# Patient Record
Sex: Female | Born: 1950 | Race: Black or African American | Hispanic: No | Marital: Married | Smoking: Former smoker
Health system: Southern US, Community
[De-identification: ages and names within clinical notes are randomized; demographics above are authoritative.]

## PROBLEM LIST (undated history)

## (undated) DIAGNOSIS — Z923 Personal history of irradiation: Secondary | ICD-10-CM

## (undated) DIAGNOSIS — I10 Essential (primary) hypertension: Secondary | ICD-10-CM

## (undated) DIAGNOSIS — N6091 Unspecified benign mammary dysplasia of right breast: Secondary | ICD-10-CM

## (undated) DIAGNOSIS — E119 Type 2 diabetes mellitus without complications: Secondary | ICD-10-CM

## (undated) DIAGNOSIS — C801 Malignant (primary) neoplasm, unspecified: Secondary | ICD-10-CM

## (undated) DIAGNOSIS — Z8719 Personal history of other diseases of the digestive system: Secondary | ICD-10-CM

## (undated) DIAGNOSIS — R51 Headache: Secondary | ICD-10-CM

## (undated) DIAGNOSIS — R519 Headache, unspecified: Secondary | ICD-10-CM

## (undated) DIAGNOSIS — D369 Benign neoplasm, unspecified site: Secondary | ICD-10-CM

## (undated) DIAGNOSIS — N8500 Endometrial hyperplasia, unspecified: Secondary | ICD-10-CM

## (undated) DIAGNOSIS — M1990 Unspecified osteoarthritis, unspecified site: Secondary | ICD-10-CM

## (undated) DIAGNOSIS — K635 Polyp of colon: Secondary | ICD-10-CM

## (undated) DIAGNOSIS — K760 Fatty (change of) liver, not elsewhere classified: Secondary | ICD-10-CM

## (undated) DIAGNOSIS — K219 Gastro-esophageal reflux disease without esophagitis: Secondary | ICD-10-CM

## (undated) DIAGNOSIS — Z9889 Other specified postprocedural states: Secondary | ICD-10-CM

## (undated) HISTORY — DX: Personal history of other diseases of the digestive system: Z87.19

## (undated) HISTORY — DX: Type 2 diabetes mellitus without complications: E11.9

## (undated) HISTORY — DX: Polyp of colon: K63.5

## (undated) HISTORY — PX: DILATION AND CURETTAGE OF UTERUS: SHX78

## (undated) HISTORY — DX: Unspecified benign mammary dysplasia of right breast: N60.91

## (undated) HISTORY — DX: Other specified postprocedural states: Z98.890

## (undated) HISTORY — DX: Benign neoplasm, unspecified site: D36.9

---

## 2006-12-25 DIAGNOSIS — Z8719 Personal history of other diseases of the digestive system: Secondary | ICD-10-CM

## 2006-12-25 DIAGNOSIS — K635 Polyp of colon: Secondary | ICD-10-CM

## 2006-12-25 HISTORY — PX: COLONOSCOPY: SHX174

## 2006-12-25 HISTORY — DX: Personal history of other diseases of the digestive system: Z87.19

## 2006-12-25 HISTORY — DX: Polyp of colon: K63.5

## 2007-07-25 LAB — HM COLONOSCOPY

## 2008-06-25 ENCOUNTER — Other Ambulatory Visit: Admission: RE | Admit: 2008-06-25 | Discharge: 2008-06-25 | Payer: Self-pay | Admitting: Obstetrics and Gynecology

## 2008-07-08 ENCOUNTER — Encounter: Admission: RE | Admit: 2008-07-08 | Discharge: 2008-07-08 | Payer: Self-pay | Admitting: Obstetrics and Gynecology

## 2010-04-07 ENCOUNTER — Ambulatory Visit: Payer: Self-pay | Admitting: Family Medicine

## 2011-05-04 ENCOUNTER — Ambulatory Visit: Payer: Self-pay

## 2011-12-26 DIAGNOSIS — N8500 Endometrial hyperplasia, unspecified: Secondary | ICD-10-CM

## 2011-12-26 HISTORY — DX: Endometrial hyperplasia, unspecified: N85.00

## 2012-04-23 LAB — HM DIABETES EYE EXAM

## 2012-05-06 ENCOUNTER — Ambulatory Visit: Payer: Self-pay | Admitting: Obstetrics and Gynecology

## 2012-07-24 LAB — HM PAP SMEAR: HM Pap smear: NORMAL

## 2012-07-24 LAB — HM MAMMOGRAPHY: HM Mammogram: NORMAL

## 2012-12-24 LAB — HM DIABETES FOOT EXAM

## 2013-02-24 ENCOUNTER — Ambulatory Visit: Payer: Self-pay | Admitting: Internal Medicine

## 2013-06-23 LAB — HEMOGLOBIN A1C: Hgb A1c MFr Bld: 6.5 % — AB (ref 4.0–6.0)

## 2013-07-12 LAB — HM MAMMOGRAPHY: HM Mammogram: NORMAL

## 2013-07-24 ENCOUNTER — Encounter: Payer: Self-pay | Admitting: Gastroenterology

## 2013-07-24 ENCOUNTER — Encounter: Payer: Self-pay | Admitting: *Deleted

## 2013-07-24 ENCOUNTER — Encounter: Payer: Self-pay | Admitting: Internal Medicine

## 2013-07-24 ENCOUNTER — Ambulatory Visit (INDEPENDENT_AMBULATORY_CARE_PROVIDER_SITE_OTHER): Payer: No Typology Code available for payment source | Admitting: Internal Medicine

## 2013-07-24 VITALS — BP 120/60 | HR 77 | Temp 98.1°F | Ht 62.5 in | Wt 211.0 lb

## 2013-07-24 DIAGNOSIS — E669 Obesity, unspecified: Secondary | ICD-10-CM

## 2013-07-24 DIAGNOSIS — R0683 Snoring: Secondary | ICD-10-CM

## 2013-07-24 DIAGNOSIS — M722 Plantar fascial fibromatosis: Secondary | ICD-10-CM | POA: Insufficient documentation

## 2013-07-24 DIAGNOSIS — E1121 Type 2 diabetes mellitus with diabetic nephropathy: Secondary | ICD-10-CM | POA: Insufficient documentation

## 2013-07-24 DIAGNOSIS — R0989 Other specified symptoms and signs involving the circulatory and respiratory systems: Secondary | ICD-10-CM

## 2013-07-24 DIAGNOSIS — R1314 Dysphagia, pharyngoesophageal phase: Secondary | ICD-10-CM

## 2013-07-24 DIAGNOSIS — Z9889 Other specified postprocedural states: Secondary | ICD-10-CM

## 2013-07-24 DIAGNOSIS — M791 Myalgia, unspecified site: Secondary | ICD-10-CM | POA: Insufficient documentation

## 2013-07-24 DIAGNOSIS — I10 Essential (primary) hypertension: Secondary | ICD-10-CM

## 2013-07-24 DIAGNOSIS — IMO0001 Reserved for inherently not codable concepts without codable children: Secondary | ICD-10-CM

## 2013-07-24 DIAGNOSIS — E785 Hyperlipidemia, unspecified: Secondary | ICD-10-CM | POA: Insufficient documentation

## 2013-07-24 DIAGNOSIS — R0609 Other forms of dyspnea: Secondary | ICD-10-CM

## 2013-07-24 DIAGNOSIS — E119 Type 2 diabetes mellitus without complications: Secondary | ICD-10-CM

## 2013-07-24 DIAGNOSIS — Z8719 Personal history of other diseases of the digestive system: Secondary | ICD-10-CM

## 2013-07-24 DIAGNOSIS — T466X5A Adverse effect of antihyperlipidemic and antiarteriosclerotic drugs, initial encounter: Secondary | ICD-10-CM | POA: Insufficient documentation

## 2013-07-24 LAB — COMPREHENSIVE METABOLIC PANEL
ALT: 13 U/L (ref 0–35)
AST: 16 U/L (ref 0–37)
Albumin: 3.9 g/dL (ref 3.5–5.2)
Alkaline Phosphatase: 97 U/L (ref 39–117)
BUN: 21 mg/dL (ref 6–23)
CO2: 30 mEq/L (ref 19–32)
Calcium: 10 mg/dL (ref 8.4–10.5)
Chloride: 103 mEq/L (ref 96–112)
Creatinine, Ser: 1 mg/dL (ref 0.4–1.2)
GFR: 75.46 mL/min (ref >60.00)
Glucose, Bld: 100 mg/dL — ABNORMAL HIGH (ref 70–99)
Potassium: 4.5 mEq/L (ref 3.5–5.1)
Sodium: 139 mEq/L (ref 135–145)
Total Bilirubin: 0.4 mg/dL (ref 0.3–1.2)
Total Protein: 7.1 g/dL (ref 6.0–8.3)

## 2013-07-24 LAB — CK: Total CK: 72 U/L (ref 7–177)

## 2013-07-24 LAB — HEMOGLOBIN A1C: Hgb A1c MFr Bld: 6.6 % — ABNORMAL HIGH (ref 4.6–6.5)

## 2013-07-24 LAB — LIPID PANEL
Cholesterol: 169 mg/dL (ref 0–200)
HDL: 58.9 mg/dL (ref >39.00)
LDL Cholesterol: 95 mg/dL (ref 0–99)
Total CHOL/HDL Ratio: 3
Triglycerides: 78 mg/dL (ref 0.0–149.0)
VLDL: 15.6 mg/dL (ref 0.0–40.0)

## 2013-07-24 MED ORDER — METFORMIN HCL 500 MG PO TABS: 500.0000 mg | ORAL_TABLET | Freq: Two times a day (BID) | ORAL | Status: DC

## 2013-07-24 MED ORDER — LISINOPRIL-HYDROCHLOROTHIAZIDE 20-12.5 MG PO TABS: 1.0000 | ORAL_TABLET | Freq: Every day | ORAL | Status: DC

## 2013-07-24 MED ORDER — SIMVASTATIN 40 MG PO TABS: 40.0000 mg | ORAL_TABLET | Freq: Every evening | ORAL | Status: DC

## 2013-07-24 NOTE — Assessment & Plan Note (Signed)
Pt reports good control of BG. Will check A1c with labs today. Continue Metformin. Eye exam and foot exam UTD.

## 2013-07-24 NOTE — Assessment & Plan Note (Signed)
Lipids are well controlled on simvastatin. We'll continue.

## 2013-07-24 NOTE — Assessment & Plan Note (Signed)
Symptoms of plantar fasciitis in the right foot persistent despite conservative measures including nonsteroidal medications and icing her foot. Will set up podiatry evaluation. Question if she might benefit from steroid injection.

## 2013-07-24 NOTE — Assessment & Plan Note (Signed)
BP Readings from Last 3 Encounters:  07/24/13 120/60   Blood pressure well-controlled on current medication. Will continue.

## 2013-07-24 NOTE — Assessment & Plan Note (Signed)
Body mass index is 37.95 kg/(m^2).  Discussed healthy diet and increasing physical activity with goal of 30-40 min 3 days per week. Activity currently limited by foot pain. Encouraged water-based activity.

## 2013-07-24 NOTE — Patient Instructions (Signed)

## 2013-07-24 NOTE — Assessment & Plan Note (Signed)
Persistent symptoms of dysphasia despite esophageal dilation several years ago. Previous dilation was performed in [place]. Will set her up with local GI physician. Question if she will need repeat EGD and possibly repeat esophageal dilation.

## 2013-07-24 NOTE — Progress Notes (Signed)
Subjective:    Patient ID: Martha [name], female    DOB: 1951/10/27, 62 y.o.   MRN: [id number]  HPI 62 year old female with history of diabetes, hypertension, hyperlipidemia, plantar fasciitis presents to establish care. In regards to diabetes, she reports blood sugars have been well-controlled typically between 90 and 100 fasting. Recent A1c checked by her former physician was 6.5%. She is compliant with metformin. She typically takes her medication only once per day.  Her primary concern today is ongoing right heel pain. This was diagnosed as plantar fasciitis. She has been taking Naprosyn and icing her foot with no improvement. The pain in her foot is described as a sharp knifelike pain that occurs with any weightbearing. It limits her ability to exercise.  She also notes occasional aching in the joints of her knees and shoulders. This seems to be worse with physical activity. She does not take any medication on a regular basis for this.  Outpatient Encounter Prescriptions as of 07/24/2013  Medication Sig Dispense Refill  . aspirin 81 MG tablet Take 81 mg by mouth daily.      . Cholecalciferol (VITAMIN D-3 PO) Take 2,000 Int'l Units by mouth daily.      . fluticasone (FLONASE) 50 MCG/ACT nasal spray       . lisinopril-hydrochlorothiazide (PRINZIDE,ZESTORETIC) 20-12.5 MG per tablet Take 1 tablet by mouth daily.  90 tablet  4  . metFORMIN (GLUCOPHAGE) 500 MG tablet Take 1 tablet (500 mg total) by mouth 2 (two) times daily with a meal.  180 tablet  4  . Naphazoline-Glycerin (REDNESS RELIEF OP) Apply to eye.      . naproxen (NAPROSYN) 500 MG tablet Take 500 mg by mouth 2 (two) times daily with a meal.      . Phenylephrine-Acetaminophen (SUDAFED PE PRESSURE + PAIN) 5-325 MG TABS Take by mouth.      . simvastatin (ZOCOR) 40 MG tablet Take 1 tablet (40 mg total) by mouth every evening.  90 tablet  4  . TRIAMCINOLONE ACETONIDE, TOP, 0.05 % OINT Apply topically.       No facility-administered  encounter medications on file as of 07/24/2013.   BP 120/60  Pulse 77  Temp(Src) 98.1 F (36.7 C) (Oral)  Ht 5' 2.5" (1.588 m)  Wt 211 lb (95.709 kg)  BMI 37.95 kg/m2  SpO2 97%  Review of Systems  Constitutional: Negative for fever, chills, appetite change, fatigue and unexpected weight change.  HENT: Negative for ear pain, congestion, sore throat, trouble swallowing, neck pain, voice change and sinus pressure.   Eyes: Negative for visual disturbance.  Respiratory: Negative for cough, shortness of breath, wheezing and stridor.   Cardiovascular: Negative for chest pain, palpitations and leg swelling.  Gastrointestinal: Negative for nausea, vomiting, abdominal pain, diarrhea, constipation, blood in stool, abdominal distention and anal bleeding.  Genitourinary: Negative for dysuria and flank pain.  Musculoskeletal: Positive for myalgias and arthralgias. Negative for gait problem.  Skin: Negative for color change and rash.  Neurological: Negative for dizziness and headaches.  Hematological: Negative for adenopathy. Does not bruise/bleed easily.  Psychiatric/Behavioral: Negative for suicidal ideas, sleep disturbance and dysphoric mood. The patient is not nervous/anxious.        Objective:   Physical Exam  Constitutional: She is oriented to person, place, and time. She appears well-developed and well-nourished. No distress.  HENT:  Head: Normocephalic and atraumatic.  Right Ear: External ear normal.  Left Ear: External ear normal.  Nose: Nose normal.  Mouth/Throat: Oropharynx is clear and moist.  No oropharyngeal exudate.  Eyes: Conjunctivae are normal. Pupils are equal, round, and reactive to light. Right eye exhibits no discharge. Left eye exhibits no discharge. No scleral icterus.  Neck: Normal range of motion. Neck supple. No tracheal deviation present. No thyromegaly present.  Cardiovascular: Normal rate, regular rhythm, normal heart sounds and intact distal pulses.  Exam reveals  no gallop and no friction rub.   No murmur heard. Pulmonary/Chest: Effort normal and breath sounds normal. No accessory muscle usage. Not tachypneic. No respiratory distress. She has no decreased breath sounds. She has no wheezes. She has no rhonchi. She has no rales. She exhibits no tenderness.  Musculoskeletal: Normal range of motion. She exhibits no edema and no tenderness.       Right foot: She exhibits tenderness. She exhibits normal range of motion and no bony tenderness.  Lymphadenopathy:    She has no cervical adenopathy.  Neurological: She is alert and oriented to person, place, and time. No cranial nerve deficit. She exhibits normal muscle tone. Coordination normal.  Skin: Skin is warm and dry. No rash noted. She is not diaphoretic. No erythema. No pallor.  Psychiatric: She has a normal mood and affect. Her behavior is normal. Judgment and thought content normal.          Assessment & Plan:

## 2013-07-24 NOTE — Assessment & Plan Note (Signed)
Reason aching in knees and shoulders. Symptoms are most consistent with osteoarthritis. Encouraged increased physical activity such as water aerobics. CK check today was normal.

## 2013-07-24 NOTE — Assessment & Plan Note (Signed)
Patient reports that her husband has noted that she snores. She also notes some daytime fatigue. Will set up sleep study to evaluate for sleep apnea.

## 2013-07-31 ENCOUNTER — Ambulatory Visit: Payer: Self-pay | Admitting: Family Medicine

## 2013-08-13 ENCOUNTER — Other Ambulatory Visit: Payer: Self-pay | Admitting: *Deleted

## 2013-08-13 DIAGNOSIS — I10 Essential (primary) hypertension: Secondary | ICD-10-CM

## 2013-08-13 DIAGNOSIS — E119 Type 2 diabetes mellitus without complications: Secondary | ICD-10-CM

## 2013-08-13 DIAGNOSIS — E785 Hyperlipidemia, unspecified: Secondary | ICD-10-CM

## 2013-08-13 MED ORDER — SIMVASTATIN 40 MG PO TABS: 40.0000 mg | ORAL_TABLET | Freq: Every evening | ORAL | Status: DC

## 2013-08-13 MED ORDER — LISINOPRIL-HYDROCHLOROTHIAZIDE 20-12.5 MG PO TABS: 1.0000 | ORAL_TABLET | Freq: Every day | ORAL | Status: DC

## 2013-08-13 MED ORDER — METFORMIN HCL 500 MG PO TABS: 500.0000 mg | ORAL_TABLET | Freq: Two times a day (BID) | ORAL | Status: DC

## 2013-08-13 NOTE — Telephone Encounter (Signed)
Eprescribed.

## 2013-08-19 ENCOUNTER — Ambulatory Visit: Payer: No Typology Code available for payment source | Admitting: Internal Medicine

## 2013-08-21 ENCOUNTER — Ambulatory Visit: Payer: No Typology Code available for payment source | Admitting: Gastroenterology

## 2014-02-08 ENCOUNTER — Other Ambulatory Visit: Payer: Self-pay | Admitting: Internal Medicine

## 2014-05-09 ENCOUNTER — Other Ambulatory Visit: Payer: Self-pay | Admitting: Internal Medicine

## 2014-05-11 NOTE — Telephone Encounter (Signed)
Spoke with pt on need for appt, scheduled 30 min visit on 06/12/14.

## 2014-06-12 ENCOUNTER — Ambulatory Visit (INDEPENDENT_AMBULATORY_CARE_PROVIDER_SITE_OTHER): Payer: No Typology Code available for payment source | Admitting: Internal Medicine

## 2014-06-12 ENCOUNTER — Encounter: Payer: Self-pay | Admitting: Internal Medicine

## 2014-06-12 VITALS — BP 106/60 | HR 73 | Temp 98.3°F | Wt 209.8 lb

## 2014-06-12 DIAGNOSIS — E669 Obesity, unspecified: Secondary | ICD-10-CM

## 2014-06-12 DIAGNOSIS — I10 Essential (primary) hypertension: Secondary | ICD-10-CM

## 2014-06-12 DIAGNOSIS — R0683 Snoring: Secondary | ICD-10-CM

## 2014-06-12 DIAGNOSIS — R0989 Other specified symptoms and signs involving the circulatory and respiratory systems: Secondary | ICD-10-CM

## 2014-06-12 DIAGNOSIS — E119 Type 2 diabetes mellitus without complications: Secondary | ICD-10-CM

## 2014-06-12 DIAGNOSIS — E785 Hyperlipidemia, unspecified: Secondary | ICD-10-CM

## 2014-06-12 DIAGNOSIS — R0609 Other forms of dyspnea: Secondary | ICD-10-CM

## 2014-06-12 LAB — COMPREHENSIVE METABOLIC PANEL
ALT: 13 U/L (ref 0–35)
AST: 19 U/L (ref 0–37)
Albumin: 4.1 g/dL (ref 3.5–5.2)
Alkaline Phosphatase: 92 U/L (ref 39–117)
BUN: 18 mg/dL (ref 6–23)
CO2: 29 mEq/L (ref 19–32)
Calcium: 9.5 mg/dL (ref 8.4–10.5)
Chloride: 104 mEq/L (ref 96–112)
Creatinine, Ser: 0.9 mg/dL (ref 0.4–1.2)
GFR: 80.04 mL/min (ref >60.00)
Glucose, Bld: 112 mg/dL — ABNORMAL HIGH (ref 70–99)
Potassium: 4.4 mEq/L (ref 3.5–5.1)
Sodium: 139 mEq/L (ref 135–145)
Total Bilirubin: 0.5 mg/dL (ref 0.2–1.2)
Total Protein: 7.1 g/dL (ref 6.0–8.3)

## 2014-06-12 LAB — LIPID PANEL
Cholesterol: 153 mg/dL (ref 0–200)
HDL: 59.5 mg/dL (ref >39.00)
LDL Cholesterol: 74 mg/dL (ref 0–99)
NonHDL: 93.5
Total CHOL/HDL Ratio: 3
Triglycerides: 98 mg/dL (ref 0.0–149.0)
VLDL: 19.6 mg/dL (ref 0.0–40.0)

## 2014-06-12 LAB — MICROALBUMIN / CREATININE URINE RATIO
Creatinine,U: 203.1 mg/dL
Microalb Creat Ratio: 3.2 mg/g (ref 0.0–30.0)
Microalb, Ur: 6.4 mg/dL — ABNORMAL HIGH (ref 0.0–1.9)

## 2014-06-12 LAB — HM DIABETES FOOT EXAM: HM Diabetic Foot Exam: NORMAL

## 2014-06-12 LAB — HEMOGLOBIN A1C: Hgb A1c MFr Bld: 6.6 % — ABNORMAL HIGH (ref 4.6–6.5)

## 2014-06-12 MED ORDER — GLUCOSE BLOOD VI STRP: ORAL_STRIP | Status: DC

## 2014-06-12 MED ORDER — SIMVASTATIN 40 MG PO TABS: 40.0000 mg | ORAL_TABLET | Freq: Every day | ORAL | Status: DC

## 2014-06-12 MED ORDER — LISINOPRIL-HYDROCHLOROTHIAZIDE 20-12.5 MG PO TABS: 1.0000 | ORAL_TABLET | Freq: Every day | ORAL | Status: DC

## 2014-06-12 MED ORDER — METFORMIN HCL 500 MG PO TABS: 500.0000 mg | ORAL_TABLET | Freq: Two times a day (BID) | ORAL | Status: DC

## 2014-06-12 NOTE — Assessment & Plan Note (Signed)
Will check lipids and LFTs with labs today. Continue simvastatin. 

## 2014-06-12 NOTE — Assessment & Plan Note (Signed)
Wt Readings from Last 3 Encounters:  06/12/14 209 lb 12 oz (95.142 kg)  07/24/13 211 lb (95.709 kg)   Body mass index is 37.73 kg/(m^2). Encouraged healthy, Mediterranean style diet and exercise 70min 3x per week at minimum to help with weight loss.

## 2014-06-12 NOTE — Progress Notes (Signed)
Pre visit review using our clinic review tool, if applicable. No additional management support is needed unless otherwise documented below in the visit note. 

## 2014-06-12 NOTE — Assessment & Plan Note (Signed)
Did not follow up with sleep study because of cost. Has high-deductible plan and cannot afford sleep study.

## 2014-06-12 NOTE — Assessment & Plan Note (Signed)
Lab Results  Component Value Date   HGBA1C 6.6* 07/24/2013   Will check A1c with labs. Continue Metformin. Encouraged healthy, Mediteranean style diet and exercise 76min 3x per week.

## 2014-06-12 NOTE — Assessment & Plan Note (Signed)
BP Readings from Last 3 Encounters:  06/12/14 106/60  07/24/13 120/60   BP well controlled on Lisinopril-HCTZ. Will check renal function with labs.

## 2014-06-12 NOTE — Progress Notes (Signed)
Subjective:    Patient ID: Martha [name], female    DOB: 01/14/1951, 63 y.o.   MRN: [id number]  HPI 63YO female presents for follow up.  DM - BG typically 130s per report but did not bring record. Compliant with meds. Working on improving diet and exercising. Frustrated by difficulty losing weight.  HTN - Does not check BP. No recent chest pain, headache, palpitations. Compliant with meds.  Recent plantar fasciitis has resolved. No new concerns today.   Review of Systems  Constitutional: Negative for fever, chills, appetite change, fatigue and unexpected weight change.  Eyes: Negative for visual disturbance.  Respiratory: Negative for shortness of breath.   Cardiovascular: Negative for chest pain and leg swelling.  Gastrointestinal: Negative for abdominal pain.  Endocrine: Negative for polydipsia, polyphagia and polyuria.  Musculoskeletal: Negative for arthralgias and myalgias.  Skin: Negative for color change and rash.  Hematological: Negative for adenopathy. Does not bruise/bleed easily.  Psychiatric/Behavioral: Negative for dysphoric mood. The patient is not nervous/anxious.        Objective:    BP 106/60  Pulse 73  Temp(Src) 98.3 F (36.8 C) (Oral)  Wt 209 lb 12 oz (95.142 kg)  SpO2 97% Physical Exam  Constitutional: She is oriented to person, place, and time. She appears well-developed and well-nourished. No distress.  HENT:  Head: Normocephalic and atraumatic.  Right Ear: External ear normal.  Left Ear: External ear normal.  Nose: Nose normal.  Mouth/Throat: Oropharynx is clear and moist. No oropharyngeal exudate.  Eyes: Conjunctivae are normal. Pupils are equal, round, and reactive to light. Right eye exhibits no discharge. Left eye exhibits no discharge. No scleral icterus.  Neck: Normal range of motion. Neck supple. No tracheal deviation present. No thyromegaly present.  Cardiovascular: Normal rate, regular rhythm, normal heart sounds and intact distal  pulses.  Exam reveals no gallop and no friction rub.   No murmur heard. Pulmonary/Chest: Effort normal and breath sounds normal. No accessory muscle usage. Not tachypneic. No respiratory distress. She has no decreased breath sounds. She has no wheezes. She has no rhonchi. She has no rales. She exhibits no tenderness.  Abdominal: Soft. Bowel sounds are normal. She exhibits no distension and no mass. There is no tenderness. There is no rebound and no guarding.  Musculoskeletal: Normal range of motion. She exhibits no edema and no tenderness.  Lymphadenopathy:    She has no cervical adenopathy.  Neurological: She is alert and oriented to person, place, and time. No cranial nerve deficit. She exhibits normal muscle tone. Coordination normal.  Skin: Skin is warm and dry. No rash noted. She is not diaphoretic. No erythema. No pallor.  Psychiatric: She has a normal mood and affect. Her behavior is normal. Judgment and thought content normal.          Assessment & Plan:   Problem List Items Addressed This Visit     Unprioritized   Diabetes mellitus type 2, controlled - Primary      Lab Results  Component Value Date   HGBA1C 6.6* 07/24/2013   Will check A1c with labs. Continue Metformin. Encouraged healthy, Mediteranean style diet and exercise 46min 3x per week.    Relevant Medications      glucose blood (BL TEST STRIP PACK) test strip      metFORMIN (GLUCOPHAGE) tablet      lisinopril-hydrochlorothiazide (PRINZIDE,ZESTORETIC) 20-12.5 MG per tablet      simvastatin (ZOCOR) tablet   Other Relevant Orders      Comprehensive metabolic panel  Hemoglobin A1c      Microalbumin / creatinine urine ratio   Essential hypertension, benign      BP Readings from Last 3 Encounters:  06/12/14 106/60  07/24/13 120/60   BP well controlled on Lisinopril-HCTZ. Will check renal function with labs.    Relevant Medications      lisinopril-hydrochlorothiazide (PRINZIDE,ZESTORETIC) 20-12.5 MG per  tablet      simvastatin (ZOCOR) tablet   Obesity, unspecified      Wt Readings from Last 3 Encounters:  06/12/14 209 lb 12 oz (95.142 kg)  07/24/13 211 lb (95.709 kg)   Body mass index is 37.73 kg/(m^2). Encouraged healthy, Mediterranean style diet and exercise 60min 3x per week at minimum to help with weight loss.    Relevant Medications      metFORMIN (GLUCOPHAGE) tablet   Other and unspecified hyperlipidemia     Will check lipids and LFTs with labs today. Continue simvastatin.    Relevant Medications      lisinopril-hydrochlorothiazide (PRINZIDE,ZESTORETIC) 20-12.5 MG per tablet      simvastatin (ZOCOR) tablet   Other Relevant Orders      Lipid panel   Snoring     Did not follow up with sleep study because of cost. Has high-deductible plan and cannot afford sleep study.        Return in about 4 weeks (around 07/10/2014) for Physical with PAP.

## 2014-06-13 ENCOUNTER — Telehealth: Payer: Self-pay | Admitting: Internal Medicine

## 2014-06-13 NOTE — Telephone Encounter (Signed)
Relevant patient education assigned to patient using Emmi. ° °

## 2014-06-15 ENCOUNTER — Encounter: Payer: Self-pay | Admitting: *Deleted

## 2014-06-17 LAB — HM DIABETES EYE EXAM

## 2014-07-01 ENCOUNTER — Other Ambulatory Visit: Payer: Self-pay | Admitting: *Deleted

## 2014-07-01 ENCOUNTER — Telehealth: Payer: Self-pay | Admitting: Internal Medicine

## 2014-07-01 MED ORDER — GLUCOSE BLOOD VI STRP: ORAL_STRIP | Status: DC

## 2014-07-01 MED ORDER — FREESTYLE SYSTEM KIT: 1.0000 | PACK | Status: DC | PRN

## 2014-07-01 NOTE — Telephone Encounter (Signed)
Needing a prescription called into the pharmacy for a freestyle meter.

## 2014-07-01 NOTE — Telephone Encounter (Signed)
Rx sent 

## 2014-07-17 ENCOUNTER — Encounter: Payer: Self-pay | Admitting: Internal Medicine

## 2014-07-17 ENCOUNTER — Ambulatory Visit (INDEPENDENT_AMBULATORY_CARE_PROVIDER_SITE_OTHER): Payer: No Typology Code available for payment source | Admitting: Internal Medicine

## 2014-07-17 VITALS — BP 132/62 | HR 79 | Temp 98.5°F | Ht 62.5 in | Wt 209.8 lb

## 2014-07-17 DIAGNOSIS — E669 Obesity, unspecified: Secondary | ICD-10-CM

## 2014-07-17 DIAGNOSIS — Z0000 Encounter for general adult medical examination without abnormal findings: Secondary | ICD-10-CM | POA: Insufficient documentation

## 2014-07-17 NOTE — Assessment & Plan Note (Signed)
General medical exam normal today including breast and pelvic exam. PAP pending. Encouraged healthy diet, exercise with goal of 42min 3x per week. Immunizations are UTD(pt is checking on previous Zostavax). Labs reviewed including CMP, lipids, A1c.

## 2014-07-17 NOTE — Patient Instructions (Signed)

## 2014-07-17 NOTE — Progress Notes (Signed)
Pre visit review using our clinic review tool, if applicable. No additional management support is needed unless otherwise documented below in the visit note. 

## 2014-07-17 NOTE — Assessment & Plan Note (Signed)
Wt Readings from Last 3 Encounters:  07/17/14 209 lb 12 oz (95.142 kg)  06/12/14 209 lb 12 oz (95.142 kg)  07/24/13 211 lb (95.709 kg)   Body mass index is 37.73 kg/(m^2). Encouraged healthy diet and exercise with goal of 72min 3x per week.

## 2014-07-17 NOTE — Progress Notes (Signed)
Subjective:    Patient ID: Martha [name], female    DOB: 05-Dec-1951, 63 y.o.   MRN: [id number]  HPI 63YO female presents for annual exam. Feeling well. Working on improving diet and being more active. No concerns today.  Review of Systems  Constitutional: Negative for fever, chills, appetite change, fatigue and unexpected weight change.  Eyes: Negative for visual disturbance.  Respiratory: Negative for shortness of breath.   Cardiovascular: Negative for chest pain and leg swelling.  Gastrointestinal: Negative for nausea, vomiting, abdominal pain, diarrhea, constipation and rectal pain.  Genitourinary: Negative for urgency, frequency and pelvic pain.  Skin: Negative for color change and rash.  Hematological: Negative for adenopathy. Does not bruise/bleed easily.  Psychiatric/Behavioral: Negative for dysphoric mood. The patient is not nervous/anxious.        Objective:    BP 132/62  Pulse 79  Temp(Src) 98.5 F (36.9 C) (Oral)  Ht 5' 2.5" (1.588 m)  Wt 209 lb 12 oz (95.142 kg)  BMI 37.73 kg/m2  SpO2 96% Physical Exam  Constitutional: She is oriented to person, place, and time. She appears well-developed and well-nourished. No distress.  HENT:  Head: Normocephalic and atraumatic.  Right Ear: External ear normal.  Left Ear: External ear normal.  Nose: Nose normal.  Mouth/Throat: Oropharynx is clear and moist. No oropharyngeal exudate.  Eyes: Conjunctivae are normal. Pupils are equal, round, and reactive to light. Right eye exhibits no discharge. Left eye exhibits no discharge. No scleral icterus.  Neck: Normal range of motion. Neck supple. No tracheal deviation present. No thyromegaly present.  Cardiovascular: Normal rate, regular rhythm, normal heart sounds and intact distal pulses.  Exam reveals no gallop and no friction rub.   No murmur heard. Pulmonary/Chest: Effort normal and breath sounds normal. No respiratory distress. She has no wheezes. She has no rales. She  exhibits no tenderness.  Abdominal: Soft. Bowel sounds are normal. She exhibits no distension and no mass. There is no tenderness. There is no rebound and no guarding.  Genitourinary: Rectum normal, vagina normal and uterus normal. No breast swelling, tenderness, discharge or bleeding. Pelvic exam was performed with patient supine. There is no rash, tenderness or lesion on the right labia. There is no rash, tenderness or lesion on the left labia. Uterus is not enlarged and not tender. Cervix exhibits no motion tenderness, no discharge and no friability. Right adnexum displays no mass, no tenderness and no fullness. Left adnexum displays no mass, no tenderness and no fullness. No erythema or tenderness around the vagina. No vaginal discharge found.  Musculoskeletal: Normal range of motion. She exhibits no edema and no tenderness.  Lymphadenopathy:    She has no cervical adenopathy.  Neurological: She is alert and oriented to person, place, and time. No cranial nerve deficit. She exhibits normal muscle tone. Coordination normal.  Skin: Skin is warm and dry. No rash noted. She is not diaphoretic. No erythema. No pallor.  Psychiatric: She has a normal mood and affect. Her behavior is normal. Judgment and thought content normal.          Assessment & Plan:   Problem List Items Addressed This Visit     Unprioritized   Obesity, unspecified      Wt Readings from Last 3 Encounters:  07/17/14 209 lb 12 oz (95.142 kg)  06/12/14 209 lb 12 oz (95.142 kg)  07/24/13 211 lb (95.709 kg)   Body mass index is 37.73 kg/(m^2). Encouraged healthy diet and exercise with goal of 59min 3x  per week.    Routine general medical examination at a health care facility - Primary     General medical exam normal today including breast and pelvic exam. PAP pending. Encouraged healthy diet, exercise with goal of 68min 3x per week. Immunizations are UTD(pt is checking on previous Zostavax). Labs reviewed including CMP,  lipids, A1c.        Return in about 3 months (around 10/17/2014) for Recheck of Diabetes.

## 2014-07-18 LAB — HM PAP SMEAR: HM Pap smear: NORMAL

## 2014-07-20 ENCOUNTER — Other Ambulatory Visit (HOSPITAL_COMMUNITY)
Admission: RE | Admit: 2014-07-20 | Discharge: 2014-07-20 | Disposition: A | Discharge status: Home / Self Care | Payer: No Typology Code available for payment source | Source: Ambulatory Visit | Attending: Internal Medicine | Admitting: Internal Medicine

## 2014-07-20 DIAGNOSIS — Z1151 Encounter for screening for human papillomavirus (HPV): Secondary | ICD-10-CM | POA: Insufficient documentation

## 2014-07-20 DIAGNOSIS — Z01419 Encounter for gynecological examination (general) (routine) without abnormal findings: Secondary | ICD-10-CM | POA: Insufficient documentation

## 2014-07-20 LAB — HM PAP SMEAR: HM Pap smear: NEGATIVE

## 2014-07-20 NOTE — Addendum Note (Signed)
Addended by: Karlene Einstein D on: 07/20/2014 10:44 AM   Modules accepted: Orders

## 2014-07-22 LAB — CYTOLOGY - PAP

## 2014-10-12 ENCOUNTER — Ambulatory Visit: Payer: Self-pay | Admitting: Internal Medicine

## 2014-10-12 ENCOUNTER — Encounter: Payer: Self-pay | Admitting: *Deleted

## 2014-10-12 LAB — HM MAMMOGRAPHY: HM Mammogram: NEGATIVE

## 2015-01-04 ENCOUNTER — Telehealth: Payer: Self-pay | Admitting: *Deleted

## 2015-01-04 NOTE — Telephone Encounter (Signed)
I would recommend that she go to urgent care this afternoon, as we have no openings here.

## 2015-01-04 NOTE — Telephone Encounter (Signed)
Nurse Assessment Nurse: Loletta Specter, RN, Wells Guiles Date/Time Eilene Ghazi Time): 01/04/2015 12:24:51 PM Confirm and document reason for call. If symptomatic, describe symptoms. ---Caller states she has a chest cold and now it is in her head. She has been running a low grade temp. Pt has a cough, temp last night 99. Unknown this morning, denies SOB but can hear wheeze. Has the patient traveled out of the country within the last 30 days? ---Not Applicable Does the patient require triage? ---Yes Related visit to physician within the last 2 weeks? ---No Does the PT have any chronic conditions? (i.e. diabetes, asthma, etc.) ---No Guidelines Guideline Title Affirmed Question Affirmed Notes Nurse Date/Time Eilene Ghazi Time) Cough - Acute Productive Wheezing is present Loletta Specter, Automotive engineer 01/04/2015 12:26:13 PM Disp. Time Eilene Ghazi Time) Disposition Final User 01/04/2015 12:56:40 PM See Physician within 4 Hours (or PCP triage) Yes Loletta Specter, RN, Romualdo Bolk Understands: Yes Disagree/Comply: Comply Care Advice Given Per Guideline SEE PHYSICIAN WITHIN 4 HOURS (or PCP triage): * IF NO PCP TRIAGE: You need to be seen. Go to _______________ ([facility]/ [facility] or office if it will be open) within the next 3 or 4 hours. Go sooner if you become worse. CARE ADVICE given per Cough - Acute Productive (Adult) guideline. CALL BACK IF: * You become worse.

## 2015-01-04 NOTE — Telephone Encounter (Signed)
Pt notified and  verbalized understanding to be seen in [facility] or [facility]

## 2015-01-18 ENCOUNTER — Encounter: Payer: Self-pay | Admitting: Internal Medicine

## 2015-01-18 ENCOUNTER — Ambulatory Visit (INDEPENDENT_AMBULATORY_CARE_PROVIDER_SITE_OTHER): Payer: 59 | Admitting: Internal Medicine

## 2015-01-18 VITALS — BP 104/67 | HR 71 | Temp 97.9°F | Ht 62.5 in

## 2015-01-18 DIAGNOSIS — E785 Hyperlipidemia, unspecified: Secondary | ICD-10-CM

## 2015-01-18 DIAGNOSIS — I10 Essential (primary) hypertension: Secondary | ICD-10-CM

## 2015-01-18 DIAGNOSIS — E119 Type 2 diabetes mellitus without complications: Secondary | ICD-10-CM

## 2015-01-18 LAB — COMPREHENSIVE METABOLIC PANEL
ALT: 11 U/L (ref 0–35)
AST: 15 U/L (ref 0–37)
Albumin: 3.9 g/dL (ref 3.5–5.2)
Alkaline Phosphatase: 101 U/L (ref 39–117)
BUN: 19 mg/dL (ref 6–23)
CO2: 30 mEq/L (ref 19–32)
Calcium: 9.6 mg/dL (ref 8.4–10.5)
Chloride: 104 mEq/L (ref 96–112)
Creatinine, Ser: 1 mg/dL (ref 0.40–1.20)
GFR: 71.65 mL/min (ref >60.00)
Glucose, Bld: 136 mg/dL — ABNORMAL HIGH (ref 70–99)
Potassium: 4.3 mEq/L (ref 3.5–5.1)
Sodium: 139 mEq/L (ref 135–145)
Total Bilirubin: 0.3 mg/dL (ref 0.2–1.2)
Total Protein: 7.1 g/dL (ref 6.0–8.3)

## 2015-01-18 LAB — LIPID PANEL
Cholesterol: 150 mg/dL (ref 0–200)
HDL: 53.9 mg/dL (ref >39.00)
LDL Cholesterol: 68 mg/dL (ref 0–99)
NonHDL: 96.1
Total CHOL/HDL Ratio: 3
Triglycerides: 140 mg/dL (ref 0.0–149.0)
VLDL: 28 mg/dL (ref 0.0–40.0)

## 2015-01-18 LAB — MICROALBUMIN / CREATININE URINE RATIO
Creatinine,U: 286.8 mg/dL
Microalb Creat Ratio: 1.8 mg/g (ref 0.0–30.0)
Microalb, Ur: 5.3 mg/dL — ABNORMAL HIGH (ref 0.0–1.9)

## 2015-01-18 LAB — HEMOGLOBIN A1C: Hgb A1c MFr Bld: 6.9 % — ABNORMAL HIGH (ref 4.6–6.5)

## 2015-01-18 NOTE — Assessment & Plan Note (Signed)
Will check lipids and LFTs with labs today. 

## 2015-01-18 NOTE — Patient Instructions (Signed)
Labs today.  Follow up in 6 months. 

## 2015-01-18 NOTE — Progress Notes (Signed)
Pre visit review using our clinic review tool, if applicable. No additional management support is needed unless otherwise documented below in the visit note. 

## 2015-01-18 NOTE — Progress Notes (Signed)
   Subjective:    Patient ID: Martha [name], female    DOB: Aug 31, 1951, 64 y.o.   MRN: [id number]  HPI 64YO female presents for follow up.  DM - Does not check BG. Compliant with medications.  Recently getting over a cold. Has some congestion which is improving.   Past medical, surgical, family and social history per today's encounter.  Review of Systems  Constitutional: Negative for fever, chills, appetite change, fatigue and unexpected weight change.  Eyes: Negative for visual disturbance.  Respiratory: Negative for shortness of breath.   Cardiovascular: Negative for chest pain and leg swelling.  Gastrointestinal: Negative for nausea, vomiting, abdominal pain, diarrhea and constipation.  Musculoskeletal: Negative for myalgias and arthralgias.  Skin: Negative for color change and rash.  Hematological: Negative for adenopathy. Does not bruise/bleed easily.  Psychiatric/Behavioral: Negative for dysphoric mood. The patient is not nervous/anxious.        Objective:    BP 104/67 mmHg  Pulse 71  Temp(Src) 97.9 F (36.6 C) (Oral)  Ht 5' 2.5" (1.588 m)  SpO2 98% Physical Exam  Constitutional: She is oriented to person, place, and time. She appears well-developed and well-nourished. No distress.  HENT:  Head: Normocephalic and atraumatic.  Right Ear: External ear normal.  Left Ear: External ear normal.  Nose: Nose normal.  Mouth/Throat: Oropharynx is clear and moist. No oropharyngeal exudate.  Eyes: Conjunctivae are normal. Pupils are equal, round, and reactive to light. Right eye exhibits no discharge. Left eye exhibits no discharge. No scleral icterus.  Neck: Normal range of motion. Neck supple. No tracheal deviation present. No thyromegaly present.  Cardiovascular: Normal rate, regular rhythm, normal heart sounds and intact distal pulses.  Exam reveals no gallop and no friction rub.   No murmur heard. Pulmonary/Chest: Effort normal and breath sounds normal. No accessory  muscle usage. No tachypnea. No respiratory distress. She has no decreased breath sounds. She has no wheezes. She has no rhonchi. She has no rales. She exhibits no tenderness.  Musculoskeletal: Normal range of motion. She exhibits no edema or tenderness.  Lymphadenopathy:    She has no cervical adenopathy.  Neurological: She is alert and oriented to person, place, and time. No cranial nerve deficit. She exhibits normal muscle tone. Coordination normal.  Skin: Skin is warm and dry. No rash noted. She is not diaphoretic. No erythema. No pallor.  Psychiatric: She has a normal mood and affect. Her behavior is normal. Judgment and thought content normal.          Assessment & Plan:   Problem List Items Addressed This Visit      Unprioritized   Diabetes mellitus type 2, controlled - Primary    Will check A1c with labs today. Continue Metformin.      Relevant Orders   Comprehensive metabolic panel   Hemoglobin A1c   Lipid panel   Microalbumin / creatinine urine ratio   Essential hypertension, benign    BP Readings from Last 3 Encounters:  01/18/15 104/67  07/17/14 132/62  06/12/14 106/60   BP well controlled on current medication. Renal function with labs today.      Hyperlipidemia    Will check lipids and LFTs with labs today.          Return in about 6 months (around 07/19/2015) for Physical.

## 2015-01-18 NOTE — Assessment & Plan Note (Signed)
BP Readings from Last 3 Encounters:  01/18/15 104/67  07/17/14 132/62  06/12/14 106/60   BP well controlled on current medication. Renal function with labs today.

## 2015-01-18 NOTE — Assessment & Plan Note (Signed)
Will check A1c with labs today. Continue Metformin. 

## 2015-01-21 ENCOUNTER — Encounter: Payer: Self-pay | Admitting: Internal Medicine

## 2015-01-27 ENCOUNTER — Other Ambulatory Visit: Payer: Self-pay | Admitting: *Deleted

## 2015-01-27 DIAGNOSIS — E119 Type 2 diabetes mellitus without complications: Secondary | ICD-10-CM

## 2015-01-27 DIAGNOSIS — E785 Hyperlipidemia, unspecified: Secondary | ICD-10-CM

## 2015-01-27 DIAGNOSIS — I10 Essential (primary) hypertension: Secondary | ICD-10-CM

## 2015-01-27 MED ORDER — LISINOPRIL-HYDROCHLOROTHIAZIDE 20-12.5 MG PO TABS: 1.0000 | ORAL_TABLET | Freq: Every day | ORAL | Status: DC

## 2015-01-27 MED ORDER — SIMVASTATIN 40 MG PO TABS: 40.0000 mg | ORAL_TABLET | Freq: Every day | ORAL | Status: DC

## 2015-01-27 MED ORDER — METFORMIN HCL 500 MG PO TABS: 500.0000 mg | ORAL_TABLET | Freq: Two times a day (BID) | ORAL | Status: DC

## 2015-04-09 ENCOUNTER — Encounter: Payer: Self-pay | Admitting: Internal Medicine

## 2015-04-12 ENCOUNTER — Encounter: Payer: Self-pay | Admitting: Nurse Practitioner

## 2015-04-12 ENCOUNTER — Ambulatory Visit (INDEPENDENT_AMBULATORY_CARE_PROVIDER_SITE_OTHER): Payer: 59 | Admitting: Nurse Practitioner

## 2015-04-12 VITALS — BP 124/64 | HR 91 | Temp 98.6°F | Resp 14 | Ht 62.5 in | Wt 205.8 lb

## 2015-04-12 DIAGNOSIS — J309 Allergic rhinitis, unspecified: Secondary | ICD-10-CM | POA: Diagnosis not present

## 2015-04-12 MED ORDER — GUAIFENESIN-CODEINE 100-10 MG/5ML PO SYRP: 5.0000 mL | ORAL_SOLUTION | Freq: Every day | ORAL | Status: DC

## 2015-04-12 NOTE — Progress Notes (Signed)
Pre visit review using our clinic review tool, if applicable. No additional management support is needed unless otherwise documented below in the visit note. 

## 2015-04-12 NOTE — Assessment & Plan Note (Addendum)
Probable allergies or viral component. Will try OTC allergy medication, flonase, prescription cheratussin AC, and debrox OTC for ears. Ear wax was irrigated out and TM was visible with no significant findings. FU prn worsening/failure to improve.

## 2015-04-12 NOTE — Patient Instructions (Signed)
5 mL (1 teaspoon) of the cough syrup at night (can take up to 3 times a day, but will make you very drowsy). Do not drive or operate heavy machinery until you know how this will affect you.   Your cough may be coming from reflux, allergies, or post nasal drip (PND).  PND and allergies can be treated with Allegra, Zyrtec or claritin.  Take the flonase as directed.

## 2015-04-12 NOTE — Progress Notes (Signed)
Subjective:    Patient ID: Martha [name], female    DOB: 07-26-1951, 64 y.o.   MRN: [id number]  HPI  Ms. Lanza is a 64 yo female with a CC of cough x 1 week.   1)  Started a week ago, sore throat and then went away on Sunday, cough worse on Saturday, feels it is going to chest, hears wheezing at night. Both ears itching, stopped up today and slight sharp pain in right ear, but then it went away after 2-3 seconds (1 episode today). Dry cough, clear rhinorrhea. No one else sick around her.   Treatment to date:  Coricidin HBP Gargging with listerine and salt water  Excedrin or aleve for headaches and joint aches   Not taking flonase No sudafed   Review of Systems  Constitutional: Positive for fatigue. Negative for fever, chills and diaphoresis.  HENT: Positive for congestion, ear discharge, postnasal drip, rhinorrhea and sinus pressure. Negative for sore throat.   Eyes: Positive for discharge and itching. Negative for visual disturbance.       Watery eyes  Respiratory: Positive for cough and wheezing. Negative for chest tightness and shortness of breath.   Gastrointestinal: Negative for nausea, vomiting and diarrhea.  Skin: Negative for rash.  Neurological: Positive for headaches.   Past Medical History  Diagnosis Date  . Diabetes mellitus without complication   . Status post dilation of esophageal narrowing 2008    History   Social History  . Marital Status: Married    Spouse Name: N/A  . Number of Children: N/A  . Years of Education: N/A   Occupational History  . Not on file.   Social History Main Topics  . Smoking status: Former Smoker    Quit date: 07/24/1990  . Smokeless tobacco: Never Used  . Alcohol Use: Yes     Comment: Socially  . Drug Use: No  . Sexual Activity: Not on file   Other Topics Concern  . Not on file   Social History Narrative   Lives in [place]. From [place]. Son lives with pt. Dog in home.      Work - [employer], and Theme park manager, now  retired.      Diet - regular      Exercise - no regular    Past Surgical History  Procedure Laterality Date  . Dilation and curettage of uterus      Family History  Problem Relation Age of Onset  . Hypertension Mother   . Heart disease Father 57  . Hypertension Daughter   . Cancer Maternal Aunt 80    breast and ovary    No Known Allergies  Current Outpatient Prescriptions on File Prior to Visit  Medication Sig Dispense Refill  . aspirin 81 MG tablet Take 81 mg by mouth daily.    . Cholecalciferol (VITAMIN D-3 PO) Take 2,000 Int'l Units by mouth daily.    . fluticasone (FLONASE) 50 MCG/ACT nasal spray     . glucose blood test strip Use as instructed with Freestyle Meter 100 each 6  . glucose monitoring kit (FREESTYLE) monitoring kit 1 each by Does not apply route as needed for other. 1 each 0  . lisinopril-hydrochlorothiazide (PRINZIDE,ZESTORETIC) 20-12.5 MG per tablet Take 1 tablet by mouth daily. 90 tablet 3  . metFORMIN (GLUCOPHAGE) 500 MG tablet Take 1 tablet (500 mg total) by mouth 2 (two) times daily with a meal. 180 tablet 3  . Naphazoline-Glycerin (REDNESS RELIEF OP) Apply to eye.    Marland Kitchen  naproxen (NAPROSYN) 500 MG tablet Take 500 mg by mouth 2 (two) times daily with a meal.    . Phenylephrine-Acetaminophen (SUDAFED PE PRESSURE + PAIN) 5-325 MG TABS Take by mouth.    . simvastatin (ZOCOR) 40 MG tablet Take 1 tablet (40 mg total) by mouth daily. 90 tablet 3  . TRIAMCINOLONE ACETONIDE, TOP, 0.05 % OINT Apply topically.     No current facility-administered medications on file prior to visit.       Objective:   Physical Exam  Constitutional: She is oriented to person, place, and time. She appears well-developed and well-nourished. No distress.  BP 124/64 mmHg  Pulse 91  Temp(Src) 98.6 F (37 C) (Oral)  Resp 14  Ht 5' 2.5" (1.588 m)  Wt 205 lb 12.8 oz (93.35 kg)  BMI 37.02 kg/m2  SpO2 96%   HENT:  Head: Normocephalic and atraumatic.  Right Ear: External ear  normal.  Left Ear: External ear normal.  Mouth/Throat: No oropharyngeal exudate.  Right TM blocked by cerumen.  Left TM clear   Eyes: EOM are normal. Pupils are equal, round, and reactive to light. Right eye exhibits no discharge. Left eye exhibits no discharge. No scleral icterus.  Neck: Normal range of motion. Neck supple.  Cardiovascular: Normal rate, regular rhythm and normal heart sounds.  Exam reveals no gallop and no friction rub.   No murmur heard. Pulmonary/Chest: Effort normal and breath sounds normal. No respiratory distress. She has no wheezes. She has no rales. She exhibits no tenderness.  Lymphadenopathy:    She has no cervical adenopathy.  Neurological: She is alert and oriented to person, place, and time. Coordination normal.  Skin: Skin is warm and dry. No rash noted. She is not diaphoretic.  Psychiatric: She has a normal mood and affect. Her behavior is normal. Judgment and thought content normal.      Assessment & Plan:

## 2015-06-07 LAB — HM DIABETES EYE EXAM

## 2015-06-21 ENCOUNTER — Encounter: Payer: Self-pay | Admitting: Internal Medicine

## 2015-06-21 ENCOUNTER — Ambulatory Visit (INDEPENDENT_AMBULATORY_CARE_PROVIDER_SITE_OTHER): Payer: 59 | Admitting: Internal Medicine

## 2015-06-21 VITALS — BP 132/67 | HR 66 | Temp 97.9°F | Ht 62.5 in | Wt 207.5 lb

## 2015-06-21 DIAGNOSIS — I10 Essential (primary) hypertension: Secondary | ICD-10-CM | POA: Diagnosis not present

## 2015-06-21 DIAGNOSIS — Z0000 Encounter for general adult medical examination without abnormal findings: Secondary | ICD-10-CM | POA: Diagnosis not present

## 2015-06-21 DIAGNOSIS — E119 Type 2 diabetes mellitus without complications: Secondary | ICD-10-CM | POA: Diagnosis not present

## 2015-06-21 DIAGNOSIS — Z23 Encounter for immunization: Secondary | ICD-10-CM

## 2015-06-21 LAB — COMPREHENSIVE METABOLIC PANEL
ALT: 9 U/L (ref 0–35)
AST: 15 U/L (ref 0–37)
Albumin: 3.9 g/dL (ref 3.5–5.2)
Alkaline Phosphatase: 94 U/L (ref 39–117)
BUN: 14 mg/dL (ref 6–23)
CO2: 32 mEq/L (ref 19–32)
Calcium: 9.3 mg/dL (ref 8.4–10.5)
Chloride: 104 mEq/L (ref 96–112)
Creatinine, Ser: 0.91 mg/dL (ref 0.40–1.20)
GFR: 79.78 mL/min (ref >60.00)
Glucose, Bld: 104 mg/dL — ABNORMAL HIGH (ref 70–99)
Potassium: 4.3 mEq/L (ref 3.5–5.1)
Sodium: 137 mEq/L (ref 135–145)
Total Bilirubin: 0.4 mg/dL (ref 0.2–1.2)
Total Protein: 7 g/dL (ref 6.0–8.3)

## 2015-06-21 LAB — HM DIABETES FOOT EXAM: HM Diabetic Foot Exam: NORMAL

## 2015-06-21 LAB — LIPID PANEL
Cholesterol: 142 mg/dL (ref 0–200)
HDL: 53.1 mg/dL (ref >39.00)
LDL Cholesterol: 75 mg/dL (ref 0–99)
NonHDL: 88.9
Total CHOL/HDL Ratio: 3
Triglycerides: 70 mg/dL (ref 0.0–149.0)
VLDL: 14 mg/dL (ref 0.0–40.0)

## 2015-06-21 LAB — HEMOGLOBIN A1C: Hgb A1c MFr Bld: 6.2 % (ref 4.6–6.5)

## 2015-06-21 LAB — MICROALBUMIN / CREATININE URINE RATIO
Creatinine,U: 68.2 mg/dL
Microalb Creat Ratio: 5 mg/g (ref 0.0–30.0)
Microalb, Ur: 3.4 mg/dL — ABNORMAL HIGH (ref 0.0–1.9)

## 2015-06-21 NOTE — Patient Instructions (Signed)

## 2015-06-21 NOTE — Progress Notes (Signed)
Pre visit review using our clinic review tool, if applicable. No additional management support is needed unless otherwise documented below in the visit note. 

## 2015-06-21 NOTE — Progress Notes (Signed)
The patient is here for annual Medicare Wellness Examination and management of other chronic and acute problems.   The risk factors are reflected in the history.  The roster of all physicians providing medical care to patient - is listed in the Snapshot section of the chart.  Activities of daily living:   The patient is 100% independent in all ADLs: dressing, toileting, feeding as well as independent mobility. Patient lives in a 2 story home with husband. Has a dog. Has carpeted and laminate floors.  Home safety :  The patient has smoke detectors in the home.  They wear seatbelts in their car. There are no firearms at home.  There is no violence in the home. They feel safe where they live.  Infectious Risks: There is no risks for hepatitis, STDs or HIV.  There is no  history of blood transfusion.  They have no travel history to infectious disease endemic areas of the world.  Additional Health Care Providers: The patient has not seen their dentist in the last six months. Dentist - [place] They have seen their eye doctor in the last year. Opthalmologist - [facility] They deny hearing issues. They have deferred audiologic testing in the last year.   They do not  have excessive sun exposure. Discussed the need for sun protection: hats,long sleeves and use of sunscreen if there is significant sun exposure.  Dermatologist - none   Diet: the importance of a healthy diet is discussed. They do have a healthy diet.  The benefits of regular aerobic exercise were discussed. Planning to join [employer].  Depression screen: there are no signs or vegative symptoms of depression- irritability, change in appetite, anhedonia, sadness/tearfullness.  Cognitive assessment: the patient manages all their financial and personal affairs and is actively engaged. They could relate day,date,year and events.  HCPOA - recently filled out paperwork for this, husband, Sueanne Maniaci,  Sr Living Will - yes in place  The following portions of the patient's history were reviewed and updated as appropriate: allergies, current medications, past family history, past medical history,  past surgical history, past social history and problem list.  Visual acuity was not assessed per patient preference as they have regular follow up with their ophthalmologist. Hearing and body mass index were assessed and reviewed.   During the course of the visit the patient was educated and counseled about appropriate screening and preventive services including : fall prevention , diabetes screening, nutrition counseling, colorectal cancer screening, and recommended immunizations.    Review of Systems  Constitutional: Negative for fever, chills, appetite change, fatigue and unexpected weight change.  Eyes: Negative for visual disturbance.  Respiratory: Negative for shortness of breath.   Cardiovascular: Negative for chest pain and leg swelling.  Gastrointestinal: Negative for nausea, vomiting, abdominal pain, diarrhea and constipation.  Musculoskeletal: Negative for myalgias and arthralgias.  Skin: Negative for color change and rash.  Hematological: Negative for adenopathy. Does not bruise/bleed easily.  Psychiatric/Behavioral: Negative for suicidal ideas, sleep disturbance and dysphoric mood. The patient is not nervous/anxious.        Objective:    BP 132/67 mmHg  Pulse 66  Temp(Src) 97.9 F (36.6 C) (Oral)  Ht 5' 2.5" (1.588 m)  Wt 207 lb 8 oz (94.121 kg)  BMI 37.32 kg/m2  SpO2 98% Physical Exam  Constitutional: She is oriented to person, place, and time. She appears well-developed and well-nourished. No distress.  HENT:  Head: Normocephalic and atraumatic.  Right Ear: External ear normal.  Left Ear: External ear normal.  Nose: Nose normal.  Mouth/Throat: Oropharynx is clear and moist. No oropharyngeal exudate.  Eyes: Conjunctivae are normal. Pupils are equal, round, and reactive  to light. Right eye exhibits no discharge. Left eye exhibits no discharge. No scleral icterus.  Neck: Normal range of motion. Neck supple. No tracheal deviation present. No thyromegaly present.  Cardiovascular: Normal rate, regular rhythm, normal heart sounds and intact distal pulses.  Exam reveals no gallop and no friction rub.   No murmur heard. Pulmonary/Chest: Effort normal and breath sounds normal. No accessory muscle usage. No tachypnea. No respiratory distress. She has no decreased breath sounds. She has no wheezes. She has no rales. She exhibits no tenderness. Right breast exhibits no inverted nipple, no mass, no nipple discharge, no skin change and no tenderness. Left breast exhibits no inverted nipple, no mass, no nipple discharge, no skin change and no tenderness. Breasts are symmetrical.  Abdominal: Soft. Bowel sounds are normal. She exhibits no distension and no mass. There is no tenderness. There is no rebound and no guarding.  Musculoskeletal: Normal range of motion. She exhibits no edema or tenderness.  Lymphadenopathy:    She has no cervical adenopathy.  Neurological: She is alert and oriented to person, place, and time. No cranial nerve deficit. She exhibits normal muscle tone. Coordination normal.  Skin: Skin is warm and dry. No rash noted. She is not diaphoretic. No erythema. No pallor.  Psychiatric: She has a normal mood and affect. Her behavior is normal. Judgment and thought content normal.          Assessment & Plan:  Patient was given a handout regarding current recommendations for health maintenance and preventative care on the AVS.  Problem List Items Addressed This Visit      Unprioritized   Diabetes mellitus type 2, controlled   Relevant Orders   Comprehensive metabolic panel   Hemoglobin A1c   Lipid panel   Microalbumin / creatinine urine ratio   Essential hypertension, benign   Welcome to Medicare preventive visit - Primary    General medical exam  including breast exam normal today. PAP and pelvic deferred as normal PAP in 2015, HPV neg. Mammogram UTD and reviewed. Colonoscopy UTD. Immunizations UTD except for Prevnar which was given today. Labs as ordered. Encouraged healthy diet and exercise. Dexa ordered.      Relevant Orders   DG Bone Density       Return in about 6 months (around 12/21/2015) for Recheck of Diabetes.

## 2015-06-21 NOTE — Addendum Note (Signed)
Addended by: Vernetta Honey on: 06/21/2015 10:57 AM   Modules accepted: Orders

## 2015-06-21 NOTE — Assessment & Plan Note (Signed)
General medical exam including breast exam normal today. PAP and pelvic deferred as normal PAP in 2015, HPV neg. Mammogram UTD and reviewed. Colonoscopy UTD. Immunizations UTD except for Prevnar which was given today. Labs as ordered. Encouraged healthy diet and exercise. Dexa ordered.

## 2015-06-30 ENCOUNTER — Ambulatory Visit (INDEPENDENT_AMBULATORY_CARE_PROVIDER_SITE_OTHER): Payer: 59 | Admitting: Nurse Practitioner

## 2015-06-30 ENCOUNTER — Encounter: Payer: Self-pay | Admitting: Internal Medicine

## 2015-06-30 VITALS — BP 128/64 | HR 75 | Temp 98.3°F | Resp 16 | Ht 62.5 in

## 2015-06-30 DIAGNOSIS — H6592 Unspecified nonsuppurative otitis media, left ear: Secondary | ICD-10-CM

## 2015-06-30 MED ORDER — AMOXICILLIN 500 MG PO CAPS: 500.0000 mg | ORAL_CAPSULE | Freq: Two times a day (BID) | ORAL | Status: DC

## 2015-06-30 MED ORDER — GUAIFENESIN-CODEINE 100-10 MG/5ML PO SYRP: 5.0000 mL | ORAL_SOLUTION | Freq: Every day | ORAL | Status: DC

## 2015-06-30 MED ORDER — FLUTICASONE PROPIONATE 50 MCG/ACT NA SUSP
2.0000 | Freq: Every day | NASAL | Status: DC
Start: 2015-06-30 — End: 2020-12-29

## 2015-06-30 NOTE — Patient Instructions (Addendum)
Continue Flonase   Amoxicillin twice daily for 5 days   Probiotics!   Cough syrup 1 tsp at night (5 mL)

## 2015-06-30 NOTE — Progress Notes (Signed)
   Subjective:    Patient ID: Martha [name], female    DOB: [birth year], 64 y.o.   MRN: [id number]  HPI  Martha [name] is a 64 yo female with a CC of URI.   1) URI- 3 days, coughing, sinus pressure, chest congestion, eyes hurt-pressing on them hurts, felt the worst about 3 am this morning  Granddaughter had a cold  Coricidin cold and flu- not helpful  Dayquil- helpful  Flonase- Not using  Nasocort- used husbands/not helpful   Review of Systems  Constitutional: Positive for chills. Negative for fever, diaphoresis and fatigue.  HENT: Positive for congestion and sinus pressure.   Respiratory: Positive for cough. Negative for chest tightness, shortness of breath and wheezing.   Cardiovascular: Negative for chest pain, palpitations and leg swelling.  Gastrointestinal: Negative for nausea, vomiting and diarrhea.  Skin: Negative for rash.  Neurological: Negative for dizziness, weakness, numbness and headaches.  Psychiatric/Behavioral: The patient is not nervous/anxious.       Objective:   Physical Exam  Constitutional: She is oriented to person, place, and time. She appears well-developed and well-nourished. No distress.  BP 128/64 mmHg  Pulse 75  Temp(Src) 98.3 F (36.8 C)  Resp 16  Ht 5' 2.5" (1.588 m)  SpO2 97%   HENT:  Head: Normocephalic and atraumatic.  Right Ear: External ear normal.  Left Ear: External ear normal.  Left TM injected and bulging Right TM blocked by Cerumen  Cardiovascular: Normal rate, regular rhythm, normal heart sounds and intact distal pulses.  Exam reveals no gallop and no friction rub.   No murmur heard. Pulmonary/Chest: Effort normal and breath sounds normal. No respiratory distress. She has no wheezes. She has no rales. She exhibits no tenderness.  Neurological: She is alert and oriented to person, place, and time. No cranial nerve deficit. She exhibits normal muscle tone. Coordination normal.  Skin: Skin is warm and dry. No rash noted. She is not  diaphoretic.  Psychiatric: She has a normal mood and affect. Her behavior is normal. Judgment and thought content normal.      Assessment & Plan:  OME left  1) Amoxicillin, encouraged probiotics 2) Flonase for helping with inflammation 3) Cough syrup with instructions given to pt (1 tsp 5 mL at night, no driving or operating heavy machinery).  4) FU prn worsening/failure to improve.

## 2015-06-30 NOTE — Progress Notes (Signed)
Pre visit review using our clinic review tool, if applicable. No additional management support is needed unless otherwise documented below in the visit note. 

## 2015-06-30 NOTE — Telephone Encounter (Signed)
Called pt and appt scheduled for today with [facility].

## 2015-07-10 ENCOUNTER — Encounter: Payer: Self-pay | Admitting: Nurse Practitioner

## 2015-07-21 ENCOUNTER — Encounter: Payer: Self-pay | Admitting: Internal Medicine

## 2015-07-22 ENCOUNTER — Encounter: Payer: Self-pay | Admitting: Internal Medicine

## 2015-07-22 DIAGNOSIS — N390 Urinary tract infection, site not specified: Secondary | ICD-10-CM | POA: Diagnosis not present

## 2015-07-22 DIAGNOSIS — R509 Fever, unspecified: Secondary | ICD-10-CM | POA: Diagnosis not present

## 2015-07-22 DIAGNOSIS — R300 Dysuria: Secondary | ICD-10-CM | POA: Diagnosis not present

## 2015-07-26 ENCOUNTER — Ambulatory Visit (INDEPENDENT_AMBULATORY_CARE_PROVIDER_SITE_OTHER): Payer: Medicare Other | Admitting: Internal Medicine

## 2015-07-26 ENCOUNTER — Encounter: Payer: Self-pay | Admitting: Internal Medicine

## 2015-07-26 VITALS — BP 108/71 | HR 69 | Temp 98.2°F | Ht 62.5 in | Wt 203.5 lb

## 2015-07-26 DIAGNOSIS — N644 Mastodynia: Secondary | ICD-10-CM | POA: Insufficient documentation

## 2015-07-26 DIAGNOSIS — N3000 Acute cystitis without hematuria: Secondary | ICD-10-CM | POA: Diagnosis not present

## 2015-07-26 DIAGNOSIS — E119 Type 2 diabetes mellitus without complications: Secondary | ICD-10-CM

## 2015-07-26 NOTE — Patient Instructions (Addendum)
Continue Doxycycline for 10 day course.  Follow up if any recurrent symptoms.  We will set up a bilateral mammogram to evaluate breast pain.

## 2015-07-26 NOTE — Addendum Note (Signed)
Addended by: Ronette Deter A on: 07/26/2015 03:03 PM   Modules accepted: Orders

## 2015-07-26 NOTE — Progress Notes (Signed)
Subjective:    Patient ID: Martha [name], female    DOB: [birth year], 64 y.o.   MRN: [id number]  HPI  64YO female presents for follow up.  Treated at [facility] for UTI on Friday. Started Doxycycline. Symptoms improved after starting Doxycycline. No fever, chills. No dysuria.  Also treated for URI in early 06/2015. Symptoms of cough have improved. No dyspnea.   DM - Out of test streps. Compliant with medication.  Right breast pain - Noticed some burning pain in right medial breast a few days ago. Not sure she appreciated any nodular area. No overlying skin changes. No drainage from nipple. Mammogram due in 09/2015.  Past medical, surgical, family and social history per today's encounter.  Review of Systems  Constitutional: Negative for fever, chills and fatigue.  Cardiovascular: Positive for chest pain (right breast).  Gastrointestinal: Negative for nausea, vomiting, abdominal pain, diarrhea, constipation and rectal pain.  Genitourinary: Positive for dysuria, urgency and frequency. Negative for hematuria, flank pain, decreased urine volume, vaginal bleeding, vaginal discharge, difficulty urinating, vaginal pain and pelvic pain.       Objective:    BP 108/71 mmHg  Pulse 69  Temp(Src) 98.2 F (36.8 C) (Oral)  Ht 5' 2.5" (1.588 m)  Wt 203 lb 8 oz (92.307 kg)  BMI 36.60 kg/m2  SpO2 97% Physical Exam  Constitutional: She is oriented to person, place, and time. She appears well-developed and well-nourished. No distress.  HENT:  Head: Normocephalic and atraumatic.  Right Ear: External ear normal.  Left Ear: External ear normal.  Nose: Nose normal.  Mouth/Throat: Oropharynx is clear and moist. No oropharyngeal exudate.  Eyes: Conjunctivae are normal. Pupils are equal, round, and reactive to light. Right eye exhibits no discharge. Left eye exhibits no discharge. No scleral icterus.  Neck: Normal range of motion. Neck supple. No tracheal deviation present. No thyromegaly  present.  Cardiovascular: Normal rate, regular rhythm, normal heart sounds and intact distal pulses.  Exam reveals no gallop and no friction rub.   No murmur heard. Pulmonary/Chest: Effort normal and breath sounds normal. No accessory muscle usage. No tachypnea. No respiratory distress. She has no decreased breath sounds. She has no wheezes. She has no rales. She exhibits no tenderness. Right breast exhibits no inverted nipple, no mass, no nipple discharge, no skin change and no tenderness. Left breast exhibits no inverted nipple, no mass, no nipple discharge, no skin change and no tenderness. Breasts are symmetrical.  Abdominal: Soft. Bowel sounds are normal. She exhibits no distension and no mass. There is no tenderness. There is no rebound and no guarding.  Musculoskeletal: Normal range of motion. She exhibits no edema or tenderness.  Lymphadenopathy:    She has no cervical adenopathy.  Neurological: She is alert and oriented to person, place, and time. No cranial nerve deficit. She exhibits normal muscle tone. Coordination normal.  Skin: Skin is warm and dry. No rash noted. She is not diaphoretic. No erythema. No pallor.  Psychiatric: She has a normal mood and affect. Her behavior is normal. Judgment and thought content normal.          Assessment & Plan:   Problem List Items Addressed This Visit      Unprioritized   Acute cystitis without hematuria - Primary    Reviewed notes form [facility]. UA c/w UTI, however urine culture was negative. Will have her continue Doxycycline. Follow up prn.      Breast pain    Right breast pain. Exam normal  today. No nodules appreciated. Will set up bilateral diagnostic mammogram.      Relevant Orders   MM Digital Diagnostic Bilat   Diabetes mellitus type 2, controlled    Excellent control of blood sugars. Continue Metformin. Glucometer given today.          Return in about 4 weeks (around 08/23/2015) for Recheck.

## 2015-07-26 NOTE — Assessment & Plan Note (Signed)
Reviewed notes form [facility]. UA c/w UTI, however urine culture was negative. Will have her continue Doxycycline. Follow up prn.

## 2015-07-26 NOTE — Assessment & Plan Note (Addendum)
Right breast pain. Exam normal today. No nodules appreciated. Will set up bilateral diagnostic mammogram.

## 2015-07-26 NOTE — Progress Notes (Signed)
Pre visit review using our clinic review tool, if applicable. No additional management support is needed unless otherwise documented below in the visit note. 

## 2015-07-26 NOTE — Assessment & Plan Note (Signed)
Excellent control of blood sugars. Continue Metformin. Glucometer given today.

## 2015-07-28 ENCOUNTER — Ambulatory Visit
Admission: RE | Admit: 2015-07-28 | Discharge: 2015-07-28 | Disposition: A | Discharge status: Home / Self Care | Payer: Medicare Other | Source: Ambulatory Visit | Attending: Internal Medicine | Admitting: Internal Medicine

## 2015-07-28 DIAGNOSIS — N644 Mastodynia: Secondary | ICD-10-CM

## 2015-07-28 DIAGNOSIS — Z13820 Encounter for screening for osteoporosis: Secondary | ICD-10-CM | POA: Diagnosis not present

## 2015-07-28 DIAGNOSIS — R928 Other abnormal and inconclusive findings on diagnostic imaging of breast: Secondary | ICD-10-CM | POA: Diagnosis not present

## 2015-07-28 DIAGNOSIS — Z0000 Encounter for general adult medical examination without abnormal findings: Secondary | ICD-10-CM

## 2015-07-28 DIAGNOSIS — Z780 Asymptomatic menopausal state: Secondary | ICD-10-CM | POA: Diagnosis not present

## 2015-08-02 ENCOUNTER — Ambulatory Visit: Payer: Medicare Other | Admitting: Internal Medicine

## 2016-02-21 ENCOUNTER — Ambulatory Visit (INDEPENDENT_AMBULATORY_CARE_PROVIDER_SITE_OTHER): Payer: Medicare Other | Admitting: Internal Medicine

## 2016-02-21 ENCOUNTER — Encounter: Payer: Self-pay | Admitting: Internal Medicine

## 2016-02-21 VITALS — BP 103/70 | HR 69 | Temp 98.1°F | Ht 63.0 in | Wt 211.0 lb

## 2016-02-21 DIAGNOSIS — J988 Other specified respiratory disorders: Secondary | ICD-10-CM | POA: Insufficient documentation

## 2016-02-21 DIAGNOSIS — I10 Essential (primary) hypertension: Secondary | ICD-10-CM

## 2016-02-21 DIAGNOSIS — J069 Acute upper respiratory infection, unspecified: Secondary | ICD-10-CM | POA: Diagnosis not present

## 2016-02-21 DIAGNOSIS — E119 Type 2 diabetes mellitus without complications: Secondary | ICD-10-CM | POA: Diagnosis not present

## 2016-02-21 DIAGNOSIS — B9789 Other viral agents as the cause of diseases classified elsewhere: Secondary | ICD-10-CM | POA: Insufficient documentation

## 2016-02-21 NOTE — Assessment & Plan Note (Signed)
BG well controlled by report. Will check A1c with labs. Continue Metformin. 

## 2016-02-21 NOTE — Progress Notes (Signed)
Pre visit review using our clinic review tool, if applicable. No additional management support is needed unless otherwise documented below in the visit note. 

## 2016-02-21 NOTE — Patient Instructions (Addendum)
Continue supportive care for likely viral upper respiratory infection.  Labs today to check A1c.

## 2016-02-21 NOTE — Assessment & Plan Note (Signed)
BP Readings from Last 3 Encounters:  02/21/16 103/70  07/26/15 108/71  06/30/15 128/64   BP well controlled. Renal function with labs. Continue Lisinopril-HCTZ.

## 2016-02-21 NOTE — Assessment & Plan Note (Signed)
Symptoms improving. Exam normal today. Encouraged continued supportive care. Follow up prn if symptoms are not improving.

## 2016-02-21 NOTE — Progress Notes (Signed)
Subjective:    Patient ID: Martha [name], female    DOB: 26-Jun-1951, 65 y.o.   MRN: [id number]  HPI  65YO female presents for acute visit.  Congestion - Nasal congestion, dry cough. No dypsnea, chest pain. No fever. Last week. No myalgia or chills out of ordinary. Feeling better this week. Taking some Zyrtec on occasion.  DM - BG typically near 130. Compliant with medication. No BG over 200.    Wt Readings from Last 3 Encounters:  02/21/16 211 lb (95.709 kg)  07/26/15 203 lb 8 oz (92.307 kg)  06/21/15 207 lb 8 oz (94.121 kg)   BP Readings from Last 3 Encounters:  02/21/16 103/70  07/26/15 108/71  06/30/15 128/64    Past Medical History  Diagnosis Date  . Diabetes mellitus without complication ([place])   . Status post dilation of esophageal narrowing 2008   Family History  Problem Relation Age of Onset  . Hypertension Mother   . Heart disease Father 46  . Hypertension Daughter   . Cancer Maternal Aunt 80    breast and ovary   Past Surgical History  Procedure Laterality Date  . Dilation and curettage of uterus     Social History   Social History  . Marital Status: Married    Spouse Name: N/A  . Number of Children: N/A  . Years of Education: N/A   Social History Main Topics  . Smoking status: Former Smoker    Quit date: 07/24/1990  . Smokeless tobacco: Never Used  . Alcohol Use: Yes     Comment: Socially  . Drug Use: No  . Sexual Activity: Not Asked   Other Topics Concern  . None   Social History Narrative   [place]ives in [place]. From [place]. Son lives with pt. Dog in home.      Work - [employer], and Theme park manager, now retired.      Diet - regular      Exercise - no regular    Review of Systems  Constitutional: Positive for fatigue. Negative for fever, chills, appetite change and unexpected weight change.  HENT: Positive for congestion, postnasal drip and rhinorrhea. Negative for ear discharge, ear pain, facial swelling, hearing loss, mouth sores,  nosebleeds, sinus pressure, sneezing, sore throat, tinnitus, trouble swallowing and voice change.   Eyes: Negative for pain, discharge, redness and visual disturbance.  Respiratory: Negative for cough, chest tightness, shortness of breath, wheezing and stridor.   Cardiovascular: Negative for chest pain, palpitations and leg swelling.  Gastrointestinal: Negative for abdominal pain.  Musculoskeletal: Negative for myalgias, arthralgias, neck pain and neck stiffness.  Skin: Negative for color change and rash.  Neurological: Negative for dizziness, weakness, light-headedness and headaches.  Hematological: Negative for adenopathy. Does not bruise/bleed easily.  Psychiatric/Behavioral: Negative for dysphoric mood. The patient is not nervous/anxious.        Objective:    BP 103/70 mmHg  Pulse 69  Temp(Src) 98.1 F (36.7 C) (Oral)  Ht 5\' 3"  (1.6 m)  Wt 211 lb (95.709 kg)  BMI 37.39 kg/m2  SpO2 99% Physical Exam  Constitutional: She is oriented to person, place, and time. She appears well-developed and well-nourished. No distress.  HENT:  Head: Normocephalic and atraumatic.  Right Ear: External ear normal.  Left Ear: External ear normal.  Nose: Nose normal.  Mouth/Throat: Oropharynx is clear and moist. No oropharyngeal exudate.  Eyes: Conjunctivae are normal. Pupils are equal, round, and reactive to light. Right eye exhibits no discharge. Left eye exhibits no  discharge. No scleral icterus.  Neck: Normal range of motion. Neck supple. No tracheal deviation present. No thyromegaly present.  Cardiovascular: Normal rate, regular rhythm, normal heart sounds and intact distal pulses.  Exam reveals no gallop and no friction rub.   No murmur heard. Pulmonary/Chest: Effort normal and breath sounds normal. No accessory muscle usage. No respiratory distress. She has no decreased breath sounds. She has no wheezes. She has no rhonchi. She has no rales. She exhibits no tenderness.  Musculoskeletal: Normal  range of motion. She exhibits no edema or tenderness.  Lymphadenopathy:    She has no cervical adenopathy.  Neurological: She is alert and oriented to person, place, and time. No cranial nerve deficit. She exhibits normal muscle tone. Coordination normal.  Skin: Skin is warm and dry. No rash noted. She is not diaphoretic. No erythema. No pallor.  Psychiatric: She has a normal mood and affect. Her behavior is normal. Judgment and thought content normal.          Assessment & Plan:   Problem List Items Addressed This Visit      Unprioritized   Diabetes mellitus type 2, controlled ([place])    BG well controlled by report. Will check A1c with labs. Continue Metformin.      Relevant Orders   Comprehensive metabolic panel   Hemoglobin A1c   Lipid panel   Microalbumin / creatinine urine ratio   Essential hypertension, benign    BP Readings from Last 3 Encounters:  02/21/16 103/70  07/26/15 108/71  06/30/15 128/64   BP well controlled. Renal function with labs. Continue Lisinopril-HCTZ.      Viral URI with cough - Primary    Symptoms improving. Exam normal today. Encouraged continued supportive care. Follow up prn if symptoms are not improving.          Return in about 3 months (around 05/20/2016) for Physical.  Ronette Deter, MD Internal Medicine [facility]

## 2016-02-22 LAB — COMPREHENSIVE METABOLIC PANEL
ALT: 12 U/L (ref 0–35)
AST: 18 U/L (ref 0–37)
Albumin: 4.1 g/dL (ref 3.5–5.2)
Alkaline Phosphatase: 92 U/L (ref 39–117)
BUN: 21 mg/dL (ref 6–23)
CO2: 29 mEq/L (ref 19–32)
Calcium: 9.3 mg/dL (ref 8.4–10.5)
Chloride: 103 mEq/L (ref 96–112)
Creatinine, Ser: 1.33 mg/dL — ABNORMAL HIGH (ref 0.40–1.20)
GFR: 51.38 mL/min — ABNORMAL LOW (ref >60.00)
Glucose, Bld: 112 mg/dL — ABNORMAL HIGH (ref 70–99)
Potassium: 4.1 mEq/L (ref 3.5–5.1)
Sodium: 138 mEq/L (ref 135–145)
Total Bilirubin: 0.2 mg/dL (ref 0.2–1.2)
Total Protein: 7.4 g/dL (ref 6.0–8.3)

## 2016-02-22 LAB — HEMOGLOBIN A1C: Hgb A1c MFr Bld: 6.5 % (ref 4.6–6.5)

## 2016-02-22 LAB — MICROALBUMIN / CREATININE URINE RATIO
Creatinine,U: 147.7 mg/dL
Microalb Creat Ratio: 1.9 mg/g (ref 0.0–30.0)
Microalb, Ur: 2.8 mg/dL — ABNORMAL HIGH (ref 0.0–1.9)

## 2016-02-22 LAB — LIPID PANEL
Cholesterol: 176 mg/dL (ref 0–200)
HDL: 51.9 mg/dL (ref >39.00)
LDL Cholesterol: 98 mg/dL (ref 0–99)
NonHDL: 123.68
Total CHOL/HDL Ratio: 3
Triglycerides: 128 mg/dL (ref 0.0–149.0)
VLDL: 25.6 mg/dL (ref 0.0–40.0)

## 2016-03-08 ENCOUNTER — Ambulatory Visit (INDEPENDENT_AMBULATORY_CARE_PROVIDER_SITE_OTHER): Payer: Medicare Other | Admitting: Internal Medicine

## 2016-03-08 ENCOUNTER — Encounter: Payer: Self-pay | Admitting: Internal Medicine

## 2016-03-08 VITALS — BP 134/74 | HR 69 | Temp 98.4°F | Ht 63.0 in | Wt 208.0 lb

## 2016-03-08 DIAGNOSIS — N289 Disorder of kidney and ureter, unspecified: Secondary | ICD-10-CM | POA: Insufficient documentation

## 2016-03-08 DIAGNOSIS — E669 Obesity, unspecified: Secondary | ICD-10-CM | POA: Diagnosis not present

## 2016-03-08 DIAGNOSIS — I10 Essential (primary) hypertension: Secondary | ICD-10-CM

## 2016-03-08 DIAGNOSIS — E119 Type 2 diabetes mellitus without complications: Secondary | ICD-10-CM

## 2016-03-08 LAB — BASIC METABOLIC PANEL
BUN: 15 mg/dL (ref 6–23)
CO2: 31 mEq/L (ref 19–32)
Calcium: 10.2 mg/dL (ref 8.4–10.5)
Chloride: 102 mEq/L (ref 96–112)
Creatinine, Ser: 0.92 mg/dL (ref 0.40–1.20)
GFR: 78.61 mL/min (ref >60.00)
Glucose, Bld: 92 mg/dL (ref 70–99)
Potassium: 4.6 mEq/L (ref 3.5–5.1)
Sodium: 139 mEq/L (ref 135–145)

## 2016-03-08 MED ORDER — LISINOPRIL-HYDROCHLOROTHIAZIDE 20-12.5 MG PO TABS: 1.0000 | ORAL_TABLET | Freq: Every day | ORAL | Status: DC

## 2016-03-08 MED ORDER — SIMVASTATIN 40 MG PO TABS: 40.0000 mg | ORAL_TABLET | Freq: Every day | ORAL | Status: DC

## 2016-03-08 NOTE — Progress Notes (Signed)
Pre visit review using our clinic review tool, if applicable. No additional management support is needed unless otherwise documented below in the visit note. 

## 2016-03-08 NOTE — Patient Instructions (Signed)
Labs today.  Follow up 3 months. 

## 2016-03-08 NOTE — Assessment & Plan Note (Signed)
BP Readings from Last 3 Encounters:  03/08/16 134/74  02/21/16 103/70  07/26/15 108/71   BP well controlled. Continue Lisinopril-HCTZ.

## 2016-03-08 NOTE — Progress Notes (Signed)
Subjective:    Patient ID: Martha [name], female    DOB: 07/23/1951, 65 y.o.   MRN: [id number]  HPI 65YO female presents for follow up.  Recently seen for acute visit for URI. Labs showed decline in renal function with Cr 1.33.  Feeling good. Limiting soda and processed foods. Trying to be more active and lose weight.  DM - BG well controlled. A1c 6.5%. Taking Metformin only once daily.  Wt Readings from Last 3 Encounters:  03/08/16 208 lb (94.348 kg)  02/21/16 211 lb (95.709 kg)  07/26/15 203 lb 8 oz (92.307 kg)   BP Readings from Last 3 Encounters:  03/08/16 134/74  02/21/16 103/70  07/26/15 108/71    Past Medical History  Diagnosis Date  . Diabetes mellitus without complication ([place])   . Status post dilation of esophageal narrowing 2008   Family History  Problem Relation Age of Onset  . Hypertension Mother   . Heart disease Father 56  . Hypertension Daughter   . Cancer Maternal Aunt 80    breast and ovary   Past Surgical History  Procedure Laterality Date  . Dilation and curettage of uterus     Social History   Social History  . Marital Status: Married    Spouse Name: N/A  . Number of Children: N/A  . Years of Education: N/A   Social History Main Topics  . Smoking status: Former Smoker    Quit date: 07/24/1990  . Smokeless tobacco: Never Used  . Alcohol Use: Yes     Comment: Socially  . Drug Use: No  . Sexual Activity: Not Asked   Other Topics Concern  . None   Social History Narrative   Lives in [place]. From [place]. Son lives with pt. Dog in home.      Work - [employer], and Theme park manager, now retired.      Diet - regular      Exercise - no regular    Review of Systems  Constitutional: Negative for fever, chills, appetite change, fatigue and unexpected weight change.  Eyes: Negative for visual disturbance.  Respiratory: Negative for shortness of breath.   Cardiovascular: Negative for chest pain and leg swelling.  Gastrointestinal: Negative  for nausea, vomiting, abdominal pain, diarrhea and constipation.  Musculoskeletal: Positive for myalgias and arthralgias (occasionally after prolonged standing).  Skin: Negative for color change and rash.  Hematological: Negative for adenopathy. Does not bruise/bleed easily.  Psychiatric/Behavioral: Negative for sleep disturbance and dysphoric mood. The patient is not nervous/anxious.        Objective:    BP 134/74 mmHg  Pulse 69  Temp(Src) 98.4 F (36.9 C) (Oral)  Ht 5\' 3"  (1.6 m)  Wt 208 lb (94.348 kg)  BMI 36.85 kg/m2  SpO2 99% Physical Exam  Constitutional: She is oriented to person, place, and time. She appears well-developed and well-nourished. No distress.  HENT:  Head: Normocephalic and atraumatic.  Right Ear: External ear normal.  Left Ear: External ear normal.  Nose: Nose normal.  Mouth/Throat: Oropharynx is clear and moist. No oropharyngeal exudate.  Eyes: Conjunctivae are normal. Pupils are equal, round, and reactive to light. Right eye exhibits no discharge. Left eye exhibits no discharge. No scleral icterus.  Neck: Normal range of motion. Neck supple. No tracheal deviation present. No thyromegaly present.  Cardiovascular: Normal rate, regular rhythm, normal heart sounds and intact distal pulses.  Exam reveals no gallop and no friction rub.   No murmur heard. Pulmonary/Chest: Effort normal and breath sounds  normal. No respiratory distress. She has no wheezes. She has no rales. She exhibits no tenderness.  Musculoskeletal: Normal range of motion. She exhibits no edema or tenderness.  Lymphadenopathy:    She has no cervical adenopathy.  Neurological: She is alert and oriented to person, place, and time. No cranial nerve deficit. She exhibits normal muscle tone. Coordination normal.  Skin: Skin is warm and dry. No rash noted. She is not diaphoretic. No erythema. No pallor.  Psychiatric: She has a normal mood and affect. Her behavior is normal. Judgment and thought  content normal.          Assessment & Plan:   Problem List Items Addressed This Visit      Unprioritized   Acute renal insufficiency - Primary    Recent labs showed slight decrease in renal function. Likely dehydration. Will repeat renal function with labs today.      Relevant Orders   Basic Metabolic Panel (BMET)   Diabetes mellitus type 2, controlled ([place])    BG well controlled with A1c of 6.5%. Will continue Metformin.      Relevant Medications   lisinopril-hydrochlorothiazide (PRINZIDE,ZESTORETIC) 20-12.5 MG tablet   simvastatin (ZOCOR) 40 MG tablet   Essential hypertension, benign    BP Readings from Last 3 Encounters:  03/08/16 134/74  02/21/16 103/70  07/26/15 108/71   BP well controlled. Continue Lisinopril-HCTZ.      Relevant Medications   lisinopril-hydrochlorothiazide (PRINZIDE,ZESTORETIC) 20-12.5 MG tablet   simvastatin (ZOCOR) 40 MG tablet   Obesity, unspecified    Congratulated pt on weight loss. Encouraged continued effort at healthy diet and exercise.          Return in about 3 months (around 06/08/2016) for Recheck of Diabetes.  Ronette Deter, MD Internal Medicine [facility]

## 2016-03-08 NOTE — Assessment & Plan Note (Signed)
BG well controlled with A1c of 6.5%. Will continue Metformin.

## 2016-03-08 NOTE — Assessment & Plan Note (Signed)
Recent labs showed slight decrease in renal function. Likely dehydration. Will repeat renal function with labs today.

## 2016-03-08 NOTE — Assessment & Plan Note (Signed)
Congratulated pt on weight loss. Encouraged continued effort at healthy diet and exercise.

## 2016-06-05 ENCOUNTER — Telehealth: Payer: Self-pay | Admitting: Internal Medicine

## 2016-06-05 NOTE — Telephone Encounter (Signed)
Yes, she should be seen first

## 2016-06-05 NOTE — Telephone Encounter (Signed)
Please advise 

## 2016-06-05 NOTE — Telephone Encounter (Signed)
Pt made appointment for 6/13 with Arnett to be looked at.

## 2016-06-05 NOTE — Telephone Encounter (Signed)
Yes needs to be seen, thanks

## 2016-06-05 NOTE — Telephone Encounter (Signed)
Pt is starting to itch under her breasts. This has happened before. Pt wanted to know if Dr. Gilford Rile could call her in something. If not pt made an appointment with Arnett next week.

## 2016-06-06 ENCOUNTER — Ambulatory Visit (INDEPENDENT_AMBULATORY_CARE_PROVIDER_SITE_OTHER): Payer: Medicare Other | Admitting: Family

## 2016-06-06 VITALS — BP 120/60 | HR 77 | Temp 98.3°F | Ht 63.0 in | Wt 212.0 lb

## 2016-06-06 DIAGNOSIS — R21 Rash and other nonspecific skin eruption: Secondary | ICD-10-CM

## 2016-06-06 MED ORDER — CLOTRIMAZOLE 1 % EX CREA: 1.0000 "application " | TOPICAL_CREAM | Freq: Two times a day (BID) | CUTANEOUS | Status: DC

## 2016-06-06 NOTE — Patient Instructions (Signed)
Suspect Candidal intertrigo. Trial of cream.  Keep area dry.   If there is no improvement in your symptoms, or if there is any worsening of symptoms, or if you have any additional concerns, please return for re-evaluation; or, if we are closed, consider going to the Emergency Room for evaluation if symptoms urgent.

## 2016-06-06 NOTE — Progress Notes (Signed)
Subjective:    Patient ID: Martha [name], female    DOB: 07/15/1951, 65 y.o.   MRN: [id number]   Martha [name] is a 65 y.o. female who presents today for an acute visit.    HPI Comments: Patient here for evalution of rash under breasts for past week. She states she's in  the very early stage and the rash is not "full-blown" at this time. She describes it as red and itching under both breasts. She states her daughter gets the same thing and advised using rubbing alcohol to keep the area dry. Worse in the summertime. She tried baby powder with little relief. Years ago, she was seen for similar rash under breasts and stomach and given a cream which worked well. Denies nausea, vomiting, chills, fever.  Past Medical History  Diagnosis Date  . Diabetes mellitus without complication ([place])   . Status post dilation of esophageal narrowing 2008   Allergies: Review of patient's allergies indicates no known allergies. Current Outpatient Prescriptions on File Prior to Visit  Medication Sig Dispense Refill  . aspirin 81 MG tablet Take 81 mg by mouth daily.    . Cholecalciferol (VITAMIN D-3 PO) Take 2,000 Int'l Units by mouth daily.    . fluticasone (FLONASE) 50 MCG/ACT nasal spray Place 2 sprays into both nostrils daily. 16 g 2  . glucose blood test strip Use as instructed with Freestyle Meter 100 each 6  . glucose monitoring kit (FREESTYLE) monitoring kit 1 each by Does not apply route as needed for other. 1 each 0  . lisinopril-hydrochlorothiazide (PRINZIDE,ZESTORETIC) 20-12.5 MG tablet Take 1 tablet by mouth daily. 90 tablet 3  . metFORMIN (GLUCOPHAGE) 500 MG tablet Take 1 tablet (500 mg total) by mouth 2 (two) times daily with a meal. 180 tablet 3  . naproxen (NAPROSYN) 500 MG tablet Take 500 mg by mouth 2 (two) times daily with a meal.    . simvastatin (ZOCOR) 40 MG tablet Take 1 tablet (40 mg total) by mouth daily. 90 tablet 3  . TRIAMCINOLONE ACETONIDE, TOP, 0.05 % OINT Apply topically.      No current facility-administered medications on file prior to visit.    Social History  Substance Use Topics  . Smoking status: Former Smoker    Quit date: 07/24/1990  . Smokeless tobacco: Never Used  . Alcohol Use: Yes     Comment: Socially    Review of Systems  Constitutional: Negative for fever and chills.  Respiratory: Negative for cough.   Cardiovascular: Negative for chest pain and palpitations.  Gastrointestinal: Negative for nausea and vomiting.  Skin: Positive for rash.      Objective:    BP 120/60 mmHg  Pulse 77  Temp(Src) 98.3 F (36.8 C)  Ht 5' 3" (1.6 m)  Wt 212 lb (96.163 kg)  BMI 37.56 kg/m2  SpO2 96%   Physical Exam  Constitutional: She appears well-developed and well-nourished.  Eyes: Conjunctivae are normal.  Cardiovascular: Normal rate, regular rhythm, normal heart sounds and normal pulses.   Pulmonary/Chest: Effort normal and breath sounds normal. She has no wheezes. She has no rhonchi. She has no rales.    Neurological: She is alert.  Skin: Skin is warm and dry.  Psychiatric: She has a normal mood and affect. Her speech is normal and behavior is normal. Thought content normal.  Vitals reviewed.      Assessment & Plan:   1. Rash and nonspecific skin eruption Suspect early candidal intertrigo. Will treat empirically with  lotrimin to see if relieves. Patient agreed with plan.   - clotrimazole (LOTRIMIN) 1 % cream; Apply 1 application topically 2 (two) times daily.  Dispense: 30 g; Refill: 2    I am having Ms. Bitton maintain her aspirin, naproxen, Cholecalciferol (VITAMIN D-3 PO), TRIAMCINOLONE ACETONIDE (TOP), glucose monitoring kit, glucose blood, metFORMIN, fluticasone, lisinopril-hydrochlorothiazide, and simvastatin.   No orders of the defined types were placed in this encounter.     Start medications as prescribed and explained to patient on After Visit Summary ( AVS). Risks, benefits, and alternatives of the medications and  treatment plan prescribed today were discussed, and patient expressed understanding.   Education regarding symptom management and diagnosis given to patient.   Follow-up:Plan follow-up and return precautions given if any worsening symptoms or change in condition.   Continue to follow with WALKER,JENNIFER AZBELL, MD for routine health maintenance.   Hannah M Schillo and I agreed with plan.   Margaret Arnett, FNP    

## 2016-06-13 ENCOUNTER — Ambulatory Visit: Payer: Medicare Other | Admitting: Family

## 2016-06-25 IMAGING — MG MM DIAG BREAST TOMO UNI RIGHT
7 series · 8 of 15 positions shown · non-contrast
Comparison: 10/12/2014, 07/31/2013, additional prior studies dating
back to 07/08/2008

CLINICAL DATA: 64-year-old female with focal right breast pain 1
week ago.

EXAM:
DIGITAL DIAGNOSTIC RIGHT MAMMOGRAM WITH 3D TOMOSYNTHESIS WITH CAD
ULTRASOUND RIGHT BREAST

[R TAN]
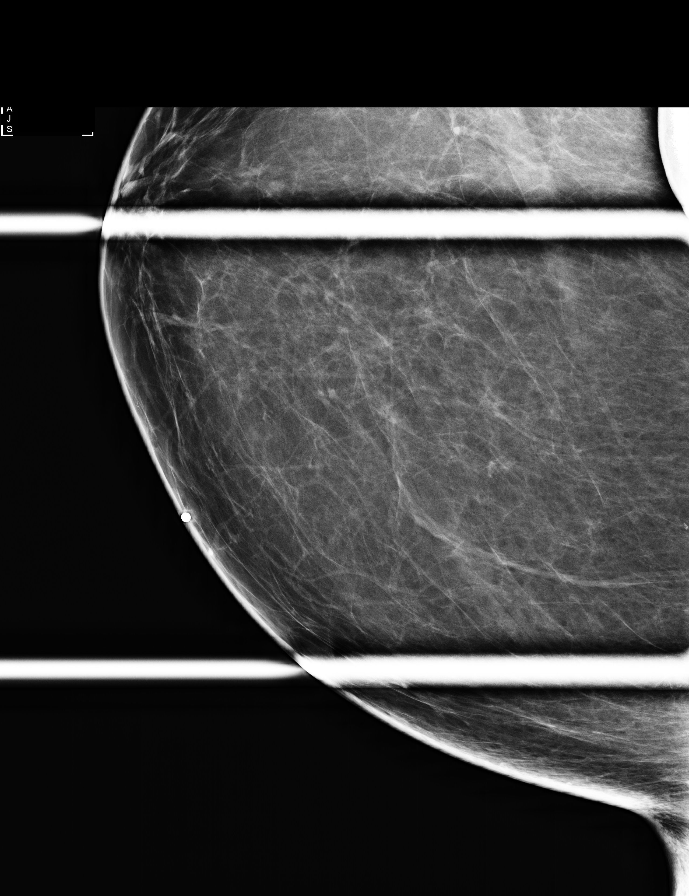

[R CC]
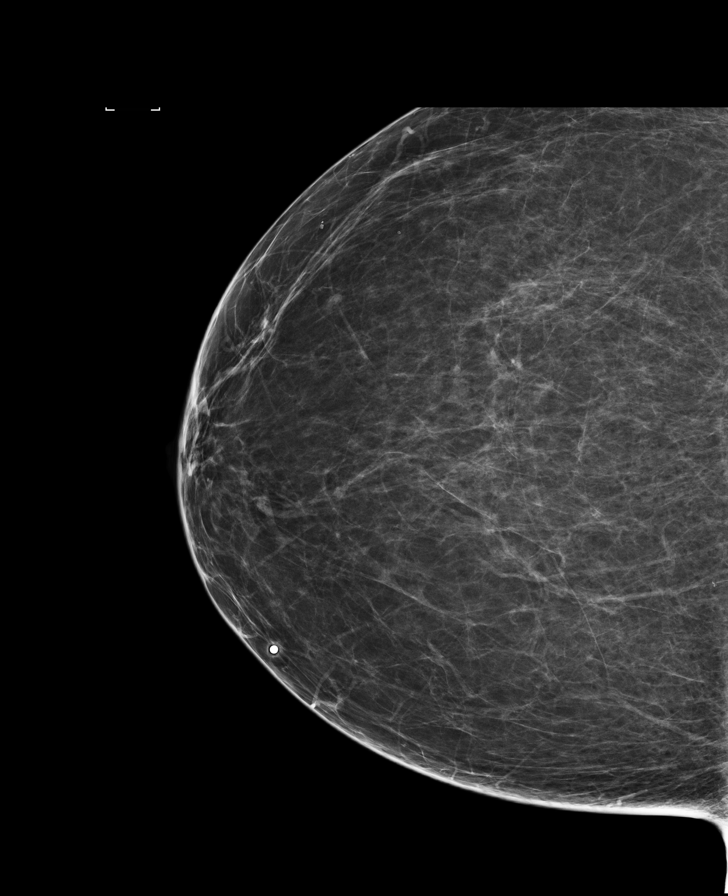

[R CC synth-2D]
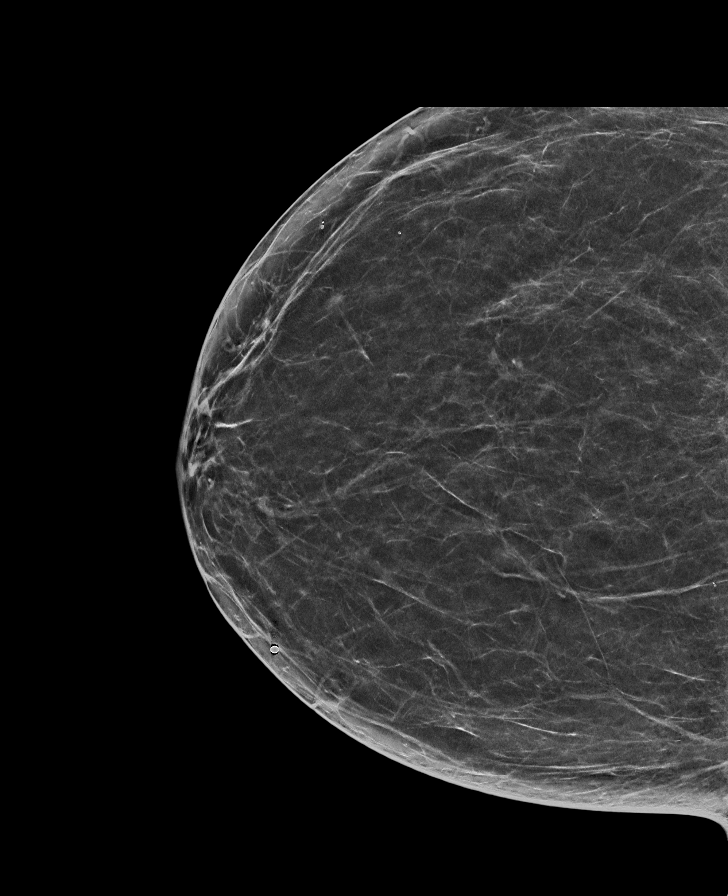

[R MLO]
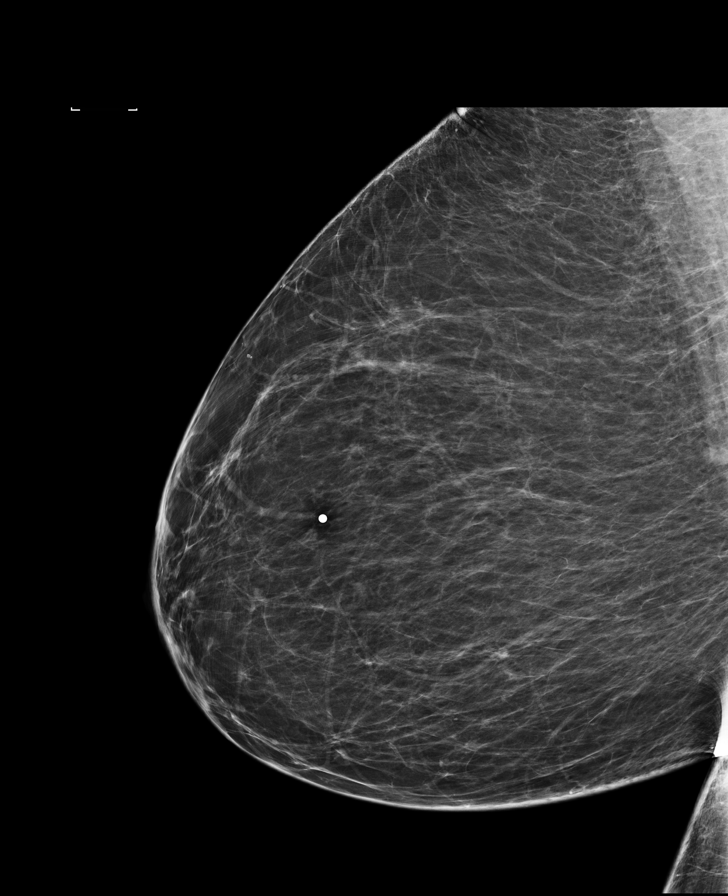

[R MLO synth-2D]
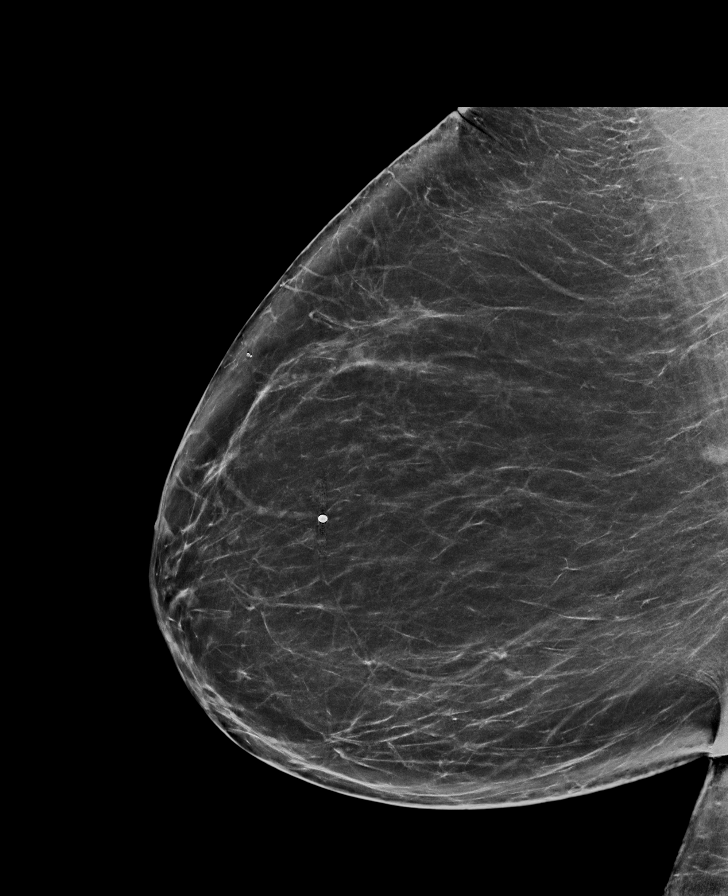

[R MLO tomo · 2 of 90 frames shown]
[frame 29/90]
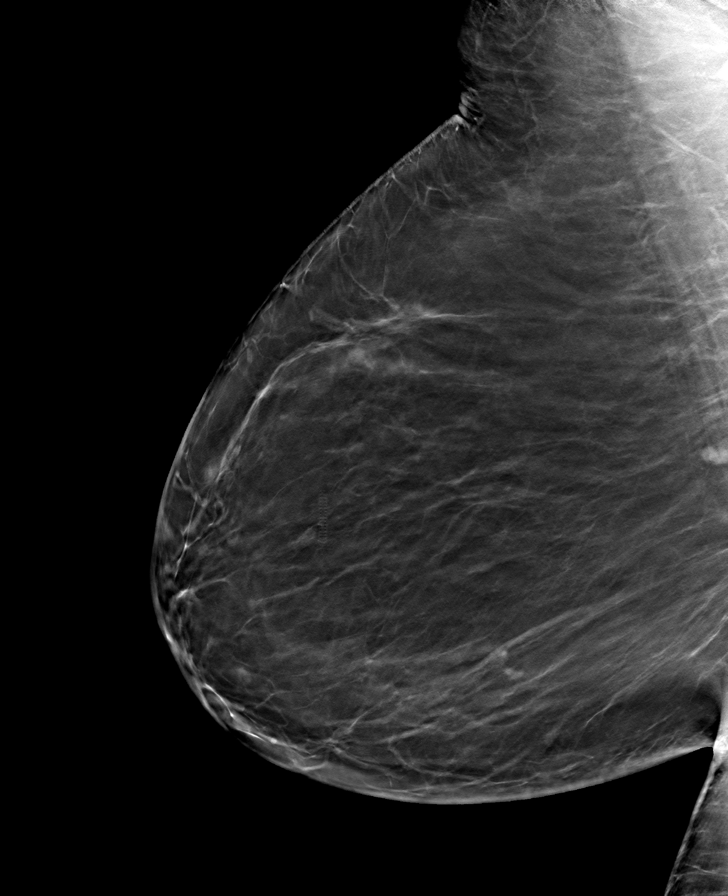
[frame 45/90]
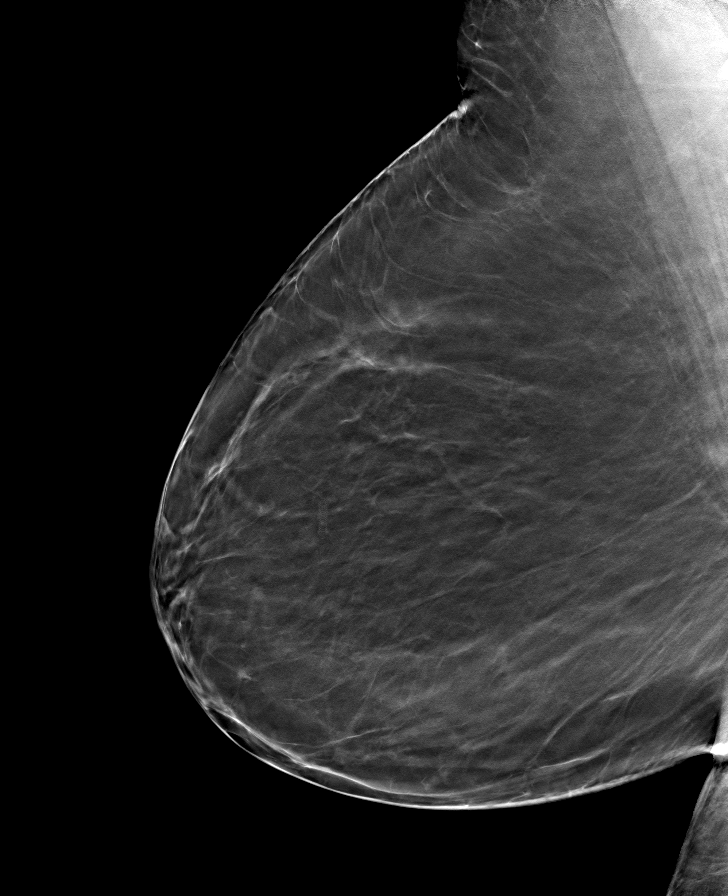

[R CC tomo · tomo slice 37/72.0]
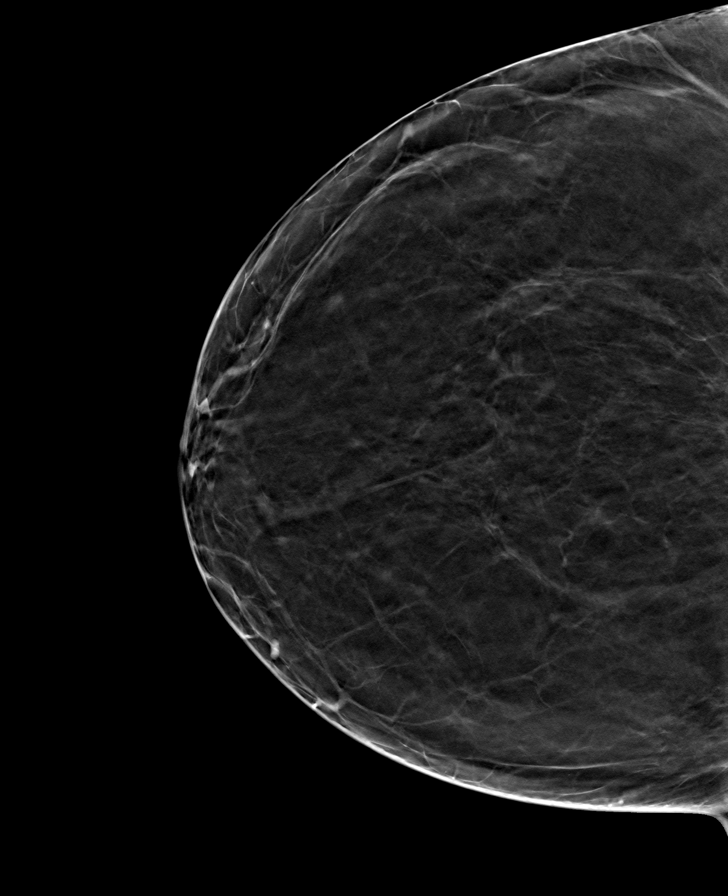

[8 of 15 positions shown; findings below may reference images not displayed]

ACR Breast Density Category b: There are scattered areas of
fibroglandular density.
FINDINGS: No suspicious mass, calcifications, or other abnormality is
identified within the right breast. No mammographic abnormality is
identified underlying the marker of patient's pain.

Mammographic images were processed with CAD.

On physical exam, no discrete mass is felt in the area of patient's
pain within the upper, inner right breast.

Targeted ultrasound is performed, showing no suspicious cystic or
solid sonographic finding in the area of patient's pain within the
upper, inner right breast.
IMPRESSION: No mammographic or sonographic evidence of malignancy.

RECOMMENDATION:
1. Bilateral screening mammogram in September 2015.
2. The patient was instructed to follow-up with her referring
physician regarding her right breast pain.

I have discussed the findings and recommendations with the patient.
Results were also provided in writing at the conclusion of the
visit. If applicable, a reminder letter will be sent to the patient
regarding the next appointment.

BI-RADS CATEGORY  1: Negative.

## 2016-07-17 ENCOUNTER — Encounter: Payer: Medicare Other | Admitting: Internal Medicine

## 2016-07-20 ENCOUNTER — Encounter: Payer: Self-pay | Admitting: Internal Medicine

## 2016-07-20 ENCOUNTER — Ambulatory Visit (INDEPENDENT_AMBULATORY_CARE_PROVIDER_SITE_OTHER): Payer: Medicare Other | Admitting: Internal Medicine

## 2016-07-20 VITALS — BP 134/60 | Ht 63.0 in | Wt 211.4 lb

## 2016-07-20 DIAGNOSIS — E119 Type 2 diabetes mellitus without complications: Secondary | ICD-10-CM | POA: Diagnosis not present

## 2016-07-20 DIAGNOSIS — Z0000 Encounter for general adult medical examination without abnormal findings: Secondary | ICD-10-CM

## 2016-07-20 LAB — COMPREHENSIVE METABOLIC PANEL
ALT: 10 U/L (ref 0–35)
AST: 14 U/L (ref 0–37)
Albumin: 4.1 g/dL (ref 3.5–5.2)
Alkaline Phosphatase: 107 U/L (ref 39–117)
BUN: 16 mg/dL (ref 6–23)
CO2: 31 mEq/L (ref 19–32)
Calcium: 9.8 mg/dL (ref 8.4–10.5)
Chloride: 103 mEq/L (ref 96–112)
Creatinine, Ser: 1.02 mg/dL (ref 0.40–1.20)
GFR: 69.7 mL/min (ref >60.00)
Glucose, Bld: 121 mg/dL — ABNORMAL HIGH (ref 70–99)
Potassium: 4.3 mEq/L (ref 3.5–5.1)
Sodium: 139 mEq/L (ref 135–145)
Total Bilirubin: 0.4 mg/dL (ref 0.2–1.2)
Total Protein: 7.4 g/dL (ref 6.0–8.3)

## 2016-07-20 LAB — CBC WITH DIFFERENTIAL/PLATELET
Basophils Absolute: 0 10*3/uL (ref 0.0–0.1)
Basophils Relative: 0.4 % (ref 0.0–3.0)
Eosinophils Absolute: 0.1 10*3/uL (ref 0.0–0.7)
Eosinophils Relative: 1.4 % (ref 0.0–5.0)
HCT: 36.9 % (ref 36.0–46.0)
Hemoglobin: 12.1 g/dL (ref 12.0–15.0)
Lymphocytes Relative: 22.5 % (ref 12.0–46.0)
Lymphs Abs: 2 10*3/uL (ref 0.7–4.0)
MCHC: 32.9 g/dL (ref 30.0–36.0)
MCV: 91.7 fl (ref 78.0–100.0)
Monocytes Absolute: 0.6 10*3/uL (ref 0.1–1.0)
Monocytes Relative: 7 % (ref 3.0–12.0)
Neutro Abs: 6 10*3/uL (ref 1.4–7.7)
Neutrophils Relative %: 68.7 % (ref 43.0–77.0)
Platelets: 323 10*3/uL (ref 150.0–400.0)
RBC: 4.02 Mil/uL (ref 3.87–5.11)
RDW: 14.9 % (ref 11.5–15.5)
WBC: 8.8 10*3/uL (ref 4.0–10.5)

## 2016-07-20 LAB — LIPID PANEL
Cholesterol: 147 mg/dL (ref 0–200)
HDL: 59.4 mg/dL (ref >39.00)
LDL Cholesterol: 72 mg/dL (ref 0–99)
NonHDL: 87.99
Total CHOL/HDL Ratio: 2
Triglycerides: 82 mg/dL (ref 0.0–149.0)
VLDL: 16.4 mg/dL (ref 0.0–40.0)

## 2016-07-20 LAB — HEMOGLOBIN A1C: Hgb A1c MFr Bld: 6.6 % — ABNORMAL HIGH (ref 4.6–6.5)

## 2016-07-20 MED ORDER — GLUCOSE BLOOD VI STRP: ORAL_STRIP | 12 refills | Status: DC

## 2016-07-20 MED ORDER — ONETOUCH ULTRASOFT LANCETS MISC: 12 refills | Status: DC

## 2016-07-20 NOTE — Patient Instructions (Signed)
Health Maintenance, Female Adopting a healthy lifestyle and getting preventive care can go a long way to promote health and wellness. Talk with your health care provider about what schedule of regular examinations is right for you. This is a good chance for you to check in with your provider about disease prevention and staying healthy. In between checkups, there are plenty of things you can do on your own. Experts have done a lot of research about which lifestyle changes and preventive measures are most likely to keep you healthy. Ask your health care provider for more information. WEIGHT AND DIET  Eat a healthy diet  Be sure to include plenty of vegetables, fruits, low-fat dairy products, and lean protein.  Do not eat a lot of foods high in solid fats, added sugars, or salt.  Get regular exercise. This is one of the most important things you can do for your health.  Most adults should exercise for at least 150 minutes each week. The exercise should increase your heart rate and make you sweat (moderate-intensity exercise).  Most adults should also do strengthening exercises at least twice a week. This is in addition to the moderate-intensity exercise.  Maintain a healthy weight  Body mass index (BMI) is a measurement that can be used to identify possible weight problems. It estimates body fat based on height and weight. Your health care provider can help determine your BMI and help you achieve or maintain a healthy weight.  For females 20 years of age and older:   A BMI below 18.5 is considered underweight.  A BMI of 18.5 to 24.9 is normal.  A BMI of 25 to 29.9 is considered overweight.  A BMI of 30 and above is considered obese.  Watch levels of cholesterol and blood lipids  You should start having your blood tested for lipids and cholesterol at 66 years of age, then have this test every 5 years.  You may need to have your cholesterol levels checked more often if:  Your lipid  or cholesterol levels are high.  You are older than 66 years of age.  You are at high risk for heart disease.  CANCER SCREENING   Lung Cancer  Lung cancer screening is recommended for adults 55-80 years old who are at high risk for lung cancer because of a history of smoking.  A yearly low-dose CT scan of the lungs is recommended for people who:  Currently smoke.  Have quit within the past 15 years.  Have at least a 30-pack-year history of smoking. A pack year is smoking an average of one pack of cigarettes a day for 1 year.  Yearly screening should continue until it has been 15 years since you quit.  Yearly screening should stop if you develop a health problem that would prevent you from having lung cancer treatment.  Breast Cancer  Practice breast self-awareness. This means understanding how your breasts normally appear and feel.  It also means doing regular breast self-exams. Let your health care provider know about any changes, no matter how small.  If you are in your 20s or 30s, you should have a clinical breast exam (CBE) by a health care provider every 1-3 years as part of a regular health exam.  If you are 40 or older, have a CBE every year. Also consider having a breast X-ray (mammogram) every year.  If you have a family history of breast cancer, talk to your health care provider about genetic screening.  If you   are at high risk for breast cancer, talk to your health care provider about having an MRI and a mammogram every year.  Breast cancer gene (BRCA) assessment is recommended for women who have family members with BRCA-related cancers. BRCA-related cancers include:  Breast.  Ovarian.  Tubal.  Peritoneal cancers.  Results of the assessment will determine the need for genetic counseling and BRCA1 and BRCA2 testing. Cervical Cancer Your health care provider may recommend that you be screened regularly for cancer of the pelvic organs (ovaries, uterus, and  vagina). This screening involves a pelvic examination, including checking for microscopic changes to the surface of your cervix (Pap test). You may be encouraged to have this screening done every 3 years, beginning at age 21.  For women ages 30-65, health care providers may recommend pelvic exams and Pap testing every 3 years, or they may recommend the Pap and pelvic exam, combined with testing for human papilloma virus (HPV), every 5 years. Some types of HPV increase your risk of cervical cancer. Testing for HPV may also be done on women of any age with unclear Pap test results.  Other health care providers may not recommend any screening for nonpregnant women who are considered low risk for pelvic cancer and who do not have symptoms. Ask your health care provider if a screening pelvic exam is right for you.  If you have had past treatment for cervical cancer or a condition that could lead to cancer, you need Pap tests and screening for cancer for at least 20 years after your treatment. If Pap tests have been discontinued, your risk factors (such as having a new sexual partner) need to be reassessed to determine if screening should resume. Some women have medical problems that increase the chance of getting cervical cancer. In these cases, your health care provider may recommend more frequent screening and Pap tests. Colorectal Cancer  This type of cancer can be detected and often prevented.  Routine colorectal cancer screening usually begins at 66 years of age and continues through 66 years of age.  Your health care provider may recommend screening at an earlier age if you have risk factors for colon cancer.  Your health care provider may also recommend using home test kits to check for hidden blood in the stool.  A small camera at the end of a tube can be used to examine your colon directly (sigmoidoscopy or colonoscopy). This is done to check for the earliest forms of colorectal  cancer.  Routine screening usually begins at age 50.  Direct examination of the colon should be repeated every 5-10 years through 66 years of age. However, you may need to be screened more often if early forms of precancerous polyps or small growths are found. Skin Cancer  Check your skin from head to toe regularly.  Tell your health care provider about any new moles or changes in moles, especially if there is a change in a mole's shape or color.  Also tell your health care provider if you have a mole that is larger than the size of a pencil eraser.  Always use sunscreen. Apply sunscreen liberally and repeatedly throughout the day.  Protect yourself by wearing long sleeves, pants, a wide-brimmed hat, and sunglasses whenever you are outside. HEART DISEASE, DIABETES, AND HIGH BLOOD PRESSURE   High blood pressure causes heart disease and increases the risk of stroke. High blood pressure is more likely to develop in:  People who have blood pressure in the high end   of the normal range (130-139/85-89 mm Hg).  People who are overweight or obese.  People who are African American.  If you are 38-23 years of age, have your blood pressure checked every 3-5 years. If you are 61 years of age or older, have your blood pressure checked every year. You should have your blood pressure measured twice--once when you are at a hospital or clinic, and once when you are not at a hospital or clinic. Record the average of the two measurements. To check your blood pressure when you are not at a hospital or clinic, you can use:  An automated blood pressure machine at a pharmacy.  A home blood pressure monitor.  If you are between 45 years and 39 years old, ask your health care provider if you should take aspirin to prevent strokes.  Have regular diabetes screenings. This involves taking a blood sample to check your fasting blood sugar level.  If you are at a normal weight and have a low risk for diabetes,  have this test once every three years after 66 years of age.  If you are overweight and have a high risk for diabetes, consider being tested at a younger age or more often. PREVENTING INFECTION  Hepatitis B  If you have a higher risk for hepatitis B, you should be screened for this virus. You are considered at high risk for hepatitis B if:  You were born in a country where hepatitis B is common. Ask your health care provider which countries are considered high risk.  Your parents were born in a high-risk country, and you have not been immunized against hepatitis B (hepatitis B vaccine).  You have HIV or AIDS.  You use needles to inject street drugs.  You live with someone who has hepatitis B.  You have had sex with someone who has hepatitis B.  You get hemodialysis treatment.  You take certain medicines for conditions, including cancer, organ transplantation, and autoimmune conditions. Hepatitis C  Blood testing is recommended for:  Everyone born from [birth year] through 1966.  Anyone with known risk factors for hepatitis C. Sexually transmitted infections (STIs)  You should be screened for sexually transmitted infections (STIs) including gonorrhea and chlamydia if:  You are sexually active and are younger than 66 years of age.  You are older than 66 years of age and your health care provider tells you that you are at risk for this type of infection.  Your sexual activity has changed since you were last screened and you are at an increased risk for chlamydia or gonorrhea. Ask your health care provider if you are at risk.  If you do not have HIV, but are at risk, it may be recommended that you take a prescription medicine daily to prevent HIV infection. This is called pre-exposure prophylaxis (PrEP). You are considered at risk if:  You are sexually active and do not regularly use condoms or know the HIV status of your partner(s).  You take drugs by injection.  You are sexually  active with a partner who has HIV. Talk with your health care provider about whether you are at high risk of being infected with HIV. If you choose to begin PrEP, you should first be tested for HIV. You should then be tested every 3 months for as long as you are taking PrEP.  PREGNANCY   If you are premenopausal and you may become pregnant, ask your health care provider about preconception counseling.  If you may  become pregnant, take 400 to 800 micrograms (mcg) of folic acid every day.  If you want to prevent pregnancy, talk to your health care provider about birth control (contraception). OSTEOPOROSIS AND MENOPAUSE   Osteoporosis is a disease in which the bones lose minerals and strength with aging. This can result in serious bone fractures. Your risk for osteoporosis can be identified using a bone density scan.  If you are 61 years of age or older, or if you are at risk for osteoporosis and fractures, ask your health care provider if you should be screened.  Ask your health care provider whether you should take a calcium or vitamin D supplement to lower your risk for osteoporosis.  Menopause may have certain physical symptoms and risks.  Hormone replacement therapy may reduce some of these symptoms and risks. Talk to your health care provider about whether hormone replacement therapy is right for you.  HOME CARE INSTRUCTIONS   Schedule regular health, dental, and eye exams.  Stay current with your immunizations.   Do not use any tobacco products including cigarettes, chewing tobacco, or electronic cigarettes.  If you are pregnant, do not drink alcohol.  If you are breastfeeding, limit how much and how often you drink alcohol.  Limit alcohol intake to no more than 1 drink per day for nonpregnant women. One drink equals 12 ounces of beer, 5 ounces of wine, or 1 ounces of hard liquor.  Do not use street drugs.  Do not share needles.  Ask your health care provider for help if  you need support or information about quitting drugs.  Tell your health care provider if you often feel depressed.  Tell your health care provider if you have ever been abused or do not feel safe at home.   This information is not intended to replace advice given to you by your health care provider. Make sure you discuss any questions you have with your health care provider.   Document Released: 06/26/2011 Document Revised: 01/01/2015 Document Reviewed: 11/12/2013 Elsevier Interactive Patient Education [employer].

## 2016-07-20 NOTE — Progress Notes (Signed)
Subjective:    Patient ID: Martha [name], female    DOB: 03/27/1951, 65 y.o.   MRN: [id number]  HPI  65YO female presents for physical exam.  Last PAP 06/2014 normal HPV neg  Feeling well. No concerns today. Exercising at [facility] 5 days per week.  DM - BG near 130s or lower.   Wt Readings from Last 3 Encounters:  07/20/16 211 lb 6.4 oz (95.9 kg)  06/06/16 212 lb (96.2 kg)  03/08/16 208 lb (94.3 kg)   BP Readings from Last 3 Encounters:  07/20/16 134/60  06/06/16 120/60  03/08/16 134/74    Past Medical History:  Diagnosis Date  . Diabetes mellitus without complication ([place])   . Status post dilation of esophageal narrowing 2008   Family History  Problem Relation Age of Onset  . Hypertension Mother   . Heart disease Father 55  . Hypertension Daughter   . Cancer Maternal Aunt 80    breast and ovary   Past Surgical History:  Procedure Laterality Date  . DILATION AND CURETTAGE OF UTERUS     Social History   Social History  . Marital status: Married    Spouse name: N/A  . Number of children: N/A  . Years of education: N/A   Social History Main Topics  . Smoking status: Former Smoker    Quit date: 07/24/1990  . Smokeless tobacco: Never Used  . Alcohol use Yes     Comment: Socially  . Drug use: No  . Sexual activity: Not Asked   Other Topics Concern  . None   Social History Narrative   Lives in [place]. From [place]. Son lives with pt. Dog in home.      Work - [employer], and Theme park manager, now retired.      Diet - regular      Exercise - no regular    Review of Systems  Constitutional: Negative for appetite change, chills, fatigue, fever and unexpected weight change.  Eyes: Negative for visual disturbance.  Respiratory: Negative for cough, chest tightness and shortness of breath.   Cardiovascular: Negative for chest pain, palpitations and leg swelling.  Gastrointestinal: Negative for abdominal pain, constipation, diarrhea, nausea and vomiting.    Genitourinary: Negative for vaginal bleeding, vaginal discharge and vaginal pain.  Musculoskeletal: Negative for arthralgias and myalgias.  Skin: Negative for color change and rash.  Hematological: Negative for adenopathy. Does not bruise/bleed easily.  Psychiatric/Behavioral: Negative for dysphoric mood and sleep disturbance. The patient is not nervous/anxious.        Objective:    BP 134/60 (BP Location: Left Arm, Patient Position: Sitting, Cuff Size: Large)   Ht 5\' 3"  (1.6 m)   Wt 211 lb 6.4 oz (95.9 kg)   SpO2 96%   BMI 37.45 kg/m  Physical Exam  Constitutional: She is oriented to person, place, and time. She appears well-developed and well-nourished. No distress.  HENT:  Head: Normocephalic and atraumatic.  Right Ear: External ear normal.  Left Ear: External ear normal.  Nose: Nose normal.  Mouth/Throat: Oropharynx is clear and moist. No oropharyngeal exudate.  Eyes: Conjunctivae are normal. Pupils are equal, round, and reactive to light. Right eye exhibits no discharge. Left eye exhibits no discharge. No scleral icterus.  Neck: Normal range of motion. Neck supple. No tracheal deviation present. No thyromegaly present.  Cardiovascular: Normal rate, regular rhythm, normal heart sounds and intact distal pulses.  Exam reveals no gallop and no friction rub.   No murmur heard. Pulmonary/Chest: Effort normal  and breath sounds normal. No accessory muscle usage. No tachypnea. No respiratory distress. She has no decreased breath sounds. She has no wheezes. She has no rales. She exhibits no tenderness. Right breast exhibits no inverted nipple, no mass, no nipple discharge, no skin change and no tenderness. Left breast exhibits no inverted nipple, no mass, no nipple discharge, no skin change and no tenderness. Breasts are symmetrical.  Abdominal: Soft. Bowel sounds are normal. She exhibits no distension and no mass. There is no tenderness. There is no rebound and no guarding.   Musculoskeletal: Normal range of motion. She exhibits no edema or tenderness.  Lymphadenopathy:    She has no cervical adenopathy.  Neurological: She is alert and oriented to person, place, and time. No cranial nerve deficit. She exhibits normal muscle tone. Coordination normal.  Skin: Skin is warm and dry. No rash noted. She is not diaphoretic. No erythema. No pallor.  Psychiatric: She has a normal mood and affect. Her behavior is normal. Judgment and thought content normal.          Assessment & Plan:   Problem List Items Addressed This Visit      Unprioritized   Diabetes mellitus type 2, controlled ([place])   Routine general medical examination at a health care facility - Primary (Chronic)    General medical exam normal today including breast exam. PAP and pelvic deferred, per pt preference. Last PAP 2015, HPV neg. Will plan repeat PAP in 2018. Mammogram ordered. Colonoscopy due in 2018. Labs today. Encouraged healthy diet and exercise. Immunizations are UTD.      Relevant Orders   MM Digital Screening   Comprehensive metabolic panel   Hemoglobin A1c   Lipid panel   CBC with Differential/Platelet    Other Visit Diagnoses   None.      Return in about 3 months (around 10/20/2016) for New Patient.  Ronette Deter, MD Internal [facility]

## 2016-07-20 NOTE — Assessment & Plan Note (Signed)
General medical exam normal today including breast exam. PAP and pelvic deferred, per pt preference. Last PAP 2015, HPV neg. Will plan repeat PAP in 2018. Mammogram ordered. Colonoscopy due in 2018. Labs today. Encouraged healthy diet and exercise. Immunizations are UTD.

## 2016-07-21 ENCOUNTER — Telehealth: Payer: Self-pay | Admitting: *Deleted

## 2016-07-21 NOTE — Telephone Encounter (Signed)
She did not have any of these complaints at her visit yesterday? Needs to be seen.

## 2016-07-21 NOTE — Telephone Encounter (Signed)
Please schedule a visit as she had no complaints yesterday and these are new, thanks

## 2016-07-21 NOTE — Telephone Encounter (Signed)
No acute appt slot were available in this office, pt will call [place]

## 2016-07-21 NOTE — Telephone Encounter (Signed)
Patient was seen yesterday for annual visit, no complaints, no see below, please advise, thanks

## 2016-07-21 NOTE — Telephone Encounter (Signed)
Patient was in on yesterday, this morning, she has fever, congestion and pain in chest from cough. She requested to have Rx written for her symptoms . Pharmacy [facility]

## 2016-07-23 DIAGNOSIS — J029 Acute pharyngitis, unspecified: Secondary | ICD-10-CM | POA: Diagnosis not present

## 2016-07-25 ENCOUNTER — Other Ambulatory Visit: Payer: Self-pay | Admitting: Nurse Practitioner

## 2016-07-27 ENCOUNTER — Other Ambulatory Visit: Payer: Self-pay | Admitting: Nurse Practitioner

## 2016-08-22 ENCOUNTER — Ambulatory Visit: Payer: Medicare Other | Attending: Internal Medicine

## 2016-09-13 ENCOUNTER — Telehealth: Payer: Self-pay | Admitting: Internal Medicine

## 2016-09-13 ENCOUNTER — Other Ambulatory Visit: Payer: Self-pay | Admitting: Family Medicine

## 2016-09-13 DIAGNOSIS — E119 Type 2 diabetes mellitus without complications: Secondary | ICD-10-CM

## 2016-09-13 NOTE — Telephone Encounter (Signed)
Was a walker patient, please advise for referral for class, thanks

## 2016-09-13 NOTE — Telephone Encounter (Signed)
Referral placed.

## 2016-09-13 NOTE — Telephone Encounter (Signed)
Pt lvm stating that she needs a referral to a nutritional diabetic class. Please advise.

## 2016-09-20 ENCOUNTER — Ambulatory Visit (INDEPENDENT_AMBULATORY_CARE_PROVIDER_SITE_OTHER): Payer: Medicare Other

## 2016-09-20 VITALS — BP 130/70 | HR 70 | Temp 97.9°F | Resp 14 | Ht 63.0 in | Wt 207.0 lb

## 2016-09-20 DIAGNOSIS — Z0000 Encounter for general adult medical examination without abnormal findings: Secondary | ICD-10-CM

## 2016-09-20 DIAGNOSIS — Z23 Encounter for immunization: Secondary | ICD-10-CM | POA: Diagnosis not present

## 2016-09-20 NOTE — Progress Notes (Signed)
Subjective:   Martha [name] is a 65 y.o. female who presents for Medicare Annual (Subsequent) preventive examination.  Review of Systems:  No ROS.  Medicare Wellness Visit.  Cardiac Risk Factors include: advanced age (>34men, >76 women);diabetes mellitus;obesity (BMI >30kg/m2)     Objective:     Vitals: BP 130/70 (BP Location: Left Arm, Patient Position: Sitting, Cuff Size: Normal)   Pulse 70   Temp 97.9 F (36.6 C) (Oral)   Resp 14   Ht 5\' 3"  (1.6 m)   Wt 207 lb (93.9 kg)   SpO2 97%   BMI 36.67 kg/m   Body mass index is 36.67 kg/m.   Tobacco History  Smoking Status  . Former Smoker  . Quit date: 07/24/1990  Smokeless Tobacco  . Never Used     Counseling given: Not Answered   Past Medical History:  Diagnosis Date  . Diabetes mellitus without complication ([place])   . Status post dilation of esophageal narrowing 2008   Past Surgical History:  Procedure Laterality Date  . DILATION AND CURETTAGE OF UTERUS     Family History  Problem Relation Age of Onset  . Hypertension Mother   . Heart disease Father 17  . Hypertension Daughter   . Cancer Maternal Aunt 80    breast and ovary   History  Sexual Activity  . Sexual activity: Not Currently    Outpatient Encounter Prescriptions as of 09/20/2016  Medication Sig  . aspirin 81 MG tablet Take 81 mg by mouth daily.  . Cholecalciferol (VITAMIN D-3 PO) Take 2,000 Int'l Units by mouth daily.  . fluticasone (FLONASE) 50 MCG/ACT nasal spray Place 2 sprays into both nostrils daily.  Marland Kitchen glucose blood test strip Use as instructed  . Lancets (ONETOUCH ULTRASOFT) lancets Use as instructed  . lisinopril-hydrochlorothiazide (PRINZIDE,ZESTORETIC) 20-12.5 MG tablet Take 1 tablet by mouth daily.  . metFORMIN (GLUCOPHAGE) 500 MG tablet Take 1 tablet (500 mg total) by mouth 2 (two) times daily with a meal.  . naproxen (NAPROSYN) 500 MG tablet Take 500 mg by mouth 2 (two) times daily with a meal.  . simvastatin (ZOCOR) 40 MG  tablet Take 1 tablet (40 mg total) by mouth daily.  . TRIAMCINOLONE ACETONIDE, TOP, 0.05 % OINT Apply topically.   No facility-administered encounter medications on file as of 09/20/2016.     Activities of Daily Living In your present state of health, do you have any difficulty performing the following activities: 09/20/2016  Hearing? N  Vision? N  Difficulty concentrating or making decisions? N  Walking or climbing stairs? N  Dressing or bathing? N  Doing errands, shopping? N  Preparing Food and eating ? N  Using the Toilet? N  In the past six months, have you accidently leaked urine? N  Do you have problems with loss of bowel control? N  Managing your Medications? N  Managing your Finances? N  Housekeeping or managing your Housekeeping? N  Some recent data might be hidden    Patient Care Team: Coral Spikes, DO as PCP - General (Family Medicine)    Assessment:    This is a routine wellness examination for Martha [name]. The goal of the wellness visit is to assist the patient how to close the gaps in care and create a preventative care plan for the patient.   Taking calcium VIT D3 as appropriate/Osteoporosis risk reviewed.  Medications reviewed; taking without issues or barriers.  Safety issues reviewed; alarm system and smoke detectors in the home. No  firearms in the home. Wears seatbelts when driving or riding with others. No violence in the home.  No identified risk were noted; The patient was oriented x 3; appropriate in dress and manner and no objective failures at ADL's or IADL's.   Body mass index; discussed the importance of a healthy diet, water intake and exercise. She is going to attend diabetic teaching classes next month to assist with a healthy diet, she has adequate water intake and exercise regimen at the [facility] 5 days a week.  Educational material provided.  Influenza vaccine administered, L deltoid.  Tolerated well.    Hepatitis C Screening; discussed  educational material provided.  Screening postponed, per patient request, for follow up with PCP at upcoming visit.    Patient Concerns: Assists in caring for her mother who lives alone next door; information provided for services offered to help manage care.    Exercise Activities and Dietary recommendations Current Exercise Habits: Structured exercise class, Type of exercise: calisthenics;treadmill, Time (Minutes): 60, Frequency (Times/Week): 3, Weekly Exercise (Minutes/Week): 180, Intensity: Moderate  Goals    . Healthy Lifestyle          STAY HYDRATED AND DRINK PLENTY OF FLUIDS. STAY ACTIVE AND CONTINUE EXERCISE REGIMEN. LOW CARB FOODS.  LEAN MEATS, VEGETABLES. Diabetic diet educational material provided.      Fall Risk Fall Risk  09/20/2016 07/20/2016  Falls in the past year? No No   Depression Screen PHQ 2/9 Scores 09/20/2016 07/20/2016  PHQ - 2 Score 0 0     Cognitive Testing MMSE - Mini Mental State Exam 09/20/2016  Orientation to time 5  Orientation to Place 5  Registration 3  Attention/ Calculation 5  Recall 3  Language- name 2 objects 2  Language- repeat 1  Language- follow 3 step command 3  Language- read & follow direction 1  Write a sentence 1  Copy design 1  Total score 30    Immunization History  Administered Date(s) Administered  . Influenza Split 10/15/2014  . Influenza,inj,Quad PF,36+ Mos 09/20/2016  . Influenza-Unspecified 10/24/2012, 10/12/2013  . Pneumococcal Conjugate-13 06/21/2015  . Pneumococcal Polysaccharide-23 10/24/2012  . Tdap 07/24/2009   Screening Tests Health Maintenance  Topic Date Due  . Hepatitis C Screening  1951-02-16  . ZOSTAVAX  06/05/2010  . FOOT EXAM  06/20/2016  . MAMMOGRAM  10/12/2016  . HEMOGLOBIN A1C  01/20/2017  . COLONOSCOPY  07/24/2017  . OPHTHALMOLOGY EXAM  09/06/2017  . PNA vac Low Risk Adult (2 of 2 - PPSV23) 10/24/2017  . TETANUS/TDAP  07/25/2019  . INFLUENZA VACCINE  Completed  . DEXA SCAN  Completed       Plan:   End of life planning; Advance aging; Advanced directives discussed. No current HCPOA/Living Will.  Educational material provided to help her start the conversation with her family.  Time spent discussing this topic is 23 minutes.  [place] [facility] for diabetic teaching Sep 27, 2016.  [employer] [phone].  Mammogram as directed.  Follow up scheduled A1C, HepC, foot exam.  Medicare Attestation I have personally reviewed: The patient's medical and social history Their use of alcohol, tobacco or illicit drugs Their current medications and supplements The patient's functional ability including ADLs,fall risks, home safety risks, cognitive, and hearing and visual impairment Diet and physical activities Evidence for depression   The patient's weight, height, BMI, and visual acuity have been recorded in the chart.  I have made referrals and provided education to the patient based on review of  the above and I have provided the patient with a written personalized care plan for preventive services.    During the course of the visit the patient was educated and counseled about the following appropriate screening and preventive services:   Vaccines to include Pneumoccal, Influenza, Hepatitis B, Td, Zostavax, HCV  Electrocardiogram  Cardiovascular Disease  Colorectal cancer screening  Bone density screening  Diabetes screening  Glaucoma screening  Mammography/PAP  Nutrition counseling   Patient Instructions (the written plan) was given to the patient.   Varney Biles, LPN  [phone]

## 2016-09-20 NOTE — Progress Notes (Signed)
I have reviewed the above and agree.  Tommi Rumps, M.D.

## 2016-09-20 NOTE — Patient Instructions (Addendum)
Martha [name] , Thank you for taking time to come for your Medicare Wellness Visit. I appreciate your ongoing commitment to your health goals. Please review the following plan we discussed and let me know if I can assist you in the future.   Follow up with Dr. Thersa Salt as needed.  [place] [facility] for diabetic teaching Sep 27, 2016.  [employer] [phone].  Mammogram as directed.  Follow up scheduled A1C, HepC, foot exam.    These are the goals we discussed: Goals    . Healthy Lifestyle          STAY HYDRATED AND DRINK PLENTY OF FLUIDS. STAY ACTIVE AND CONTINUE EXERCISE REGIMEN. LOW CARB FOODS.  LEAN MEATS, VEGETABLES. Diabetic diet educational material provided.       This is a list of the screening recommended for you and due dates:  Health Maintenance  Topic Date Due  .  Hepatitis C: One time screening is recommended by Center for Disease Control  (CDC) for  adults born from [birth year] through 1966.   December 18, 1951  . Shingles Vaccine  06/05/2010  . Complete foot exam   06/20/2016  . Mammogram  10/12/2016  . Hemoglobin A1C  01/20/2017  . Colon Cancer Screening  07/24/2017  . Eye exam for diabetics  09/06/2017  . Pneumonia vaccines (2 of 2 - PPSV23) 10/24/2017  . Tetanus Vaccine  07/25/2019  . Flu Shot  Completed  . DEXA scan (bone density measurement)  Completed    Screening for Type 2 Diabetes Screening is a way to check for type 2 diabetes in people who do not have symptoms of the disease, but who may likely develop diabetes in the future. Diabetes can lead to serious health problems, but finding diabetes early allows for early treatment. DIABETES RISK FACTORS   Family history of diabetes.  Diseases of the pancreas.  Obesity or being overweight.  Certain racial or ethnic groups:  American [place].  Pacific Islander.  Hispanic.  Asian.  African American.  High blood pressure (hypertension).  History of diabetes while pregnant (gestational  diabetes).  Delivering a baby that weighed over 9 pounds.  Being inactive.  High cholesterol or triglycerides.  Age, especially over 44 years of age.  Other diseases or conditions.  Diseases of the pancreas.  Cardiovascular disease.  Disorders of the endocrine system.  Certain medicines, such as those that treat high blood cholesterol levels. WHO IS SCREENED Adults  Adults who have no risk factors and no symptoms should be screened starting at age 34. If the screening tests are normal, they should be repeated every 3 years.  Adults who do not have symptoms, but have 1 or more risk factors, should be screened.  Adults who have 2 or more risk factors may be screened every year.  Adults who have an A1c (3 month average of blood glucose) greater than 5.7% or who had an impaired glucose tolerance (IGT) or impaired fasting glucose (IFG) on a previous test should be screened.  Pregnant women who have risk factors should be screened at their first prenatal visit.  Women who have given birth and had gestational diabetes should be screened 6-12 weeks after the child is born. This screening should be repeated every 1-3 years after the first test. Children or Adolescents  Children and adolescents should be screened for type 2 diabetes if they are overweight and have 2 of the following risk factors:  Having a family history of type 2 diabetes.  Being a  member of a high risk race or ethnic group.  Having signs of insulin resistance or conditions associated with insulin resistance.  Having a mother who had gestational diabetes while pregnant with him or her.  Screening should start at age 73 or at the onset of puberty, whichever comes first. This should be repeated every 2 years. SCREENING In a screening, your caregiver may:  Ask questions about your overall health. This will include questions about the health of close family members, too.  Ask about any diabetes-like symptoms you  may have.  Perform a physical exam.  Order some tests that may include:  A fasting plasma glucose test. This measures the level of glucose in your blood. It is done after you have had nothing to eat but water (fasted) for 8 hours.  A random blood glucose test. This test is done without the need to fast.  An oral glucose tolerance test. This is a blood test done in 2 parts. First, a blood sample is taken after you have fasted. Then, another sample is taken after you drink a liquid that contains a lot of sugar.  An A1c test. This test shows how much glucose has been in your blood over the past 2 to 3 months.   This information is not intended to replace advice given to you by your health care provider. Make sure you discuss any questions you have with your health care provider.   Document Released: 10/07/2009 Document Revised: 01/01/2015 Document Reviewed: 07/19/2011 Elsevier Interactive Patient Education 2016 [place] A mammogram is an X-ray of the breasts that is done to check for abnormal changes. This procedure can screen for and detect any changes that may suggest breast cancer. A mammogram can also identify other changes and variations in the breast, such as:  Inflammation of the breast tissue (mastitis).  An infected area that contains a collection of pus (abscess).  A fluid-filled sac (cyst).  Fibrocystic changes. This is when breast tissue becomes denser, which can make the tissue feel rope-like or uneven under the skin.  Tumors that are not cancerous (benign). LET [facility] CARE PROVIDER KNOW ABOUT:  Any allergies you have.  If you have breast implants.  If you have had previous breast disease, biopsy, or surgery.  If you are breastfeeding.  Any possibility that you could be pregnant, if this applies.  If you are younger than age 16.  If you have a family history of breast cancer. RISKS AND COMPLICATIONS Generally, this is a safe procedure.  However, problems may occur, including:  Exposure to radiation. Radiation levels are very low with this test.  The results being misinterpreted.  The need for further tests.  The inability of the mammogram to detect certain cancers. BEFORE THE PROCEDURE  Schedule your test about 1-2 weeks after your menstrual period. This is usually when your breasts are the least tender.  If you have had a mammogram done at a different facility in the past, get the mammogram X-rays or have them sent to your current exam facility in order to compare them.  Wash your breasts and under your arms the day of the test.  Do not wear deodorants, perfumes, lotions, or powders anywhere on your body on the day of the test.  Remove any jewelry from your neck.  Wear clothes that you can change into and out of easily. PROCEDURE  You will undress from the waist up and put on a gown.  You will stand in  front of the X-ray machine.  Each breast will be placed between two plastic or glass plates. The plates will compress your breast for a few seconds. Try to stay as relaxed as possible during the procedure. This does not cause any harm to your breasts and any discomfort you feel will be very brief.  X-rays will be taken from different angles of each breast. The procedure may vary among health care providers and hospitals. AFTER THE PROCEDURE  The mammogram will be examined by a specialist (radiologist).  You may need to repeat certain parts of the test, depending on the quality of the images. This is commonly done if the radiologist needs a better view of the breast tissue.  Ask when your test results will be ready. Make sure you get your test results.  You may resume your normal activities.   This information is not intended to replace advice given to you by your health care provider. Make sure you discuss any questions you have with your health care provider.   Document Released: 12/08/2000 Document  Revised: 09/01/2015 Document Reviewed: 02/19/2015 Elsevier Interactive Patient Education [employer].

## 2016-10-03 ENCOUNTER — Ambulatory Visit
Admission: RE | Admit: 2016-10-03 | Discharge: 2016-10-03 | Disposition: A | Discharge status: Home / Self Care | Payer: Medicare Other | Source: Ambulatory Visit | Attending: Internal Medicine | Admitting: Internal Medicine

## 2016-10-03 ENCOUNTER — Other Ambulatory Visit: Payer: Self-pay | Admitting: Internal Medicine

## 2016-10-03 DIAGNOSIS — R928 Other abnormal and inconclusive findings on diagnostic imaging of breast: Secondary | ICD-10-CM | POA: Diagnosis not present

## 2016-10-03 DIAGNOSIS — Z1231 Encounter for screening mammogram for malignant neoplasm of breast: Secondary | ICD-10-CM | POA: Diagnosis not present

## 2016-10-03 DIAGNOSIS — Z0000 Encounter for general adult medical examination without abnormal findings: Secondary | ICD-10-CM

## 2016-10-04 ENCOUNTER — Other Ambulatory Visit: Payer: Self-pay | Admitting: Family Medicine

## 2016-10-04 DIAGNOSIS — N6489 Other specified disorders of breast: Secondary | ICD-10-CM

## 2016-10-16 ENCOUNTER — Encounter: Payer: Self-pay | Admitting: Family Medicine

## 2016-10-16 ENCOUNTER — Ambulatory Visit (INDEPENDENT_AMBULATORY_CARE_PROVIDER_SITE_OTHER): Payer: Medicare Other | Admitting: Family Medicine

## 2016-10-16 VITALS — BP 140/70 | HR 85 | Temp 98.0°F | Wt 207.4 lb

## 2016-10-16 DIAGNOSIS — E119 Type 2 diabetes mellitus without complications: Secondary | ICD-10-CM | POA: Diagnosis not present

## 2016-10-16 DIAGNOSIS — E785 Hyperlipidemia, unspecified: Secondary | ICD-10-CM | POA: Diagnosis not present

## 2016-10-16 DIAGNOSIS — I10 Essential (primary) hypertension: Secondary | ICD-10-CM | POA: Diagnosis not present

## 2016-10-16 MED ORDER — ZOSTER VACCINE LIVE 19400 UNT/0.65ML ~~LOC~~ SUSR: 0.6500 mL | Freq: Once | SUBCUTANEOUS | 0 refills | Status: AC

## 2016-10-16 NOTE — Patient Instructions (Signed)
We will call with your results.  Continue your medications.  Follow up in 3-6 months.  Take care  Dr. Lacinda Axon

## 2016-10-16 NOTE — Assessment & Plan Note (Signed)
Well controlled and at goal. Continue dietary changes and exercise. Also continue metformin.

## 2016-10-16 NOTE — Progress Notes (Signed)
Pre visit review using our clinic review tool, if applicable. No additional management support is needed unless otherwise documented below in the visit note. 

## 2016-10-16 NOTE — Progress Notes (Signed)
Subjective:  Patient ID: Martha [name], female    DOB: March 04, 1951  Age: 65 y.o. MRN: [id number]  CC: Follow up  HPI:  65 year old female with HTN, HLD, and DM-2 presents for follow up.  HTN  Well controlled on Lisinopril/HCTZ..  No side effects.  HLD  Well controlled on Simvastatin.  DM-2  Well controlled.  Exercising and watching diet.  Taking Metformin 500 mg daily.  Social Hx   Social History   Social History  . Marital status: Married    Spouse name: N/A  . Number of children: N/A  . Years of education: N/A   Social History Main Topics  . Smoking status: Former Smoker    Quit date: 07/24/1990  . Smokeless tobacco: Never Used  . Alcohol use Yes     Comment: Socially  . Drug use: No  . Sexual activity: Not Currently   Other Topics Concern  . None   Social History Narrative   Lives in [place]. From [place]. Son lives with pt. Dog in home.      Work - [employer], and Theme park manager, now retired.      Diet - regular      Exercise - no regular   Review of Systems  Constitutional: Negative.   Endocrine: Negative.    Objective:  BP 140/70 (BP Location: Right Arm, Patient Position: Sitting, Cuff Size: Large)   Pulse 85   Temp 98 F (36.7 C) (Oral)   Wt 207 lb 6 oz (94.1 kg)   SpO2 96%   BMI 36.73 kg/m   BP/Weight 10/16/2016 09/20/2016 [id number]  Systolic BP [id number]  Diastolic BP 70 70 60  Wt. (Lbs) 207.38 207 211.4  BMI 36.73 36.67 37.45   Physical Exam  Constitutional: She is oriented to person, place, and time. She appears well-developed. No distress.  Cardiovascular: Normal rate and regular rhythm.   Pulmonary/Chest: Effort normal. She has no wheezes. She has no rales.  Abdominal: Soft. She exhibits no distension. There is no tenderness. There is no rebound and no guarding.  Neurological: She is alert and oriented to person, place, and time.  Psychiatric: She has a normal mood and affect.  Vitals reviewed.  Lab Results  Component Value  Date   WBC 8.8 07/20/2016   HGB 12.1 07/20/2016   HCT 36.9 07/20/2016   PLT 323.0 07/20/2016   GLUCOSE 121 (H) 07/20/2016   CHOL 147 07/20/2016   TRIG 82.0 07/20/2016   HDL 59.40 07/20/2016   LDLCALC 72 07/20/2016   ALT 10 07/20/2016   AST 14 07/20/2016   NA 139 07/20/2016   K 4.3 07/20/2016   CL 103 07/20/2016   CREATININE 1.02 07/20/2016   BUN 16 07/20/2016   CO2 31 07/20/2016   HGBA1C 6.6 (H) 07/20/2016   MICROALBUR 2.8 (H) 02/21/2016    Assessment & Plan:   Problem List Items Addressed This Visit    Hyperlipidemia    Well controlled. Continue simvastatin.      Essential hypertension, benign - Primary    Well controlled. Continue lisinopril/HCTZ.      Diabetes mellitus type 2, controlled ([place])    Well controlled and at goal. Continue dietary changes and exercise. Also continue metformin.      Relevant Orders   HgB A1c    Other Visit Diagnoses   None.     Meds ordered this encounter  Medications  . Zoster Vaccine Live, PF, (ZOSTAVAX) 09811 UNT/0.65ML injection    Sig: Inject 19,400  Units into the skin once.    Dispense:  1 each    Refill:  0    Follow-up: Return for 3-6 months. [place]

## 2016-10-16 NOTE — Assessment & Plan Note (Signed)
Well controlled. Continue lisinopril/HCTZ.

## 2016-10-16 NOTE — Assessment & Plan Note (Signed)
Well-controlled.  Continue simvastatin. 

## 2016-10-17 ENCOUNTER — Ambulatory Visit
Admission: RE | Admit: 2016-10-17 | Discharge: 2016-10-17 | Disposition: A | Discharge status: Home / Self Care | Payer: Medicare Other | Source: Ambulatory Visit | Attending: Family Medicine | Admitting: Family Medicine

## 2016-10-17 DIAGNOSIS — N6310 Unspecified lump in the right breast, unspecified quadrant: Secondary | ICD-10-CM | POA: Diagnosis not present

## 2016-10-17 DIAGNOSIS — N6489 Other specified disorders of breast: Secondary | ICD-10-CM | POA: Diagnosis present

## 2016-10-18 ENCOUNTER — Other Ambulatory Visit: Payer: Self-pay | Admitting: Family Medicine

## 2016-10-18 DIAGNOSIS — N6310 Unspecified lump in the right breast, unspecified quadrant: Secondary | ICD-10-CM

## 2016-10-19 ENCOUNTER — Telehealth: Payer: Self-pay | Admitting: Family Medicine

## 2016-10-19 NOTE — Telephone Encounter (Signed)
Pt called returnig your call. Thank you!  Call pt @ [phone]

## 2016-10-19 NOTE — Telephone Encounter (Signed)
Pt notified please see results note.

## 2016-10-24 ENCOUNTER — Other Ambulatory Visit (INDEPENDENT_AMBULATORY_CARE_PROVIDER_SITE_OTHER): Payer: Medicare Other

## 2016-10-24 DIAGNOSIS — E119 Type 2 diabetes mellitus without complications: Secondary | ICD-10-CM | POA: Diagnosis not present

## 2016-10-24 LAB — HEMOGLOBIN A1C: Hgb A1c MFr Bld: 6.5 % (ref 4.6–6.5)

## 2016-11-02 ENCOUNTER — Ambulatory Visit
Admission: RE | Admit: 2016-11-02 | Discharge: 2016-11-02 | Disposition: A | Discharge status: Home / Self Care | Payer: Medicare Other | Source: Ambulatory Visit | Attending: Family Medicine | Admitting: Family Medicine

## 2016-11-02 DIAGNOSIS — N6310 Unspecified lump in the right breast, unspecified quadrant: Secondary | ICD-10-CM | POA: Diagnosis not present

## 2016-11-02 HISTORY — PX: BREAST BIOPSY: SHX20

## 2016-11-03 ENCOUNTER — Telehealth: Payer: Self-pay | Admitting: Family Medicine

## 2016-11-03 ENCOUNTER — Other Ambulatory Visit: Payer: Self-pay | Admitting: Family Medicine

## 2016-11-03 DIAGNOSIS — R897 Abnormal histological findings in specimens from other organs, systems and tissues: Secondary | ICD-10-CM

## 2016-11-03 LAB — SURGICAL PATHOLOGY

## 2016-11-03 NOTE — Telephone Encounter (Signed)
Referral to Gen surg placed

## 2016-11-03 NOTE — Telephone Encounter (Signed)
Dr.Jarosz from [facility] called stating that pts biopsy came back a-typical capillary lesion can not exclude DCIS. Another nodule was found and they are recommending pt to see gener surgery to have second nodule removed due to the results from today.

## 2016-11-07 ENCOUNTER — Ambulatory Visit (INDEPENDENT_AMBULATORY_CARE_PROVIDER_SITE_OTHER): Payer: Medicare Other | Admitting: General Surgery

## 2016-11-07 ENCOUNTER — Encounter: Payer: Self-pay | Admitting: General Surgery

## 2016-11-07 ENCOUNTER — Ambulatory Visit: Payer: Self-pay | Admitting: General Surgery

## 2016-11-07 VITALS — BP 128/68 | HR 64 | Resp 12 | Ht 63.0 in | Wt 205.0 lb

## 2016-11-07 DIAGNOSIS — D379 Neoplasm of uncertain behavior of digestive organ, unspecified: Secondary | ICD-10-CM | POA: Diagnosis not present

## 2016-11-07 NOTE — Patient Instructions (Addendum)
The patient is aware to call back for any questions or concerns.  The patient is scheduled for surgery at [facility] on 11/20/16. She will pre admit by phone. The patient is aware of date and instructions.

## 2016-11-07 NOTE — Addendum Note (Signed)
Addended by: [name] on: 11/07/2016 10:01 AM   Modules accepted: Orders, SmartSet

## 2016-11-07 NOTE — Progress Notes (Signed)
Patient ID: Martha [name], female   DOB: 1951-02-28, 65 y.o.   MRN: [id number]  Chief Complaint  Patient presents with  . Breast Problem    HPI Martha [name] is a 65 y.o. female who presents for a breast evaluation. The most recent mammogram was done on 10-03-16. Additional views and biopsy was 11-02-16 showing atypical papillary lesion right breast. Denies any breast injury or trauma.  Patient does perform regular self breast checks and gets regular mammograms done.   Patient states last colonoscopy was done in [place] in 2008, showing a colon polyp. I have reviewed the history of present illness with the patient.    HPI  Past Medical History:  Diagnosis Date  . Colon polyp 2008  . Diabetes mellitus without complication ([place])   . Status post dilation of esophageal narrowing 2008    Past Surgical History:  Procedure Laterality Date  . BREAST BIOPSY Right 11/02/2016   ATYPICAL PAPILLARY LESION,  . COLONOSCOPY  2008   in [place]  . DILATION AND CURETTAGE OF UTERUS      Family History  Problem Relation Age of Onset  . Hypertension Mother   . Heart disease Father 31  . Hypertension Daughter   . Cancer Maternal Aunt 73    breast and ovary    Social History Social History  Substance Use Topics  . Smoking status: Former Smoker    Quit date: 07/24/1990  . Smokeless tobacco: Never Used  . Alcohol use Yes     Comment: Socially    No Known Allergies  Current Outpatient Prescriptions  Medication Sig Dispense Refill  . aspirin 81 MG tablet Take 81 mg by mouth daily.    . Cholecalciferol (VITAMIN D-3 PO) Take 2,000 Int'l Units by mouth daily.    . fluticasone (FLONASE) 50 MCG/ACT nasal spray Place 2 sprays into both nostrils daily. (Patient taking differently: Place 2 sprays into both nostrils as needed. ) 16 g 2  . glucose blood test strip Use as instructed 100 each 12  . Lancets (ONETOUCH ULTRASOFT) lancets Use as instructed 100 each 12  .  lisinopril-hydrochlorothiazide (PRINZIDE,ZESTORETIC) 20-12.5 MG tablet Take 1 tablet by mouth daily. 90 tablet 3  . metFORMIN (GLUCOPHAGE) 500 MG tablet Take 1 tablet (500 mg total) by mouth 2 (two) times daily with a meal. 180 tablet 3  . naproxen (NAPROSYN) 500 MG tablet Take 500 mg by mouth as needed.     . simvastatin (ZOCOR) 40 MG tablet Take 1 tablet (40 mg total) by mouth daily. 90 tablet 3  . TRIAMCINOLONE ACETONIDE, TOP, 0.05 % OINT Apply topically as needed.      No current facility-administered medications for this visit.     Review of Systems Review of Systems  Constitutional: Negative.   Respiratory: Negative.   Cardiovascular: Negative.     Blood pressure 128/68, pulse 64, resp. rate 12, height 5\' 3"  (1.6 m), weight 205 lb (93 kg).  Physical Exam Physical Exam  Constitutional: She is oriented to person, place, and time. She appears well-developed and well-nourished.  HENT:  Mouth/Throat: Oropharynx is clear and moist.  Eyes: Conjunctivae are normal. No scleral icterus.  Neck: Neck supple.  Cardiovascular: Normal rate, regular rhythm and normal heart sounds.   Pulmonary/Chest: Effort normal and breath sounds normal. Right breast exhibits no inverted nipple, no mass, no nipple discharge, no skin change and no tenderness. Left breast exhibits no inverted nipple, no mass, no nipple discharge, no skin change and no  tenderness.  Abdominal: Soft. Normal appearance. She exhibits no mass. There is no tenderness.  Lymphadenopathy:    She has no cervical adenopathy.    She has no axillary adenopathy.  Neurological: She is alert and oriented to person, place, and time.  Skin: Skin is warm and dry.  Psychiatric: She has a normal mood and affect. Her behavior is normal.    Data Reviewed Mammogram and pathology reviewed.  Assessment    Atypical papillary lesion right breast Can not rule out DCIS      Plan    Right breast lumpectomy. Procedure, reason, risks and benefits  explained and she is agreeable Further treatment will be based on final path report.    The patient is scheduled for surgery at [facility] on 11/20/16. She will pre admit by phone. The patient is aware of date and instructions.   This information has been scribed by Karie Fetch RN, BSN,BC.  Brittanyann Wittner G 11/07/2016, 9:34 AM

## 2016-11-10 NOTE — Patient Instructions (Addendum)
  Your procedure is scheduled on: 11-20-16 [facility]) Report to Same Day Surgery 2nd floor medical mall To find out your arrival time please call [phone] between [place] on 11-17-16 (FRIDAY)  Remember: Instructions that are not followed completely may result in serious medical risk, up to and including death, or upon the discretion of your surgeon and anesthesiologist your surgery may need to be rescheduled.    _x___ 1. Do not eat food or drink liquids after midnight. No gum chewing or hard candies.     __x__ 2. No Alcohol for 24 hours before or after surgery.   __x__3. No Smoking for 24 prior to surgery.   ____  4. Bring all medications with you on the day of surgery if instructed.    __x__ 5. Notify your doctor if there is any change in your medical condition     (cold, fever, infections).     Do not wear jewelry, make-up, hairpins, clips or nail polish.  Do not wear lotions, powders, or perfumes. You may wear deodorant.  Do not shave 48 hours prior to surgery. Men may shave face and neck.  Do not bring valuables to the hospital.    [facility] is not responsible for any belongings or valuables.               Contacts, dentures or bridgework may not be worn into surgery.  Leave your suitcase in the car. After surgery it may be brought to your room.  For patients admitted to the hospital, discharge time is determined by your treatment team.   Patients discharged the day of surgery will not be allowed to drive home.    Please read over the following fact sheets that you were given:   [facility] Preparing for Surgery and or MRSA Information   _x___ Take these medicines the morning of surgery with A SIP OF WATER:    1. ZOCOR (SIMVASTATIN)  2.   3.  4.  5.  6.  ____Fleets enema or Magnesium Citrate as directed.   _x___ Use CHG Soap or sage wipes as directed on instruction sheet   ____ Use inhalers on the day of surgery and bring to hospital day of surgery  _X___  Stop metformin 2 days prior to surgery-LAST DOSE ON Friday, November 24TH    ____ Take 1/2 of usual insulin dose the night before surgery and none on the morning of surgery.   _X___ Stop aspirin or coumadin, or plavix-PT HAS ALREADY STOPPED ASPIRIN  x__ Stop Anti-inflammatories such as Advil, Aleve, Ibuprofen, Motrin, Naproxen,          Naprosyn, Goodies powders or aspirin products STOP NAPROXEN 7 DAYS PRIOR TO SURGERY-Ok to take Tylenol.   _X___ Stop supplements until after surgery-STOP BIOTIN NOW  ____ Bring C-Pap to the hospital.

## 2016-11-13 ENCOUNTER — Encounter
Admission: RE | Admit: 2016-11-13 | Discharge: 2016-11-13 | Disposition: A | Discharge status: Home / Self Care | Payer: Medicare Other | Source: Ambulatory Visit | Attending: General Surgery | Admitting: General Surgery

## 2016-11-13 DIAGNOSIS — Z01810 Encounter for preprocedural cardiovascular examination: Secondary | ICD-10-CM | POA: Diagnosis present

## 2016-11-13 DIAGNOSIS — I10 Essential (primary) hypertension: Secondary | ICD-10-CM | POA: Diagnosis not present

## 2016-11-13 DIAGNOSIS — Z01812 Encounter for preprocedural laboratory examination: Secondary | ICD-10-CM | POA: Diagnosis not present

## 2016-11-13 HISTORY — DX: Headache: R51

## 2016-11-13 HISTORY — DX: Gastro-esophageal reflux disease without esophagitis: K21.9

## 2016-11-13 HISTORY — DX: Unspecified osteoarthritis, unspecified site: M19.90

## 2016-11-13 HISTORY — DX: Essential (primary) hypertension: I10

## 2016-11-13 HISTORY — DX: Headache, unspecified: R51.9

## 2016-11-13 LAB — BASIC METABOLIC PANEL
Anion gap: 6 (ref 5–15)
BUN: 18 mg/dL (ref 6–20)
CO2: 30 mmol/L (ref 22–32)
Calcium: 9.7 mg/dL (ref 8.9–10.3)
Chloride: 102 mmol/L (ref 101–111)
Creatinine, Ser: 0.91 mg/dL (ref 0.44–1.00)
GFR calc Af Amer: >60 mL/min (ref >60)
GFR calc non Af Amer: >60 mL/min (ref >60)
Glucose, Bld: 90 mg/dL (ref 65–99)
Potassium: 4.4 mmol/L (ref 3.5–5.1)
Sodium: 138 mmol/L (ref 135–145)

## 2016-11-20 ENCOUNTER — Ambulatory Visit
Admission: RE | Admit: 2016-11-20 | Discharge: 2016-11-20 | Disposition: A | Discharge status: Home / Self Care | Payer: Medicare Other | Source: Ambulatory Visit | Attending: General Surgery | Admitting: General Surgery

## 2016-11-20 ENCOUNTER — Encounter
Admission: RE | Disposition: A | Discharge status: Home / Self Care | Payer: Self-pay | Source: Ambulatory Visit | Attending: General Surgery

## 2016-11-20 ENCOUNTER — Ambulatory Visit: Payer: Medicare Other | Admitting: Certified Registered Nurse Anesthetist

## 2016-11-20 ENCOUNTER — Encounter: Payer: Self-pay | Admitting: *Deleted

## 2016-11-20 DIAGNOSIS — N6091 Unspecified benign mammary dysplasia of right breast: Secondary | ICD-10-CM

## 2016-11-20 DIAGNOSIS — D4861 Neoplasm of uncertain behavior of right breast: Secondary | ICD-10-CM | POA: Diagnosis not present

## 2016-11-20 DIAGNOSIS — Z7984 Long term (current) use of oral hypoglycemic drugs: Secondary | ICD-10-CM | POA: Diagnosis not present

## 2016-11-20 DIAGNOSIS — Z87891 Personal history of nicotine dependence: Secondary | ICD-10-CM | POA: Diagnosis not present

## 2016-11-20 DIAGNOSIS — D241 Benign neoplasm of right breast: Secondary | ICD-10-CM | POA: Insufficient documentation

## 2016-11-20 DIAGNOSIS — I10 Essential (primary) hypertension: Secondary | ICD-10-CM | POA: Insufficient documentation

## 2016-11-20 DIAGNOSIS — D369 Benign neoplasm, unspecified site: Secondary | ICD-10-CM

## 2016-11-20 DIAGNOSIS — Z79899 Other long term (current) drug therapy: Secondary | ICD-10-CM | POA: Insufficient documentation

## 2016-11-20 DIAGNOSIS — E119 Type 2 diabetes mellitus without complications: Secondary | ICD-10-CM | POA: Insufficient documentation

## 2016-11-20 DIAGNOSIS — D379 Neoplasm of uncertain behavior of digestive organ, unspecified: Secondary | ICD-10-CM

## 2016-11-20 DIAGNOSIS — Z7982 Long term (current) use of aspirin: Secondary | ICD-10-CM | POA: Insufficient documentation

## 2016-11-20 HISTORY — PX: BREAST LUMPECTOMY: SHX2

## 2016-11-20 HISTORY — DX: Benign neoplasm, unspecified site: D36.9

## 2016-11-20 HISTORY — PX: BREAST EXCISIONAL BIOPSY: SUR124

## 2016-11-20 HISTORY — DX: Unspecified benign mammary dysplasia of right breast: N60.91

## 2016-11-20 LAB — GLUCOSE, CAPILLARY
Glucose-Capillary: 106 mg/dL — ABNORMAL HIGH (ref 65–99)
Glucose-Capillary: 103 mg/dL — ABNORMAL HIGH (ref 65–99)

## 2016-11-20 SURGERY — BREAST LUMPECTOMY
Anesthesia: General | Laterality: Right | Wound class: Clean

## 2016-11-20 MED ORDER — ONDANSETRON HCL 4 MG/2ML IJ SOLN
INTRAMUSCULAR | Status: DC | PRN
  Administered 2016-11-20: 4 mg via INTRAVENOUS

## 2016-11-20 MED ORDER — GENTAMICIN SULFATE 40 MG/ML IJ SOLN: INTRAVENOUS | Status: DC | PRN

## 2016-11-20 MED ORDER — ACETAMINOPHEN 10 MG/ML IV SOLN
INTRAVENOUS | Status: DC | PRN
  Administered 2016-11-20: 1000 mg via INTRAVENOUS

## 2016-11-20 MED ORDER — BUPIVACAINE HCL (PF) 0.5 % IJ SOLN
INTRAMUSCULAR | Status: AC
  Filled 2016-11-20: qty 30

## 2016-11-20 MED ORDER — ONDANSETRON HCL 4 MG/2ML IJ SOLN: 4.0000 mg | Freq: Once | INTRAMUSCULAR | Status: DC | PRN

## 2016-11-20 MED ORDER — FENTANYL CITRATE (PF) 100 MCG/2ML IJ SOLN
INTRAMUSCULAR | Status: DC | PRN
  Administered 2016-11-20 (×4): 25 ug via INTRAVENOUS

## 2016-11-20 MED ORDER — CHLORHEXIDINE GLUCONATE CLOTH 2 % EX PADS: 6.0000 | MEDICATED_PAD | Freq: Once | CUTANEOUS | Status: DC

## 2016-11-20 MED ORDER — PHENYLEPHRINE HCL 10 MG/ML IJ SOLN
INTRAMUSCULAR | Status: DC | PRN
  Administered 2016-11-20: 100 ug via INTRAVENOUS

## 2016-11-20 MED ORDER — KETOROLAC TROMETHAMINE 30 MG/ML IJ SOLN
INTRAMUSCULAR | Status: DC | PRN
  Administered 2016-11-20: 15 mg via INTRAVENOUS

## 2016-11-20 MED ORDER — PROPOFOL 10 MG/ML IV BOLUS
INTRAVENOUS | Status: DC | PRN
  Administered 2016-11-20: 160 mg via INTRAVENOUS

## 2016-11-20 MED ORDER — MIDAZOLAM HCL 2 MG/2ML IJ SOLN
INTRAMUSCULAR | Status: DC | PRN
  Administered 2016-11-20: 2 mg via INTRAVENOUS

## 2016-11-20 MED ORDER — DEXAMETHASONE SODIUM PHOSPHATE 10 MG/ML IJ SOLN
INTRAMUSCULAR | Status: DC | PRN
  Administered 2016-11-20: 4 mg via INTRAVENOUS

## 2016-11-20 MED ORDER — FAMOTIDINE 20 MG PO TABS
ORAL_TABLET | ORAL | Status: AC
  Filled 2016-11-20: qty 1

## 2016-11-20 MED ORDER — LIDOCAINE HCL (CARDIAC) 20 MG/ML IV SOLN
INTRAVENOUS | Status: DC | PRN
  Administered 2016-11-20: 50 mg via INTRAVENOUS

## 2016-11-20 MED ORDER — FENTANYL CITRATE (PF) 100 MCG/2ML IJ SOLN: 25.0000 ug | INTRAMUSCULAR | Status: DC | PRN

## 2016-11-20 MED ORDER — ACETAMINOPHEN 10 MG/ML IV SOLN
INTRAVENOUS | Status: AC
  Filled 2016-11-20: qty 100

## 2016-11-20 MED ORDER — TRAMADOL HCL 50 MG PO TABS: 50.0000 mg | ORAL_TABLET | Freq: Four times a day (QID) | ORAL | 0 refills | Status: DC | PRN

## 2016-11-20 MED ORDER — FAMOTIDINE 20 MG PO TABS
20.0000 mg | ORAL_TABLET | Freq: Once | ORAL | Status: AC
  Administered 2016-11-20: 20 mg via ORAL

## 2016-11-20 MED ORDER — SODIUM CHLORIDE 0.9 % IV SOLN
INTRAVENOUS | Status: DC
  Administered 2016-11-20: 14:00:00 via INTRAVENOUS

## 2016-11-20 MED ORDER — BUPIVACAINE HCL (PF) 0.5 % IJ SOLN
INTRAMUSCULAR | Status: DC | PRN
  Administered 2016-11-20: 10 mL

## 2016-11-20 SURGICAL SUPPLY — 33 items
BLADE SURG 15 STRL SS SAFETY (BLADE) ×2 IMPLANT
BULB RESERV EVAC DRAIN JP 100C (MISCELLANEOUS) IMPLANT
CANISTER SUCT 1200ML W/VALVE (MISCELLANEOUS) ×2 IMPLANT
CHLORAPREP W/TINT 26ML (MISCELLANEOUS) ×2 IMPLANT
CNTNR SPEC 2.5X3XGRAD LEK (MISCELLANEOUS)
CONT SPEC 4OZ STER OR WHT (MISCELLANEOUS)
CONTAINER SPEC 2.5X3XGRAD LEK (MISCELLANEOUS) IMPLANT
COVER PROBE FLX POLY STRL (MISCELLANEOUS) IMPLANT
DERMABOND ADVANCED (GAUZE/BANDAGES/DRESSINGS) ×1
DERMABOND ADVANCED .7 DNX12 (GAUZE/BANDAGES/DRESSINGS) ×1 IMPLANT
DEVICE LOCALIZATION ULTRAWIRE (WIRE) ×1 IMPLANT
DRAIN CHANNEL JP 15F RND 16 (MISCELLANEOUS) IMPLANT
DRAPE LAPAROTOMY TRNSV 106X77 (MISCELLANEOUS) ×2 IMPLANT
ELECT REM PT RETURN 9FT ADLT (ELECTROSURGICAL) ×2
ELECTRODE REM PT RTRN 9FT ADLT (ELECTROSURGICAL) ×1 IMPLANT
GLOVE BIO SURGEON STRL SZ7 (GLOVE) ×6 IMPLANT
GLOVE BIOGEL PI IND STRL 6.5 (GLOVE) ×2 IMPLANT
GLOVE BIOGEL PI INDICATOR 6.5 (GLOVE) ×2
GOWN STRL REUS W/ TWL LRG LVL3 (GOWN DISPOSABLE) ×3 IMPLANT
GOWN STRL REUS W/TWL LRG LVL3 (GOWN DISPOSABLE) ×3
HARMONIC SCALPEL FOCUS (MISCELLANEOUS) IMPLANT
KIT RM TURNOVER STRD PROC AR (KITS) ×2 IMPLANT
LABEL OR SOLS (LABEL) ×2 IMPLANT
MARGIN MAP 10MM (MISCELLANEOUS) ×2 IMPLANT
NEEDLE HYPO 25X1 1.5 SAFETY (NEEDLE) ×2 IMPLANT
PACK BASIN MINOR ARMC (MISCELLANEOUS) ×2 IMPLANT
SUT ETH BLK MONO 3 0 FS 1 12/B (SUTURE) ×2 IMPLANT
SUT MNCRL AB 3-0 PS2 27 (SUTURE) ×2 IMPLANT
SUT VIC AB 2-0 BRD 54 (SUTURE) ×2 IMPLANT
SUT VIC AB 2-0 CT2 27 (SUTURE) ×4 IMPLANT
SYR CONTROL 10ML (SYRINGE) ×2 IMPLANT
ULTRAWIRE LOCALIZATION DEVICE (WIRE) ×2
WATER STERILE IRR 1000ML POUR (IV SOLUTION) ×2 IMPLANT

## 2016-11-20 NOTE — Op Note (Signed)
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  Complications: None  EBL: Minimal  Drains: None  Description: Patient was placed supine on the operating table and put to sleep with an LMA, right breast was prepped and draped as sterile field. Ultrasound probe was brought up. Timeout was performed. The biopsy cavity containing the hydro-mark clip was identified at the 9:00 position the right breast about 4 cm from the nipple. The small stab incision of a Bard Ultrawire was positioned going near the clip. A circumareolar incision was then made at the 9:00 position extending upward from the wire entrance which was placed inferiorly. The wire was freed skin and subcutaneous tissue with an elevated on both sides. Using the wire as a guide a core tissue was excised out. Excised tissue was carried with the ultrasound and showed presence of the clip within. The excised tissue was tagged for margins and sent to pathology. Wound was irrigated and deeper tissue closed with 2-0 Vicryl in 2 layers. Skin was closed with subcuticular 3-0 Vicryl covered with Dermabond. Patient subsequently was returned recovery room stable condition

## 2016-11-20 NOTE — Interval H&P Note (Signed)
History and Physical Interval Note:  11/20/2016 2:12 PM  Martha [name]  has presented today for surgery, with the diagnosis of neoplasm of uncertain behavior right breast  The various methods of treatment have been discussed with the patient and family. After consideration of risks, benefits and other options for treatment, the patient has consented to  Procedure(s): BREAST LUMPECTOMY (Right) as a surgical intervention .  The patient's history has been reviewed, patient examined, no change in status, stable for surgery.  I have reviewed the patient's chart and labs.  Questions were answered to the patient's satisfaction.     Jadie Allington G

## 2016-11-20 NOTE — Anesthesia Postprocedure Evaluation (Signed)
Anesthesia Post Note  Patient: Martha [name]  Procedure(s) Performed: Procedure(s) (LRB): BREAST LUMPECTOMY (Right)  Patient location during evaluation: PACU Anesthesia Type: General Level of consciousness: awake and alert and oriented Pain management: pain level controlled Vital Signs Assessment: post-procedure vital signs reviewed and stable Respiratory status: spontaneous breathing Cardiovascular status: blood pressure returned to baseline Anesthetic complications: no    Last Vitals:  Vitals:   11/20/16 1646 11/20/16 1700  BP: (!) 148/72 (!) 148/65  Pulse: 62 66  Resp: 14   Temp: (!) 35.7 C     Last Pain:  Vitals:   11/20/16 1646  TempSrc: Temporal  PainSc:                  Gabe Glace

## 2016-11-20 NOTE — Progress Notes (Signed)
Pt having pvc"s every 3rd screen  No chest pain  Blood pressure good and heart rate good

## 2016-11-20 NOTE — Anesthesia Preprocedure Evaluation (Signed)
Anesthesia Evaluation  Patient identified by MRN, date of birth, ID band Patient awake    Reviewed: Allergy & Precautions, NPO status , Patient's Chart, lab work & pertinent test results  History of Anesthesia Complications Negative for: history of anesthetic complications  Airway Mallampati: II       Dental   Pulmonary neg pulmonary ROS, former smoker,           Cardiovascular hypertension, Pt. on medications      Neuro/Psych negative neurological ROS     GI/Hepatic Neg liver ROS, GERD  ,  Endo/Other  diabetes, Type 2, Oral Hypoglycemic Agents  Renal/GU negative Renal ROS     Musculoskeletal   Abdominal   Peds  Hematology negative hematology ROS (+)   Anesthesia Other Findings   Reproductive/Obstetrics                            Anesthesia Physical Anesthesia Plan  ASA: II  Anesthesia Plan: General   Post-op Pain Management:    Induction: Intravenous  Airway Management Planned: LMA  Additional Equipment:   Intra-op Plan:   Post-operative Plan:   Informed Consent: I have reviewed the patients History and Physical, chart, labs and discussed the procedure including the risks, benefits and alternatives for the proposed anesthesia with the patient or authorized representative who has indicated his/her understanding and acceptance.     Plan Discussed with:   Anesthesia Plan Comments:         Anesthesia Quick Evaluation

## 2016-11-20 NOTE — H&P (View-Only) (Signed)
Patient ID: Martha [name], female   DOB: [birth year], 65 y.o.   MRN: [id number]  Chief Complaint  Patient presents with  . Breast Problem    HPI Martha [name] is a 65 y.o. female who presents for a breast evaluation. The most recent mammogram was done on 10-03-16. Additional views and biopsy was 11-02-16 showing atypical papillary lesion right breast. Denies any breast injury or trauma.  Patient does perform regular self breast checks and gets regular mammograms done.   Patient states last colonoscopy was done in [place] in 2008, showing a colon polyp. I have reviewed the history of present illness with the patient.    HPI  Past Medical History:  Diagnosis Date  . Colon polyp 2008  . Diabetes mellitus without complication ([place])   . Status post dilation of esophageal narrowing 2008    Past Surgical History:  Procedure Laterality Date  . BREAST BIOPSY Right 11/02/2016   ATYPICAL PAPILLARY LESION,  . COLONOSCOPY  2008   in [place]  . DILATION AND CURETTAGE OF UTERUS      Family History  Problem Relation Age of Onset  . Hypertension Mother   . Heart disease Father 36  . Hypertension Daughter   . Cancer Maternal Aunt 14    breast and ovary    Social History Social History  Substance Use Topics  . Smoking status: Former Smoker    Quit date: 07/24/1990  . Smokeless tobacco: Never Used  . Alcohol use Yes     Comment: Socially    No Known Allergies  Current Outpatient Prescriptions  Medication Sig Dispense Refill  . aspirin 81 MG tablet Take 81 mg by mouth daily.    . Cholecalciferol (VITAMIN D-3 PO) Take 2,000 Int'l Units by mouth daily.    . fluticasone (FLONASE) 50 MCG/ACT nasal spray Place 2 sprays into both nostrils daily. (Patient taking differently: Place 2 sprays into both nostrils as needed. ) 16 [name] 2  . glucose blood test strip Use as instructed 100 each 12  . Lancets (ONETOUCH ULTRASOFT) lancets Use as instructed 100 each 12  .  lisinopril-hydrochlorothiazide (PRINZIDE,ZESTORETIC) 20-12.5 MG tablet Take 1 tablet by mouth daily. 90 tablet 3  . metFORMIN (GLUCOPHAGE) 500 MG tablet Take 1 tablet (500 mg total) by mouth 2 (two) times daily with a meal. 180 tablet 3  . naproxen (NAPROSYN) 500 MG tablet Take 500 mg by mouth as needed.     . simvastatin (ZOCOR) 40 MG tablet Take 1 tablet (40 mg total) by mouth daily. 90 tablet 3  . TRIAMCINOLONE ACETONIDE, TOP, 0.05 % OINT Apply topically as needed.      No current facility-administered medications for this visit.     Review of Systems Review of Systems  Constitutional: Negative.   Respiratory: Negative.   Cardiovascular: Negative.     Blood pressure 128/68, pulse 64, resp. rate 12, height 5\' 3"  (1.6 m), weight 205 lb (93 kg).  Physical Exam Physical Exam  Constitutional: She is oriented to person, place, and time. She appears well-developed and well-nourished.  HENT:  Mouth/Throat: Oropharynx is clear and moist.  Eyes: Conjunctivae are normal. No scleral icterus.  Neck: Neck supple.  Cardiovascular: Normal rate, regular rhythm and normal heart sounds.   Pulmonary/Chest: Effort normal and breath sounds normal. Right breast exhibits no inverted nipple, no mass, no nipple discharge, no skin change and no tenderness. Left breast exhibits no inverted nipple, no mass, no nipple discharge, no skin change and no  tenderness.  Abdominal: Soft. Normal appearance. She exhibits no mass. There is no tenderness.  Lymphadenopathy:    She has no cervical adenopathy.    She has no axillary adenopathy.  Neurological: She is alert and oriented to person, place, and time.  Skin: Skin is warm and dry.  Psychiatric: She has a normal mood and affect. Her behavior is normal.    Data Reviewed Mammogram and pathology reviewed.  Assessment    Atypical papillary lesion right breast Can not rule out DCIS      Plan    Right breast lumpectomy. Procedure, reason, risks and benefits  explained and she is agreeable Further treatment will be based on final path report.    The patient is scheduled for surgery at [facility] on 11/20/16. She will pre admit by phone. The patient is aware of date and instructions.   This information has been scribed by Karie Fetch RN, BSN,BC.  Martha [name] 11/07/2016, 9:34 AM

## 2016-11-20 NOTE — Anesthesia Procedure Notes (Signed)
Procedure Name: LMA Insertion Date/Time: 11/20/2016 2:36 PM Performed by: Johnna Acosta Pre-anesthesia Checklist: Patient identified, Emergency Drugs available, Suction available, Patient being monitored and Timeout performed Patient Re-evaluated:Patient Re-evaluated prior to inductionOxygen Delivery Method: Circle system utilized Preoxygenation: Pre-oxygenation with 100% oxygen Intubation Type: IV induction LMA: LMA inserted LMA Size: 3.5 Tube type: Oral Number of attempts: 1 Placement Confirmation: positive ETCO2 and breath sounds checked- equal and bilateral Tube secured with: Tape Dental Injury: Teeth and Oropharynx as per pre-operative assessment

## 2016-11-20 NOTE — Transfer of Care (Signed)
Immediate Anesthesia Transfer of Care Note  Patient: Martha [name]  Procedure(s) Performed: Procedure(s): BREAST LUMPECTOMY (Right)  Patient Location: PACU  Anesthesia Type:General  Level of Consciousness: awake  Airway & Oxygen Therapy: Spontaneous respirations   Post-op Assessment: Report given to RN and Post -op Vital signs reviewed and stable  Post vital signs: Reviewed and stable  Last Vitals:  Vitals:   11/20/16 1340  BP: (!) 127/108  Pulse: 69  Resp: 16  Temp: 36.7 C    Last Pain:  Vitals:   11/20/16 1340  TempSrc: Oral  PainSc: 2          Complications: No apparent anesthesia complications

## 2016-11-21 ENCOUNTER — Encounter: Payer: Self-pay | Admitting: General Surgery

## 2016-11-23 ENCOUNTER — Encounter: Payer: Self-pay | Admitting: Family Medicine

## 2016-11-23 LAB — SURGICAL PATHOLOGY

## 2016-11-24 ENCOUNTER — Telehealth: Payer: Self-pay | Admitting: *Deleted

## 2016-11-24 NOTE — Telephone Encounter (Signed)
Patient called in this morning and wants to know her results for biopsy done on 11/20/2016

## 2016-11-30 ENCOUNTER — Ambulatory Visit (INDEPENDENT_AMBULATORY_CARE_PROVIDER_SITE_OTHER): Payer: Medicare Other | Admitting: General Surgery

## 2016-11-30 VITALS — BP 136/66 | HR 80 | Resp 14 | Ht 63.0 in | Wt 202.0 lb

## 2016-11-30 DIAGNOSIS — D379 Neoplasm of uncertain behavior of digestive organ, unspecified: Secondary | ICD-10-CM

## 2016-11-30 NOTE — Patient Instructions (Signed)
Return in six weeks

## 2016-11-30 NOTE — Progress Notes (Signed)
Patient ID: Martha [name], female   DOB: 1951-03-26, 65 y.o.   MRN: [id number]  Chief Complaint  Patient presents with  . Routine Post Op    HPI Martha [name] is a 65 y.o. female.  Here today for postoperative visit, right lumpectomy 11-20-16. She states she is doing well. I have reviewed the history of present illness with the patient.  HPI  Past Medical History:  Diagnosis Date  . Arthritis   . Colon polyp 2008  . Diabetes mellitus without complication ([place])   . GERD (gastroesophageal reflux disease)    OCC-NO MEDS  . Headache    H/O MIGRAINES  . Hypertension   . Status post dilation of esophageal narrowing 2008    Past Surgical History:  Procedure Laterality Date  . BREAST BIOPSY Right 11/02/2016   ATYPICAL PAPILLARY LESION,  . BREAST LUMPECTOMY Right 11/20/2016   Procedure: BREAST LUMPECTOMY;  Surgeon: [name], MD;  Location: [facility] ORS;  Service: General;  Laterality: Right;  . COLONOSCOPY  2008   in [place]  . DILATION AND CURETTAGE OF UTERUS      Family History  Problem Relation Age of Onset  . Hypertension Mother   . Heart disease Father 31  . Hypertension Daughter   . Cancer Maternal Aunt 9    breast and ovary    Social History Social History  Substance Use Topics  . Smoking status: Former Smoker    Packs/day: 0.25    Years: 5.00    Types: Cigarettes    Quit date: 07/24/1990  . Smokeless tobacco: Never Used  . Alcohol use Yes     Comment: Socially    No Known Allergies  Current Outpatient Prescriptions  Medication Sig Dispense Refill  . aspirin 81 MG tablet Take 81 mg by mouth daily.    . Biotin 1000 MCG tablet Take 1,000 mcg by mouth daily.    . Cholecalciferol (VITAMIN D-3 PO) Take 2,000 Int'l Units by mouth daily.    . fluticasone (FLONASE) 50 MCG/ACT nasal spray Place 2 sprays into both nostrils daily. (Patient taking differently: Place 2 sprays into both nostrils daily as needed. ) 16 g 2  . glucose blood test strip Use  as instructed 100 each 12  . Lancets (ONETOUCH ULTRASOFT) lancets Use as instructed 100 each 12  . lisinopril-hydrochlorothiazide (PRINZIDE,ZESTORETIC) 20-12.5 MG tablet Take 1 tablet by mouth daily. 90 tablet 3  . metFORMIN (GLUCOPHAGE) 500 MG tablet Take 1 tablet (500 mg total) by mouth 2 (two) times daily with a meal. (Patient taking differently: Take 500 mg by mouth daily with breakfast. ) 180 tablet 3  . naproxen sodium (ANAPROX) 220 MG tablet Take 220 mg by mouth 2 (two) times daily as needed.    . simvastatin (ZOCOR) 40 MG tablet Take 1 tablet (40 mg total) by mouth daily. (Patient taking differently: Take 40 mg by mouth daily with lunch. ) 90 tablet 3  . TRIAMCINOLONE ACETONIDE, TOP, 0.05 % OINT Apply 1 application topically as needed.      No current facility-administered medications for this visit.     Review of Systems Review of Systems  Constitutional: Negative.   Respiratory: Negative.   Cardiovascular: Negative.     Blood pressure 136/66, pulse 80, resp. rate 14, height 5\' 3"  (1.6 m), weight 202 lb (91.6 kg).  Physical Exam Physical Exam  Constitutional: She is oriented to person, place, and time. She appears well-developed and well-nourished.  Cardiovascular: Regular rhythm.   Pulmonary/Chest:  Right breast exhibits no inverted nipple, no mass, no nipple discharge, no skin change and no tenderness.  Right breast incision is clean and no sign of infection.   Neurological: She is alert and oriented to person, place, and time.  Skin: Skin is warm and dry.  Psychiatric: Her behavior is normal.    Data Reviewed Progress notes Path report: papilloma and ADH Assessment    Stable postop exam. Path showed atypical ductal hyperplasia   Plan       Discussed preventive treatment using antihormonal therapy with patient. She will think about it and decide on her next visit in few weeks.  This information has been scribed by Karie Fetch RN, BSN,BC.   SANKAR,SEEPLAPUTHUR  G 12/01/2016, 10:37 AM

## 2016-12-13 ENCOUNTER — Other Ambulatory Visit: Payer: Self-pay | Admitting: Family Medicine

## 2016-12-13 DIAGNOSIS — I10 Essential (primary) hypertension: Secondary | ICD-10-CM

## 2016-12-13 MED ORDER — LISINOPRIL-HYDROCHLOROTHIAZIDE 20-12.5 MG PO TABS: 1.0000 | ORAL_TABLET | Freq: Every day | ORAL | 3 refills | Status: DC

## 2016-12-20 ENCOUNTER — Ambulatory Visit (INDEPENDENT_AMBULATORY_CARE_PROVIDER_SITE_OTHER): Payer: Medicare Other | Admitting: Family Medicine

## 2016-12-20 ENCOUNTER — Encounter: Payer: Self-pay | Admitting: Family Medicine

## 2016-12-20 VITALS — BP 136/66 | HR 87 | Temp 98.7°F | Wt 204.2 lb

## 2016-12-20 DIAGNOSIS — J029 Acute pharyngitis, unspecified: Secondary | ICD-10-CM | POA: Diagnosis not present

## 2016-12-20 LAB — POCT RAPID STREP A (OFFICE): Rapid Strep A Screen: POSITIVE — AB

## 2016-12-20 MED ORDER — AMOXICILLIN 500 MG PO CAPS: 500.0000 mg | ORAL_CAPSULE | Freq: Two times a day (BID) | ORAL | 0 refills | Status: DC

## 2016-12-20 NOTE — Progress Notes (Signed)
Pre visit review using our clinic review tool, if applicable. No additional management support is needed unless otherwise documented below in the visit note. 

## 2016-12-20 NOTE — Patient Instructions (Signed)
Nice to see you. Your strep test was positive. We will treat with amoxicillin for this. Please try to drink plenty of fluids. If you develop any fevers, worsening symptoms, or new symptoms please seek medical attention.

## 2016-12-20 NOTE — Progress Notes (Signed)
  Tommi Rumps, MD Phone: [phone]  Martha [name] is a 65 y.o. female who presents today for same-day visit.  Patient notes 1 day of sore throat and cough and congestion. Notes her throat is scratchy. Some mild nasal congestion. No sinus congestion. No productive cough. No fevers. Blowing clear mucus out of her nose. Ears feel full. She has a history of strep throat and was diagnosed with strep throat when she had similar symptoms previously. Has used Tylenol and aspirin with it.  ROS see history of present illness  Objective  Physical Exam Vitals:   12/20/16 1443  BP: 136/66  Pulse: 87  Temp: 98.7 F (37.1 C)    BP Readings from Last 3 Encounters:  12/20/16 136/66  11/30/16 136/66  11/20/16 (!) 148/65   Wt Readings from Last 3 Encounters:  12/20/16 204 lb 3.2 oz (92.6 kg)  11/30/16 202 lb (91.6 kg)  11/20/16 205 lb (93 kg)    Physical Exam  Constitutional: No distress.  HENT:  Head: Normocephalic and atraumatic.  Mild posterior oropharyngeal erythema with postnasal drip noted, small amount of white exudate noted bilaterally in the posterior oropharynx  Eyes: Conjunctivae are normal. Pupils are equal, round, and reactive to light.  Neck: Neck supple.  Cardiovascular: Normal rate, regular rhythm and normal heart sounds.   Pulmonary/Chest: Effort normal and breath sounds normal.  Lymphadenopathy:    She has no cervical adenopathy.  Skin: She is not diaphoretic.     Assessment/Plan: Please see individual problem list.  Sore throat Patient with positive rapid strep test. Sore throat symptoms and exudates could be consistent with strep throat. Other symptoms could be consistent with viral upper respiratory infection. Given positive rapid strep test we will treat with amoxicillin. Discussed other supportive care. She's given return precautions.   Orders Placed This Encounter  Procedures  . POCT rapid strep A    Meds ordered this encounter  Medications  .  amoxicillin (AMOXIL) 500 MG capsule    Sig: Take 1 capsule (500 mg total) by mouth 2 (two) times daily.    Dispense:  20 capsule    Refill:  0   Tommi Rumps, MD [place]

## 2016-12-20 NOTE — Assessment & Plan Note (Signed)
Patient with positive rapid strep test. Sore throat symptoms and exudates could be consistent with strep throat. Other symptoms could be consistent with viral upper respiratory infection. Given positive rapid strep test we will treat with amoxicillin. Discussed other supportive care. She's given return precautions.

## 2016-12-21 ENCOUNTER — Other Ambulatory Visit: Payer: Self-pay | Admitting: Family Medicine

## 2016-12-21 ENCOUNTER — Encounter: Payer: Self-pay | Admitting: Family Medicine

## 2016-12-21 MED ORDER — BENZONATATE 200 MG PO CAPS: 200.0000 mg | ORAL_CAPSULE | Freq: Two times a day (BID) | ORAL | 0 refills | Status: DC | PRN

## 2016-12-28 ENCOUNTER — Other Ambulatory Visit: Payer: Self-pay | Admitting: Family Medicine

## 2017-01-01 ENCOUNTER — Encounter: Payer: Self-pay | Admitting: Family Medicine

## 2017-01-01 ENCOUNTER — Other Ambulatory Visit: Payer: Self-pay | Admitting: Family Medicine

## 2017-01-01 DIAGNOSIS — N6099 Unspecified benign mammary dysplasia of unspecified breast: Secondary | ICD-10-CM

## 2017-01-15 ENCOUNTER — Ambulatory Visit (HOSPITAL_BASED_OUTPATIENT_CLINIC_OR_DEPARTMENT_OTHER): Payer: Medicare Other | Admitting: Hematology and Oncology

## 2017-01-15 ENCOUNTER — Encounter: Payer: Self-pay | Admitting: Hematology and Oncology

## 2017-01-15 DIAGNOSIS — N6091 Unspecified benign mammary dysplasia of right breast: Secondary | ICD-10-CM | POA: Diagnosis not present

## 2017-01-15 DIAGNOSIS — Z87891 Personal history of nicotine dependence: Secondary | ICD-10-CM | POA: Diagnosis not present

## 2017-01-15 DIAGNOSIS — Z8041 Family history of malignant neoplasm of ovary: Secondary | ICD-10-CM

## 2017-01-15 DIAGNOSIS — Z803 Family history of malignant neoplasm of breast: Secondary | ICD-10-CM | POA: Diagnosis not present

## 2017-01-15 NOTE — Assessment & Plan Note (Signed)
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 I explained to her that atypical ductal hyperplasia is characterized by a proliferation of uniform epithelial cells filling part, but not the entirety, of the involved duct. ADH is associated with an increased risk of both ipsilateral and contralateral breast cancer and thus provides evidence of underlying breast abnormalities that predispose to breast cancer.   Prognosis:Using the American [employer] Society breast cancer risk assessment tool, her risk of breast cancer in 5 years is at 1.1% ( average woman's risk is 0.6%), her lifetime risk of breast cancers at 15.4% ( average risk is a 10%)  I discussed the risks and benefits of tamoxifen therapy.  I also recommended the following 1. Exercise 30 minutes daily 2. Weight loss 3. Increasing fruits and vegetables and less red meat  Surveillance plan: Annual mammograms and breast exams

## 2017-01-15 NOTE — Progress Notes (Signed)
[facility] CONSULT NOTE  Patient Care Team: Coral Spikes, DO as PCP - General (Family Medicine) Coral Spikes, DO as Consulting Physician (Family Medicine) Seeplaputhur Robinette Haines, MD (General Surgery)  CHIEF COMPLAINTS/PURPOSE OF CONSULTATION:  Newly diagnosed right breast atypical ductal hyperplasia  HISTORY OF PRESENTING ILLNESS:  Martha [name] 66 y.o. female is here because of recent diagnosis of right breast atypical ductal hyperplasia. Patient had a routine screening mammogram that detected an abnormality in the right breast. Initial biopsy revealed intraductal papilloma. She underwent needle localized excision on 11/23/2016 which revealed intraductal papilloma with focal atypical ductal hyperplasia. The surgical margins were negative. This was done at [facility] by [facility]. She wanted to find out if she needs to take tamoxifen and hence she made this appointment to see me today.  I reviewed her records extensively and collaborated the history with the patient.  MEDICAL HISTORY:  Past Medical History:  Diagnosis Date  . Arthritis   . Colon polyp 2008  . Diabetes mellitus without complication ([place])   . GERD (gastroesophageal reflux disease)    OCC-NO MEDS  . Headache    H/O MIGRAINES  . Hypertension   . Status post dilation of esophageal narrowing 2008    SURGICAL HISTORY: Past Surgical History:  Procedure Laterality Date  . BREAST BIOPSY Right 11/02/2016   ATYPICAL PAPILLARY LESION,  . BREAST LUMPECTOMY Right 11/20/2016   Procedure: BREAST LUMPECTOMY;  Surgeon: [name], MD;  Location: [facility] ORS;  Service: General;  Laterality: Right;  . COLONOSCOPY  2008   in [place]  . DILATION AND CURETTAGE OF UTERUS      SOCIAL HISTORY: Social History   Social History  . Marital status: Married    Spouse name: N/A  . Number of children: N/A  . Years of education: N/A   Occupational History  . Not on file.   Social History Main Topics   . Smoking status: Former Smoker    Packs/day: 0.25    Years: 5.00    Types: Cigarettes    Quit date: 07/24/1990  . Smokeless tobacco: Never Used  . Alcohol use Yes     Comment: Socially  . Drug use: No  . Sexual activity: Not Currently   Other Topics Concern  . Not on file   Social History Narrative   Lives in [place]. From [place]. Son lives with pt. Dog in home.      Work - [employer], and Theme park manager, now retired.      Diet - regular      Exercise - no regular    FAMILY HISTORY: Family History  Problem Relation Age of Onset  . Hypertension Mother   . Heart disease Father 18  . Hypertension Daughter   . Cancer Maternal Aunt 80    breast and ovary    ALLERGIES:  has No Known Allergies.  MEDICATIONS:  Current Outpatient Prescriptions  Medication Sig Dispense Refill  . amoxicillin (AMOXIL) 500 MG capsule Take 1 capsule (500 mg total) by mouth 2 (two) times daily. 20 capsule 0  . aspirin 81 MG tablet Take 81 mg by mouth daily.    . benzonatate (TESSALON) 200 MG capsule Take 1 capsule (200 mg total) by mouth 2 (two) times daily as needed for cough. 20 capsule 0  . Biotin 1000 MCG tablet Take 1,000 mcg by mouth daily.    . Cholecalciferol (VITAMIN D-3 PO) Take 2,000 Int'l Units by mouth daily.    . fluticasone (  FLONASE) 50 MCG/ACT nasal spray Place 2 sprays into both nostrils daily. (Patient taking differently: Place 2 sprays into both nostrils daily as needed. ) 16 g 2  . glucose blood test strip Use as instructed 100 each 12  . Lancets (ONETOUCH ULTRASOFT) lancets Use as instructed 100 each 12  . lisinopril-hydrochlorothiazide (PRINZIDE,ZESTORETIC) 20-12.5 MG tablet Take 1 tablet by mouth daily. 90 tablet 3  . metFORMIN (GLUCOPHAGE) 500 MG tablet Take 1 tablet (500 mg total) by mouth 2 (two) times daily with a meal. (Patient taking differently: Take 500 mg by mouth daily with breakfast. ) 180 tablet 3  . naproxen sodium (ANAPROX) 220 MG tablet Take 220 mg by mouth 2 (two)  times daily as needed.    . simvastatin (ZOCOR) 40 MG tablet Take 1 tablet (40 mg total) by mouth daily. (Patient taking differently: Take 40 mg by mouth daily with lunch. ) 90 tablet 3  . TRIAMCINOLONE ACETONIDE, TOP, 0.05 % OINT Apply 1 application topically as needed.      No current facility-administered medications for this visit.     REVIEW OF SYSTEMS:   Constitutional: Denies fevers, chills or abnormal night sweats Eyes: Denies blurriness of vision, double vision or watery eyes Ears, nose, mouth, throat, and face: Denies mucositis or sore throat Respiratory: Denies cough, dyspnea or wheezes Cardiovascular: Denies palpitation, chest discomfort or lower extremity swelling Gastrointestinal:  Denies nausea, heartburn or change in bowel habits Skin: Denies abnormal skin rashes Lymphatics: Denies new lymphadenopathy or easy bruising Neurological:Denies numbness, tingling or new weaknesses Behavioral/Psych: Mood is stable, no new changes  Breast: Recent right lumpectomy All other systems were reviewed with the patient and are negative.  PHYSICAL EXAMINATION: ECOG PERFORMANCE STATUS: 0 - Asymptomatic  Vitals:   01/15/17 1536  BP: 129/69  Pulse: 93  Resp: 19  Temp: 98.4 F (36.9 C)   Filed Weights   01/15/17 1536  Weight: 204 lb 1.6 oz (92.6 kg)    GENERAL:alert, no distress and comfortable SKIN: skin color, texture, turgor are normal, no rashes or significant lesions EYES: normal, conjunctiva are pink and non-injected, sclera clear OROPHARYNX:no exudate, no erythema and lips, buccal mucosa, and tongue normal  NECK: supple, thyroid normal size, non-tender, without nodularity LYMPH:  no palpable lymphadenopathy in the cervical, axillary or inguinal LUNGS: clear to auscultation and percussion with normal breathing effort HEART: regular rate & rhythm and no murmurs and no lower extremity edema ABDOMEN:abdomen soft, non-tender and normal bowel sounds Musculoskeletal:no  cyanosis of digits and no clubbing  PSYCH: alert & oriented x 3 with fluent speech NEURO: no focal motor/sensory deficits  LABORATORY DATA:  I have reviewed the data as listed Lab Results  Component Value Date   WBC 8.8 07/20/2016   HGB 12.1 07/20/2016   HCT 36.9 07/20/2016   MCV 91.7 07/20/2016   PLT 323.0 07/20/2016   Lab Results  Component Value Date   NA 138 11/13/2016   K 4.4 11/13/2016   CL 102 11/13/2016   CO2 30 11/13/2016    RADIOGRAPHIC STUDIES: I have personally reviewed the radiological reports and agreed with the findings in the report.  ASSESSMENT AND PLAN:  Atypical ductal hyperplasia of right breast Right lumpectomy: 11/23/2016: Intraductal papilloma with focal atypical ductal hyperplasia, surgical margins negative (Right breast biopsy 11/03/2016: Atypical papillary lesion cannot exclude low-grade DCIS)  Pathology review: I discussed the difference between atypical ductal hyperplasia, DCIS and invasive breast cancer. I explained to her that atypical ductal hyperplasia is characterized by a  proliferation of uniform epithelial cells filling part, but not the entirety, of the involved duct. ADH is associated with an increased risk of both ipsilateral and contralateral breast cancer and thus provides evidence of underlying breast abnormalities that predispose to breast cancer.   Prognosis:Using the American Cancer Society breast cancer risk assessment tool, her risk of breast cancer in 5 years is at 3.3% ( average woman's risk is 1.7%), her lifetime risk of breast cancers at 10.9% ( average risk is a 5.5%)  Tamoxifen counseling: I discussed the risks and benefits of tamoxifen therapy.  I discussed the adverse effects of tamoxifen including the risk of hot flashes, myalgias, risk of blood clots and uterine hypertrophy.  I also recommended the following 1. Exercise 30 minutes daily 2. Weight loss 3. Increasing fruits and vegetables and less red meat After listening  to the risks and benefits of tamoxifen, patient decided that she did not want to take tamoxifen therapy.  Surveillance plan: Annual mammograms and breast exams  All questions were answered. The patient knows to call the clinic with any problems, questions or concerns.    Rulon Eisenmenger, MD 01/15/17

## 2017-01-16 ENCOUNTER — Encounter: Payer: Self-pay | Admitting: General Surgery

## 2017-01-16 ENCOUNTER — Ambulatory Visit (INDEPENDENT_AMBULATORY_CARE_PROVIDER_SITE_OTHER): Payer: Medicare Other | Admitting: General Surgery

## 2017-01-16 VITALS — BP 124/72 | HR 80 | Resp 12 | Ht 63.0 in | Wt 203.0 lb

## 2017-01-16 DIAGNOSIS — D379 Neoplasm of uncertain behavior of digestive organ, unspecified: Secondary | ICD-10-CM

## 2017-01-16 DIAGNOSIS — N6091 Unspecified benign mammary dysplasia of right breast: Secondary | ICD-10-CM

## 2017-01-16 NOTE — Progress Notes (Signed)
Patient ID: Martha [name], female   DOB: [birth year], 66 y.o.   MRN: [id number]  Chief Complaint  Patient presents with  . Routine Post Op    HPI Martha [name] is a 66 y.o. female.  Here today for follow up right lumpectomy on 11-20-16. She states she is doing well. I have reviewed the history of present illness with the patient.  HPI  Past Medical History:  Diagnosis Date  . Arthritis   . 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 Colon polyp 2008  . Diabetes mellitus without complication ([place])   . GERD (gastroesophageal reflux disease)    OCC-NO MEDS  . Headache    H/O MIGRAINES  . Hypertension   . Papilloma 11/20/2016   right breast  . Status post dilation of esophageal narrowing 2008    Past Surgical History:  Procedure Laterality Date  . BREAST BIOPSY Right 11/02/2016   ATYPICAL PAPILLARY LESION,  . BREAST LUMPECTOMY Right 11/20/2016   Procedure: BREAST LUMPECTOMY;  Surgeon: [name], MD;  Location: [facility] ORS;  Service: General;  Laterality: Right;  . COLONOSCOPY  2008   in [place]  . DILATION AND CURETTAGE OF UTERUS      Family History  Problem Relation Age of Onset  . Hypertension Mother   . Heart disease Father 12  . Hypertension Daughter   . Cancer Maternal Aunt 73    breast and ovary    Social History Social History  Substance Use Topics  . Smoking status: Former Smoker    Packs/day: 0.25    Years: 5.00    Types: Cigarettes    Quit date: 07/24/1990  . Smokeless tobacco: Never Used  . Alcohol use Yes     Comment: Socially    No Known Allergies  Current Outpatient Prescriptions  Medication Sig Dispense Refill  . aspirin 81 MG tablet Take 81 mg by mouth daily.    . Biotin 1000 MCG tablet Take 1,000 mcg by mouth daily.    . Cholecalciferol (VITAMIN D-3 PO) Take 2,000 Int'l Units by mouth daily.    . fluticasone (FLONASE) 50 MCG/ACT nasal spray Place 2 sprays into both nostrils daily. (Patient taking differently:  Place 2 sprays into both nostrils daily as needed. ) 16 g 2  . glucose blood test strip Use as instructed 100 each 12  . Lancets (ONETOUCH ULTRASOFT) lancets Use as instructed 100 each 12  . lisinopril-hydrochlorothiazide (PRINZIDE,ZESTORETIC) 20-12.5 MG tablet Take 1 tablet by mouth daily. 90 tablet 3  . metFORMIN (GLUCOPHAGE) 500 MG tablet Take 1 tablet (500 mg total) by mouth 2 (two) times daily with a meal. (Patient taking differently: Take 500 mg by mouth daily with breakfast. ) 180 tablet 3  . naproxen sodium (ANAPROX) 220 MG tablet Take 220 mg by mouth 2 (two) times daily as needed.    . simvastatin (ZOCOR) 40 MG tablet Take 1 tablet (40 mg total) by mouth daily. (Patient taking differently: Take 40 mg by mouth daily with lunch. ) 90 tablet 3  . TRIAMCINOLONE ACETONIDE, TOP, 0.05 % OINT Apply 1 application topically as needed.      No current facility-administered medications for this visit.     Review of Systems Review of Systems  Constitutional: Negative.   Respiratory: Negative.   Cardiovascular: Negative.     Blood pressure 124/72, pulse 80, resp. rate 12, height 5\' 3"  (1.6 m), weight 203 lb (92.1 kg).  Physical Exam Physical Exam  Constitutional: She is  oriented to person, place, and time. She appears well-developed and well-nourished.  HENT:  Mouth/Throat: Oropharynx is clear and moist.  Eyes: Conjunctivae are normal. No scleral icterus.  Pulmonary/Chest: Right breast exhibits no inverted nipple, no mass, no nipple discharge, no skin change and no tenderness.  Neurological: She is alert and oriented to person, place, and time.  Skin: Skin is warm and dry.  Psychiatric: Her behavior is normal.    Data Reviewed  Prior notes  Assessment    Post-operative lumpectomy. Atypical ductal hyperplasia.    Plan    Follow up in April right breast mammogram and office visit.    This information has been scribed by Karie Fetch RN, BSN,BC.    Martha [name]  G 01/16/2017, 3:46 PM

## 2017-01-16 NOTE — Patient Instructions (Addendum)
The patient is aware to call back for any questions or concerns. Follow up in April right breast mammogram and office visit.

## 2017-01-28 ENCOUNTER — Encounter: Payer: Self-pay | Admitting: Family Medicine

## 2017-01-29 ENCOUNTER — Encounter: Payer: Self-pay | Admitting: Family Medicine

## 2017-01-29 ENCOUNTER — Ambulatory Visit (INDEPENDENT_AMBULATORY_CARE_PROVIDER_SITE_OTHER): Payer: Medicare Other | Admitting: Family Medicine

## 2017-01-29 DIAGNOSIS — M545 Low back pain, unspecified: Secondary | ICD-10-CM | POA: Insufficient documentation

## 2017-01-29 MED ORDER — CYCLOBENZAPRINE HCL 10 MG PO TABS: 10.0000 mg | ORAL_TABLET | Freq: Three times a day (TID) | ORAL | 0 refills | Status: DC | PRN

## 2017-01-29 MED ORDER — DICLOFENAC SODIUM 75 MG PO TBEC: 75.0000 mg | DELAYED_RELEASE_TABLET | Freq: Two times a day (BID) | ORAL | 0 refills | Status: DC | PRN

## 2017-01-29 NOTE — Progress Notes (Signed)
Subjective:  Patient ID: Martha [name], female    DOB: 1951/02/21  Age: 66 y.o. MRN: [id number]  CC: Back pain  HPI:  66 year old female presents with complaints of back pain.  Patient reports that she developed back pain on Saturday. She thinks it may have been caused by a twisting movement while she was putting on her pants. 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 Patient reports that her pain is in the low back. Started on the right side and is now progressed to both sides. Located predominantly in the paraspinal regions. She has taken Aleve and expired Flexeril without significant improvement. No reports of radiculopathy. No known exacerbating factors. No other associated symptoms. No other complaints this time.  Social Hx   Social History   Social History  . Marital status: Married    Spouse name: N/A  . Number of children: N/A  . Years of education: N/A   Social History Main Topics  . Smoking status: Former Smoker    Packs/day: 0.25    Years: 5.00    Types: Cigarettes    Quit date: 07/24/1990  . Smokeless tobacco: Never Used  . Alcohol use Yes     Comment: Socially  . Drug use: No  . Sexual activity: Not Currently   Other Topics Concern  . None   Social History Narrative   Lives in [place]. From [place]. Son lives with pt. Dog in home.      Work - [employer], and Theme park manager, now retired.      Diet - regular      Exercise - no regular    Review of Systems  Constitutional: Negative.   Musculoskeletal: Positive for back pain.   Objective:  BP 123/77   Pulse 72   Temp 97.5 F (36.4 C) (Oral)   Wt 207 lb (93.9 kg)   SpO2 99%   BMI 36.67 kg/m   BP/Weight 01/29/2017 01/16/2017 [id number]  Systolic BP [id number]  Diastolic BP 77 72 69  Wt. (Lbs) 207 203 204.1  BMI 36.67 35.96 36.15   Physical Exam  Constitutional: She is oriented to person, place, and time. She appears well-developed. No distress.  Cardiovascular:  Normal rate and regular rhythm.   Pulmonary/Chest: Effort normal and breath sounds normal.  Musculoskeletal:  Low back - paraspinal muscle tenderness on exam.  Neurological: She is alert and oriented to person, place, and time.  Psychiatric: She has a normal mood and affect.  Vitals reviewed.  Lab Results  Component Value Date   WBC 8.8 07/20/2016   HGB 12.1 07/20/2016   HCT 36.9 07/20/2016   PLT 323.0 07/20/2016   GLUCOSE 90 11/13/2016   CHOL 147 07/20/2016   TRIG 82.0 07/20/2016   HDL 59.40 07/20/2016   LDLCALC 72 07/20/2016   ALT 10 07/20/2016   AST 14 07/20/2016   NA 138 11/13/2016   K 4.4 11/13/2016   CL 102 11/13/2016   CREATININE 0.91 11/13/2016   BUN 18 11/13/2016   CO2 30 11/13/2016   HGBA1C 6.5 10/24/2016   MICROALBUR 2.8 (H) 02/21/2016    Assessment & Plan:   Problem List Items Addressed This Visit    Low back pain    New acute problem. Treating with Diclofenac and Flexeril.      Relevant Medications   cyclobenzaprine (FLEXERIL) 10 MG tablet   diclofenac (VOLTAREN) 75 MG EC tablet      Meds  ordered this encounter  Medications  . cyclobenzaprine (FLEXERIL) 10 MG tablet    Sig: Take 1 tablet (10 mg total) by mouth 3 (three) times daily as needed for muscle spasms.    Dispense:  30 tablet    Refill:  0  . diclofenac (VOLTAREN) 75 MG EC tablet    Sig: Take 1 tablet (75 mg total) by mouth 2 (two) times daily as needed.    Dispense:  30 tablet    Refill:  0    Follow-up: PRN  Esp[place]

## 2017-01-29 NOTE — Progress Notes (Signed)
Pre visit review using our clinic review tool, if applicable. No additional management support is needed unless otherwise documented below in the visit note. 

## 2017-01-29 NOTE — Patient Instructions (Signed)
Take the medication as prescribed.  Call if you fail to improve or worsen.  Take care  Dr. Lacinda Axon   Low Back Strain Rehab Ask your health care provider which exercises are safe for you. Do exercises exactly as told by your health care provider and adjust them as directed. It is normal to feel mild stretching, pulling, tightness, or discomfort as you do these exercises, but you should stop right away if you feel sudden pain or your pain gets worse. Do not begin these exercises until told by your health care provider. Stretching and range of motion exercises These exercises warm up your muscles and joints and improve the movement and flexibility of your back. These exercises also help to relieve pain, numbness, and tingling. Exercise A: Single knee to chest 1. Lie on your back on a firm surface with both legs straight. 2. Bend one of your knees. Use your hands to move your knee up toward your chest until you feel a gentle stretch in your lower back and buttock.  Hold your leg in this position by holding onto the front of your knee.  Keep your other leg as straight as possible. 3. Hold for __________ seconds. 4. Slowly return to the starting position. 5. Repeat with your other leg. Repeat __________ times. Complete this exercise __________ times a day. Exercise B: Prone extension on elbows 1. Lie on your abdomen on a firm surface. 2. Prop yourself up on your elbows. 3. Use your arms to help lift your chest up until you feel a gentle stretch in your abdomen and your lower back.  This will place some of your body weight on your elbows. If this is uncomfortable, try stacking pillows under your chest.  Your hips should stay down, against the surface that you are lying on. Keep your hip and back muscles relaxed. 4. Hold for __________ seconds. 5. Slowly relax your upper body and return to the starting position. Repeat __________ times. Complete this exercise __________ times a  day. Strengthening exercises These exercises build strength and endurance in your back. Endurance is the ability to use your muscles for a long time, even after they get tired. Exercise C: Pelvic tilt 1. Lie on your back on a firm surface. Bend your knees and keep your feet flat. 2. Tense your abdominal muscles. Tip your pelvis up toward the ceiling and flatten your lower back into the floor.  To help with this exercise, you may place a small towel under your lower back and try to push your back into the towel. 3. Hold for __________ seconds. 4. Let your muscles relax completely before you repeat this exercise. Repeat __________ times. Complete this exercise __________ times a day. Exercise D: Alternating arm and leg raises 1. Get on your hands and knees on a firm surface. If you are on a hard floor, you may want to use padding to cushion your knees, such as an exercise mat. 2. Line up your arms and legs. Your hands should be below your shoulders, and your knees should be below your hips. 3. Lift your left leg behind you. At the same time, raise your right arm and straighten it in front of you.  Do not lift your leg higher than your hip.  Do not lift your arm higher than your shoulder.  Keep your abdominal and back muscles tight.  Keep your hips facing the ground.  Do not arch your back.  Keep your balance carefully, and do not hold your  breath. 4. Hold for __________ seconds. 5. Slowly return to the starting position and repeat with your right leg and your left arm. Repeat __________ times. Complete this exercise __________times a day. Exercise J: Single leg lower with bent knees 1. Lie on your back on a firm surface. 2. Tense your abdominal muscles and lift your feet off the floor, one foot at a time, so your knees and hips are bent in an "L" shape (at about 90 degrees).  Your knees should be over your hips and your lower legs should be parallel to the floor. 3. Keeping your  abdominal muscles tense and your knee bent, slowly lower one of your legs so your toe touches the ground. 4. Lift your leg back up to return to the starting position.  Do not hold your breath.  Do not let your back arch. Keep your back flat against the ground. 5. Repeat with your other leg. Repeat __________ times. Complete this exercise __________ times a day. Posture and body mechanics   Body mechanics refers to the movements and positions of your body while you do your daily activities. Posture is part of body mechanics. Good posture and healthy body mechanics can help to relieve stress in your body's tissues and joints. Good posture means that your spine is in its natural S-curve position (your spine is neutral), your shoulders are pulled back slightly, and your head is not tipped forward. The following are general guidelines for applying improved posture and body mechanics to your everyday activities. Standing   When standing, keep your spine neutral and your feet about hip-width apart. Keep a slight bend in your knees. Your ears, shoulders, and hips should line up.  When you do a task in which you stand in one place for a long time, place one foot up on a stable object that is 2-4 inches (5-10 cm) high, such as a footstool. This helps keep your spine neutral. Sitting  When sitting, keep your spine neutral and keep your feet flat on the floor. Use a footrest, if necessary, and keep your thighs parallel to the floor. Avoid rounding your shoulders, and avoid tilting your head forward.  When working at a desk or a computer, keep your desk at a height where your hands are slightly lower than your elbows. Slide your chair under your desk so you are close enough to maintain good posture.  When working at a computer, place your monitor at a height where you are looking straight ahead and you do not have to tilt your head forward or downward to look at the screen. Resting   When lying down  and resting, avoid positions that are most painful for you.  If you have pain with activities such as sitting, bending, stooping, or squatting (flexion-based activities), lie in a position in which your body does not bend very much. For example, avoid curling up on your side with your arms and knees near your chest (fetal position).  If you have pain with activities such as standing for a long time or reaching with your arms (extension-based activities), lie with your spine in a neutral position and bend your knees slightly. Try the following positions:  Lying on your side with a pillow between your knees.  Lying on your back with a pillow under your knees. Lifting   When lifting objects, keep your feet at least shoulder-width apart and tighten your abdominal muscles.  Bend your knees and hips and keep your spine neutral. It  is important to lift using the strength of your legs, not your back. Do not lock your knees straight out.  Always ask for help to lift heavy or awkward objects. This information is not intended to replace advice given to you by your health care provider. Make sure you discuss any questions you have with your health care provider. Document Released: 12/11/2005 Document Revised: 08/17/2016 Document Reviewed: 09/22/2015 Elsevier Interactive Patient Education  2017 [employer].

## 2017-01-29 NOTE — Assessment & Plan Note (Signed)
New acute problem. Treating with Diclofenac and Flexeril.

## 2017-02-09 ENCOUNTER — Other Ambulatory Visit: Payer: Self-pay

## 2017-02-09 DIAGNOSIS — N6091 Unspecified benign mammary dysplasia of right breast: Secondary | ICD-10-CM

## 2017-02-09 DIAGNOSIS — D379 Neoplasm of uncertain behavior of digestive organ, unspecified: Secondary | ICD-10-CM

## 2017-02-26 ENCOUNTER — Telehealth: Payer: Self-pay | Admitting: Family Medicine

## 2017-02-26 NOTE — Telephone Encounter (Signed)
[place] with me,  I take care of her mother Robert Bellow and she asked me during gracie's visit.

## 2017-02-26 NOTE — Telephone Encounter (Signed)
OK with me.

## 2017-02-26 NOTE — Telephone Encounter (Signed)
Pt stated that she would like to switch to Dr.Tullo. Please advise if this switch is okay?

## 2017-02-27 NOTE — Telephone Encounter (Signed)
PCP has been changed per Dr. Lacinda Axon and Dr. Derrel Nip.

## 2017-02-27 NOTE — Telephone Encounter (Signed)
Ok to change PCP to me.  Patient does not need an appointment yet.

## 2017-03-16 ENCOUNTER — Encounter: Payer: Self-pay | Admitting: Family Medicine

## 2017-03-16 ENCOUNTER — Encounter: Payer: Self-pay | Admitting: Internal Medicine

## 2017-03-17 ENCOUNTER — Encounter: Payer: Self-pay | Admitting: Internal Medicine

## 2017-03-19 ENCOUNTER — Telehealth: Payer: Self-pay

## 2017-03-19 NOTE — Telephone Encounter (Signed)
-----   Message from Coral Spikes, DO sent at 03/18/2017  8:30 PM EDT ----- Please inquire about her cough. [facility] is not accepting her.

## 2017-03-19 NOTE — Telephone Encounter (Signed)
Spoke with pt and informed her that there was a miss understanding and that Dr. Derrel Nip is going to take her on as a pt. Asked the pt about the cough that she called about and she stated that she got some mucinex d and some cough medicine and she is feeling better.

## 2017-03-19 NOTE — Telephone Encounter (Signed)
A voicemail left stating for pt to call back.

## 2017-04-04 ENCOUNTER — Encounter: Payer: Self-pay | Admitting: Internal Medicine

## 2017-04-05 ENCOUNTER — Encounter: Payer: Self-pay | Admitting: Internal Medicine

## 2017-04-09 ENCOUNTER — Ambulatory Visit
Admission: RE | Admit: 2017-04-09 | Discharge: 2017-04-09 | Disposition: A | Discharge status: Home / Self Care | Payer: Medicare Other | Source: Ambulatory Visit | Attending: General Surgery | Admitting: General Surgery

## 2017-04-09 DIAGNOSIS — N6489 Other specified disorders of breast: Secondary | ICD-10-CM | POA: Insufficient documentation

## 2017-04-09 DIAGNOSIS — D379 Neoplasm of uncertain behavior of digestive organ, unspecified: Secondary | ICD-10-CM

## 2017-04-09 DIAGNOSIS — N6091 Unspecified benign mammary dysplasia of right breast: Secondary | ICD-10-CM

## 2017-04-10 ENCOUNTER — Ambulatory Visit (INDEPENDENT_AMBULATORY_CARE_PROVIDER_SITE_OTHER): Payer: Medicare Other | Admitting: Family

## 2017-04-10 ENCOUNTER — Encounter: Payer: Self-pay | Admitting: Family

## 2017-04-10 ENCOUNTER — Encounter: Payer: Self-pay | Admitting: Hematology and Oncology

## 2017-04-10 VITALS — BP 136/70 | HR 74 | Temp 97.9°F | Ht 63.0 in | Wt 203.6 lb

## 2017-04-10 DIAGNOSIS — R300 Dysuria: Secondary | ICD-10-CM

## 2017-04-10 LAB — POCT URINALYSIS DIPSTICK
Bilirubin, UA: NEGATIVE
Glucose, UA: NEGATIVE
Ketones, UA: NEGATIVE
Nitrite, UA: NEGATIVE
Spec Grav, UA: 1.015 (ref 1.010–1.025)
Urobilinogen, UA: 2 E.U./dL — AB
pH, UA: 7.5 (ref 5.0–8.0)

## 2017-04-10 NOTE — Progress Notes (Signed)
Pre visit review using our clinic review tool, if applicable. No additional management support is needed unless otherwise documented below in the visit note. 

## 2017-04-10 NOTE — Patient Instructions (Signed)
Lets await on urine culture as discussed  Plenty of water!

## 2017-04-10 NOTE — Progress Notes (Signed)
Subjective:    Patient ID: Martha [name], female    DOB: 05/12/1951, 66 y.o.   MRN: [id number]  CC: Martha [name] is a 66 y.o. female who presents today for an acute visit.    HPI: CC: dysuria x one week, unchanged. Describes suprapubic pressure. Started on azo with some relief. Endorses urinary frequency, low back ache on right side ( has low back pain from being hairdresser as well), chills. No fever., nausea, vomiting. Notes no back pain today.   No recent utis. No concern for stds    HISTORY:  Past Medical History:  Diagnosis Date  . Arthritis   . 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 Colon polyp 2008  . Diabetes mellitus without complication ([place])   . GERD (gastroesophageal reflux disease)    OCC-NO MEDS  . Headache    H/O MIGRAINES  . Hypertension   . Papilloma 11/20/2016   right breast  . Status post dilation of esophageal narrowing 2008   Past Surgical History:  Procedure Laterality Date  . BREAST BIOPSY Right 11/02/2016   ATYPICAL PAPILLARY LESION,  . BREAST EXCISIONAL BIOPSY Right 11/20/2016   AHD  . BREAST LUMPECTOMY Right 11/20/2016   Procedure: BREAST LUMPECTOMY;  Surgeon: [name], MD;  Location: [facility] ORS;  Service: General;  Laterality: Right;  . COLONOSCOPY  2008   in [place]  . DILATION AND CURETTAGE OF UTERUS     Family History  Problem Relation Age of Onset  . Hypertension Mother   . Heart disease Father 49  . Hypertension Daughter   . Cancer Maternal Aunt 80    breast and ovary    Allergies: Patient has no known allergies. Current Outpatient Prescriptions on File Prior to Visit  Medication Sig Dispense Refill  . aspirin 81 MG tablet Take 81 mg by mouth daily.    . Biotin 1000 MCG tablet Take 1,000 mcg by mouth daily.    . Cholecalciferol (VITAMIN D-3 PO) Take 2,000 Int'l Units by mouth daily.    . cyclobenzaprine (FLEXERIL) 10 MG tablet Take 1 tablet (10 mg total) by mouth 3 (three) times daily as  needed for muscle spasms. 30 tablet 0  . diclofenac (VOLTAREN) 75 MG EC tablet Take 1 tablet (75 mg total) by mouth 2 (two) times daily as needed. 30 tablet 0  . fluticasone (FLONASE) 50 MCG/ACT nasal spray Place 2 sprays into both nostrils daily. (Patient taking differently: Place 2 sprays into both nostrils daily as needed. ) 16 g 2  . lisinopril-hydrochlorothiazide (PRINZIDE,ZESTORETIC) 20-12.5 MG tablet Take 1 tablet by mouth daily. 90 tablet 3  . metFORMIN (GLUCOPHAGE) 500 MG tablet Take 1 tablet (500 mg total) by mouth 2 (two) times daily with a meal. (Patient taking differently: Take 500 mg by mouth daily with breakfast. ) 180 tablet 3  . simvastatin (ZOCOR) 40 MG tablet Take 1 tablet (40 mg total) by mouth daily. (Patient taking differently: Take 40 mg by mouth daily with lunch. ) 90 tablet 3  . TRIAMCINOLONE ACETONIDE, TOP, 0.05 % OINT Apply 1 application topically as needed.      No current facility-administered medications on file prior to visit.     Social History  Substance Use Topics  . Smoking status: Former Smoker    Packs/day: 0.25    Years: 5.00    Types: Cigarettes    Quit date: 07/24/1990  . Smokeless tobacco: Never Used  . Alcohol use Yes  Comment: Socially    Review of Systems  Constitutional: Negative for chills and fever.  Respiratory: Negative for cough.   Cardiovascular: Negative for chest pain and palpitations.  Gastrointestinal: Negative for nausea and vomiting.      Objective:    BP 136/70   Pulse 74   Temp 97.9 F (36.6 C) (Oral)   Ht 5\' 3"  (1.6 m)   Wt 203 lb 9.6 oz (92.4 kg)   SpO2 98%   BMI 36.07 kg/m    Physical Exam  Constitutional: She appears well-developed and well-nourished.  Cardiovascular: Normal rate, regular rhythm, normal heart sounds and normal pulses.   Pulmonary/Chest: Effort normal and breath sounds normal. She has no wheezes. She has no rhonchi. She has no rales.  Abdominal: There is no CVA tenderness.  Neurological:  She is alert.  Skin: Skin is warm and dry.  Psychiatric: She has a normal mood and affect. Her speech is normal and behavior is normal. Thought content normal.  Vitals reviewed.      Assessment & Plan:  1. Dysuria UA + Leukocytes. Afebrile and patient well appearing. We jointly agreed to wait on urine culture prior to treatment.  - POCT Urinalysis Dipstick - Urine Culture     I have discontinued [name]'s glucose blood and onetouch ultrasoft. I am also having her maintain her aspirin, Cholecalciferol (VITAMIN D-3 PO), TRIAMCINOLONE ACETONIDE (TOP), metFORMIN, fluticasone, simvastatin, Biotin, lisinopril-hydrochlorothiazide, cyclobenzaprine, and diclofenac.   No orders of the defined types were placed in this encounter.   Return precautions given.   Risks, benefits, and alternatives of the medications and treatment plan prescribed today were discussed, and patient expressed understanding.   Education regarding symptom management and diagnosis given to patient on AVS.  Continue to follow with TULLO, Aris Everts, MD for routine health maintenance.   Martha [name] and I agreed with plan.   Mable Paris, FNP

## 2017-04-11 ENCOUNTER — Encounter: Payer: Self-pay | Admitting: Family

## 2017-04-11 ENCOUNTER — Telehealth: Payer: Self-pay | Admitting: Emergency Medicine

## 2017-04-11 LAB — URINE CULTURE

## 2017-04-11 NOTE — Telephone Encounter (Signed)
Received voicemail from patient requesting a call back. Called patient back and received her voicemail; left a message advising her that we received her call and would notify Dr Lindi Adie of her recent studies at [facility].

## 2017-04-13 ENCOUNTER — Other Ambulatory Visit: Payer: Self-pay | Admitting: General Surgery

## 2017-04-13 ENCOUNTER — Encounter: Payer: Self-pay | Admitting: Family

## 2017-04-13 ENCOUNTER — Telehealth: Payer: Self-pay | Admitting: Emergency Medicine

## 2017-04-13 DIAGNOSIS — N63 Unspecified lump in unspecified breast: Secondary | ICD-10-CM

## 2017-04-13 DIAGNOSIS — N6489 Other specified disorders of breast: Secondary | ICD-10-CM

## 2017-04-13 NOTE — Telephone Encounter (Signed)
Spoke with Felia at the [facility]; she will contact patient for biopsy appointment.

## 2017-04-16 ENCOUNTER — Telehealth: Payer: Self-pay

## 2017-04-16 ENCOUNTER — Other Ambulatory Visit: Payer: Self-pay | Admitting: Hematology and Oncology

## 2017-04-16 ENCOUNTER — Telehealth: Payer: Self-pay | Admitting: *Deleted

## 2017-04-16 DIAGNOSIS — N6489 Other specified disorders of breast: Secondary | ICD-10-CM

## 2017-04-16 DIAGNOSIS — N63 Unspecified lump in unspecified breast: Secondary | ICD-10-CM

## 2017-04-16 NOTE — Telephone Encounter (Signed)
Message for patient to call the office.   Dr. Jamal Collin would like to speak with the patient.

## 2017-04-16 NOTE — Telephone Encounter (Signed)
Pt called to schedule appt regarding her recent US mammogram results. Pt states that she was told that she will need to be scheduled for a biopsy and will need to follow up with Dr.Gudena as soon as possible. Scheduled for first avaialble this week and confirmed time/date with pt.

## 2017-04-16 NOTE — Telephone Encounter (Signed)
Called pt LVM to let her know that we received her concerns and vm about discussing her recent breast US and biopsy results with Dr.Gudena. LVM for pt that he will call back by the end of business day today.

## 2017-04-17 ENCOUNTER — Ambulatory Visit: Payer: Medicare Other | Admitting: General Surgery

## 2017-04-17 ENCOUNTER — Encounter: Payer: Self-pay | Admitting: *Deleted

## 2017-04-17 ENCOUNTER — Telehealth: Payer: Self-pay | Admitting: Hematology and Oncology

## 2017-04-17 ENCOUNTER — Encounter: Payer: Self-pay | Admitting: Hematology and Oncology

## 2017-04-17 ENCOUNTER — Ambulatory Visit (HOSPITAL_BASED_OUTPATIENT_CLINIC_OR_DEPARTMENT_OTHER): Payer: Medicare Other | Admitting: Hematology and Oncology

## 2017-04-17 DIAGNOSIS — N6091 Unspecified benign mammary dysplasia of right breast: Secondary | ICD-10-CM | POA: Diagnosis not present

## 2017-04-17 DIAGNOSIS — N63 Unspecified lump in unspecified breast: Secondary | ICD-10-CM

## 2017-04-17 NOTE — Progress Notes (Signed)
Patient Care Team: [name], MD as PCP - General (Internal Medicine) Seeplaputhur Robinette Haines, MD (General Surgery)  DIAGNOSIS:  Encounter Diagnosis  Name Primary?  . Atypical ductal hyperplasia of right breast     CHIEF COMPLIANT: Follow-up of recent mammogram abnormality suggesting a nodule in the right breast  INTERVAL HISTORY: Martha [name] is a 66 year old with above-mentioned history of atypical ductal hyperplasia of the right breast where she had surgery in [place]. She underwent a recent mammogram that revealed a nodule in the right breast 9:00 position 1 cm from the nipple. She has been set up for a biopsy this Friday. She is here today to discuss the mammogram report and to discuss further steps. She is anxious about these results.  REVIEW OF SYSTEMS:   Constitutional: Denies fevers, chills or abnormal weight loss Eyes: Denies blurriness of vision Ears, nose, mouth, throat, and face: Denies mucositis or sore throat Respiratory: Denies cough, dyspnea or wheezes Cardiovascular: Denies palpitation, chest discomfort Gastrointestinal:  Denies nausea, heartburn or change in bowel habits Skin: Denies abnormal skin rashes Lymphatics: Denies new lymphadenopathy or easy bruising Neurological:Denies numbness, tingling or new weaknesses Behavioral/Psych: Mood is stable, no new changes  Extremities: No lower extremity edema Breast:  denies any pain or lumps or nodules in either breasts All other systems were reviewed with the patient and are negative.  I have reviewed the past medical history, past surgical history, social history and family history with the patient and they are unchanged from previous note.  ALLERGIES:  has No Known Allergies.  MEDICATIONS:  Current Outpatient Prescriptions  Medication Sig Dispense Refill  . aspirin 81 MG tablet Take 81 mg by mouth daily.    . Biotin 1000 MCG tablet Take 1,000 mcg by mouth daily.    . Cholecalciferol (VITAMIN D-3 PO)  Take 2,000 Int'l Units by mouth daily.    . cyclobenzaprine (FLEXERIL) 10 MG tablet Take 1 tablet (10 mg total) by mouth 3 (three) times daily as needed for muscle spasms. 30 tablet 0  . diclofenac (VOLTAREN) 75 MG EC tablet Take 1 tablet (75 mg total) by mouth 2 (two) times daily as needed. 30 tablet 0  . fluticasone (FLONASE) 50 MCG/ACT nasal spray Place 2 sprays into both nostrils daily. (Patient taking differently: Place 2 sprays into both nostrils daily as needed. ) 16 g 2  . lisinopril-hydrochlorothiazide (PRINZIDE,ZESTORETIC) 20-12.5 MG tablet Take 1 tablet by mouth daily. 90 tablet 3  . metFORMIN (GLUCOPHAGE) 500 MG tablet Take 1 tablet (500 mg total) by mouth 2 (two) times daily with a meal. (Patient taking differently: Take 500 mg by mouth daily with breakfast. ) 180 tablet 3  . simvastatin (ZOCOR) 40 MG tablet Take 1 tablet (40 mg total) by mouth daily. (Patient taking differently: Take 40 mg by mouth daily with lunch. ) 90 tablet 3  . TRIAMCINOLONE ACETONIDE, TOP, 0.05 % OINT Apply 1 application topically as needed.      No current facility-administered medications for this visit.     PHYSICAL EXAMINATION: ECOG PERFORMANCE STATUS: 1 - Symptomatic but completely ambulatory  Vitals:   04/17/17 1122  BP: (!) 139/47  Pulse: 78  Resp: 18  Temp: 97.9 F (36.6 C)   Filed Weights   04/17/17 1122  Weight: 202 lb (91.6 kg)    GENERAL:alert, no distress and comfortable SKIN: skin color, texture, turgor are normal, no rashes or significant lesions EYES: normal, Conjunctiva are pink and non-injected, sclera clear OROPHARYNX:no exudate, no erythema  and lips, buccal mucosa, and tongue normal  NECK: supple, thyroid normal size, non-tender, without nodularity LYMPH:  no palpable lymphadenopathy in the cervical, axillary or inguinal LUNGS: clear to auscultation and percussion with normal breathing effort HEART: regular rate & rhythm and no murmurs and no lower extremity  edema ABDOMEN:abdomen soft, non-tender and normal bowel sounds MUSCULOSKELETAL:no cyanosis of digits and no clubbing  NEURO: alert & oriented x 3 with fluent speech, no focal motor/sensory deficits EXTREMITIES: No lower extremity edema  LABORATORY DATA:  I have reviewed the data as listed   Chemistry      Component Value Date/Time   NA 138 11/13/2016 1115   K 4.4 11/13/2016 1115   CL 102 11/13/2016 1115   CO2 30 11/13/2016 1115   BUN 18 11/13/2016 1115   CREATININE 0.91 11/13/2016 1115      Component Value Date/Time   CALCIUM 9.7 11/13/2016 1115   ALKPHOS 107 07/20/2016 1047   AST 14 07/20/2016 1047   ALT 10 07/20/2016 1047   BILITOT 0.4 07/20/2016 1047       Lab Results  Component Value Date   WBC 8.8 07/20/2016   HGB 12.1 07/20/2016   HCT 36.9 07/20/2016   MCV 91.7 07/20/2016   PLT 323.0 07/20/2016   NEUTROABS 6.0 07/20/2016    ASSESSMENT & PLAN:  Atypical ductal hyperplasia of right breast Right lumpectomy: 11/23/2016: 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 --------------------------------------------------------------------------------------------------------------------------------------------------------------------- March 2018 mammograms revealed a small nodule in the right breast 9:00 position 1 cm from the nipple She is scheduled to undergo stereotactic biopsy on 04/20/2017. I will see her back on 04/26/2017 to discuss the pathology report and determine the next steps in the treatment. If she requires surgery, she has asked  me to set her up with Dr. Donne Hazel.   I spent 25 minutes talking to the patient of which more than half was spent in counseling and coordination of care.  No orders of the defined types were placed in this encounter.  The patient has a good understanding of the overall plan. she agrees with it. she will call with any problems that may develop before the next visit here.   Rulon Eisenmenger, MD 04/17/17

## 2017-04-17 NOTE — Progress Notes (Signed)
Met with pt after appt with Dr. Lindi Adie. Gave navigation resources and contact information.

## 2017-04-17 NOTE — Assessment & Plan Note (Signed)
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

## 2017-04-17 NOTE — Telephone Encounter (Signed)
sch follow up appt 5/3 at 10 am. VG out of office on 5/4. Ok to move to 5/3 per MD

## 2017-04-18 NOTE — Telephone Encounter (Signed)
Another message left for patient to call the office.

## 2017-04-19 ENCOUNTER — Encounter: Payer: Self-pay | Admitting: *Deleted

## 2017-04-19 ENCOUNTER — Other Ambulatory Visit: Payer: Self-pay | Admitting: Hematology and Oncology

## 2017-04-19 DIAGNOSIS — N6489 Other specified disorders of breast: Secondary | ICD-10-CM

## 2017-04-19 DIAGNOSIS — N63 Unspecified lump in unspecified breast: Secondary | ICD-10-CM

## 2017-04-19 NOTE — Progress Notes (Signed)
Patient still has not returned a call to our office.   Felia at the [facility] is aware. Felia had contacted Dr. Geralyn Flash office since patient evidently called their office requesting biopsy to be done.   Dr. Lindi Adie signed off on the order for biopsy on 04-20-17.   Yetta Flock will copy Dr. Jamal Collin so he can see the results from biopsy as well.

## 2017-04-20 ENCOUNTER — Ambulatory Visit
Admission: RE | Admit: 2017-04-20 | Discharge: 2017-04-20 | Disposition: A | Discharge status: Home / Self Care | Payer: Medicare Other | Source: Ambulatory Visit | Attending: Hematology and Oncology | Admitting: Hematology and Oncology

## 2017-04-20 DIAGNOSIS — N6489 Other specified disorders of breast: Secondary | ICD-10-CM

## 2017-04-20 DIAGNOSIS — N63 Unspecified lump in unspecified breast: Secondary | ICD-10-CM

## 2017-04-23 ENCOUNTER — Telehealth: Payer: Self-pay | Admitting: Internal Medicine

## 2017-04-23 DIAGNOSIS — E785 Hyperlipidemia, unspecified: Secondary | ICD-10-CM

## 2017-04-23 DIAGNOSIS — R5383 Other fatigue: Secondary | ICD-10-CM

## 2017-04-23 DIAGNOSIS — I10 Essential (primary) hypertension: Secondary | ICD-10-CM

## 2017-04-23 DIAGNOSIS — E119 Type 2 diabetes mellitus without complications: Secondary | ICD-10-CM

## 2017-04-23 NOTE — Telephone Encounter (Signed)
Pt called stating she received a Mychart message about needing a A1C appt. Please advise?  Call pt @ [phone]. Thank you!

## 2017-04-24 ENCOUNTER — Encounter: Payer: Self-pay | Admitting: Internal Medicine

## 2017-04-24 DIAGNOSIS — C801 Malignant (primary) neoplasm, unspecified: Secondary | ICD-10-CM

## 2017-04-24 HISTORY — PX: BREAST LUMPECTOMY: SHX2

## 2017-04-24 HISTORY — DX: Malignant (primary) neoplasm, unspecified: C80.1

## 2017-04-24 NOTE — Telephone Encounter (Signed)
Pt called back returning your call. Thank you! °

## 2017-04-24 NOTE — Telephone Encounter (Signed)
Spoke with pt and informed her that the only thing that I could think of why she would have gotten a message on mychart about needing an A1C was because it has been so long since she has had one. Scheduled the pt a follow up appt with Dr. Derrel Nip and scheduled her a lab appt. Labs have been ordered. Pt is aware of appt date and time.

## 2017-04-24 NOTE — Telephone Encounter (Signed)
LMTCB

## 2017-04-25 NOTE — Assessment & Plan Note (Signed)
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 BCS 2. XRT 3. Foll by Adj Anti estrogen therapy  RTC after surgery

## 2017-04-26 ENCOUNTER — Encounter: Payer: Self-pay | Admitting: Hematology and Oncology

## 2017-04-26 ENCOUNTER — Telehealth: Payer: Self-pay | Admitting: *Deleted

## 2017-04-26 ENCOUNTER — Other Ambulatory Visit: Payer: Self-pay | Admitting: *Deleted

## 2017-04-26 ENCOUNTER — Ambulatory Visit (HOSPITAL_BASED_OUTPATIENT_CLINIC_OR_DEPARTMENT_OTHER): Payer: Medicare Other | Admitting: Hematology and Oncology

## 2017-04-26 DIAGNOSIS — C50411 Malignant neoplasm of upper-outer quadrant of right female breast: Secondary | ICD-10-CM | POA: Insufficient documentation

## 2017-04-26 DIAGNOSIS — C50412 Malignant neoplasm of upper-outer quadrant of left female breast: Secondary | ICD-10-CM

## 2017-04-26 DIAGNOSIS — Z170 Estrogen receptor positive status [ER+]: Principal | ICD-10-CM

## 2017-04-26 DIAGNOSIS — N6091 Unspecified benign mammary dysplasia of right breast: Secondary | ICD-10-CM | POA: Diagnosis not present

## 2017-04-26 NOTE — Assessment & Plan Note (Signed)
Right breast mass 9:00 position 1 cm from nipple: 4 mm; small asymmetry in the upper outer quadrant measuring 1 cm 04/19/2017: Right breast stereotactic biopsy: IDC with DCIS, biopsy 9:00 position: ALH, ER 100%, PR 100% HER-2 pending  Pathology and radiology counseling: Discussed with the patient, the details of pathology including the type of breast cancer,the clinical staging, the significance of ER, PR receptors and the implications for treatment. HER-2 is currently pending After reviewing the pathology in detail, we proceeded to discuss the different treatment options between surgery, radiation, chemotherapy, antiestrogen therapies.  Recommendation: 1. Breast conserving surgery 2. followed by radiation 3. Followed by adjuvant antiestrogen therapy The final tumor size is larger than 1 cm, then we can consider Oncotype testing.

## 2017-04-26 NOTE — Progress Notes (Signed)
Patient Care Team: [name], MD as PCP - General (Internal Medicine) Seeplaputhur Robinette Haines, MD (General Surgery)  DIAGNOSIS:  Encounter Diagnoses  Name Primary?  . Atypical ductal hyperplasia of right breast   . Malignant neoplasm of upper-outer quadrant of right breast in female, estrogen receptor positive ([place])     SUMMARY OF ONCOLOGIC HISTORY:   Malignant neoplasm of upper-outer quadrant of right breast in female, estrogen receptor positive ([place])   04/09/2017 Mammogram    Right breast mass 9:00 position 1 cm from nipple: 4 mm; small asymmetry in the upper outer quadrant measuring 1 cm      04/20/2017 Initial Diagnosis    Right breast stereotactic biopsy: IDC with DCIS, biopsy 9:00 position: ALH, ER 100%, PR 100% HER-2 pending       CHIEF COMPLIANT: Follow-up to discuss recent biopsy findings of invasive ductal carcinoma  INTERVAL HISTORY: Martha [name] is a 66 year old with a prior history of atypical ductal hyperplasia of the right breast who presented with a mammographic abnormality that led to ultrasound and a stereotactic biopsy on 04/20/2017. 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 HER-2 is pending. She is here today to discuss the treatment plan.  REVIEW OF SYSTEMS:   Constitutional: Denies fevers, chills or abnormal weight loss Eyes: Denies blurriness of vision Ears, nose, mouth, throat, and face: Denies mucositis or sore throat Respiratory: Denies cough, dyspnea or wheezes Cardiovascular: Denies palpitation, chest discomfort Gastrointestinal:  Denies nausea, heartburn or change in bowel habits Skin: Denies abnormal skin rashes Lymphatics: Denies new lymphadenopathy or easy bruising Neurological:Denies numbness, tingling or new weaknesses Behavioral/Psych: Mood is stable, no new changes  Extremities: No lower extremity edema Breast:  denies any pain or lumps or nodules in either breasts All other systems were  reviewed with the patient and are negative.  I have reviewed the past medical history, past surgical history, social history and family history with the patient and they are unchanged from previous note.  ALLERGIES:  has No Known Allergies.  MEDICATIONS:  Current Outpatient Prescriptions  Medication Sig Dispense Refill  . aspirin 81 MG tablet Take 81 mg by mouth daily.    . Biotin 1000 MCG tablet Take 1,000 mcg by mouth daily.    . Cholecalciferol (VITAMIN D-3 PO) Take 2,000 Int'l Units by mouth daily.    . cyclobenzaprine (FLEXERIL) 10 MG tablet Take 1 tablet (10 mg total) by mouth 3 (three) times daily as needed for muscle spasms. 30 tablet 0  . diclofenac (VOLTAREN) 75 MG EC tablet Take 1 tablet (75 mg total) by mouth 2 (two) times daily as needed. 30 tablet 0  . fluticasone (FLONASE) 50 MCG/ACT nasal spray Place 2 sprays into both nostrils daily. (Patient taking differently: Place 2 sprays into both nostrils daily as needed. ) 16 g 2  . lisinopril-hydrochlorothiazide (PRINZIDE,ZESTORETIC) 20-12.5 MG tablet Take 1 tablet by mouth daily. 90 tablet 3  . metFORMIN (GLUCOPHAGE) 500 MG tablet Take 1 tablet (500 mg total) by mouth 2 (two) times daily with a meal. (Patient taking differently: Take 500 mg by mouth daily with breakfast. ) 180 tablet 3  . simvastatin (ZOCOR) 40 MG tablet Take 1 tablet (40 mg total) by mouth daily. (Patient taking differently: Take 40 mg by mouth daily with lunch. ) 90 tablet 3  . TRIAMCINOLONE ACETONIDE, TOP, 0.05 % OINT Apply 1 application topically as needed.      No current facility-administered medications for this visit.  PHYSICAL EXAMINATION: ECOG PERFORMANCE STATUS: 1 - Symptomatic but completely ambulatory  Vitals:   04/26/17 0954  BP: (!) 129/58  Pulse: 84  Resp: 18  Temp: 98.2 F (36.8 C)   Filed Weights   04/26/17 0954  Weight: 201 lb 14.4 oz (91.6 kg)    GENERAL:alert, no distress and comfortable SKIN: skin color, texture, turgor are  normal, no rashes or significant lesions EYES: normal, Conjunctiva are pink and non-injected, sclera clear OROPHARYNX:no exudate, no erythema and lips, buccal mucosa, and tongue normal  NECK: supple, thyroid normal size, non-tender, without nodularity LYMPH:  no palpable lymphadenopathy in the cervical, axillary or inguinal LUNGS: clear to auscultation and percussion with normal breathing effort HEART: regular rate & rhythm and no murmurs and no lower extremity edema ABDOMEN:abdomen soft, non-tender and normal bowel sounds MUSCULOSKELETAL:no cyanosis of digits and no clubbing  NEURO: alert & oriented x 3 with fluent speech, no focal motor/sensory deficits EXTREMITIES: No lower extremity edema BREAST: No palpable masses or nodules in either right or left breasts. No palpable axillary supraclavicular or infraclavicular adenopathy no breast tenderness or nipple discharge. (exam performed in the presence of a chaperone)  LABORATORY DATA:  I have reviewed the data as listed   Chemistry      Component Value Date/Time   NA 138 11/13/2016 1115   K 4.4 11/13/2016 1115   CL 102 11/13/2016 1115   CO2 30 11/13/2016 1115   BUN 18 11/13/2016 1115   CREATININE 0.91 11/13/2016 1115      Component Value Date/Time   CALCIUM 9.7 11/13/2016 1115   ALKPHOS 107 07/20/2016 1047   AST 14 07/20/2016 1047   ALT 10 07/20/2016 1047   BILITOT 0.4 07/20/2016 1047       Lab Results  Component Value Date   WBC 8.8 07/20/2016   HGB 12.1 07/20/2016   HCT 36.9 07/20/2016   MCV 91.7 07/20/2016   PLT 323.0 07/20/2016   NEUTROABS 6.0 07/20/2016    ASSESSMENT & PLAN:  Atypical ductal hyperplasia of right breast Right lumpectomy: 11/23/2016: 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 --------------------------------------------------------------------------------------------------------------------------------------------------------------------- Malignant neoplasm of upper-outer quadrant of right breast in female, estrogen receptor positive ([place]) Right breast mass 9:00 position 1 cm from nipple: 4 mm; small asymmetry in the upper outer quadrant measuring 1 cm 04/19/2017: Right breast stereotactic biopsy: IDC with DCIS, biopsy 9:00 position: ALH, ER 100%, PR 100% HER-2 pending  Pathology and radiology counseling: Discussed with the patient, the details of pathology including the type of breast cancer,the clinical staging, the significance of ER, PR receptors and the implications for treatment. HER-2 is currently pending After reviewing the pathology in detail, we proceeded to discuss the different treatment options between surgery, radiation, chemotherapy, antiestrogen therapies.  Recommendation: 1. Breast conserving surgery 2. followed by radiation 3. Followed by adjuvant antiestrogen therapy If the final tumor size is larger than 1 cm, then we can consider Oncotype testing.   I spent 25 minutes talking to the patient of which more than half was spent in counseling and coordination of care.  No orders of the defined types were placed in this encounter.  The patient has a good understanding of the overall plan. she agrees with it. she will call with any problems that may develop before the next visit here.   Rulon Eisenmenger, MD  04/26/17

## 2017-04-26 NOTE — Telephone Encounter (Signed)
Confirmed breast MRI for 04/28/17 at 9:45am. Pt denies questions at this time.

## 2017-04-26 NOTE — Telephone Encounter (Signed)
Schedule pt for breast MRI on 04/28/17 at 9:45am. Left pt detailed msg with MRI directions, time and date. Request pt to return call to further discuss. Contact information provdied

## 2017-04-28 ENCOUNTER — Ambulatory Visit (HOSPITAL_COMMUNITY)
Admission: RE | Admit: 2017-04-28 | Discharge: 2017-04-28 | Disposition: A | Discharge status: Home / Self Care | Payer: Medicare Other | Source: Ambulatory Visit | Attending: Hematology and Oncology | Admitting: Hematology and Oncology

## 2017-04-28 ENCOUNTER — Other Ambulatory Visit (HOSPITAL_COMMUNITY): Payer: Self-pay | Admitting: *Deleted

## 2017-04-28 DIAGNOSIS — Z170 Estrogen receptor positive status [ER+]: Secondary | ICD-10-CM | POA: Diagnosis not present

## 2017-04-28 DIAGNOSIS — C50411 Malignant neoplasm of upper-outer quadrant of right female breast: Secondary | ICD-10-CM | POA: Diagnosis not present

## 2017-04-28 DIAGNOSIS — C50412 Malignant neoplasm of upper-outer quadrant of left female breast: Secondary | ICD-10-CM

## 2017-04-28 DIAGNOSIS — C50919 Malignant neoplasm of unspecified site of unspecified female breast: Secondary | ICD-10-CM | POA: Diagnosis not present

## 2017-04-28 LAB — POCT I-STAT CREATININE: Creatinine, Ser: 1.1 mg/dL — ABNORMAL HIGH (ref 0.44–1.00)

## 2017-04-28 MED ORDER — GADOBENATE DIMEGLUMINE 529 MG/ML IV SOLN
20.0000 mL | Freq: Once | INTRAVENOUS | Status: AC | PRN
  Administered 2017-04-28: 20 mL via INTRAVENOUS

## 2017-04-30 ENCOUNTER — Ambulatory Visit
Admission: RE | Admit: 2017-04-30 | Discharge: 2017-04-30 | Disposition: A | Discharge status: Home / Self Care | Payer: Medicare Other | Source: Ambulatory Visit | Attending: General Surgery | Admitting: General Surgery

## 2017-04-30 ENCOUNTER — Other Ambulatory Visit: Payer: Self-pay | Admitting: General Surgery

## 2017-04-30 DIAGNOSIS — Z853 Personal history of malignant neoplasm of breast: Secondary | ICD-10-CM

## 2017-04-30 DIAGNOSIS — C50411 Malignant neoplasm of upper-outer quadrant of right female breast: Secondary | ICD-10-CM | POA: Diagnosis not present

## 2017-04-30 DIAGNOSIS — N63 Unspecified lump in unspecified breast: Secondary | ICD-10-CM

## 2017-05-03 ENCOUNTER — Other Ambulatory Visit: Payer: Self-pay | Admitting: General Surgery

## 2017-05-03 DIAGNOSIS — C50411 Malignant neoplasm of upper-outer quadrant of right female breast: Secondary | ICD-10-CM

## 2017-05-03 DIAGNOSIS — Z170 Estrogen receptor positive status [ER+]: Principal | ICD-10-CM

## 2017-05-11 ENCOUNTER — Other Ambulatory Visit: Payer: Self-pay | Admitting: General Surgery

## 2017-05-11 ENCOUNTER — Encounter (HOSPITAL_BASED_OUTPATIENT_CLINIC_OR_DEPARTMENT_OTHER): Payer: Self-pay | Admitting: *Deleted

## 2017-05-11 DIAGNOSIS — Z170 Estrogen receptor positive status [ER+]: Principal | ICD-10-CM

## 2017-05-11 DIAGNOSIS — C50411 Malignant neoplasm of upper-outer quadrant of right female breast: Secondary | ICD-10-CM

## 2017-05-14 ENCOUNTER — Encounter (HOSPITAL_BASED_OUTPATIENT_CLINIC_OR_DEPARTMENT_OTHER)
Admission: RE | Admit: 2017-05-14 | Discharge: 2017-05-14 | Disposition: A | Discharge status: Home / Self Care | Payer: Medicare Other | Source: Ambulatory Visit | Attending: General Surgery | Admitting: General Surgery

## 2017-05-14 DIAGNOSIS — E119 Type 2 diabetes mellitus without complications: Secondary | ICD-10-CM | POA: Diagnosis not present

## 2017-05-14 DIAGNOSIS — C50411 Malignant neoplasm of upper-outer quadrant of right female breast: Secondary | ICD-10-CM | POA: Diagnosis not present

## 2017-05-14 DIAGNOSIS — Z87891 Personal history of nicotine dependence: Secondary | ICD-10-CM | POA: Diagnosis not present

## 2017-05-14 DIAGNOSIS — K219 Gastro-esophageal reflux disease without esophagitis: Secondary | ICD-10-CM | POA: Diagnosis not present

## 2017-05-14 DIAGNOSIS — Z7984 Long term (current) use of oral hypoglycemic drugs: Secondary | ICD-10-CM | POA: Diagnosis not present

## 2017-05-14 DIAGNOSIS — Z79899 Other long term (current) drug therapy: Secondary | ICD-10-CM | POA: Diagnosis not present

## 2017-05-14 DIAGNOSIS — I10 Essential (primary) hypertension: Secondary | ICD-10-CM | POA: Diagnosis not present

## 2017-05-14 LAB — BASIC METABOLIC PANEL
Anion gap: 9 (ref 5–15)
BUN: 15 mg/dL (ref 6–20)
CO2: 28 mmol/L (ref 22–32)
Calcium: 9.6 mg/dL (ref 8.9–10.3)
Chloride: 101 mmol/L (ref 101–111)
Creatinine, Ser: 1.07 mg/dL — ABNORMAL HIGH (ref 0.44–1.00)
GFR calc Af Amer: >60 mL/min (ref >60)
GFR calc non Af Amer: 53 mL/min — ABNORMAL LOW (ref >60)
Glucose, Bld: 114 mg/dL — ABNORMAL HIGH (ref 65–99)
Potassium: 4.2 mmol/L (ref 3.5–5.1)
Sodium: 138 mmol/L (ref 135–145)

## 2017-05-14 NOTE — Progress Notes (Addendum)
Labs done, bottled water given with instructions to complete by 0615 and hibiclens soap with instructions, pt verbalized understanding.  Labs reviewed by Dr. Smith Robert no change in plan.

## 2017-05-15 ENCOUNTER — Ambulatory Visit
Admission: RE | Admit: 2017-05-15 | Discharge: 2017-05-15 | Disposition: A | Discharge status: Home / Self Care | Payer: Medicare Other | Source: Ambulatory Visit | Attending: General Surgery | Admitting: General Surgery

## 2017-05-15 DIAGNOSIS — C50411 Malignant neoplasm of upper-outer quadrant of right female breast: Secondary | ICD-10-CM

## 2017-05-15 DIAGNOSIS — N62 Hypertrophy of breast: Secondary | ICD-10-CM | POA: Diagnosis not present

## 2017-05-15 DIAGNOSIS — C50911 Malignant neoplasm of unspecified site of right female breast: Secondary | ICD-10-CM | POA: Diagnosis not present

## 2017-05-15 DIAGNOSIS — Z170 Estrogen receptor positive status [ER+]: Principal | ICD-10-CM

## 2017-05-16 ENCOUNTER — Encounter (HOSPITAL_BASED_OUTPATIENT_CLINIC_OR_DEPARTMENT_OTHER): Payer: Self-pay | Admitting: *Deleted

## 2017-05-16 ENCOUNTER — Ambulatory Visit (HOSPITAL_BASED_OUTPATIENT_CLINIC_OR_DEPARTMENT_OTHER)
Admission: RE | Admit: 2017-05-16 | Discharge: 2017-05-16 | Disposition: A | Discharge status: Home / Self Care | Payer: Medicare Other | Source: Ambulatory Visit | Attending: General Surgery | Admitting: General Surgery

## 2017-05-16 ENCOUNTER — Ambulatory Visit (HOSPITAL_COMMUNITY)
Admission: RE | Admit: 2017-05-16 | Discharge: 2017-05-16 | Disposition: A | Discharge status: Home / Self Care | Payer: Medicare Other | Source: Ambulatory Visit | Attending: General Surgery | Admitting: General Surgery

## 2017-05-16 ENCOUNTER — Ambulatory Visit
Admission: RE | Admit: 2017-05-16 | Discharge: 2017-05-16 | Disposition: A | Discharge status: Home / Self Care | Payer: Medicare Other | Source: Ambulatory Visit | Attending: General Surgery | Admitting: General Surgery

## 2017-05-16 ENCOUNTER — Encounter (HOSPITAL_BASED_OUTPATIENT_CLINIC_OR_DEPARTMENT_OTHER)
Admission: RE | Disposition: A | Discharge status: Home / Self Care | Payer: Self-pay | Source: Ambulatory Visit | Attending: General Surgery

## 2017-05-16 ENCOUNTER — Ambulatory Visit (HOSPITAL_BASED_OUTPATIENT_CLINIC_OR_DEPARTMENT_OTHER): Payer: Medicare Other | Admitting: Certified Registered"

## 2017-05-16 DIAGNOSIS — Z170 Estrogen receptor positive status [ER+]: Principal | ICD-10-CM

## 2017-05-16 DIAGNOSIS — Z87891 Personal history of nicotine dependence: Secondary | ICD-10-CM | POA: Diagnosis not present

## 2017-05-16 DIAGNOSIS — E119 Type 2 diabetes mellitus without complications: Secondary | ICD-10-CM | POA: Insufficient documentation

## 2017-05-16 DIAGNOSIS — Z79899 Other long term (current) drug therapy: Secondary | ICD-10-CM | POA: Diagnosis not present

## 2017-05-16 DIAGNOSIS — R928 Other abnormal and inconclusive findings on diagnostic imaging of breast: Secondary | ICD-10-CM | POA: Diagnosis not present

## 2017-05-16 DIAGNOSIS — Z7984 Long term (current) use of oral hypoglycemic drugs: Secondary | ICD-10-CM | POA: Diagnosis not present

## 2017-05-16 DIAGNOSIS — C50911 Malignant neoplasm of unspecified site of right female breast: Secondary | ICD-10-CM | POA: Diagnosis not present

## 2017-05-16 DIAGNOSIS — C50411 Malignant neoplasm of upper-outer quadrant of right female breast: Secondary | ICD-10-CM

## 2017-05-16 DIAGNOSIS — K219 Gastro-esophageal reflux disease without esophagitis: Secondary | ICD-10-CM | POA: Insufficient documentation

## 2017-05-16 DIAGNOSIS — E785 Hyperlipidemia, unspecified: Secondary | ICD-10-CM | POA: Diagnosis not present

## 2017-05-16 DIAGNOSIS — N62 Hypertrophy of breast: Secondary | ICD-10-CM | POA: Diagnosis not present

## 2017-05-16 DIAGNOSIS — I10 Essential (primary) hypertension: Secondary | ICD-10-CM | POA: Insufficient documentation

## 2017-05-16 DIAGNOSIS — G8918 Other acute postprocedural pain: Secondary | ICD-10-CM | POA: Diagnosis not present

## 2017-05-16 HISTORY — DX: Malignant (primary) neoplasm, unspecified: C80.1

## 2017-05-16 HISTORY — PX: RADIOACTIVE SEED GUIDED PARTIAL MASTECTOMY WITH AXILLARY SENTINEL LYMPH NODE BIOPSY: SHX6520

## 2017-05-16 LAB — GLUCOSE, CAPILLARY
Glucose-Capillary: 102 mg/dL — ABNORMAL HIGH (ref 65–99)
Glucose-Capillary: 110 mg/dL — ABNORMAL HIGH (ref 65–99)

## 2017-05-16 SURGERY — RADIOACTIVE SEED GUIDED PARTIAL MASTECTOMY WITH AXILLARY SENTINEL LYMPH NODE BIOPSY
Anesthesia: General | Laterality: Right

## 2017-05-16 MED ORDER — ACETAMINOPHEN 500 MG PO TABS
1000.0000 mg | ORAL_TABLET | ORAL | Status: AC
  Administered 2017-05-16: 1000 mg via ORAL

## 2017-05-16 MED ORDER — MEPERIDINE HCL 25 MG/ML IJ SOLN: 6.2500 mg | INTRAMUSCULAR | Status: DC | PRN

## 2017-05-16 MED ORDER — ONDANSETRON HCL 4 MG/2ML IJ SOLN
INTRAMUSCULAR | Status: DC | PRN
  Administered 2017-05-16: 4 mg via INTRAVENOUS

## 2017-05-16 MED ORDER — SCOPOLAMINE 1 MG/3DAYS TD PT72: 1.0000 | MEDICATED_PATCH | Freq: Once | TRANSDERMAL | Status: DC | PRN

## 2017-05-16 MED ORDER — FENTANYL CITRATE (PF) 100 MCG/2ML IJ SOLN
INTRAMUSCULAR | Status: AC
  Filled 2017-05-16: qty 2

## 2017-05-16 MED ORDER — LACTATED RINGERS IV SOLN
INTRAVENOUS | Status: DC
  Administered 2017-05-16 (×2): via INTRAVENOUS

## 2017-05-16 MED ORDER — SODIUM CHLORIDE 0.9 % IJ SOLN
INTRAMUSCULAR | Status: AC
  Filled 2017-05-16: qty 10

## 2017-05-16 MED ORDER — MIDAZOLAM HCL 2 MG/2ML IJ SOLN
INTRAMUSCULAR | Status: AC
Start: 2017-05-16 — End: 2017-05-16
  Filled 2017-05-16: qty 2

## 2017-05-16 MED ORDER — ACETAMINOPHEN 500 MG PO TABS
ORAL_TABLET | ORAL | Status: AC
  Filled 2017-05-16: qty 2

## 2017-05-16 MED ORDER — CEFAZOLIN SODIUM-DEXTROSE 2-4 GM/100ML-% IV SOLN
INTRAVENOUS | Status: AC
  Filled 2017-05-16: qty 100

## 2017-05-16 MED ORDER — METHYLENE BLUE 0.5 % INJ SOLN
INTRAVENOUS | Status: AC
  Filled 2017-05-16: qty 10

## 2017-05-16 MED ORDER — BUPIVACAINE HCL (PF) 0.25 % IJ SOLN
INTRAMUSCULAR | Status: AC
  Filled 2017-05-16: qty 30

## 2017-05-16 MED ORDER — HYDROCODONE-ACETAMINOPHEN 10-325 MG PO TABS: 1.0000 | ORAL_TABLET | Freq: Four times a day (QID) | ORAL | 0 refills | Status: DC | PRN

## 2017-05-16 MED ORDER — FENTANYL CITRATE (PF) 100 MCG/2ML IJ SOLN
50.0000 ug | INTRAMUSCULAR | Status: AC | PRN
  Administered 2017-05-16: 25 ug via INTRAVENOUS
  Administered 2017-05-16 (×2): 50 ug via INTRAVENOUS

## 2017-05-16 MED ORDER — CEFAZOLIN SODIUM-DEXTROSE 2-4 GM/100ML-% IV SOLN
2.0000 g | INTRAVENOUS | Status: AC
Start: 2017-05-16 — End: 2017-05-16
  Administered 2017-05-16: 2 g via INTRAVENOUS

## 2017-05-16 MED ORDER — PROPOFOL 10 MG/ML IV BOLUS
INTRAVENOUS | Status: DC | PRN
  Administered 2017-05-16: 150 mg via INTRAVENOUS

## 2017-05-16 MED ORDER — GABAPENTIN 300 MG PO CAPS
300.0000 mg | ORAL_CAPSULE | ORAL | Status: AC
  Administered 2017-05-16: 300 mg via ORAL

## 2017-05-16 MED ORDER — CHLORHEXIDINE GLUCONATE CLOTH 2 % EX PADS: 6.0000 | MEDICATED_PAD | Freq: Once | CUTANEOUS | Status: DC

## 2017-05-16 MED ORDER — FENTANYL CITRATE (PF) 100 MCG/2ML IJ SOLN: 25.0000 ug | INTRAMUSCULAR | Status: DC | PRN

## 2017-05-16 MED ORDER — OXYCODONE HCL 5 MG/5ML PO SOLN: 5.0000 mg | Freq: Once | ORAL | Status: DC | PRN

## 2017-05-16 MED ORDER — DEXAMETHASONE SODIUM PHOSPHATE 4 MG/ML IJ SOLN
INTRAMUSCULAR | Status: DC | PRN
  Administered 2017-05-16: 4 mg via INTRAVENOUS

## 2017-05-16 MED ORDER — LIDOCAINE HCL (CARDIAC) 20 MG/ML IV SOLN
INTRAVENOUS | Status: DC | PRN
  Administered 2017-05-16: 30 mg via INTRAVENOUS

## 2017-05-16 MED ORDER — EPHEDRINE SULFATE 50 MG/ML IJ SOLN
INTRAMUSCULAR | Status: DC | PRN
  Administered 2017-05-16: 10 mg via INTRAVENOUS

## 2017-05-16 MED ORDER — PROMETHAZINE HCL 25 MG/ML IJ SOLN: 6.2500 mg | INTRAMUSCULAR | Status: DC | PRN

## 2017-05-16 MED ORDER — GABAPENTIN 300 MG PO CAPS
ORAL_CAPSULE | ORAL | Status: AC
  Filled 2017-05-16: qty 1

## 2017-05-16 MED ORDER — TECHNETIUM TC 99M SULFUR COLLOID FILTERED
1.0000 | Freq: Once | INTRAVENOUS | Status: AC | PRN
  Administered 2017-05-16: 1 via INTRADERMAL

## 2017-05-16 MED ORDER — MIDAZOLAM HCL 2 MG/2ML IJ SOLN
1.0000 mg | INTRAMUSCULAR | Status: DC | PRN
  Administered 2017-05-16 (×2): 1 mg via INTRAVENOUS

## 2017-05-16 MED ORDER — OXYCODONE HCL 5 MG PO TABS: 5.0000 mg | ORAL_TABLET | Freq: Once | ORAL | Status: DC | PRN

## 2017-05-16 MED ORDER — BUPIVACAINE-EPINEPHRINE (PF) 0.5% -1:200000 IJ SOLN
INTRAMUSCULAR | Status: DC | PRN
  Administered 2017-05-16: 30 mL

## 2017-05-16 MED ORDER — BUPIVACAINE HCL (PF) 0.25 % IJ SOLN
INTRAMUSCULAR | Status: DC | PRN
  Administered 2017-05-16: 13 mL

## 2017-05-16 SURGICAL SUPPLY — 41 items
BINDER BREAST XLRG (GAUZE/BANDAGES/DRESSINGS) ×2 IMPLANT
BINDER BREAST XXLRG (GAUZE/BANDAGES/DRESSINGS) IMPLANT
BLADE SURG 15 STRL LF DISP TIS (BLADE) ×1 IMPLANT
BLADE SURG 15 STRL SS (BLADE) ×1
CHLORAPREP W/TINT 26ML (MISCELLANEOUS) ×2 IMPLANT
CLIP TI WIDE RED SMALL 6 (CLIP) ×2 IMPLANT
COVER BACK TABLE 60X90IN (DRAPES) ×2 IMPLANT
COVER MAYO STAND STRL (DRAPES) ×2 IMPLANT
COVER PROBE W GEL 5X96 (DRAPES) ×2 IMPLANT
DERMABOND ADVANCED (GAUZE/BANDAGES/DRESSINGS) ×1
DERMABOND ADVANCED .7 DNX12 (GAUZE/BANDAGES/DRESSINGS) ×1 IMPLANT
DEVICE DUBIN W/COMP PLATE 8390 (MISCELLANEOUS) ×2 IMPLANT
DRAPE LAPAROSCOPIC ABDOMINAL (DRAPES) ×2 IMPLANT
DRAPE UTILITY XL STRL (DRAPES) ×2 IMPLANT
ELECT COATED BLADE 2.86 ST (ELECTRODE) ×2 IMPLANT
ELECT REM PT RETURN 9FT ADLT (ELECTROSURGICAL) ×2
ELECTRODE REM PT RTRN 9FT ADLT (ELECTROSURGICAL) ×1 IMPLANT
GLOVE BIO SURGEON STRL SZ7 (GLOVE) ×4 IMPLANT
GLOVE BIOGEL PI IND STRL 7.5 (GLOVE) ×1 IMPLANT
GLOVE BIOGEL PI INDICATOR 7.5 (GLOVE) ×1
GOWN STRL REUS W/ TWL LRG LVL3 (GOWN DISPOSABLE) ×2 IMPLANT
GOWN STRL REUS W/TWL LRG LVL3 (GOWN DISPOSABLE) ×2
ILLUMINATOR WAVEGUIDE N/F (MISCELLANEOUS) ×2 IMPLANT
KIT MARKER MARGIN INK (KITS) ×2 IMPLANT
NEEDLE HYPO 25X1 1.5 SAFETY (NEEDLE) ×2 IMPLANT
PACK BASIN DAY SURGERY FS (CUSTOM PROCEDURE TRAY) ×2 IMPLANT
PENCIL BUTTON HOLSTER BLD 10FT (ELECTRODE) ×2 IMPLANT
SLEEVE SCD COMPRESS KNEE MED (MISCELLANEOUS) ×2 IMPLANT
SLEEVE SURGEON STRL (DRAPES) ×2 IMPLANT
SPONGE LAP 4X18 X RAY DECT (DISPOSABLE) ×2 IMPLANT
STRIP CLOSURE SKIN 1/2X4 (GAUZE/BANDAGES/DRESSINGS) ×2 IMPLANT
SUT MNCRL AB 4-0 PS2 18 (SUTURE) ×6 IMPLANT
SUT MON AB 5-0 PS2 18 (SUTURE) ×4 IMPLANT
SUT SILK 2 0 SH (SUTURE) IMPLANT
SUT VIC AB 2-0 SH 27 (SUTURE) ×3
SUT VIC AB 2-0 SH 27XBRD (SUTURE) ×3 IMPLANT
SUT VIC AB 3-0 SH 27 (SUTURE) ×3
SUT VIC AB 3-0 SH 27X BRD (SUTURE) ×3 IMPLANT
SYR CONTROL 10ML LL (SYRINGE) ×2 IMPLANT
TOWEL OR 17X24 6PK STRL BLUE (TOWEL DISPOSABLE) ×2 IMPLANT
TOWEL OR NON WOVEN STRL DISP B (DISPOSABLE) ×2 IMPLANT

## 2017-05-16 NOTE — Discharge Instructions (Signed)
[facility] °Office Phone Number [phone] °POST OP INSTRUCTIONS ° °Always review your discharge instruction sheet given to you by the facility where your surgery was performed. ° °IF YOU HAVE DISABILITY OR FAMILY LEAVE FORMS, YOU MUST BRING THEM TO THE OFFICE FOR PROCESSING.  DO NOT GIVE THEM TO YOUR DOCTOR. ° °1. A prescription for pain medication may be given to you upon discharge.  Take your pain medication as prescribed, if needed.  If narcotic pain medicine is not needed, then you may take acetaminophen (Tylenol), naprosyn (Alleve) or ibuprofen (Advil) as needed. °2. Take your usually prescribed medications unless otherwise directed °3. If you need a refill on your pain medication, please contact your pharmacy.  They will contact our office to request authorization.  Prescriptions will not be filled after 5pm or on week-ends. °4. You should eat very light the first 24 hours after surgery, such as soup, crackers, pudding, etc.  Resume your normal diet the day after surgery. °5. Most patients will experience some swelling and bruising in the breast.  Ice packs and a good support bra will help.  Wear the breast binder provided or a sports bra for 72 hours day and night.  After that wear a sports bra during the day until you return to the office. Swelling and bruising can take several days to resolve.  °6. It is common to experience some constipation if taking pain medication after surgery.  Increasing fluid intake and taking a stool softener will usually help or prevent this problem from occurring.  A mild laxative (Milk of Magnesia or Miralax) should be taken according to package directions if there are no bowel movements after 48 hours. °7. Unless discharge instructions indicate otherwise, you may remove your bandages 48 hours after surgery and you may shower at that time.  You may have steri-strips (small skin tapes) in place directly over the incision.  These strips should be left on the  skin for 7-10 days and will come off on their own.  If your surgeon used skin glue on the incision, you may shower in 24 hours.  The glue will flake off over the next 2-3 weeks.  Any sutures or staples will be removed at the office during your follow-up visit. °8. ACTIVITIES:  You may resume regular daily activities (gradually increasing) beginning the next day.  Wearing a good support bra or sports bra minimizes pain and swelling.  You may have sexual intercourse when it is comfortable. °a. You may drive when you no longer are taking prescription pain medication, you can comfortably wear a seatbelt, and you can safely maneuver your car and apply brakes. °b. RETURN TO WORK:  ______________________________________________________________________________________ °9. You should see your doctor in the office for a follow-up appointment approximately two weeks after your surgery.  Your doctor’s nurse will typically make your follow-up appointment when she calls you with your pathology report.  Expect your pathology report 3-4 business days after your surgery.  You may call to check if you do not hear from us after three days. °10. OTHER INSTRUCTIONS: _______________________________________________________________________________________________ _____________________________________________________________________________________________________________________________________ °_____________________________________________________________________________________________________________________________________ °_____________________________________________________________________________________________________________________________________ ° °WHEN TO CALL DR WAKEFIELD: °1. Fever over 101.0 °2. Nausea and/or vomiting. °3. Extreme swelling or bruising. °4. Continued bleeding from incision. °5. Increased pain, redness, or drainage from the incision. ° °The clinic staff is available to answer your questions during regular  business hours.  Please don’t hesitate to call and ask to speak to one of the nurses for clinical concerns.  If you   have a medical emergency, go to the nearest emergency room or call 911.  A surgeon from [facility] is always on call at the hospital.  For further questions, please visit [email or web address]   Post Anesthesia Home Care Instructions  Activity: Get plenty of rest for the remainder of the day. A responsible individual must stay with you for 24 hours following the procedure.  For the next 24 hours, DO NOT: -Drive a car -Paediatric nurse -Drink alcoholic beverages -Take any medication unless instructed by your physician -Make any legal decisions or sign important papers.  Meals: Start with liquid foods such as gelatin or soup. Progress to regular foods as tolerated. Avoid greasy, spicy, heavy foods. If nausea and/or vomiting occur, drink only clear liquids until the nausea and/or vomiting subsides. Call your physician if vomiting continues.  Special Instructions/Symptoms: Your throat may feel dry or sore from the anesthesia or the breathing tube placed in your throat during surgery. If this causes discomfort, gargle with warm salt water. The discomfort should disappear within 24 hours.  If you had a scopolamine patch placed behind your ear for the management of post- operative nausea and/or vomiting:  1. The medication in the patch is effective for 72 hours, after which it should be removed.  Wrap patch in a tissue and discard in the trash. Wash hands thoroughly with soap and water. 2. You may remove the patch earlier than 72 hours if you experience unpleasant side effects which may include dry mouth, dizziness or visual disturbances. 3. Avoid touching the patch. Wash your hands with soap and water after contact with the patch.    Call your surgeon if you experience:   1.  Fever over 101.0. 2.  Inability to urinate. 3.  Nausea and/or vomiting. 4.   Extreme swelling or bruising at the surgical site. 5.  Continued bleeding from the incision. 6.  Increased pain, redness or drainage from the incision. 7.  Problems related to your pain medication. 8.  Any problems and/or concerns

## 2017-05-16 NOTE — Anesthesia Postprocedure Evaluation (Signed)
Anesthesia Post Note  Patient: Martha [name]  Procedure(s) Performed: Procedure(s) (LRB): RIGHT RADIOACTIVE SEED GUIDED LUMPECTOMY  WITH  RIGHT BREAST SEED GUIDED EXCISIONAL BIOPSY AND RIGHT AXILLARY SENTINEL  NODE BIOPSY (Right)  Patient location during evaluation: PACU Anesthesia Type: General Level of consciousness: sedated and patient cooperative Pain management: pain level controlled Vital Signs Assessment: post-procedure vital signs reviewed and stable Respiratory status: spontaneous breathing Cardiovascular status: stable Anesthetic complications: no       Last Vitals:  Vitals:   05/16/17 1130 05/16/17 1145  BP: 134/60 (!) 152/78  Pulse: 88 76  Resp: (!) 21 (!) 21  Temp: 36.4 C     Last Pain:  Vitals:   05/16/17 1130  TempSrc:   PainSc: 0-No pain                 Nolon Nations

## 2017-05-16 NOTE — Progress Notes (Signed)
Patient ID: Martha [name], female   DOB: 08/05/1951, 66 y.o.   MRN: [id number] [place] reviewed day of surgery, postop norco given

## 2017-05-16 NOTE — Progress Notes (Signed)
Emotional support during breast injections °

## 2017-05-16 NOTE — H&P (Signed)
74 yof referred by Dr Lindi Adie for new right breast cancer. She has complicated history. she has fh in maternal aunt in 38s. no prior personal history. she underwent abnl mm last year that showed b density breasts and asymmetry in right breast. this underwent additional views as well as [place] that showed slighly irregular hypoechoic mass within the right breast measuring 6x3x3 mm in size. there is also at 9 oclock 1 cm from nipple a likely benign complicated cyst measuring 4x3x3 mm. option of follow up vs biopsy was given. she elected for biopsy. US guided biopsy of the 9 oclock mass 3 cm from nipple was done. the results was atypical papillary lesion cannot rule out lg dcis. she was then recommended for biopsy of the second lesion. she was then seen by surgery at outside insitution and was scheduled for right breast excisional biopsy with insertion of wire at time of surgery near [facility] clip. an [place] of tissue confirmed clip removal. no specimen mm was done I can find to see if lesion apparent. path was papilloma and adh. was discussed antiestrogen therapy. she decided against it. she then underwent six month folllow up. in this it appears that the clip in the first biopsy did not correlate with the asymmetry of concern on second biopsy. this mm shows surgical changes as well as the previous clip removed. the small asymmetry in the right breast is posterior to the surgical excision and measures 1 cm now. US shows a 4x2x3 mm lesion 1 cm from nipple. US guided biopsy of the small lesion 1 cm from nipple shows alh. 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 she has now also undergone mri that shows right breast uoq an area of nme that measures 3.3 cm and this may be related to postbiopsy changes. there is a 1 cm of nme at the biopsied alh. there is one indeterminate right axillary node that on second look [place] is now negative. the left breast is  negative.   Past Surgical History Sharyn Lull R. Brooks, CMA; 04/30/2017 10:49 AM) Breast Biopsy  Right. Breast Mass; Local Excision  Right. Cesarean Section - 1  Oral Surgery  Tonsillectomy   Diagnostic Studies History Sharyn Lull R. Brooks, CMA; 04/30/2017 10:49 AM) Colonoscopy  5-10 years ago Mammogram  within last year Pap Smear  1-5 years ago  Allergies Sharyn Lull R. Brooks, CMA; 04/30/2017 10:50 AM) No Known Drug Allergies 04/30/2017  Medication History Sharyn Lull R. Brooks, [place]; 04/30/2017 10:51 AM) Lisinopril-Hydrochlorothiazide (20-12.5MG  Tablet, Oral) Active. MetFORMIN HCl (500MG  Tablet, Oral) Active. Vitamin D3 (Oral) Specific strength unknown - Active. Aspirin (81MG  Tablet, Oral) Active. Simvastatin (40MG  Tablet, Oral) Active. Medications Reconciled  Social History Sharyn Lull R. Brooks, CMA; 04/30/2017 10:49 AM) Alcohol use  Moderate alcohol use. Caffeine use  Coffee. No drug use  Tobacco use  Former smoker.  Family History Sharyn Lull R. Brooks, CMA; 04/30/2017 10:49 AM) Alcohol Abuse  Father. Arthritis  Mother. Diabetes Mellitus  Father. Heart Disease  Father. Hypertension  Mother. Malignant Neoplasm Of Pancreas  Family Members In General. Ovarian Cancer  Family Members In General.  Pregnancy / Birth History Sharyn Lull R. Rolena Infante, CMA; 04/30/2017 10:49 AM) Age at menarche  65 years. Age of menopause  20-50 Contraceptive History  Oral contraceptives. Gravida  3 Maternal age  59-20 Para  3 Regular periods   Other Problems Sharyn Lull R. Brooks, CMA; 04/30/2017 10:49 AM) Arthritis  Breast Cancer  Diabetes Mellitus   Review of  Systems ([place]. Brooks CMA; 04/30/2017 10:49 AM) General Not Present- Appetite Loss, Chills, Fatigue, Fever, Night Sweats, Weight Gain and Weight Loss. Skin Not Present- Change in Wart/Mole, Dryness, Hives, Jaundice, New Lesions, Non-Healing Wounds, Rash and Ulcer. HEENT Not Present- Earache, Hearing Loss,  Hoarseness, Nose Bleed, Oral Ulcers, Ringing in the Ears, Seasonal Allergies, Sinus Pain, Sore Throat, Visual Disturbances, Wears glasses/contact lenses and Yellow Eyes. Respiratory Present- Snoring. Not Present- Bloody sputum, Chronic Cough, Difficulty Breathing and Wheezing. Breast Not Present- Breast Mass, Breast Pain, Nipple Discharge and Skin Changes. Cardiovascular Not Present- Chest Pain, Difficulty Breathing Lying Down, Leg Cramps, Palpitations, Rapid Heart Rate, Shortness of Breath and Swelling of Extremities. Gastrointestinal Present- Gets full quickly at meals. Not Present- Abdominal Pain, Bloating, Bloody Stool, Change in Bowel Habits, Chronic diarrhea, Constipation, Difficulty Swallowing, Excessive gas, Hemorrhoids, Indigestion, Nausea, Rectal Pain and Vomiting. Female Genitourinary Not Present- Frequency, Nocturia, Painful Urination, Pelvic Pain and Urgency. Musculoskeletal Present- Joint Pain and Joint Stiffness. Not Present- Back Pain, Muscle Pain, Muscle Weakness and Swelling of Extremities. Neurological Present- Numbness. Not Present- Decreased Memory, Fainting, Headaches, Seizures, Tingling, Tremor, Trouble walking and Weakness. Psychiatric Not Present- Anxiety, Bipolar, Change in Sleep Pattern, Depression, Fearful and Frequent crying. Endocrine Not Present- Cold Intolerance, Excessive Hunger, Hair Changes, Heat Intolerance, Hot flashes and New Diabetes. Hematology Not Present- Blood Thinners, Easy Bruising, Excessive bleeding, Gland problems, HIV and Persistent Infections.  Vitals [employer] R. Brooks CMA; 04/30/2017 10:49 AM) 04/30/2017 10:49 AM Weight: 202.25 lb Height: 62in Body Surface Area: 1.92 m Body Mass Index: 36.99 kg/m  Pulse: 74 (Regular)  BP: 132/80 (Sitting, Left Arm, Standard) Physical Exam [name] MD; 04/30/2017 11:56 AM) General Mental Status-Alert. Orientation-Oriented X3. Head and Neck Thyroid -Note: no thyromegaly, no thyroid  mass. Eye Sclera/Conjunctiva - Bilateral-No scleral icterus. Chest and Lung Exam Chest and lung exam reveals -quiet, even and easy respiratory effort with no use of accessory muscles and on auscultation, normal breath sounds, no adventitious sounds and normal vocal resonance. Breast Nipples-No Discharge. Breast Lump-No Palpable Breast Mass. Cardiovascular Cardiovascular examination reveals -normal heart sounds, regular rate and rhythm with no murmurs. Abdomen Note: soft nt Lymphatic Head & Neck General Head & Neck Lymphatics: Bilateral - Description - Normal. Axillary General Axillary Region: Bilateral - Description - Normal. Note: no Buckhorn adenopathy  Assessment & Plan [name] MD; 04/30/2017 4:09 PM) BREAST CANCER OF UPPER-OUTER QUADRANT OF RIGHT FEMALE BREAST (C50.411) right breast seed guided lumpectomy, right breast seed guided excisional biopsy of alh, right axillary sn biopsy we had a long discussion about history. it appears initial asymmetry was not removed at time of prior surgery. the clip was removed but this was not really in right location. the other area did not get biopsied. we discussed her pathology, pathophysiology of breast cancer and all available treatment options. e discussed a sentinel lymph node biopsy as she does not appear to having lymph node involvement right now. We discussed the performance of that with injection of radioactive tracer. We discussed that there is a chance of having a positive node with a sentinel lymph node biopsy and we will await the permanent pathology to make any other first further decisions in terms of her treatment. We discussed up to a 5% risk lifetime of chronic shoulder pain as well as lymphedema associated with a sentinel lymph node biopsy. We discussed the options for treatment of the breast cancer which included lumpectomy versus a mastectomy. We discussed the performance of the lumpectomy with radioactive seed  placement. We discussed a 5-10% chance of a positive margin requiring reexcision in the operating room. We also discussed that she will need radiation therapy if she undergoes lumpectomy. We discussed the mastectomy (removal of whole breast) and the postoperative care for that as well. Mastectomy can be followed by reconstruction. The decision for lumpectomy vs mastectomy has no impact on decision for chemotherapy. Most mastectomy patients will not need radiation therapy. We discussed that there is no difference in her survival whether she undergoes lumpectomy with radiation therapy or antiestrogen therapy versus a mastectomy. There is also no real difference between her recurrence in the breast. She needs alh lesion removed as well. will plan for lump/excisional biopsy and sn biopsy. will confirm at conference and call to schedule

## 2017-05-16 NOTE — Interval H&P Note (Signed)
History and Physical Interval Note:  05/16/2017 9:58 AM  Martha [name]  has presented today for surgery, with the diagnosis of right breast cancer and right breast mass  The various methods of treatment have been discussed with the patient and family. After consideration of risks, benefits and other options for treatment, the patient has consented to  Procedure(s) with comments: RIGHT RADIOACTIVE SEED GUIDED LUMPECTOMY  WITH  RIGHT BREAST SEED GUIDED EXCISIONAL BIOPSY AND RIGHT AXILLARY SENTINEL  NODE BIOPSY (Right) - 2 SEEDS RIGHT BREAST  GENERAL AND PEC BLOCK ANESHTESIA as a surgical intervention .  The patient's history has been reviewed, patient examined, no change in status, stable for surgery.  I have reviewed the patient's chart and labs.  Questions were answered to the patient's satisfaction.     Cameryn Chrisley

## 2017-05-16 NOTE — Op Note (Signed)
Preoperative diagnosis: Righttbreast cancer, clinical stage I Right breast mass with alh on core Postoperative diagnosis: same as above Procedure: 1. Rightbreast seed guided lumpectomy 2. Right deep axillary sentinel node biopsy 3. Right breast seed guided excisional biopsy Surgeon Dr Serita Grammes Anes general  EBL: minimal Comps none Specimen:  1. rightbreast lumpectomy marked with paint containing clip and seed 2. rightaxillary sentinel nodes with highest count 282 3. Right breast excisional biopsy marked with paint containing clip and seed Sponge count correct at completion Dispoto recovery stable  Indications: This is a 57 yof who presented over six months ago with abnl mm.  She had an excisional biopsy of the area that clip was at that was adh.  On six month follow up there was persistent mass and additional area that had never been biopsied.  Both of these were biopsied.  One was alh and the other was invasive cancer. We discussed seed guided lumpectomy, sentinel node biopsy and seed guided excision. She had two seeds placed prior to beginning and I had the mm in the OR  Procedure: After informed consent was obtained the patient was taken to the OR. She was injected with technetium in the standard periareolar fashion.She was given anitibiotics. SCDs were in place. She was prepped and draped in the standard sterile surgical fashion. A timeout was performed. She had undergone a pectoral block.  I then located the first seed near the areolar border. The second seed was about at [place]. I infiltrated marcaine around the areola and made a periareolar incision to hide the scar. I removed both seeds via the same incision.    I first located the alh lesion that was near areola. I then dissected to the seed.I then removed the seed.  This waspassed off the table after marking with paint.I did confirm removal of theclipand seed with mammography. I then redirected the  neoprobe superiorly and tunneled to the second lesion which was the cancer. I then used the neoprobe to direct excision of the seed and surrounding tissue with attempt to get a clear margin.  This was marked with paint. MM confirmed removal of the clip and the seed.  I obtained hemostasis. I placed clips in this cavity. I then closed with 2-0 vicryl, 3-0 vicryl and 5-0 monocryl. Glue and steristrips were applied. I then made an incision below the axillary hairline. I then opened the axillary fascia. I identified the sentinel nodes with the highest count as above. There was no gross nodal disease.There was no background radioactivity. I obtained hemostasis. I closed the axillary fascia and breast tissue with 2-0 vicryl. I then closed the dermis with 3-0 vicryl and the skin with 4-0 monocryl.  Glue and steristrips were placed.A breast binder was placed. She was extubated and transferred to recovery stable

## 2017-05-16 NOTE — Anesthesia Preprocedure Evaluation (Signed)
Anesthesia Evaluation  Patient identified by MRN, date of birth, ID band Patient awake    Reviewed: Allergy & Precautions, NPO status , Patient's Chart, lab work & pertinent test results  History of Anesthesia Complications Negative for: history of anesthetic complications  Airway Mallampati: II  TM Distance: >3 FB Neck ROM: Full    Dental no notable dental hx.    Pulmonary neg pulmonary ROS, former smoker,    Pulmonary exam normal breath sounds clear to auscultation       Cardiovascular hypertension, Pt. on medications Normal cardiovascular exam Rhythm:Regular Rate:Normal     Neuro/Psych negative neurological ROS     GI/Hepatic Neg liver ROS, GERD  ,  Endo/Other  diabetes, Type 2, Oral Hypoglycemic Agents  Renal/GU negative Renal ROS     Musculoskeletal   Abdominal   Peds  Hematology negative hematology ROS (+)   Anesthesia Other Findings   Reproductive/Obstetrics                             Anesthesia Physical  Anesthesia Plan  ASA: II  Anesthesia Plan: General   Post-op Pain Management:  Regional for Post-op pain   Induction: Intravenous  Airway Management Planned: LMA  Additional Equipment:   Intra-op Plan:   Post-operative Plan: Extubation in OR  Informed Consent: I have reviewed the patients History and Physical, chart, labs and discussed the procedure including the risks, benefits and alternatives for the proposed anesthesia with the patient or authorized representative who has indicated his/her understanding and acceptance.   Dental advisory given  Plan Discussed with: CRNA  Anesthesia Plan Comments:         Anesthesia Quick Evaluation

## 2017-05-16 NOTE — Progress Notes (Signed)
Assisted Dr. Germeroth with right, ultrasound guided, pectoralis block. Side rails up, monitors on throughout procedure. See vital signs in flow sheet. Tolerated Procedure well. 

## 2017-05-16 NOTE — Transfer of Care (Signed)
Immediate Anesthesia Transfer of Care Note  Patient: Martha [name]  Procedure(s) Performed: Procedure(s) with comments: RIGHT RADIOACTIVE SEED GUIDED LUMPECTOMY  WITH  RIGHT BREAST SEED GUIDED EXCISIONAL BIOPSY AND RIGHT AXILLARY SENTINEL  NODE BIOPSY (Right) - 2 SEEDS RIGHT BREAST  GENERAL AND PEC BLOCK ANESHTESIA  Patient Location: PACU  Anesthesia Type:GA combined with regional for post-op pain  Level of Consciousness: awake and patient cooperative  Airway & Oxygen Therapy: Patient Spontanous Breathing and Patient connected to face mask oxygen  Post-op Assessment: Report given to RN and Post -op Vital signs reviewed and stable  Post vital signs: Reviewed and stable  Last Vitals:  Vitals:   05/16/17 0958 05/16/17 0959  BP:    Pulse: 64 (!) 59  Resp: 16 14  Temp:      Last Pain:  Vitals:   05/16/17 0853  TempSrc: Oral  PainSc: 0-No pain      Patients Stated Pain Goal: 0 (41/66/06 3016)  Complications: No apparent anesthesia complications

## 2017-05-16 NOTE — Anesthesia Procedure Notes (Signed)
Anesthesia Regional Block: Pectoralis block   Pre-Anesthetic Checklist: ,, timeout performed, Correct Patient, Correct Site, Correct Laterality, Correct Procedure, Correct Position, site marked, Risks and benefits discussed,  Surgical consent,  Pre-op evaluation,  At surgeon's request and post-op pain management  Laterality: Right  Prep: chloraprep       Needles:   Needle Type: Stimiplex     Needle Length: 9cm      Additional Needles:   Procedures: ultrasound guided,,,,,,,,  Narrative:  Start time: 05/16/2017 9:36 AM End time: 05/16/2017 9:43 AM Injection made incrementally with aspirations every 5 mL.  Performed by: Personally  Anesthesiologist: Nolon Nations  Additional Notes: Patient tolerated well. Good fascial spread noted.

## 2017-05-16 NOTE — Anesthesia Procedure Notes (Signed)
Procedure Name: LMA Insertion Date/Time: 05/16/2017 10:14 AM Performed by: Marlowe Cinquemani D Pre-anesthesia Checklist: Patient identified, Emergency Drugs available, Suction available and Patient being monitored Patient Re-evaluated:Patient Re-evaluated prior to inductionOxygen Delivery Method: Circle system utilized Preoxygenation: Pre-oxygenation with 100% oxygen Intubation Type: IV induction Ventilation: Mask ventilation without difficulty LMA: LMA inserted LMA Size: 3.0 Number of attempts: 1 Airway Equipment and Method: Bite block Placement Confirmation: positive ETCO2 Tube secured with: Tape Dental Injury: Teeth and Oropharynx as per pre-operative assessment

## 2017-05-17 ENCOUNTER — Encounter (HOSPITAL_BASED_OUTPATIENT_CLINIC_OR_DEPARTMENT_OTHER): Payer: Self-pay | Admitting: General Surgery

## 2017-05-23 ENCOUNTER — Encounter: Payer: Self-pay | Admitting: Radiation Oncology

## 2017-05-24 NOTE — Progress Notes (Signed)
Location of Breast Cancer:Right Breast  Histology per Pathology Report: Diagnosis 04/20/2017: 1. Breast, right, needle core biopsy, 9:00 o'clock - LOBULAR NEOPLASIA (ATYPICAL LOBULAR HYPERPLASIA). - SEE COMMENT. 2. Breast, right, needle core biopsy, stereotatic, right upper outer quadrant REVISED DIAGNOSIS: - INVASIVE DUCTAL CARCINOMA. - DUCTAL CARCINOMA IN SITU   DIAGNOSIS: 11/27/217:  A. RIGHT BREAST, 9:00; NEEDLE LOCALIZED EXCISION:  - INTRADUCTAL PAPILLOMA WITH FOCAL ATYPICAL DUCTAL HYPERPLASIA.  - THE SURGICAL MARGINS ARE NEGATIVE.  - BIOPSY SITE CHANGES, MARKER CLIP PRESENT.    DIAGNOSIS: 11/02/2016: A. RIGHT BREAST, 9:00, 3 CMFN; BIOPSY:  - ATYPICAL PAPILLARY LESION, CANNOT EXCLUDE LOW GRADE DUCTAL CARCINOMA  IN SITU.  Receptor Status: ER(100%+), PR (100%+), Her2-neu (neg ratio 1.63), Ki-()  Did patient present with symptoms (if so, please note symptoms) or was this found on screening mammography?: routine screening  Past/Anticipated interventions by surgeon, if ULG:[id number] 05/16/2017: Dr. [name], MD 1. Breast, lumpectomy, Right - LOBULAR NEOPLASIA (ATYPICAL LOBULAR HYPERPLASIA) - HEALING BIOPSY SITE.- SEE COMMENT. 2. Breast, lumpectomy, Right - INVASIVE DUCTAL CARCINOMA, GRADE I/III, MICROSCOPIC FOCUS. - DUCTAL CARCINOMA IN SITU, LOW GRADE.- DUCTAL CARCINOMA IN SITU IS FOCALLY 0.1 CM TO THE SUPERIOR MARGIN OF SPECIMEN #2.- SEE ONCOLOGY TABLE BELOW. 3. Lymph node, sentinel, biopsy, Right axillary - THERE IS NO EVIDENCE OF CARCINOMA IN 1 OF 1 LYMPH NODE (0/1). 4. Lymph node, sentinel, biopsy, Right axillary - THERE IS NO EVIDENCE OF CARCINOMA IN 1 OF 1 LYMPH NODE (0/1). 5. Lymph node, sentinel, biopsy, Right axillary - THERE IS NO EVIDENCE OF CARCINOMA IN 1 OF 1 LYMPH NODE (0/1).  Past/Anticipated interventions by medical oncology, if any: Chemotherapy :Dr. Lindi [name] seen 04/26/2017  Lymphedema issues, if any:     Pain issues, if any:   SAFETY  ISSUES:  Prior radiation?   Pacemaker/ICD?  Is the patient on methotrexate?  Current Complaints / other details:      Rebecca Eaton, RN 05/24/2017,3:36 PM

## 2017-05-25 ENCOUNTER — Other Ambulatory Visit: Payer: Self-pay

## 2017-05-25 MED ORDER — METFORMIN HCL 500 MG PO TABS
500.0000 mg | ORAL_TABLET | Freq: Two times a day (BID) | ORAL | 0 refills | Status: DC
Start: 2017-05-25 — End: 2017-07-13

## 2017-05-25 MED ORDER — SIMVASTATIN 40 MG PO TABS: 40.0000 mg | ORAL_TABLET | Freq: Every day | ORAL | 0 refills | Status: DC

## 2017-05-29 ENCOUNTER — Encounter: Payer: Self-pay | Admitting: Radiation Oncology

## 2017-05-29 ENCOUNTER — Ambulatory Visit
Admission: RE | Admit: 2017-05-29 | Discharge: 2017-05-29 | Disposition: A | Discharge status: Home / Self Care | Payer: Medicare Other | Source: Ambulatory Visit | Attending: Radiation Oncology | Admitting: Radiation Oncology

## 2017-05-29 VITALS — BP 115/79 | HR 74 | Temp 98.4°F | Resp 18 | Ht 62.0 in | Wt 202.8 lb

## 2017-05-29 DIAGNOSIS — E119 Type 2 diabetes mellitus without complications: Secondary | ICD-10-CM | POA: Diagnosis not present

## 2017-05-29 DIAGNOSIS — Z510 Encounter for antineoplastic radiation therapy: Secondary | ICD-10-CM | POA: Insufficient documentation

## 2017-05-29 DIAGNOSIS — Z9889 Other specified postprocedural states: Secondary | ICD-10-CM | POA: Diagnosis not present

## 2017-05-29 DIAGNOSIS — C50411 Malignant neoplasm of upper-outer quadrant of right female breast: Secondary | ICD-10-CM

## 2017-05-29 DIAGNOSIS — Z800 Family history of malignant neoplasm of digestive organs: Secondary | ICD-10-CM | POA: Diagnosis not present

## 2017-05-29 DIAGNOSIS — K219 Gastro-esophageal reflux disease without esophagitis: Secondary | ICD-10-CM | POA: Diagnosis not present

## 2017-05-29 DIAGNOSIS — Z7982 Long term (current) use of aspirin: Secondary | ICD-10-CM | POA: Diagnosis not present

## 2017-05-29 DIAGNOSIS — Z807 Family history of other malignant neoplasms of lymphoid, hematopoietic and related tissues: Secondary | ICD-10-CM | POA: Diagnosis not present

## 2017-05-29 DIAGNOSIS — Z803 Family history of malignant neoplasm of breast: Secondary | ICD-10-CM | POA: Insufficient documentation

## 2017-05-29 DIAGNOSIS — Z79899 Other long term (current) drug therapy: Secondary | ICD-10-CM | POA: Diagnosis not present

## 2017-05-29 DIAGNOSIS — I10 Essential (primary) hypertension: Secondary | ICD-10-CM | POA: Diagnosis not present

## 2017-05-29 DIAGNOSIS — Z8041 Family history of malignant neoplasm of ovary: Secondary | ICD-10-CM | POA: Insufficient documentation

## 2017-05-29 DIAGNOSIS — Z7984 Long term (current) use of oral hypoglycemic drugs: Secondary | ICD-10-CM | POA: Diagnosis not present

## 2017-05-29 DIAGNOSIS — Z170 Estrogen receptor positive status [ER+]: Secondary | ICD-10-CM | POA: Diagnosis not present

## 2017-05-29 DIAGNOSIS — F17210 Nicotine dependence, cigarettes, uncomplicated: Secondary | ICD-10-CM | POA: Diagnosis not present

## 2017-05-29 DIAGNOSIS — Z8249 Family history of ischemic heart disease and other diseases of the circulatory system: Secondary | ICD-10-CM | POA: Insufficient documentation

## 2017-05-29 DIAGNOSIS — D0511 Intraductal carcinoma in situ of right breast: Secondary | ICD-10-CM | POA: Diagnosis not present

## 2017-05-29 HISTORY — DX: Endometrial hyperplasia, unspecified: N85.00

## 2017-05-29 NOTE — Progress Notes (Signed)
Location of Breast Cancer:Right Breast  Histology per Pathology Report: Diagnosis 04/20/2017: 1. Breast, right, needle core biopsy, 9:00 o'clock - LOBULAR NEOPLASIA (ATYPICAL LOBULAR HYPERPLASIA). - SEE COMMENT. 2. Breast, right, needle core biopsy, stereotatic, right upper outer quadrant REVISED DIAGNOSIS: - INVASIVE DUCTAL CARCINOMA. - DUCTAL CARCINOMA IN SITU   DIAGNOSIS: 11/27/217:  A. RIGHT BREAST, 9:00; NEEDLE LOCALIZED EXCISION:  - INTRADUCTAL PAPILLOMA WITH FOCAL ATYPICAL DUCTAL HYPERPLASIA.  - THE SURGICAL MARGINS ARE NEGATIVE.  - BIOPSY SITE CHANGES, MARKER CLIP PRESENT.    DIAGNOSIS: 11/02/2016: A. RIGHT BREAST, 9:00, 3 CMFN; BIOPSY:  - ATYPICAL PAPILLARY LESION, CANNOT EXCLUDE LOW GRADE DUCTAL CARCINOMA  IN SITU.  Receptor Status: ER(100%+), PR (100%+), Her2-neu (neg ratio 1.63), Ki-()  Did patient present with symptoms (if so, please note symptoms) or was this found on screening mammography?: routine screening  Past/Anticipated interventions by surgeon, if DJM:[id number] 05/16/2017: Dr. [name], MD 1. Breast, lumpectomy, Right - LOBULAR NEOPLASIA (ATYPICAL LOBULAR HYPERPLASIA) - HEALING BIOPSY SITE.- SEE COMMENT. 2. Breast, lumpectomy, Right - INVASIVE DUCTAL CARCINOMA, GRADE I/III, MICROSCOPIC FOCUS. - DUCTAL CARCINOMA IN SITU, LOW GRADE.- DUCTAL CARCINOMA IN SITU IS FOCALLY 0.1 CM TO THE SUPERIOR MARGIN OF SPECIMEN #2.- SEE ONCOLOGY TABLE BELOW. 3. Lymph node, sentinel, biopsy, Right axillary - THERE IS NO EVIDENCE OF CARCINOMA IN 1 OF 1 LYMPH NODE (0/1). 4. Lymph node, sentinel, biopsy, Right axillary - THERE IS NO EVIDENCE OF CARCINOMA IN 1 OF 1 LYMPH NODE (0/1). 5. Lymph node, sentinel, biopsy, Right axillary - THERE IS NO EVIDENCE OF CARCINOMA IN 1 OF 1 LYMPH NODE (0/1).  Past/Anticipated interventions by medical oncology, if any: Chemotherapy :Dr. Lindi Adie seen 04/26/2017  Lymphedema issues, if any:   NO  Pain issues, if any: tenderness and  occasional sharp pian in right breast  SAFETY ISSUES: No  Prior radiation?  No  Pacemaker/ICD? NO  Is the patient on methotrexate? NO  Current Complaints / other details:  Married, G3, P3 , has 1 step daughter makes 4 kids now, former smoker 1 pack week  For about age 66 -52 off and on, quit in 1990's, ocassional alcohol, no drug use,  Maternal aunt, cervical and breast cancer, dx in 67;s  now 66 years old had chemo,surgey and radiation, living, Mother  Sisters maternal  had panceratic cancer and one had lymphoma BP 115/79   Pulse 74   Temp 98.4 F (36.9 C)   Resp 18   Ht 5' 2" (1.575 m)   Wt 202 lb 12.8 oz (92 kg)   BMI 37.09 kg/m   Wt Readings from Last 3 Encounters:  05/29/17 202 lb 12.8 oz (92 kg)  05/16/17 200 lb 12.8 oz (91.1 kg)  04/28/17 202 lb (91.6 kg)  allergies:NKA    Rebecca Eaton, RN 05/29/2017,2:29 PM

## 2017-05-29 NOTE — Progress Notes (Signed)
Radiation Oncology         ([phone]) [phone] ________________________________  Name: Martha [name] MRN: [id number]  Date: 05/29/2017  DOB: 1951-10-19  PY:[id number], Mar Daring, MD  Serena Croissant, MD     REFERRING PHYSICIAN: Serena Croissant, MD   DIAGNOSIS: The encounter diagnosis was Malignant neoplasm of upper-outer quadrant of right female breast, unspecified estrogen receptor status (HCC).   HISTORY OF PRESENT ILLNESS: Martha [name] is a 66 y.o. female seen at the request of Dr. Pamelia Hoit. The patient had a screening mammogram on 10/03/16 revealing a possible right breast asymmetry. Diagnostic right mammogram and [place] on 10/17/16 showed two adjacent hypoechoic masses in the right breast at the 9:00 position 1-3 cm from the nipple measuring 0.4 x 0.3 x 0.3 cm (1 cm from the nipple) and 0.6 x 0.3 x 0.3 cm (3 cm from the nipple). Both masses were most suggestive of complicated cysts. US of the right axilla was negative. Biopsy of the right breast mass in the 9:00 position 3 cm from the nipple on 11/02/16 revealed an atypical papillary lesion, cannot exclude low grade DCIS. The patient then underwent needle localized excision of the right breast 9:00 position on 11/20/16 revealing an intraductal papilloma with focal atypical ductal hyperplasia and the surgical margins were negative. The patient presented to Dr. Pamelia Hoit on 01/15/17 to discuss use of Tamoxifen and he suggested continuing annual mammograms and breast exams.  The patient underwent diagnotic mammogram and US of the right breast on 04/09/17 as a 6 month follow up image. This showed a persistent hypoechoic circumscribed oval mass 1 cm from the nipple measuring 0.4 x 0.2 x 0.3 cm and a persistent asymmetry in the UOQ of the right breast middle depth. The patient underwent biopsies of the right breast on 04/20/17. Biopsy of the right breast 9:00 position revealed lobular neoplasia. Biopsy of the right breast UOQ revealed grade 1 invasive ductal carcinoma and  DCIS ER/PR +, HER2 -. Dr. Pamelia Hoit saw the patient again after her biopsies on 04/26/17 and he recommended breast conservation surgery, adjuvant radiation, followed by adjuvant antiestrogen therapy. The patient proceeded with right breast lumpectomies and sentinel lymph node biopsies on 05/16/17.  The first right lumpectomy site revealed lobular neoplasia. The second right lumpectomy site revealed a microscopic focus of grade 1 IDC, low grade DCIS, and the DCIS was focally 0.1 cm to the superior margin, none of the 3 nodes sampled contained disease. The patient presents today to discuss the role of radiation for the management of her disease.  PREVIOUS RADIATION THERAPY: No   PAST MEDICAL HISTORY:  Past Medical History:  Diagnosis Date  . Arthritis   . 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 Cancer (HCC) 04/2017   right breast cancer  . Colon polyp 2008  . Diabetes mellitus without complication (HCC)   . GERD (gastroesophageal reflux disease)    OCC-NO MEDS  . Headache    H/O MIGRAINES  . Hypertension   . Papilloma 11/20/2016   right breast  . Status post dilation of esophageal narrowing 2008       PAST SURGICAL HISTORY: Past Surgical History:  Procedure Laterality Date  . BREAST BIOPSY Right 11/02/2016   ATYPICAL PAPILLARY LESION,  . BREAST EXCISIONAL BIOPSY Right 11/20/2016   AHD  . BREAST LUMPECTOMY Right 11/20/2016   Procedure: BREAST LUMPECTOMY;  Surgeon: Kieth Brightly, MD;  Location: [facility] ORS;  Service: General;  Laterality: Right;  . COLONOSCOPY  2008   in [place]  . DILATION AND CURETTAGE OF UTERUS    . RADIOACTIVE SEED GUIDED MASTECTOMY WITH AXILLARY SENTINEL LYMPH NODE BIOPSY Right 05/16/2017   Procedure: RIGHT RADIOACTIVE SEED GUIDED LUMPECTOMY  WITH  RIGHT BREAST SEED GUIDED EXCISIONAL BIOPSY AND RIGHT AXILLARY SENTINEL  NODE BIOPSY;  Surgeon: [name], MD;  Location: [facility];  Service: General;  Laterality: Right;   2 SEEDS RIGHT BREAST  GENERAL AND PEC BLOCK ANESHTESIA     FAMILY HISTORY:  Family History  Problem Relation Age of Onset  . Hypertension Mother   . Heart disease Father 2  . Hypertension Daughter   . Cancer Maternal Aunt 80       breast and ovary  . Pancreatic cancer Maternal Aunt   . Lymphoma Maternal Aunt      SOCIAL HISTORY:  reports that she quit smoking about 26 years ago. Her smoking use included Cigarettes. She has a 1.25 pack-year smoking history. She has never used smokeless tobacco. She reports that she drinks alcohol. She reports that she does not use drugs.  The patient is married and lives in [place].   ALLERGIES: Patient has no known allergies.   MEDICATIONS:  Current Outpatient Prescriptions  Medication Sig Dispense Refill  . aspirin 81 MG tablet Take 81 mg by mouth daily.    . Cholecalciferol (VITAMIN D-3 PO) Take 2,000 Int'l Units by mouth daily.    . fluticasone (FLONASE) 50 MCG/ACT nasal spray Place 2 sprays into both nostrils daily. (Patient taking differently: Place 2 sprays into both nostrils daily as needed. ) 16 g 2  . lisinopril-hydrochlorothiazide (PRINZIDE,ZESTORETIC) 20-12.5 MG tablet Take 1 tablet by mouth daily. 90 tablet 3  . metFORMIN (GLUCOPHAGE) 500 MG tablet Take 1 tablet (500 mg total) by mouth 2 (two) times daily with a meal. 180 tablet 0  . simvastatin (ZOCOR) 40 MG tablet Take 1 tablet (40 mg total) by mouth daily. 90 tablet 0  . TRIAMCINOLONE ACETONIDE, TOP, 0.05 % OINT Apply 1 application topically as needed.     . cyclobenzaprine (FLEXERIL) 10 MG tablet Take 1 tablet (10 mg total) by mouth 3 (three) times daily as needed for muscle spasms. 30 tablet 0   No current facility-administered medications for this encounter.      REVIEW OF SYSTEMS: On review of systems, the patient reports that she is doing well overall. She denies any chest pain, shortness of breath, cough, fevers, chills, night sweats, unintended weight changes. She denies  any bowel or bladder disturbances, and denies abdominal pain, nausea or vomiting. Her only complaints are tenderness and occasional sharp pains in the right breast and some right breast swelling. A complete review of systems is obtained and is otherwise negative.   PHYSICAL EXAM:  Wt Readings from Last 3 Encounters:  05/29/17 202 lb 12.8 oz (92 kg)  05/16/17 200 lb 12.8 oz (91.1 kg)  04/28/17 202 lb (91.6 kg)   Temp Readings from Last 3 Encounters:  05/29/17 98.4 F (36.9 C)  05/16/17 97.5 F (36.4 C)  04/26/17 98.2 F (36.8 C) (Oral)   BP Readings from Last 3 Encounters:  05/29/17 115/79  05/16/17 (!) 150/74  04/26/17 (!) 129/58   Pulse Readings from Last 3 Encounters:  05/29/17 74  05/16/17 89  04/26/17 84   Pain Assessment Pain Score: 2  Pain Loc: Breast/10  In general this is a well appearing African-American female in no acute distress. She's alert and oriented x4 and appropriate throughout the examination. Cardiopulmonary assessment  is negative for acute distress and she exhibits normal effort. Breast exam revealed a well healed lumpectomy site with minimal induration and fullness deep to the incision site. No erythema is noted, and no cellulitic changes are noted, nor separation.  ECOG = 0  0 - Asymptomatic (Fully active, able to carry on all predisease activities without restriction)  1 - Symptomatic but completely ambulatory (Restricted in physically strenuous activity but ambulatory and able to carry out work of a light or sedentary nature. For example, light housework, office work)  2 - Symptomatic, <50% in bed during the day (Ambulatory and capable of all self care but unable to carry out any work activities. Up and about more than 50% of waking hours)  3 - Symptomatic, >50% in bed, but not bedbound (Capable of only limited self-care, confined to bed or chair 50% or more of waking hours)  4 - Bedbound (Completely disabled. Cannot carry on any self-care. Totally  confined to bed or chair)  5 - Death   Eustace Pen MM, Creech RH, Tormey DC, et al. [phone]). "Toxicity and response criteria of the [facility]". [place] Oncol. 5 (6): 649-55    LABORATORY DATA:  Lab Results  Component Value Date   WBC 8.8 07/20/2016   HGB 12.1 07/20/2016   HCT 36.9 07/20/2016   MCV 91.7 07/20/2016   PLT 323.0 07/20/2016   Lab Results  Component Value Date   NA 138 05/14/2017   K 4.2 05/14/2017   CL 101 05/14/2017   CO2 28 05/14/2017   Lab Results  Component Value Date   ALT 10 07/20/2016   AST 14 07/20/2016   ALKPHOS 107 07/20/2016   BILITOT 0.4 07/20/2016      RADIOGRAPHY: Mm Breast Surgical Specimen  Result Date: 05/16/2017 CLINICAL DATA:  Specimen radiograph status post right breast lumpectomy. EXAM: SPECIMEN RADIOGRAPH OF THE RIGHT BREAST COMPARISON:  Previous exam(s). FINDINGS: Status post excision of the right breast. The radioactive seed and biopsy marker clip are present, completely intact, and were marked for pathology. These findings were communicated with the OR at the time of surgery. IMPRESSION: Specimen radiograph of the right breast. Electronically Signed   By: Ammie Ferrier M.D.   On: 05/16/2017 12:20   Mm Breast Surgical Specimen  Result Date: 05/16/2017 CLINICAL DATA:  Specimen radiograph status post right breast lumpectomy. EXAM: SPECIMEN RADIOGRAPH OF THE RIGHT BREAST COMPARISON:  Previous exam(s). FINDINGS: Status post excision of the right breast. The radioactive seed and biopsy marker clip are present, completely intact, and were marked for pathology. These findings were communicated with the OR at 10:44 a.m. IMPRESSION: Specimen radiograph of the right breast. Electronically Signed   By: Ammie Ferrier M.D.   On: 05/16/2017 10:45   [place] Axilla Right  Result Date: 04/30/2017 CLINICAL DATA:  Patient with known invasive ductal carcinoma and DCIS in the upper-outer quadrant of the right breast. Atypical lobular  neoplasia in the 9 o'clock region of the right breast. Recent MRI showed an indeterminate right axillary lymph node. EXAM: ULTRASOUND OF THE RIGHT BREAST COMPARISON:  Previous exam(s). FINDINGS: On physical exam, I do not palpate a discrete mass in the right axilla. Targeted ultrasound is performed, showing normal right lymph nodes. A lymph node measuring 1.7 cm is seen in the lower right axilla. The cortex measured 3 mm. No enlarged axillary adenopathy identified. IMPRESSION: No enlarged right axillary adenopathy. RECOMMENDATION: Treatment planning for the known right breast invasive ductal carcinoma and ductal carcinoma in-situ  as well as the atypical lobular neoplasia recommended. Correlation with sentinel lymph node biopsy recommended. I have discussed the findings and recommendations with the patient. Results were also provided in writing at the conclusion of the visit. If applicable, a reminder letter will be sent to the patient regarding the next appointment. BI-RADS CATEGORY  6: Known biopsy-proven malignancy. Electronically Signed   By: Lillia Mountain M.D.   On: 04/30/2017 14:19   Mm Rt Radioactive Seed Loc Mammo Guide  Result Date: 05/15/2017 CLINICAL DATA:  Localization of 2 sites in the right breast. The more anterior site demonstrated atypical lobular hyperplasia. The more posterior site demonstrated grade 1 invasive ductal carcinoma with carcinoma in situ. EXAM: MAMMOGRAPHIC GUIDED RADIOACTIVE SEED LOCALIZATION OF THE RIGHT BREAST COMPARISON:  Previous exam(s). FINDINGS: Patient presents for radioactive seed localization prior to surgery. I met with the patient and we discussed the procedure of seed localization including benefits and alternatives. We discussed the high likelihood of a successful procedure. We discussed the risks of the procedure including infection, bleeding, tissue injury and further surgery. We discussed the low dose of radioactivity involved in the procedure. Informed, written  consent was given. The usual time-out protocol was performed immediately prior to the procedure. Using mammographic guidance, sterile technique, 1% lidocaine and an I-125 radioactive seed, the site of known malignancy was localized using a superior approach. The follow-up mammogram images confirm the seed in the expected location and were marked for the surgeon. Follow-up survey of the patient confirms presence of the radioactive seed. Order number of I-125 seed:  [id number]. Total activity:  5.009 millicuries  Reference Date: May 14, 2017 Using mammographic guidance, sterile technique, 1% lidocaine and an I-125 radioactive seed, the site of atypical lobular hyperplasia was localized using a superior approach. The follow-up mammogram images confirm the seed in the expected location and were marked for the surgeon. Follow-up survey of the patient confirms presence of the radioactive seed. Order number of I-125 seed:  [id number]. Total activity:  0.254 milli  Reference Date: April 20, 2017 The patient tolerated the procedure well and was released from the [facility]. She was given instructions regarding seed removal. IMPRESSION: Radioactive seed localization of 2 sites in the left breast breast. No apparent complications. Electronically Signed   By: Dorise Bullion III M.D   On: 05/15/2017 11:51   Mm Rt Radio Seed Ea Add Lesion Loc Mammo  Result Date: 05/15/2017 CLINICAL DATA:  Localization of 2 sites in the right breast. The more anterior site demonstrated atypical lobular hyperplasia. The more posterior site demonstrated grade 1 invasive ductal carcinoma with carcinoma in situ. EXAM: MAMMOGRAPHIC GUIDED RADIOACTIVE SEED LOCALIZATION OF THE RIGHT BREAST COMPARISON:  Previous exam(s). FINDINGS: Patient presents for radioactive seed localization prior to surgery. I met with the patient and we discussed the procedure of seed localization including benefits and alternatives. We discussed the high likelihood of a  successful procedure. We discussed the risks of the procedure including infection, bleeding, tissue injury and further surgery. We discussed the low dose of radioactivity involved in the procedure. Informed, written consent was given. The usual time-out protocol was performed immediately prior to the procedure. Using mammographic guidance, sterile technique, 1% lidocaine and an I-125 radioactive seed, the site of known malignancy was localized using a superior approach. The follow-up mammogram images confirm the seed in the expected location and were marked for the surgeon. Follow-up survey of the patient confirms presence of the radioactive seed. Order number of I-125 seed:  [id number].  Total activity:  9.417 millicuries  Reference Date: May 14, 2017 Using mammographic guidance, sterile technique, 1% lidocaine and an I-125 radioactive seed, the site of atypical lobular hyperplasia was localized using a superior approach. The follow-up mammogram images confirm the seed in the expected location and were marked for the surgeon. Follow-up survey of the patient confirms presence of the radioactive seed. Order number of I-125 seed:  [id number]. Total activity:  0.254 milli  Reference Date: April 20, 2017 The patient tolerated the procedure well and was released from the [facility]. She was given instructions regarding seed removal. IMPRESSION: Radioactive seed localization of 2 sites in the left breast breast. No apparent complications. Electronically Signed   By: Dorise Bullion III M.D   On: 05/15/2017 11:51       IMPRESSION/PLAN: 1. 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 Dr. Lisbeth Renshaw discussed the pathology findings and reviewed the nature of invasive breast cancer. The patient completed surgery and is healing well. The patient's course will now be followed by external radiotherapy to the right breast followed by antiestrogen therapy. We discussed the  risks, benefits, short, and long term effects of radiotherapy, and the patient is interested in proceeding. Dr. Lisbeth Renshaw discussed the delivery and logistics of radiotherapy. Written consent is obtained and a copy provided to her. We will move forward with CT simulation on June 08, 2017 and anticipate starting radiotherapy the week of June 13, 2017.  In a visit lasting 60 minutes, greater than 50% of the time was spent face to face discussing her pathology, radiotherapy recommendations, and coordinating the patient's care.  The above documentation reflects my direct findings during this shared patient visit. Please see the separate note by Dr. Lisbeth Renshaw on this date for the remainder of the patient's plan of care.    Carola Rhine, PAC  This document serves as a record of services personally performed by Shona Simpson, PA-C and Kyung Rudd, MD. It was created on their behalf by Darcus Austin, a trained medical scribe. The creation of this record is based on the scribe's personal observations and the providers' statements to them. This document has been checked and approved by the attending provider.

## 2017-05-29 NOTE — Progress Notes (Signed)
Please see the Nurse Progress Note in the MD Initial Consult Encounter for this patient. 

## 2017-05-31 ENCOUNTER — Ambulatory Visit: Payer: Medicare Other | Attending: General Surgery | Admitting: Physical Therapy

## 2017-05-31 ENCOUNTER — Encounter: Payer: Self-pay | Admitting: Physical Therapy

## 2017-05-31 DIAGNOSIS — M25511 Pain in right shoulder: Secondary | ICD-10-CM | POA: Insufficient documentation

## 2017-05-31 DIAGNOSIS — M6281 Muscle weakness (generalized): Secondary | ICD-10-CM | POA: Diagnosis not present

## 2017-05-31 DIAGNOSIS — R600 Localized edema: Secondary | ICD-10-CM | POA: Insufficient documentation

## 2017-05-31 DIAGNOSIS — M25611 Stiffness of right shoulder, not elsewhere classified: Secondary | ICD-10-CM | POA: Insufficient documentation

## 2017-05-31 NOTE — Therapy (Signed)
[place], [place], [address] Phone: [phone]   Fax:  [phone]  Physical Therapy Evaluation  Patient Details  Name: Martha [name] MRN: [id number] Date of Birth: 1951/04/29 Referring Provider: Donne Hazel  Encounter Date: 05/31/2017      PT End of Session - 05/31/17 1228    Visit Number 1   Number of Visits 9   Date for PT Re-Evaluation 06/28/17   PT Start Time 1018   PT Stop Time 1059   PT Time Calculation (min) 41 min   Activity Tolerance Patient tolerated treatment well   Behavior During Therapy [facility] for tasks assessed/performed      Past Medical History:  Diagnosis Date  . Arthritis   . 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 Cancer ([place]) 04/2017   right breast cancer  . Colon polyp 2008  . Diabetes mellitus without complication ([place])   . Endometrial hyperplasia 2013  . GERD (gastroesophageal reflux disease)    OCC-NO MEDS  . Headache    H/O MIGRAINES  . Hypertension   . Papilloma 11/20/2016   right breast  . Status post dilation of esophageal narrowing 2008    Past Surgical History:  Procedure Laterality Date  . BREAST BIOPSY Right 11/02/2016   ATYPICAL PAPILLARY LESION,  . BREAST EXCISIONAL BIOPSY Right 11/20/2016   AHD  . BREAST LUMPECTOMY Right 11/20/2016   Procedure: BREAST LUMPECTOMY;  Surgeon: [name], MD;  Location: [facility] ORS;  Service: General;  Laterality: Right;  . COLONOSCOPY  2008   in [place]  . DILATION AND CURETTAGE OF UTERUS    . RADIOACTIVE SEED GUIDED MASTECTOMY WITH AXILLARY SENTINEL LYMPH NODE BIOPSY Right 05/16/2017   Procedure: RIGHT RADIOACTIVE SEED GUIDED LUMPECTOMY  WITH  RIGHT BREAST SEED GUIDED EXCISIONAL BIOPSY AND RIGHT AXILLARY SENTINEL  NODE BIOPSY;  Surgeon: [name], MD;  Location: [facility];  Service: General;  Laterality: Right;  2 SEEDS RIGHT BREAST  GENERAL AND PEC BLOCK ANESHTESIA     There were no vitals filed for this visit.       Subjective Assessment - 05/31/17 1022    Subjective I had a lumpectomy on 05/16/17. I had 1 or 3 nodes biopsied I am not sure how many. I have tightness when I lift my arm. I was having some swelling but that seems to have gone away.    Patient Stated Goals to get full range of motion   Currently in Pain? No/denies   Pain Score 0-No pain            [facility] PT Assessment - 05/31/17 0001      Assessment   Medical Diagnosis right breast cancer   Referring Provider Donne Hazel   Onset Date/Surgical Date 05/16/17   Hand Dominance Right   Prior Therapy none     Precautions   Precautions Other (comment)  at risk for lymphedema     Restrictions   Weight Bearing Restrictions No     Balance Screen   Has the patient fallen in the past 6 months No   Has the patient had a decrease in activity level because of a fear of falling?  No   Is the patient reluctant to leave their home because of a fear of falling?  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   Vocation Requirements standing, washing hair, cutting hair   Leisure goes to the gym 3x/wk - treadmill and bike - has not been since surgery     Cognition   Overall Cognitive Status Within Functional Limits for tasks assessed     Observation/Other Assessments   Skin Integrity steri strips and glue intact on SLNB and lumpectomy scar     AROM   Right Shoulder Flexion 130 Degrees   Right Shoulder ABduction 110 Degrees   Right Shoulder Internal Rotation 45 Degrees   Right Shoulder  External Rotation 70 Degrees   Left Shoulder Flexion 168 Degrees   Left Shoulder ABduction 172 Degrees   Left Shoulder Internal Rotation 72 Degrees   Left Shoulder External Rotation 90 Degrees           LYMPHEDEMA/ONCOLOGY QUESTIONNAIRE - 05/31/17 1040      Type   Cancer Type right breast cancer     Surgeries   Lumpectomy Date 05/16/17   Sentinel Lymph Node Biopsy Date 05/16/17   Number Lymph Nodes Removed 1  or 3     Treatment   Active Chemotherapy Treatment No   Past Chemotherapy Treatment No   Active Radiation Treatment No  will begin radiation in 2 weeks   Past Radiation Treatment No   Current Hormone Treatment No  pt unsure if she will require   Past Hormone Therapy No     What other symptoms do you have   Are you Having Heaviness or Tightness Yes  with ROM   Are you having Pain No   Are you having pitting edema No   Is it Hard or Difficult finding clothes that fit No   Do you have infections No   Is there Decreased scar mobility --  scar is still healing     Lymphedema Assessments   Lymphedema Assessments Upper extremities     Right Upper Extremity Lymphedema   15 cm Proximal to Olecranon Process 37.4 cm   Olecranon Process 27.8 cm   15 cm Proximal to Ulnar Styloid Process 26 cm   Just Proximal to Ulnar Styloid Process 17.5 cm   Across Hand at [employer] 20.2 cm   At [place] of 2nd Digit 7.3 cm     Left Upper Extremity Lymphedema   15 cm Proximal to Olecranon Process 34.9 cm   Olecranon Process 27.8 cm   15 cm Proximal to Ulnar Styloid Process 25.5 cm   Just Proximal to Ulnar Styloid Process 16.9 cm   Across Hand at [employer] 20.2 cm   At [place] of 2nd Digit 7 cm           Quick Dash - 05/31/17 0001    Open a tight or new jar Moderate difficulty   Do heavy household chores (wash walls, wash floors) Moderate difficulty   Carry a shopping bag or briefcase Moderate difficulty   Wash your back Moderate difficulty   Use a knife to cut food  No difficulty   Recreational activities in which you take some force or impact through your arm, shoulder, or hand (golf, hammering, tennis) Unable   During the past week, to  what extent has your arm, shoulder or hand problem interfered with your normal social activities with family, friends, neighbors, or groups? Not at all   During the past week, to what extent has your arm, shoulder or hand problem limited your work or other regular daily activities Modererately   Arm, shoulder, or hand pain. Moderate   Tingling (pins and needles) in your arm, shoulder, or hand Moderate   Difficulty Sleeping Moderate difficulty   DASH Score 45.45 %      Objective measurements completed on examination: See above findings.          [facility] Adult PT Treatment/Exercise - 05/31/17 0001      Shoulder Exercises: Supine   Flexion AAROM;Both;10 reps  dowel   ABduction AAROM;Right;10 reps  dowel     Shoulder Exercises: Standing   Other Standing Exercises post op breast exercises x 5 reps each                        [facility] Goals - 05/31/17 1237      CC Long Term Goal  #1   Title Pt will be able to independently verbalize lymphedema risk reduction practices   Time 4   Period Weeks   Status New     CC Long Term Goal  #2   Title Pt will demonstrate 165 degrees of right shoulder flexion to allow her to attain position necessary for radiation   Baseline 130   Time 4   Period Weeks   Status New     CC Long Term Goal  #3   Title Pt will demonstrate 165 degrees of right shoulder abduction to allow her to attain position necessary for radiation and reach items out to sides   Baseline 110   Time 4   Period Weeks   Status New     CC Long Term Goal  #4   Title Pt will be independent in a hom exercise program for strengthening and stretching   Time 4   Status New     CC Long Term Goal  #5   Title Pt will report a 75% improvement in tightness with overhead motion to allow  improved comfort   Time 4   Period Weeks   Status New             Plan - 05/31/17 1229    Clinical Impression Statement Patient presents to PT following a right lumpectomy and sentinel lymph node biopsy and subsequent decrease right shoulder ROM. Patient states she has tightness when lifting her arm up. She stated she was having some swelling in her right breast but that has cleared up. CIrcumferential measurements were taken today and 15 cm proximal to olecranon process revealed a 3 cm difference with right larger than left. Patient does have some edema present that is most likely post surgical. She will begin radiation in the next few weeks and will need to be able to obtain proper positioning for this. She would benefit from skilled PT services to increase right shoulder ROM and strength (pt would like to go back to the gym), decrease edema, and assist pt with getting a prophylactic compression sleeve.   History and Personal Factors relevant to plan of care: pt is right handed, diabetes   Clinical Presentation Evolving   Clinical Presentation due to: pt will begin radiation   Clinical Decision Making Moderate   Rehab Potential Good   Clinical Impairments Affecting Rehab Potential  will begin radiation   PT Frequency 2x / week   PT Duration 4 weeks   PT Treatment/Interventions ADLs/Self Care Home Management;Therapeutic exercise;Patient/family education;Orthotic Fit/Training;Compression bandaging;Manual lymph drainage;Manual techniques;Passive range of motion;Taping   PT Next Visit Plan AA/A/PROM to R shoulder, ROM/gentle strengthening, issue lymphedema risk reduction handout, assist with compression sleeve and gauntlet for pt to wear at gym (may wait until RUE reduces)   PT Home Exercise Plan post op breast exercises, supine dowel exercises   Consulted and Agree with Plan of Care Patient      Patient will benefit from skilled therapeutic intervention in order to improve the following  deficits and impairments:  Increased fascial restricitons, Pain, Decreased scar mobility, Impaired UE functional use, Decreased strength, Decreased range of motion, Increased edema  Visit Diagnosis: Stiffness of right shoulder, not elsewhere classified - Plan: PT plan of care cert/re-cert  Acute pain of right shoulder - Plan: PT plan of care cert/re-cert  Muscle weakness (generalized) - Plan: PT plan of care cert/re-cert  Localized edema - Plan: PT plan of care cert/re-cert      G-Codes - 94/50/38 1242    Functional Assessment Tool Used (Outpatient Only) Quick Dash   Functional Limitation Carrying, moving and handling objects   Carrying, Moving and Handling Objects Current Status (U8280) At least 40 percent but less than 60 percent impaired, limited or restricted   Carrying, Moving and Handling Objects Goal Status (K3491) At least 1 percent but less than 20 percent impaired, limited or restricted       Problem List Patient Active Problem List   Diagnosis Date Noted  . Malignant neoplasm of upper-outer quadrant of right breast in female, estrogen receptor positive ([place]) 04/26/2017  . Low back pain 01/29/2017  . Atypical ductal hyperplasia of right breast 01/15/2017  . Allergic rhinitis 04/12/2015  . Diabetes mellitus type 2, controlled ([place]) 07/24/2013  . Hyperlipidemia 07/24/2013  . Essential hypertension, benign 07/24/2013  . Status post dilation of esophageal narrowing 07/24/2013  . Obesity, unspecified 07/24/2013    Allyson Sabal [facility] 05/31/2017, 12:45 PM  [place], [place], [address] Phone: [phone]   Fax:  [phone]  Name: Martha [name] MRN: [id number] Date of Birth: 15-Oct-1951  Manus Gunning, PT 05/31/17 12:45 PM

## 2017-05-31 NOTE — Patient Instructions (Signed)
Shoulder: Flexion (Supine)    With hands shoulder width apart, slowly lower dowel to floor behind head. Do not let elbows bend. Keep back flat. Hold _10-15___ seconds. Repeat _10___ times. Do _2___ sessions per day. CAUTION: Stretch slowly and gently.  Copyright  VHI. All rights reserved.  Shoulder: Abduction (Supine)    With right arm flat on floor, hold dowel in palm. Slowly move arm up to side of head by pushing with opposite arm. Do not let elbow bend. Hold _10-15___ seconds. Repeat _10___ times. Do _2___ sessions per day. CAUTION: Stretch slowly and gently.  Copyright  VHI. All rights reserved.   

## 2017-06-06 ENCOUNTER — Ambulatory Visit: Payer: Medicare Other

## 2017-06-07 ENCOUNTER — Ambulatory Visit
Admission: RE | Admit: 2017-06-07 | Discharge: 2017-06-07 | Disposition: A | Discharge status: Home / Self Care | Payer: Medicare Other | Source: Ambulatory Visit | Attending: Radiation Oncology | Admitting: Radiation Oncology

## 2017-06-07 ENCOUNTER — Other Ambulatory Visit (INDEPENDENT_AMBULATORY_CARE_PROVIDER_SITE_OTHER): Payer: Medicare Other

## 2017-06-07 DIAGNOSIS — Z170 Estrogen receptor positive status [ER+]: Principal | ICD-10-CM

## 2017-06-07 DIAGNOSIS — Z7982 Long term (current) use of aspirin: Secondary | ICD-10-CM | POA: Diagnosis not present

## 2017-06-07 DIAGNOSIS — Z7984 Long term (current) use of oral hypoglycemic drugs: Secondary | ICD-10-CM | POA: Diagnosis not present

## 2017-06-07 DIAGNOSIS — I10 Essential (primary) hypertension: Secondary | ICD-10-CM

## 2017-06-07 DIAGNOSIS — C50411 Malignant neoplasm of upper-outer quadrant of right female breast: Secondary | ICD-10-CM

## 2017-06-07 DIAGNOSIS — Z803 Family history of malignant neoplasm of breast: Secondary | ICD-10-CM | POA: Diagnosis not present

## 2017-06-07 DIAGNOSIS — E785 Hyperlipidemia, unspecified: Secondary | ICD-10-CM

## 2017-06-07 DIAGNOSIS — K219 Gastro-esophageal reflux disease without esophagitis: Secondary | ICD-10-CM | POA: Diagnosis not present

## 2017-06-07 DIAGNOSIS — Z9889 Other specified postprocedural states: Secondary | ICD-10-CM | POA: Diagnosis not present

## 2017-06-07 DIAGNOSIS — Z510 Encounter for antineoplastic radiation therapy: Secondary | ICD-10-CM | POA: Diagnosis not present

## 2017-06-07 DIAGNOSIS — R5383 Other fatigue: Secondary | ICD-10-CM

## 2017-06-07 DIAGNOSIS — E119 Type 2 diabetes mellitus without complications: Secondary | ICD-10-CM | POA: Diagnosis not present

## 2017-06-07 DIAGNOSIS — Z807 Family history of other malignant neoplasms of lymphoid, hematopoietic and related tissues: Secondary | ICD-10-CM | POA: Diagnosis not present

## 2017-06-07 DIAGNOSIS — Z79899 Other long term (current) drug therapy: Secondary | ICD-10-CM | POA: Diagnosis not present

## 2017-06-07 DIAGNOSIS — Z800 Family history of malignant neoplasm of digestive organs: Secondary | ICD-10-CM | POA: Diagnosis not present

## 2017-06-07 DIAGNOSIS — Z8249 Family history of ischemic heart disease and other diseases of the circulatory system: Secondary | ICD-10-CM | POA: Diagnosis not present

## 2017-06-07 LAB — COMPREHENSIVE METABOLIC PANEL
ALT: 6 U/L (ref 0–35)
AST: 11 U/L (ref 0–37)
Albumin: 4 g/dL (ref 3.5–5.2)
Alkaline Phosphatase: 107 U/L (ref 39–117)
BUN: 18 mg/dL (ref 6–23)
CO2: 30 mEq/L (ref 19–32)
Calcium: 9.7 mg/dL (ref 8.4–10.5)
Chloride: 104 mEq/L (ref 96–112)
Creatinine, Ser: 1.03 mg/dL (ref 0.40–1.20)
GFR: 68.74 mL/min (ref >60.00)
Glucose, Bld: 114 mg/dL — ABNORMAL HIGH (ref 70–99)
Potassium: 4.4 mEq/L (ref 3.5–5.1)
Sodium: 140 mEq/L (ref 135–145)
Total Bilirubin: 0.3 mg/dL (ref 0.2–1.2)
Total Protein: 6.7 g/dL (ref 6.0–8.3)

## 2017-06-07 LAB — VITAMIN D 25 HYDROXY (VIT D DEFICIENCY, FRACTURES): VITD: 22.94 ng/mL — ABNORMAL LOW (ref 30.00–100.00)

## 2017-06-07 LAB — LIPID PANEL
Cholesterol: 160 mg/dL (ref 0–200)
HDL: 52.8 mg/dL (ref >39.00)
LDL Cholesterol: 91 mg/dL (ref 0–99)
NonHDL: 107.48
Total CHOL/HDL Ratio: 3
Triglycerides: 82 mg/dL (ref 0.0–149.0)
VLDL: 16.4 mg/dL (ref 0.0–40.0)

## 2017-06-07 LAB — HEMOGLOBIN A1C: Hgb A1c MFr Bld: 6.6 % — ABNORMAL HIGH (ref 4.6–6.5)

## 2017-06-07 LAB — TSH: TSH: 1.2 u[IU]/mL (ref 0.35–4.50)

## 2017-06-07 LAB — LDL CHOLESTEROL, DIRECT: Direct LDL: 83 mg/dL

## 2017-06-07 NOTE — Progress Notes (Signed)
  Radiation Oncology         [phone] ________________________________  Name: Martha [name] MRN: [id number]  Date: 06/07/2017  DOB: 23-Mar-1951   DIAGNOSIS:     ICD-10-CM   1. Malignant neoplasm of upper-outer quadrant of right breast in female, estrogen receptor positive ([place]) C50.411    Z17.0     SIMULATION AND TREATMENT PLANNING NOTE  The patient presented for simulation prior to beginning her course of radiation treatment for her diagnosis of Right-sided breast cancer. The patient was placed in a supine position on a breast board. A customized vac-lock bag was constructed and this complex treatment device will be used on a daily basis during her treatment. In this fashion, a CT scan was obtained through the chest area and an isocenter was placed near the chest wall within the breast.  The patient will be planned to receive a course of radiation initially to a dose of 42.5 Gy. This will consist of a whole breast radiotherapy technique. To accomplish this, 2 customized blocks have been designed which will correspond to medial and lateral whole breast tangent fields. This treatment will be accomplished at 2.5 Gy per fraction. A forward planning technique will also be evaluated to determine if this approach improves the plan. It is anticipated that the patient will then receive a 10 Gy boost to the seroma cavity which has been contoured. This will be accomplished at 2.5 Gy per fraction.   This initial treatment will consist of a 3-D conformal technique. The seroma has been contoured as the primary target structure. Additionally, dose volume histograms of both this target as well as the lungs and heart will also be evaluated. Such an approach is necessary to ensure that the target area is adequately covered while the nearby critical  normal structures are adequately spared.  Plan:  The final anticipated total dose therefore will correspond to 52.5  Gy.    _______________________________   Jodelle Gross, MD, PhD

## 2017-06-08 ENCOUNTER — Other Ambulatory Visit: Payer: Self-pay | Admitting: Internal Medicine

## 2017-06-08 ENCOUNTER — Ambulatory Visit: Payer: Medicare Other | Admitting: Physical Therapy

## 2017-06-08 ENCOUNTER — Encounter: Payer: Self-pay | Admitting: Internal Medicine

## 2017-06-08 DIAGNOSIS — Z803 Family history of malignant neoplasm of breast: Secondary | ICD-10-CM | POA: Diagnosis not present

## 2017-06-08 DIAGNOSIS — Z8249 Family history of ischemic heart disease and other diseases of the circulatory system: Secondary | ICD-10-CM | POA: Diagnosis not present

## 2017-06-08 DIAGNOSIS — Z170 Estrogen receptor positive status [ER+]: Secondary | ICD-10-CM | POA: Diagnosis not present

## 2017-06-08 DIAGNOSIS — C50411 Malignant neoplasm of upper-outer quadrant of right female breast: Secondary | ICD-10-CM | POA: Diagnosis not present

## 2017-06-08 DIAGNOSIS — R600 Localized edema: Secondary | ICD-10-CM

## 2017-06-08 DIAGNOSIS — Z79899 Other long term (current) drug therapy: Secondary | ICD-10-CM | POA: Diagnosis not present

## 2017-06-08 DIAGNOSIS — Z7984 Long term (current) use of oral hypoglycemic drugs: Secondary | ICD-10-CM | POA: Diagnosis not present

## 2017-06-08 DIAGNOSIS — I10 Essential (primary) hypertension: Secondary | ICD-10-CM | POA: Diagnosis not present

## 2017-06-08 DIAGNOSIS — Z7982 Long term (current) use of aspirin: Secondary | ICD-10-CM | POA: Diagnosis not present

## 2017-06-08 DIAGNOSIS — M25611 Stiffness of right shoulder, not elsewhere classified: Secondary | ICD-10-CM | POA: Diagnosis not present

## 2017-06-08 DIAGNOSIS — M25511 Pain in right shoulder: Secondary | ICD-10-CM | POA: Diagnosis not present

## 2017-06-08 DIAGNOSIS — E119 Type 2 diabetes mellitus without complications: Secondary | ICD-10-CM | POA: Diagnosis not present

## 2017-06-08 DIAGNOSIS — M6281 Muscle weakness (generalized): Secondary | ICD-10-CM

## 2017-06-08 DIAGNOSIS — Z9889 Other specified postprocedural states: Secondary | ICD-10-CM | POA: Diagnosis not present

## 2017-06-08 DIAGNOSIS — K219 Gastro-esophageal reflux disease without esophagitis: Secondary | ICD-10-CM | POA: Diagnosis not present

## 2017-06-08 DIAGNOSIS — Z800 Family history of malignant neoplasm of digestive organs: Secondary | ICD-10-CM | POA: Diagnosis not present

## 2017-06-08 DIAGNOSIS — Z510 Encounter for antineoplastic radiation therapy: Secondary | ICD-10-CM | POA: Diagnosis not present

## 2017-06-08 DIAGNOSIS — Z807 Family history of other malignant neoplasms of lymphoid, hematopoietic and related tissues: Secondary | ICD-10-CM | POA: Diagnosis not present

## 2017-06-08 MED ORDER — ERGOCALCIFEROL 1.25 MG (50000 UT) PO CAPS: 50000.0000 [IU] | ORAL_CAPSULE | ORAL | 2 refills | Status: DC

## 2017-06-08 NOTE — Therapy (Signed)
[place], [place], [address] Phone: [phone]   Fax:  [phone]  Physical Therapy Treatment  Patient Details  Name: Martha [name] MRN: [id number] Date of Birth: 05-Apr-1951 Referring Provider: Donne Hazel  Encounter Date: 06/08/2017      PT End of Session - 06/08/17 1209    Visit Number 2   Number of Visits 9   Date for PT Re-Evaluation 06/28/17   PT Start Time 0845   PT Stop Time 0930   PT Time Calculation (min) 45 min   Activity Tolerance Patient tolerated treatment well   Behavior During Therapy [facility] for tasks assessed/performed      Past Medical History:  Diagnosis Date  . Arthritis   . 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 Cancer ([place]) 04/2017   right breast cancer  . Colon polyp 2008  . Diabetes mellitus without complication ([place])   . Endometrial hyperplasia 2013  . GERD (gastroesophageal reflux disease)    OCC-NO MEDS  . Headache    H/O MIGRAINES  . Hypertension   . Papilloma 11/20/2016   right breast  . Status post dilation of esophageal narrowing 2008    Past Surgical History:  Procedure Laterality Date  . BREAST BIOPSY Right 11/02/2016   ATYPICAL PAPILLARY LESION,  . BREAST EXCISIONAL BIOPSY Right 11/20/2016   AHD  . BREAST LUMPECTOMY Right 11/20/2016   Procedure: BREAST LUMPECTOMY;  Surgeon: [name], MD;  Location: [facility] ORS;  Service: General;  Laterality: Right;  . COLONOSCOPY  2008   in [place]  . DILATION AND CURETTAGE OF UTERUS    . RADIOACTIVE SEED GUIDED MASTECTOMY WITH AXILLARY SENTINEL LYMPH NODE BIOPSY Right 05/16/2017   Procedure: RIGHT RADIOACTIVE SEED GUIDED LUMPECTOMY  WITH  RIGHT BREAST SEED GUIDED EXCISIONAL BIOPSY AND RIGHT AXILLARY SENTINEL  NODE BIOPSY;  Surgeon: [name], MD;  Location: [facility];  Service: General;  Laterality: Right;  2 SEEDS RIGHT BREAST  GENERAL AND PEC BLOCK ANESHTESIA     There were no vitals filed for this visit.      Subjective Assessment - 06/08/17 0853    Subjective "I'm going good"  She has simulation and will start radiation on Tuesday.  She had no trouble getting arm in and out of radiation. She feels that she has full range of motion.    Currently in Pain? No/denies            [facility] PT Assessment - 06/08/17 0001      AROM   Right Shoulder Flexion 155 Degrees   Right Shoulder ABduction 150 Degrees                     [facility] Adult PT Treatment/Exercise - 06/08/17 0001      Self-Care   Self-Care Other Self-Care Comments   Other Self-Care Comments  briefly reviewed lymphedema risk reduction,( pt will come to class) do not feel pt needs a sleeve as she will not be doing airplane travel., issued handout for [employer] for after treatment   Gave pt information about compression bras and showed her sample of Wearease bra,  Suggested she look at Standley Brooking also       Shoulder Exercises: Standing   Other Standing Exercises reviewed all UE range of motion exercises from [facility] flyer.  Pt able to do them all,  Encouraged core activation in standing.    Other Standing Exercises wall wash and wall stretch  for end range shoulder flexion and abduction                 PT Education - 06/08/17 1207    Education provided Yes   Education Details began lymphedema risk reduction instruction, upgraded shoulder ROM exercises,  gave information about compression bras    Person(s) Educated Patient   Methods Explanation;Demonstration;Handout   Comprehension Verbalized understanding                [name] - 06/08/17 0906      CC Long Term Goal  #1   Title Pt will be able to independently verbalize lymphedema risk reduction practices   Baseline Pt to attend ABC class on June 18,introduction done on 06/08/2017   Status On-going     CC Long Term Goal  #2   Title Pt will demonstrate 165 degrees of right  shoulder flexion to allow her to attain position necessary for radiation   Baseline 130, 155 on 06/08/2017   Status On-going     CC Long Term Goal  #3   Title Pt will demonstrate 165 degrees of right shoulder abduction to allow her to attain position necessary for radiation and reach items out to sides   Baseline 110, 150 on 06/08/2017   Status On-going     CC Long Term Goal  #4   Title Pt will be independent in a hom exercise program for strengthening and stretching   Status On-going     CC Long Term Goal  #5   Title Pt will report a 75% improvement in tightness with overhead motion to allow improved comfort   Status Achieved            Plan - 06/08/17 1209    Clinical Impression Statement Pt has made great improvements in her range of motion and has met those goals.  She says she still has some fullness in her breast and possibly upper arm, but she is only a few weeks out from surgery.  Upgraded range of motion exercise today and instructed pt to get a compression type of bra that has higher sides to compress tissue at posterior axilla and help with the breast fullness she is feeling.  She plans to attend ABC class on Monday and will recheck her swelling at her visit here on Friday    Clinical Impairments Affecting Rehab Potential will begin radiation   PT Frequency 2x / week   PT Duration 4 weeks   PT Treatment/Interventions ADLs/Self Care Home Management;Therapeutic exercise;Patient/family education;Orthotic Fit/Training;Compression bandaging;Manual lymph drainage;Manual techniques;Passive range of motion;Taping   PT Next Visit Plan Remeasure right arm circumference, assess right breast for edema and fit of compression bra.  Discharge when appropriate    Consulted and Agree with Plan of Care Patient      Patient will benefit from skilled therapeutic intervention in order to improve the following deficits and impairments:  Increased fascial restricitons, Pain, Decreased scar  mobility, Impaired UE functional use, Decreased strength, Decreased range of motion, Increased edema  Visit Diagnosis: Stiffness of right shoulder, not elsewhere classified  Acute pain of right shoulder  Muscle weakness (generalized)  Localized edema     Problem List Patient Active Problem List   Diagnosis Date Noted  . Malignant neoplasm of upper-outer quadrant of right breast in female, estrogen receptor positive ([place]) 04/26/2017  . Low back pain 01/29/2017  . Atypical ductal hyperplasia of right breast 01/15/2017  . Allergic rhinitis 04/12/2015  . Diabetes  mellitus type 2, controlled ([place]) 07/24/2013  . Hyperlipidemia 07/24/2013  . Essential hypertension, benign 07/24/2013  . Status post dilation of esophageal narrowing 07/24/2013  . Obesity, unspecified 07/24/2013   Donato Heinz. Owens Shark PT  Norwood Levo 06/08/2017, 12:19 PM  [place], [place], [address] Phone: [phone]   Fax:  [phone]  Name: Martha [name] MRN: [id number] Date of Birth: January 09, 1951

## 2017-06-08 NOTE — Patient Instructions (Signed)
First of all, check with your insurance company to see if provider is in network   [facility]                                            [address]  [place], [place] [address] [phone]    Does not file for insurance--- call for appointment with Cathy  [facility]   (for wigs and compression sleeves / gloves/gauntlets )  [address] [place], [place] [address] [phone]  Will file some insurances --- call for appointment   [facility] (for mastectomy prosthetics and garments) [address] [place], [place] [address] [phone] Will file some insurances --- call for appointment  [facility]  [address]  [place], [place] [address] [phone] Lower extremity garments  [facility] [address] [place], [place]  [address] [phone]  [employer] Sales rep:  Matt Lawson:  [phone] [email or web address] Biocompression pumps   [employer]  Sales rep: Robert Rollins:  [phone] [email or web address] Entre and Flexitouch pumps    Other Resources: National Lymphedema Network:  www.lymphnet.org www.[email or web address].com for patient articles and purchase a self manual lymph drainage DVD [email or web address] has informative articles.  

## 2017-06-08 NOTE — Progress Notes (Signed)
DRISDO

## 2017-06-11 ENCOUNTER — Encounter: Payer: Medicare Other | Admitting: Physical Therapy

## 2017-06-14 ENCOUNTER — Encounter: Payer: Self-pay | Admitting: Internal Medicine

## 2017-06-14 ENCOUNTER — Ambulatory Visit (INDEPENDENT_AMBULATORY_CARE_PROVIDER_SITE_OTHER): Payer: Medicare Other | Admitting: Internal Medicine

## 2017-06-14 ENCOUNTER — Telehealth: Payer: Self-pay | Admitting: *Deleted

## 2017-06-14 ENCOUNTER — Ambulatory Visit
Admission: RE | Admit: 2017-06-14 | Discharge: 2017-06-14 | Disposition: A | Discharge status: Home / Self Care | Payer: Medicare Other | Source: Ambulatory Visit | Attending: Radiation Oncology | Admitting: Radiation Oncology

## 2017-06-14 DIAGNOSIS — E119 Type 2 diabetes mellitus without complications: Secondary | ICD-10-CM

## 2017-06-14 DIAGNOSIS — R21 Rash and other nonspecific skin eruption: Secondary | ICD-10-CM | POA: Diagnosis not present

## 2017-06-14 DIAGNOSIS — Z7984 Long term (current) use of oral hypoglycemic drugs: Secondary | ICD-10-CM | POA: Diagnosis not present

## 2017-06-14 DIAGNOSIS — C50411 Malignant neoplasm of upper-outer quadrant of right female breast: Secondary | ICD-10-CM | POA: Diagnosis not present

## 2017-06-14 DIAGNOSIS — Z800 Family history of malignant neoplasm of digestive organs: Secondary | ICD-10-CM | POA: Diagnosis not present

## 2017-06-14 DIAGNOSIS — Z9889 Other specified postprocedural states: Secondary | ICD-10-CM | POA: Diagnosis not present

## 2017-06-14 DIAGNOSIS — Z79899 Other long term (current) drug therapy: Secondary | ICD-10-CM | POA: Diagnosis not present

## 2017-06-14 DIAGNOSIS — I10 Essential (primary) hypertension: Secondary | ICD-10-CM | POA: Diagnosis not present

## 2017-06-14 DIAGNOSIS — Z8249 Family history of ischemic heart disease and other diseases of the circulatory system: Secondary | ICD-10-CM | POA: Diagnosis not present

## 2017-06-14 DIAGNOSIS — Z807 Family history of other malignant neoplasms of lymphoid, hematopoietic and related tissues: Secondary | ICD-10-CM | POA: Diagnosis not present

## 2017-06-14 DIAGNOSIS — Z510 Encounter for antineoplastic radiation therapy: Secondary | ICD-10-CM | POA: Diagnosis not present

## 2017-06-14 DIAGNOSIS — Z170 Estrogen receptor positive status [ER+]: Secondary | ICD-10-CM | POA: Diagnosis not present

## 2017-06-14 DIAGNOSIS — K219 Gastro-esophageal reflux disease without esophagitis: Secondary | ICD-10-CM | POA: Diagnosis not present

## 2017-06-14 DIAGNOSIS — Z7982 Long term (current) use of aspirin: Secondary | ICD-10-CM | POA: Diagnosis not present

## 2017-06-14 DIAGNOSIS — E785 Hyperlipidemia, unspecified: Secondary | ICD-10-CM

## 2017-06-14 DIAGNOSIS — Z803 Family history of malignant neoplasm of breast: Secondary | ICD-10-CM | POA: Diagnosis not present

## 2017-06-14 DIAGNOSIS — E7800 Pure hypercholesterolemia, unspecified: Secondary | ICD-10-CM

## 2017-06-14 MED ORDER — TRIAMCINOLONE ACETONIDE 0.1 % EX CREA: TOPICAL_CREAM | Freq: Two times a day (BID) | CUTANEOUS | 1 refills | Status: DC

## 2017-06-14 NOTE — Telephone Encounter (Signed)
FYI Patient requested lab orders prior to her  09/19/17 appt.

## 2017-06-14 NOTE — Progress Notes (Signed)
Subjective:  Patient ID: Martha [name], female    DOB: [birth year]  Age: 66 y.o. MRN: [id number]  CC: Diagnoses of Controlled type 2 diabetes mellitus without complication, without long-term current use of insulin ([place]), Essential hypertension, benign, Pure hypercholesterolemia, Malignant neoplasm of upper-outer quadrant of right breast in female, estrogen receptor positive ([place]), and Localized maculopapular rash were pertinent to this visit.  HPI Martha [name] presents for follow up on multiple issues. Including hypertension, hyperlipidmiea and diabetes type 2  Diagnosed with breast cancer right breast April 2018.  Found in 2017 but patient deferred anti estrogen therapy.   Saw new suegeon May 23 who recommended lumpectomy /excisional biopsy with sentinel lymph node   On May 23.   Starts XRT  New onset insomnia.  Since dx of cancer.  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   Cc:  Morning rash gets worse as day progresses, itchy.  Both forearms. Steroid creams made it worse,  treid hydrocortisone and triamcinolone    Outpatient Medications Prior to Visit  Medication Sig Dispense Refill  . aspirin 81 MG tablet Take 81 mg by mouth daily.    . Cholecalciferol (VITAMIN D-3 PO) Take 2,000 Int'l Units by mouth daily.    . fluticasone (FLONASE) 50 MCG/ACT nasal spray Place 2 sprays into both nostrils daily. (Patient taking differently: Place 2 sprays into both nostrils daily as needed. ) 16 g 2  . lisinopril-hydrochlorothiazide (PRINZIDE,ZESTORETIC) 20-12.5 MG tablet Take 1 tablet by mouth daily. 90 tablet 3  . metFORMIN (GLUCOPHAGE) 500 MG tablet Take 1 tablet (500 mg total) by mouth 2 (two) times daily with a meal. 180 tablet 0  . simvastatin (ZOCOR) 40 MG tablet Take 1 tablet (40 mg total) by mouth daily. 90 tablet 0  . TRIAMCINOLONE ACETONIDE, TOP, 0.05 % OINT Apply 1 application topically as needed.     . cyclobenzaprine (FLEXERIL) 10 MG tablet Take 1 tablet (10 mg total) by mouth 3  (three) times daily as needed for muscle spasms. (Patient not taking: Reported on 06/14/2017) 30 tablet 0  . ergocalciferol (DRISDOL) 50000 units capsule Take 1 capsule (50,000 Units total) by mouth once a week. (Patient not taking: Reported on 06/14/2017) 4 capsule 2   No facility-administered medications prior to visit.     Review of Systems;  Patient denies headache, fevers, malaise, unintentional weight loss, skin rash, eye pain, sinus congestion and sinus pain, sore throat, dysphagia,  hemoptysis , cough, dyspnea, wheezing, chest pain, palpitations, orthopnea, edema, abdominal pain, nausea, melena, diarrhea, constipation, flank pain, dysuria, hematuria, urinary  Frequency, nocturia, numbness, tingling, seizures,  Focal weakness, Loss of consciousness,  Tremor, insomnia, depression, anxiety, and suicidal ideation.      Objective:  BP 108/68 (BP Location: Left Arm, Patient Position: Sitting, Cuff Size: Large)   Pulse 74   Temp 98.3 F (36.8 C) (Oral)   Resp 16   Ht 5\' 2"  (1.575 m)   Wt 200 lb 6.4 oz (90.9 kg)   SpO2 98%   BMI 36.65 kg/m   BP Readings from Last 3 Encounters:  06/14/17 108/68  05/29/17 115/79  05/16/17 (!) 150/74    Wt Readings from Last 3 Encounters:  06/14/17 200 lb 6.4 oz (90.9 kg)  05/29/17 202 lb 12.8 oz (92 kg)  05/16/17 200 lb 12.8 oz (91.1 kg)    General appearance: alert, cooperative and appears stated age Ears: normal TM's and external ear canals both ears Throat: lips, mucosa, and tongue normal; teeth and gums normal  Neck: no adenopathy, no carotid bruit, supple, symmetrical, trachea midline and thyroid not enlarged, symmetric, no tenderness/mass/nodules Back: symmetric, no curvature. ROM normal. No CVA tenderness. Lungs: clear to auscultation bilaterally Heart: regular rate and rhythm, S1, S2 normal, no murmur, click, rub or gallop Abdomen: soft, non-tender; bowel sounds normal; no masses,  no organomegaly Pulses: 2+ and symmetric Skin: Skin  color, texture, turgor normal. No rashes or lesions Lymph nodes: Cervical, supraclavicular, and axillary nodes normal.  Lab Results  Component Value Date   HGBA1C 6.6 (H) 06/07/2017   HGBA1C 6.5 10/24/2016   HGBA1C 6.6 (H) 07/20/2016    Lab Results  Component Value Date   CREATININE 1.03 06/07/2017   CREATININE 1.07 (H) 05/14/2017   CREATININE 1.10 (H) 04/28/2017    Lab Results  Component Value Date   WBC 8.8 07/20/2016   HGB 12.1 07/20/2016   HCT 36.9 07/20/2016   PLT 323.0 07/20/2016   GLUCOSE 114 (H) 06/07/2017   CHOL 160 06/07/2017   TRIG 82.0 06/07/2017   HDL 52.80 06/07/2017   LDLDIRECT 83.0 06/07/2017   LDLCALC 91 06/07/2017   ALT 6 06/07/2017   AST 11 06/07/2017   NA 140 06/07/2017   K 4.4 06/07/2017   CL 104 06/07/2017   CREATININE 1.03 06/07/2017   BUN 18 06/07/2017   CO2 30 06/07/2017   TSH 1.20 06/07/2017   HGBA1C 6.6 (H) 06/07/2017   MICROALBUR 2.8 (H) 02/21/2016    No results found.  Assessment & Plan:   Problem List Items Addressed This Visit    Malignant neoplasm of upper-outer quadrant of right breast in female, estrogen receptor positive ([place])     S/p lumpectomy /excisional biopsy with sentinel lymph node   On May 23.   Starts XRT       Localized maculopapular rash    Recurrent ,with hives,  Occurring On both forearms.  Trial of antihistamine and topical triamcinolone      Hyperlipidemia    Well controlled. Continue simvastatin.  Lab Results  Component Value Date   CHOL 160 06/07/2017   HDL 52.80 06/07/2017   LDLCALC 91 06/07/2017   LDLDIRECT 83.0 06/07/2017   TRIG 82.0 06/07/2017   CHOLHDL 3 06/07/2017   Lab Results  Component Value Date   ALT 6 06/07/2017   AST 11 06/07/2017   ALKPHOS 107 06/07/2017   BILITOT 0.3 06/07/2017         Essential hypertension, benign    Well controlled on current regimen. Renal function stable, no changes today.  Lab Results  Component Value Date   CREATININE 1.03 06/07/2017   Lab  Results  Component Value Date   NA 140 06/07/2017   K 4.4 06/07/2017   CL 104 06/07/2017   CO2 30 06/07/2017         Diabetes mellitus type 2, controlled ([place])    Currently well-controlled on current medications .  hemoglobin A1c is at goal of less than 7.0 . Patient is reminded to schedule an annual eye exam and foot exam is normal today. Patient has no microalbuminuria. Patient is tolerating statin therapy for CAD risk reduction and on ACE/ARB for renal protection and hypertension  Lab Results  Component Value Date   HGBA1C 6.6 (H) 06/07/2017   Lab Results  Component Value Date   MICROALBUR 2.8 (H) 02/21/2016            A total of 25 minutes of face to face time was spent with patient more than half of which  was spent in counselling about the above mentioned conditions  and coordination of care  I have discontinued Ms. Rappaport's cyclobenzaprine and ergocalciferol. I have also changed her TRIAMCINOLONE ACETONIDE (TOP) to triamcinolone cream. Additionally, I am having her maintain her aspirin, Cholecalciferol (VITAMIN D-3 PO), fluticasone, lisinopril-hydrochlorothiazide, metFORMIN, and simvastatin.  Meds ordered this encounter  Medications  . triamcinolone cream (KENALOG) 0.1 %    Sig: Apply topically 2 (two) times daily. For itchy rash    Dispense:  30 g    Refill:  1    Medications Discontinued During This Encounter  Medication Reason  . cyclobenzaprine (FLEXERIL) 10 MG tablet Patient has not taken in last 30 days  . ergocalciferol (DRISDOL) 50000 units capsule Therapy completed  . TRIAMCINOLONE ACETONIDE, TOP, 0.05 % OINT Reorder    Follow-up: Return in about 3 months (around 09/14/2017) for follow up diabetes.   [name], MD

## 2017-06-14 NOTE — Patient Instructions (Addendum)
1)) For your itchy rash ,  You can use Benadryl  At night but you should also consider adding one of these newer second generation antihistamines that are longer acting, non sedating and  available OTC:  Generic  Zyrtec, which is cetirizine.    generic Allegra , available generically as fexofenadine ; comes in 60 mg and 180 mg once daily strengths.    Generic Claritin :  also available as loratidine .   You can also lso use the triamcinolone oimntent twice daily on rash as needed    If you want to lower your A1c even more :   Try some of the lower carb ideas on my  "Low GI"  Diet:   All of the foods can be found at grocery stores and in bulk at [employer].  The Atkins protein bars and shakes are available in more varieties at Target, [employer] and [place].     7 AM Breakfast:  Choose from the following:  Low carbohydrate Protein  Shakes (I recommend the  Premier Protein chocolate shakes,  EAS AdvantEdge "Carb Control" shakes  Or the Atkins shakes all are under 3 net carbs)     a scrambled egg/bacon/cheese burrito made with Mission's "carb balance" whole wheat tortilla  (about 10 net carbs )  Regulatory affairs officer (basically a quiche without the pastry crust) that is eaten cold and very convenient way to get your eggs.  8 carbs)  If you make your own protein shakes, avoid bananas and pineapple,  And use low carb greek yogurt or original /unsweetened almond or soy milk    Avoid cereal and bananas, oatmeal and cream of wheat and grits. They are loaded with carbohydrates!   10 AM: high protein snack:  Protein bar by Atkins (the snack size, under 200 cal, usually < 6 net carbs).    A stick of cheese:  Around 1 carb,  100 cal     Dannon Light n Fit [place] Yogurt  (80 cal, 8 carbs)  Other so called "protein bars" and Greek yogurts tend to be loaded with carbohydrates.  Remember, in food advertising, the word "energy" is synonymous for " carbohydrate."  Lunch:   A  Sandwich using the bread choices listed, Can use any  Eggs,  lunchmeat, grilled meat or canned tuna), avocado, regular mayo/mustard  and cheese.  A Salad using blue cheese, ranch,  Goddess or vinagrette,  Avoid taco shells, croutons or "confetti" and no "candied nuts" but regular nuts OK.   No pretzels, nabs  or chips.  Pickles and miniature sweet peppers are a good low carb alternative that provide a "crunch"  The bread is the only source of carbohydrate in a sandwich and  can be decreased by trying some of the attached alternatives to traditional loaf bread   Avoid "Low fat dressings, as well as [place] dressings They are loaded with sugar!   3 PM/ Mid day  Snack:  Consider  1 ounce of  almonds, walnuts, pistachios, pecans, peanuts,  Macadamia nuts or a nut medley.  Avoid "granola and granola bars "  Mixed nuts are ok in moderation as long as there are no raisins,  cranberries or dried fruit.   KIND bars are OK if you get the low glycemic index variety   Try the prosciutto/mozzarella cheese sticks by [employer]  In deli /backery section   High protein      6 PM  Dinner:  Meat/fowl/fish with a green salad, and either broccoli, cauliflower, green beans, spinach, brussel sprouts or  Lima beans. DO NOT BREAD THE PROTEIN!!      There is a low carb pasta by Dreamfield's that is acceptable and tastes great: only 5 digestible carbs/serving.( All grocery stores but BJs carry it ) Several ready made meals are available low carb:   Try Michel Angelo's chicken piccata or chicken or eggplant parm over low carb pasta.(Lowes and BJs)   Marjory Lies Sanchez's "Carnitas" (pulled pork, no sauce,  0 carbs) or his beef pot roast to make a dinner burrito (at [employer]J's)  Pesto over low carb pasta (bj's sells a good quality pesto in the center refrigerated section of the deli   Try satueeing  Cheral Marker with mushroooms as a good side   [employer] Giant makes a mashed cauliflower that tastes like mashed  potatoes  Whole wheat pasta is still full of digestible carbs and  Not as low in glycemic index as Dreamfield's.   Brown rice is still rice,  So skip the rice and noodles if you eat [place] or [place] (or at least limit to 1/2 cup)  9 PM snack :   Breyer's "low carb" fudgsicle or  ice cream bar (Carb Smart line), or  Weight Watcher's ice cream bar , or another "no sugar added" ice cream;  a serving of fresh berries/cherries with whipped cream   Cheese or DANNON'S LlGHT N FIT GREEK YOGURT  8 ounces of [place] unsweetened almond/cococunut milk    Treat yourself to a parfait made with whipped cream blueberiies, walnuts and vanilla greek yogurt  Avoid bananas, pineapple, grapes  and watermelon on a regular basis because they are high in sugar.  THINK OF THEM AS DESSERT  Remember that snack Substitutions should be less than 10 NET carbs per serving and meals < 20 carbs. Remember to subtract fiber grams to get the "net carbs."

## 2017-06-15 ENCOUNTER — Encounter: Payer: Self-pay | Admitting: Physical Therapy

## 2017-06-15 ENCOUNTER — Ambulatory Visit: Payer: Medicare Other | Admitting: Physical Therapy

## 2017-06-15 DIAGNOSIS — C50911 Malignant neoplasm of unspecified site of right female breast: Secondary | ICD-10-CM | POA: Diagnosis not present

## 2017-06-15 DIAGNOSIS — M6281 Muscle weakness (generalized): Secondary | ICD-10-CM

## 2017-06-15 DIAGNOSIS — M25611 Stiffness of right shoulder, not elsewhere classified: Secondary | ICD-10-CM

## 2017-06-15 DIAGNOSIS — M25511 Pain in right shoulder: Secondary | ICD-10-CM

## 2017-06-15 DIAGNOSIS — R600 Localized edema: Secondary | ICD-10-CM | POA: Diagnosis not present

## 2017-06-15 NOTE — Patient Instructions (Signed)

## 2017-06-15 NOTE — Therapy (Signed)
[place], [place], [address] Phone: [phone]   Fax:  [phone]  Physical Therapy Treatment  Patient Details  Name: Martha [name] MRN: [id number] Date of Birth: 1951-08-17 Referring Provider: Donne Hazel  Encounter Date: 06/15/2017      PT End of Session - 06/15/17 1103    Visit Number 3   Number of Visits 9   Date for PT Re-Evaluation 06/28/17   PT Start Time 1022   PT Stop Time 1101   PT Time Calculation (min) 39 min   Activity Tolerance Patient tolerated treatment well   Behavior During Therapy [facility] for tasks assessed/performed      Past Medical History:  Diagnosis Date  . Arthritis   . 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 Cancer ([place]) 04/2017   right breast cancer  . Colon polyp 2008  . Diabetes mellitus without complication ([place])   . Endometrial hyperplasia 2013  . GERD (gastroesophageal reflux disease)    OCC-NO MEDS  . Headache    H/O MIGRAINES  . Hypertension   . Papilloma 11/20/2016   right breast  . Status post dilation of esophageal narrowing 2008    Past Surgical History:  Procedure Laterality Date  . BREAST BIOPSY Right 11/02/2016   ATYPICAL PAPILLARY LESION,  . BREAST EXCISIONAL BIOPSY Right 11/20/2016   AHD  . BREAST LUMPECTOMY Right 11/20/2016   Procedure: BREAST LUMPECTOMY;  Surgeon: [name], MD;  Location: [facility];  Service: General;  Laterality: Right;  . COLONOSCOPY  2008   in [place]  . DILATION AND CURETTAGE OF UTERUS    . RADIOACTIVE SEED GUIDED MASTECTOMY WITH AXILLARY SENTINEL LYMPH NODE BIOPSY Right 05/16/2017   Procedure: RIGHT RADIOACTIVE SEED GUIDED LUMPECTOMY  WITH  RIGHT BREAST SEED GUIDED EXCISIONAL BIOPSY AND RIGHT AXILLARY SENTINEL  NODE BIOPSY;  Surgeon: [name], MD;  Location: [facility];  Service: General;  Laterality: Right;  2 SEEDS RIGHT BREAST  GENERAL AND PEC BLOCK ANESHTESIA     There were no vitals filed for this visit.      Subjective Assessment - 06/15/17 1024    Subjective Everything is pretty good today. I am not having any swelling.    Pertinent History prior history of atypical ductal hyperplasia of the right breast who presented with a mammographic abnormality that led to ultrasound and a stereotactic biopsy on 04/20/2017. 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 HER-2 is pending, lumpectomy 05/16/17 with SLNB   Patient Stated Goals to get full range of motion   Currently in Pain? No/denies   Pain Score 0-No pain               LYMPHEDEMA/ONCOLOGY QUESTIONNAIRE - 06/15/17 1027      Right Upper Extremity Lymphedema   15 cm Proximal to Olecranon Process 37 cm   Olecranon Process 27.9 cm   15 cm Proximal to Ulnar Styloid Process 26 cm   Just Proximal to Ulnar Styloid Process 17.5 cm   Across Hand at [employer] 20 cm   At [place] of 2nd Digit 7.1 cm                  [facility] Adult PT Treatment/Exercise - 06/15/17 0001      Shoulder Exercises: Supine   Horizontal ABduction Strengthening;Both;10 reps;Theraband   Theraband Level (Shoulder Horizontal ABduction) Level 2 (Red)   External Rotation Strengthening;Both;10 reps;Theraband   Theraband Level (  Shoulder External Rotation) Level 2 (Red)   Flexion Strengthening;Both;10 reps  narrow and wide grip   Theraband Level (Shoulder Flexion) Level 2 (Red)   Other Supine Exercises D2 x 10 reps bilaterally with red theraband     Shoulder Exercises: Standing   Other Standing Exercises Strength After Breast Cancer program: scaption with 2lb x 10 reps, briding x 10, back stretch x 30 sec holds, pelvic tilts with marching x  5 reps bilaterally, chest press x 10 with 2 lb, bicep curls x 10 with 2 lb,                         Long Term Clinic Goals - 06/15/17 1025      CC Long Term Goal  #1   Title Pt will be able to independently  verbalize lymphedema risk reduction practices   Baseline Pt to attend ABC class on June 18,introduction done on 06/08/2017, 06/15/17- pt attended this class   Time 4   Period Weeks   Status Achieved     CC Long Term Goal  #2   Title Pt will demonstrate 165 degrees of right shoulder flexion to allow her to attain position necessary for radiation   Baseline 130, 155 on 06/08/2017, 06/15/17- 165 degrees   Time 4   Period Weeks   Status Achieved     CC Long Term Goal  #3   Title Pt will demonstrate 165 degrees of right shoulder abduction to allow her to attain position necessary for radiation and reach items out to sides   Baseline 110, 150 on 06/08/2017, 06/15/17- 168 degrees   Time 4   Period Weeks   Status Achieved     CC Long Term Goal  #4   Title Pt will be independent in a hom exercise program for strengthening and stretching   Time 4   Status On-going     CC Long Term Goal  #5   Title Pt will report a 75% improvement in tightness with overhead motion to allow improved comfort   Time 4   Period Weeks   Status Achieved            Plan - 06/15/17 1048    Clinical Impression Statement Patient has met all of her range of motion goals for therapy. She states she will be getting her compression bra next week. She would benefit from progression of her home exercise program. Added supine scap series today and began strength after breast cancer program today. Will progress patient towards independence with this so she can continue these exercises at home while she undergoes radiation. She would also benefit from a prophylactic compression sleeve for gardening and plans on calling her insurance company to check for coverage. Remeasured circumference of R upper extremity with no notable differences between right and left.    Rehab Potential Good   Clinical Impairments Affecting Rehab Potential will begin radiation   PT Frequency 2x / week   PT Duration 4 weeks   PT Treatment/Interventions  ADLs/Self Care Home Management;Therapeutic exercise;Patient/family education;Orthotic Fit/Training;Compression bandaging;Manual lymph drainage;Manual techniques;Passive range of motion;Taping   PT Next Visit Plan assess right breast for edema and fit of compression bra, continue instruction in strength after breast cancer program and assess indep with supine scap exercises   PT Home Exercise Plan post op breast exercises, supine dowel exercises, supine scap   Consulted and Agree with Plan of Care Patient      Patient will  benefit from skilled therapeutic intervention in order to improve the following deficits and impairments:  Increased fascial restricitons, Pain, Decreased scar mobility, Impaired UE functional use, Decreased strength, Decreased range of motion, Increased edema  Visit Diagnosis: Stiffness of right shoulder, not elsewhere classified  Acute pain of right shoulder  Muscle weakness (generalized)     Problem List Patient Active Problem List   Diagnosis Date Noted  . Malignant neoplasm of upper-outer quadrant of right breast in female, estrogen receptor positive ([place]) 04/26/2017  . Low back pain 01/29/2017  . Atypical ductal hyperplasia of right breast 01/15/2017  . Allergic rhinitis 04/12/2015  . Diabetes mellitus type 2, controlled ([place]) 07/24/2013  . Hyperlipidemia 07/24/2013  . Essential hypertension, benign 07/24/2013  . Status post dilation of esophageal narrowing 07/24/2013  . Obesity, unspecified 07/24/2013    Allyson Sabal [facility] 06/15/2017, 11:04 AM  [place], [place], [address] Phone: [phone]   Fax:  [phone]  Name: Martha [name] MRN: [id number] Date of Birth: Jul 19, 1951  Manus Gunning, PT 06/15/17 11:04 AM

## 2017-06-15 NOTE — Telephone Encounter (Signed)
Labs have been ordered

## 2017-06-16 DIAGNOSIS — R21 Rash and other nonspecific skin eruption: Secondary | ICD-10-CM | POA: Insufficient documentation

## 2017-06-16 NOTE — Assessment & Plan Note (Signed)
Recurrent ,with hives,  Occurring On both forearms.  Trial of antihistamine and topical triamcinolone

## 2017-06-16 NOTE — Assessment & Plan Note (Signed)
Well controlled. Continue simvastatin.  Lab Results  Component Value Date   CHOL 160 06/07/2017   HDL 52.80 06/07/2017   LDLCALC 91 06/07/2017   LDLDIRECT 83.0 06/07/2017   TRIG 82.0 06/07/2017   CHOLHDL 3 06/07/2017   Lab Results  Component Value Date   ALT 6 06/07/2017   AST 11 06/07/2017   ALKPHOS 107 06/07/2017   BILITOT 0.3 06/07/2017

## 2017-06-16 NOTE — Assessment & Plan Note (Addendum)
S/p lumpectomy /excisional biopsy with sentinel lymph node   On May 23.   Starts XRT

## 2017-06-16 NOTE — Assessment & Plan Note (Signed)
Currently well-controlled on current medications .  hemoglobin A1c is at goal of less than 7.0 . Patient is reminded to schedule an annual eye exam and foot exam is normal today. Patient has no microalbuminuria. Patient is tolerating statin therapy for CAD risk reduction and on ACE/ARB for renal protection and hypertension  Lab Results  Component Value Date   HGBA1C 6.6 (H) 06/07/2017   Lab Results  Component Value Date   MICROALBUR 2.8 (H) 02/21/2016

## 2017-06-16 NOTE — Assessment & Plan Note (Signed)
Well controlled on current regimen. Renal function stable, no changes today.  Lab Results  Component Value Date   CREATININE 1.03 06/07/2017   Lab Results  Component Value Date   NA 140 06/07/2017   K 4.4 06/07/2017   CL 104 06/07/2017   CO2 30 06/07/2017

## 2017-06-18 ENCOUNTER — Ambulatory Visit: Payer: Medicare Other | Admitting: Physical Therapy

## 2017-06-18 ENCOUNTER — Encounter: Payer: Self-pay | Admitting: Physical Therapy

## 2017-06-18 ENCOUNTER — Ambulatory Visit
Admission: RE | Admit: 2017-06-18 | Discharge: 2017-06-18 | Disposition: A | Discharge status: Home / Self Care | Payer: Medicare Other | Source: Ambulatory Visit | Attending: Radiation Oncology | Admitting: Radiation Oncology

## 2017-06-18 DIAGNOSIS — M25611 Stiffness of right shoulder, not elsewhere classified: Secondary | ICD-10-CM | POA: Diagnosis not present

## 2017-06-18 DIAGNOSIS — Z7984 Long term (current) use of oral hypoglycemic drugs: Secondary | ICD-10-CM | POA: Diagnosis not present

## 2017-06-18 DIAGNOSIS — R600 Localized edema: Secondary | ICD-10-CM | POA: Diagnosis not present

## 2017-06-18 DIAGNOSIS — Z807 Family history of other malignant neoplasms of lymphoid, hematopoietic and related tissues: Secondary | ICD-10-CM | POA: Diagnosis not present

## 2017-06-18 DIAGNOSIS — Z8249 Family history of ischemic heart disease and other diseases of the circulatory system: Secondary | ICD-10-CM | POA: Diagnosis not present

## 2017-06-18 DIAGNOSIS — Z510 Encounter for antineoplastic radiation therapy: Secondary | ICD-10-CM | POA: Diagnosis not present

## 2017-06-18 DIAGNOSIS — Z7982 Long term (current) use of aspirin: Secondary | ICD-10-CM | POA: Diagnosis not present

## 2017-06-18 DIAGNOSIS — Z803 Family history of malignant neoplasm of breast: Secondary | ICD-10-CM | POA: Diagnosis not present

## 2017-06-18 DIAGNOSIS — Z79899 Other long term (current) drug therapy: Secondary | ICD-10-CM | POA: Diagnosis not present

## 2017-06-18 DIAGNOSIS — I10 Essential (primary) hypertension: Secondary | ICD-10-CM | POA: Diagnosis not present

## 2017-06-18 DIAGNOSIS — M6281 Muscle weakness (generalized): Secondary | ICD-10-CM

## 2017-06-18 DIAGNOSIS — Z800 Family history of malignant neoplasm of digestive organs: Secondary | ICD-10-CM | POA: Diagnosis not present

## 2017-06-18 DIAGNOSIS — Z170 Estrogen receptor positive status [ER+]: Secondary | ICD-10-CM | POA: Diagnosis not present

## 2017-06-18 DIAGNOSIS — M25511 Pain in right shoulder: Secondary | ICD-10-CM

## 2017-06-18 DIAGNOSIS — Z9889 Other specified postprocedural states: Secondary | ICD-10-CM | POA: Diagnosis not present

## 2017-06-18 DIAGNOSIS — E119 Type 2 diabetes mellitus without complications: Secondary | ICD-10-CM | POA: Diagnosis not present

## 2017-06-18 DIAGNOSIS — K219 Gastro-esophageal reflux disease without esophagitis: Secondary | ICD-10-CM | POA: Diagnosis not present

## 2017-06-18 DIAGNOSIS — C50411 Malignant neoplasm of upper-outer quadrant of right female breast: Secondary | ICD-10-CM | POA: Diagnosis not present

## 2017-06-18 NOTE — Therapy (Signed)
[place], [place], [address] Phone: [phone]   Fax:  [phone]  Physical Therapy Treatment  Patient Details  Name: Martha [name] MRN: [id number] Date of Birth: September 02, 1951 Referring Provider: Donne Hazel  Encounter Date: 06/18/2017      PT End of Session - 06/18/17 1148    Visit Number 4   Number of Visits 9   Date for PT Re-Evaluation 06/28/17   PT Start Time 1105   PT Stop Time 1149   PT Time Calculation (min) 44 min   Activity Tolerance Patient tolerated treatment well   Behavior During Therapy [facility] for tasks assessed/performed      Past Medical History:  Diagnosis Date  . Arthritis   . 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 Cancer ([place]) 04/2017   right breast cancer  . Colon polyp 2008  . Diabetes mellitus without complication ([place])   . Endometrial hyperplasia 2013  . GERD (gastroesophageal reflux disease)    OCC-NO MEDS  . Headache    H/O MIGRAINES  . Hypertension   . Papilloma 11/20/2016   right breast  . Status post dilation of esophageal narrowing 2008    Past Surgical History:  Procedure Laterality Date  . BREAST BIOPSY Right 11/02/2016   ATYPICAL PAPILLARY LESION,  . BREAST EXCISIONAL BIOPSY Right 11/20/2016   AHD  . BREAST LUMPECTOMY Right 11/20/2016   Procedure: BREAST LUMPECTOMY;  Surgeon: [name], MD;  Location: [facility] OR[facility];  Service: General;  Laterality: Right;  . COLONOSCOPY  2008   in [place]  . DILATION AND CURETTAGE OF UTERUS    . RADIOACTIVE SEED GUIDED MASTECTOMY WITH AXILLARY SENTINEL LYMPH NODE BIOPSY Right 05/16/2017   Procedure: RIGHT RADIOACTIVE SEED GUIDED LUMPECTOMY  WITH  RIGHT BREAST SEED GUIDED EXCISIONAL BIOPSY AND RIGHT AXILLARY SENTINEL  NODE BIOPSY;  Surgeon: [name], MD;  Location: [facility];  Service: General;  Laterality: Right;  2 SEEDS RIGHT BREAST  GENERAL AND PEC BLOCK ANESHTESIA     There were no vitals filed for this visit.      Subjective Assessment - 06/18/17 1107    Subjective I have been doing the band exercises. I could feel my muscles had been working when I tried to get out of a chair. I could feel it in my core.    Pertinent History prior history of atypical ductal hyperplasia of the right breast who presented with a mammographic abnormality that led to ultrasound and a stereotactic biopsy on 04/20/2017. 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 HER-2 is pending, lumpectomy 05/16/17 with SLNB   Patient Stated Goals to get full range of motion   Currently in Pain? No/denies   Pain Score 0-No pain                         [facility] Adult PT Treatment/Exercise - 06/18/17 0001      Shoulder Exercises: Supine   Horizontal ABduction Strengthening;Both;10 reps;Theraband   Theraband Level (Shoulder Horizontal ABduction) Level 2 (Red)   External Rotation Strengthening;Both;10 reps;Theraband   Theraband Level (Shoulder External Rotation) Level 2 (Red)   Flexion Strengthening;Both;10 reps  narrow and wide grip   Theraband Level (Shoulder Flexion) Level 2 (Red)   Other Supine Exercises D2 x 10 reps bilaterally with red theraband     Shoulder Exercises: Standing   Other Standing Exercises Strength After Breast Cancer program: x 10  reps for all strengthening exercises using 2 lb weights, 30 sec holds on stretches                        [place] - 06/15/17 1025      CC Long Term Goal  #1   Title Pt will be able to independently verbalize lymphedema risk reduction practices   Baseline Pt to attend ABC class on June 18,introduction done on 06/08/2017, 06/15/17- pt attended this class   Time 4   Period Weeks   Status Achieved     CC Long Term Goal  #2   Title Pt will demonstrate 165 degrees of right shoulder flexion to allow her to attain position necessary for radiation   Baseline  130, 155 on 06/08/2017, 06/15/17- 165 degrees   Time 4   Period Weeks   Status Achieved     CC Long Term Goal  #3   Title Pt will demonstrate 165 degrees of right shoulder abduction to allow her to attain position necessary for radiation and reach items out to sides   Baseline 110, 150 on 06/08/2017, 06/15/17- 168 degrees   Time 4   Period Weeks   Status Achieved     CC Long Term Goal  #4   Title Pt will be independent in a hom exercise program for strengthening and stretching   Time 4   Status On-going     CC Long Term Goal  #5   Title Pt will report a 75% improvement in tightness with overhead motion to allow improved comfort   Time 4   Period Weeks   Status Achieved            Plan - 06/18/17 1121    Clinical Impression Statement I got my compression bra. I was having some swelling in my breast but since I have been wearing the compression bra, the swelling went away. Continued instruction in Strength After Breast Cancer Program and added this to pt's home exercise program.    Rehab Potential Good   Clinical Impairments Affecting Rehab Potential will begin radiation   PT Frequency 2x / week   PT Duration 4 weeks   PT Treatment/Interventions ADLs/Self Care Home Management;Therapeutic exercise;Patient/family education;Orthotic Fit/Training;Compression bandaging;Manual lymph drainage;Manual techniques;Passive range of motion;Taping   PT Next Visit Plan d/c, assess indep with ABC program   PT Home Exercise Plan post op breast exercises, supine dowel exercises, supine scap   Consulted and Agree with Plan of Care Patient      Patient will benefit from skilled therapeutic intervention in order to improve the following deficits and impairments:  Increased fascial restricitons, Pain, Decreased scar mobility, Impaired UE functional use, Decreased strength, Decreased range of motion, Increased edema  Visit Diagnosis: Stiffness of right shoulder, not elsewhere classified  Acute pain  of right shoulder  Muscle weakness (generalized)     Problem List Patient Active Problem List   Diagnosis Date Noted  . Localized maculopapular rash 06/16/2017  . Malignant neoplasm of upper-outer quadrant of right breast in female, estrogen receptor positive ([place]) 04/26/2017  . Low back pain 01/29/2017  . Atypical ductal hyperplasia of right breast 01/15/2017  . Allergic rhinitis 04/12/2015  . Diabetes mellitus type 2, controlled ([place]) 07/24/2013  . Hyperlipidemia 07/24/2013  . Essential hypertension, benign 07/24/2013  . Status post dilation of esophageal narrowing 07/24/2013  . Obesity, unspecified 07/24/2013    Allyson Sabal [facility] 06/18/2017, 11:51 AM  [facility]  [place] [employer] [place], [place], [address] Phone: [phone]   Fax:  [phone]  Name: JERLEAN PERALTA MRN: [id number] Date of Birth: 01-10-1951  Manus Gunning, PT 06/18/17 11:52 AM

## 2017-06-19 ENCOUNTER — Ambulatory Visit
Admission: RE | Admit: 2017-06-19 | Discharge: 2017-06-19 | Disposition: A | Discharge status: Home / Self Care | Payer: Medicare Other | Source: Ambulatory Visit | Attending: Radiation Oncology | Admitting: Radiation Oncology

## 2017-06-19 DIAGNOSIS — Z800 Family history of malignant neoplasm of digestive organs: Secondary | ICD-10-CM | POA: Diagnosis not present

## 2017-06-19 DIAGNOSIS — Z803 Family history of malignant neoplasm of breast: Secondary | ICD-10-CM | POA: Diagnosis not present

## 2017-06-19 DIAGNOSIS — Z510 Encounter for antineoplastic radiation therapy: Secondary | ICD-10-CM | POA: Diagnosis not present

## 2017-06-19 DIAGNOSIS — C50411 Malignant neoplasm of upper-outer quadrant of right female breast: Secondary | ICD-10-CM | POA: Diagnosis not present

## 2017-06-19 DIAGNOSIS — Z807 Family history of other malignant neoplasms of lymphoid, hematopoietic and related tissues: Secondary | ICD-10-CM | POA: Diagnosis not present

## 2017-06-19 DIAGNOSIS — Z7984 Long term (current) use of oral hypoglycemic drugs: Secondary | ICD-10-CM | POA: Diagnosis not present

## 2017-06-19 DIAGNOSIS — E119 Type 2 diabetes mellitus without complications: Secondary | ICD-10-CM | POA: Diagnosis not present

## 2017-06-19 DIAGNOSIS — Z79899 Other long term (current) drug therapy: Secondary | ICD-10-CM | POA: Diagnosis not present

## 2017-06-19 DIAGNOSIS — Z7982 Long term (current) use of aspirin: Secondary | ICD-10-CM | POA: Diagnosis not present

## 2017-06-19 DIAGNOSIS — Z9889 Other specified postprocedural states: Secondary | ICD-10-CM | POA: Diagnosis not present

## 2017-06-19 DIAGNOSIS — K219 Gastro-esophageal reflux disease without esophagitis: Secondary | ICD-10-CM | POA: Diagnosis not present

## 2017-06-19 DIAGNOSIS — Z170 Estrogen receptor positive status [ER+]: Secondary | ICD-10-CM | POA: Diagnosis not present

## 2017-06-19 DIAGNOSIS — I10 Essential (primary) hypertension: Secondary | ICD-10-CM | POA: Diagnosis not present

## 2017-06-19 DIAGNOSIS — Z8249 Family history of ischemic heart disease and other diseases of the circulatory system: Secondary | ICD-10-CM | POA: Diagnosis not present

## 2017-06-20 ENCOUNTER — Ambulatory Visit
Admission: RE | Admit: 2017-06-20 | Discharge: 2017-06-20 | Disposition: A | Discharge status: Home / Self Care | Payer: Medicare Other | Source: Ambulatory Visit | Attending: Radiation Oncology | Admitting: Radiation Oncology

## 2017-06-20 DIAGNOSIS — Z79899 Other long term (current) drug therapy: Secondary | ICD-10-CM | POA: Diagnosis not present

## 2017-06-20 DIAGNOSIS — Z803 Family history of malignant neoplasm of breast: Secondary | ICD-10-CM | POA: Diagnosis not present

## 2017-06-20 DIAGNOSIS — Z170 Estrogen receptor positive status [ER+]: Secondary | ICD-10-CM | POA: Diagnosis not present

## 2017-06-20 DIAGNOSIS — Z9889 Other specified postprocedural states: Secondary | ICD-10-CM | POA: Diagnosis not present

## 2017-06-20 DIAGNOSIS — Z800 Family history of malignant neoplasm of digestive organs: Secondary | ICD-10-CM | POA: Diagnosis not present

## 2017-06-20 DIAGNOSIS — Z7982 Long term (current) use of aspirin: Secondary | ICD-10-CM | POA: Diagnosis not present

## 2017-06-20 DIAGNOSIS — I10 Essential (primary) hypertension: Secondary | ICD-10-CM | POA: Diagnosis not present

## 2017-06-20 DIAGNOSIS — K219 Gastro-esophageal reflux disease without esophagitis: Secondary | ICD-10-CM | POA: Diagnosis not present

## 2017-06-20 DIAGNOSIS — Z807 Family history of other malignant neoplasms of lymphoid, hematopoietic and related tissues: Secondary | ICD-10-CM | POA: Diagnosis not present

## 2017-06-20 DIAGNOSIS — Z510 Encounter for antineoplastic radiation therapy: Secondary | ICD-10-CM | POA: Diagnosis not present

## 2017-06-20 DIAGNOSIS — C50411 Malignant neoplasm of upper-outer quadrant of right female breast: Secondary | ICD-10-CM | POA: Diagnosis not present

## 2017-06-20 DIAGNOSIS — E119 Type 2 diabetes mellitus without complications: Secondary | ICD-10-CM | POA: Diagnosis not present

## 2017-06-20 DIAGNOSIS — Z7984 Long term (current) use of oral hypoglycemic drugs: Secondary | ICD-10-CM | POA: Diagnosis not present

## 2017-06-20 DIAGNOSIS — Z8249 Family history of ischemic heart disease and other diseases of the circulatory system: Secondary | ICD-10-CM | POA: Diagnosis not present

## 2017-06-20 MED ORDER — RADIAPLEXRX EX GEL
Freq: Once | CUTANEOUS | Status: AC
  Administered 2017-06-20: 13:00:00 via TOPICAL

## 2017-06-20 MED ORDER — ALRA NON-METALLIC DEODORANT (RAD-ONC)
1.0000 "application " | Freq: Once | TOPICAL | Status: AC
  Administered 2017-06-20: 1 via TOPICAL

## 2017-06-20 NOTE — Progress Notes (Signed)
Patient education breast, my business card, ,alra deodorant,radiaplex gel, Radiation therapy and you book given, discussed side effects skin irritation, swelling, soreness, short sharp pains, fatigue, increase protein in diet, drink plenty fluids, stay hydrated, dove soap unscented recommended, pat dry after showering/bath, no rubbing scrubbing or scratching area, apply radiaplex gel after rad tx and bedtime, alra after rad tx and prn, sees MD weekly and prn, knows to call for any concerns, teach back given

## 2017-06-21 ENCOUNTER — Ambulatory Visit
Admission: RE | Admit: 2017-06-21 | Discharge: 2017-06-21 | Disposition: A | Discharge status: Home / Self Care | Payer: Medicare Other | Source: Ambulatory Visit | Attending: Radiation Oncology | Admitting: Radiation Oncology

## 2017-06-21 DIAGNOSIS — Z803 Family history of malignant neoplasm of breast: Secondary | ICD-10-CM | POA: Diagnosis not present

## 2017-06-21 DIAGNOSIS — Z8249 Family history of ischemic heart disease and other diseases of the circulatory system: Secondary | ICD-10-CM | POA: Diagnosis not present

## 2017-06-21 DIAGNOSIS — Z79899 Other long term (current) drug therapy: Secondary | ICD-10-CM | POA: Diagnosis not present

## 2017-06-21 DIAGNOSIS — I10 Essential (primary) hypertension: Secondary | ICD-10-CM | POA: Diagnosis not present

## 2017-06-21 DIAGNOSIS — Z9889 Other specified postprocedural states: Secondary | ICD-10-CM | POA: Diagnosis not present

## 2017-06-21 DIAGNOSIS — Z170 Estrogen receptor positive status [ER+]: Secondary | ICD-10-CM | POA: Diagnosis not present

## 2017-06-21 DIAGNOSIS — E119 Type 2 diabetes mellitus without complications: Secondary | ICD-10-CM | POA: Diagnosis not present

## 2017-06-21 DIAGNOSIS — Z800 Family history of malignant neoplasm of digestive organs: Secondary | ICD-10-CM | POA: Diagnosis not present

## 2017-06-21 DIAGNOSIS — C50411 Malignant neoplasm of upper-outer quadrant of right female breast: Secondary | ICD-10-CM | POA: Diagnosis not present

## 2017-06-21 DIAGNOSIS — K219 Gastro-esophageal reflux disease without esophagitis: Secondary | ICD-10-CM | POA: Diagnosis not present

## 2017-06-21 DIAGNOSIS — Z7982 Long term (current) use of aspirin: Secondary | ICD-10-CM | POA: Diagnosis not present

## 2017-06-21 DIAGNOSIS — Z1212 Encounter for screening for malignant neoplasm of rectum: Secondary | ICD-10-CM | POA: Diagnosis not present

## 2017-06-21 DIAGNOSIS — Z7984 Long term (current) use of oral hypoglycemic drugs: Secondary | ICD-10-CM | POA: Diagnosis not present

## 2017-06-21 DIAGNOSIS — Z510 Encounter for antineoplastic radiation therapy: Secondary | ICD-10-CM | POA: Diagnosis not present

## 2017-06-21 DIAGNOSIS — Z807 Family history of other malignant neoplasms of lymphoid, hematopoietic and related tissues: Secondary | ICD-10-CM | POA: Diagnosis not present

## 2017-06-21 DIAGNOSIS — Z1211 Encounter for screening for malignant neoplasm of colon: Secondary | ICD-10-CM | POA: Diagnosis not present

## 2017-06-21 LAB — COLOGUARD: Cologuard: NEGATIVE

## 2017-06-22 ENCOUNTER — Ambulatory Visit
Admission: RE | Admit: 2017-06-22 | Discharge: 2017-06-22 | Disposition: A | Discharge status: Home / Self Care | Payer: Medicare Other | Source: Ambulatory Visit | Attending: Radiation Oncology | Admitting: Radiation Oncology

## 2017-06-22 ENCOUNTER — Encounter: Payer: Self-pay | Admitting: Physical Therapy

## 2017-06-22 ENCOUNTER — Ambulatory Visit: Payer: Medicare Other | Admitting: Physical Therapy

## 2017-06-22 DIAGNOSIS — M25611 Stiffness of right shoulder, not elsewhere classified: Secondary | ICD-10-CM | POA: Diagnosis not present

## 2017-06-22 DIAGNOSIS — Z170 Estrogen receptor positive status [ER+]: Secondary | ICD-10-CM | POA: Diagnosis not present

## 2017-06-22 DIAGNOSIS — M25511 Pain in right shoulder: Secondary | ICD-10-CM

## 2017-06-22 DIAGNOSIS — R600 Localized edema: Secondary | ICD-10-CM | POA: Diagnosis not present

## 2017-06-22 DIAGNOSIS — Z803 Family history of malignant neoplasm of breast: Secondary | ICD-10-CM | POA: Diagnosis not present

## 2017-06-22 DIAGNOSIS — Z7984 Long term (current) use of oral hypoglycemic drugs: Secondary | ICD-10-CM | POA: Diagnosis not present

## 2017-06-22 DIAGNOSIS — I10 Essential (primary) hypertension: Secondary | ICD-10-CM | POA: Diagnosis not present

## 2017-06-22 DIAGNOSIS — K219 Gastro-esophageal reflux disease without esophagitis: Secondary | ICD-10-CM | POA: Diagnosis not present

## 2017-06-22 DIAGNOSIS — M6281 Muscle weakness (generalized): Secondary | ICD-10-CM | POA: Diagnosis not present

## 2017-06-22 DIAGNOSIS — Z9889 Other specified postprocedural states: Secondary | ICD-10-CM | POA: Diagnosis not present

## 2017-06-22 DIAGNOSIS — Z79899 Other long term (current) drug therapy: Secondary | ICD-10-CM | POA: Diagnosis not present

## 2017-06-22 DIAGNOSIS — E119 Type 2 diabetes mellitus without complications: Secondary | ICD-10-CM | POA: Diagnosis not present

## 2017-06-22 DIAGNOSIS — Z807 Family history of other malignant neoplasms of lymphoid, hematopoietic and related tissues: Secondary | ICD-10-CM | POA: Diagnosis not present

## 2017-06-22 DIAGNOSIS — C50411 Malignant neoplasm of upper-outer quadrant of right female breast: Secondary | ICD-10-CM | POA: Diagnosis not present

## 2017-06-22 DIAGNOSIS — Z510 Encounter for antineoplastic radiation therapy: Secondary | ICD-10-CM | POA: Diagnosis not present

## 2017-06-22 DIAGNOSIS — Z8249 Family history of ischemic heart disease and other diseases of the circulatory system: Secondary | ICD-10-CM | POA: Diagnosis not present

## 2017-06-22 DIAGNOSIS — Z800 Family history of malignant neoplasm of digestive organs: Secondary | ICD-10-CM | POA: Diagnosis not present

## 2017-06-22 DIAGNOSIS — Z7982 Long term (current) use of aspirin: Secondary | ICD-10-CM | POA: Diagnosis not present

## 2017-06-22 NOTE — Therapy (Signed)
[place], [place], [address] Phone: [phone]   Fax:  [phone]  Physical Therapy Treatment  Patient Details  Name: TRIA NOGUERA MRN: [id number] Date of Birth: [birth year] Referring Provider: Donne Hazel  Encounter Date: 06/22/2017      PT End of Session - 06/22/17 1158    Visit Number 5   Number of Visits 9   Date for PT Re-Evaluation 06/28/17   PT Start Time 8264   PT Stop Time 1104   PT Time Calculation (min) 41 min   Activity Tolerance Patient tolerated treatment well   Behavior During Therapy [facility] for tasks assessed/performed      Past Medical History:  Diagnosis Date  . Arthritis   . 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 Cancer ([place]) 04/2017   right breast cancer  . Colon polyp 2008  . Diabetes mellitus without complication ([place])   . Endometrial hyperplasia 2013  . GERD (gastroesophageal reflux disease)    OCC-NO MEDS  . Headache    H/O MIGRAINES  . Hypertension   . Papilloma 11/20/2016   right breast  . Status post dilation of esophageal narrowing 2008    Past Surgical History:  Procedure Laterality Date  . BREAST BIOPSY Right 11/02/2016   ATYPICAL PAPILLARY LESION,  . BREAST EXCISIONAL BIOPSY Right 11/20/2016   AHD  . BREAST LUMPECTOMY Right 11/20/2016   Procedure: BREAST LUMPECTOMY;  Surgeon: [name], MD;  Location: [facility];  Service: General;  Laterality: Right;  . COLONOSCOPY  2008   in [place]  . DILATION AND CURETTAGE OF UTERUS    . RADIOACTIVE SEED GUIDED MASTECTOMY WITH AXILLARY SENTINEL LYMPH NODE BIOPSY Right 05/16/2017   Procedure: RIGHT RADIOACTIVE SEED GUIDED LUMPECTOMY  WITH  RIGHT BREAST SEED GUIDED EXCISIONAL BIOPSY AND RIGHT AXILLARY SENTINEL  NODE BIOPSY;  Surgeon: [name], MD;  Location: [facility];  Service: General;  Laterality: Right;  2 SEEDS RIGHT BREAST  GENERAL AND PEC BLOCK ANESHTESIA     There were no vitals filed for this visit.      Subjective Assessment - 06/22/17 1024    Subjective To be truthful I have not done the exercises that much this week because I have been tired from radiation.    Pertinent History prior history of atypical ductal hyperplasia of the right breast who presented with a mammographic abnormality that led to ultrasound and a stereotactic biopsy on 04/20/2017. 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 HER-2 is pending, lumpectomy 05/16/17 with SLNB   Patient Stated Goals to get full range of motion   Currently in Pain? No/denies   Pain Score 0-No pain                         [facility] Adult PT Treatment/Exercise - 06/22/17 0001      Shoulder Exercises: Standing   Other Standing Exercises Strength After Breast Cancer program: x 10 reps for all strengthening exercises using 2 lb weights, 30 sec holds on stretches                        [place] - 06/22/17 1157      CC Long Term Goal  #1   Title Pt will be able to independently verbalize lymphedema risk reduction practices   Baseline Pt to attend ABC class on June 18,introduction done on  06/08/2017, 06/15/17- pt attended this class   Time 4   Period Weeks   Status Achieved     CC Long Term Goal  #2   Title Pt will demonstrate 165 degrees of right shoulder flexion to allow her to attain position necessary for radiation   Baseline 130, 155 on 06/08/2017, 06/15/17- 165 degrees   Time 4   Period Weeks   Status Achieved     CC Long Term Goal  #3   Title Pt will demonstrate 165 degrees of right shoulder abduction to allow her to attain position necessary for radiation and reach items out to sides   Baseline 110, 150 on 06/08/2017, 06/15/17- 168 degrees   Time 4   Period Weeks   Status Achieved     CC Long Term Goal  #4   Title Pt will be independent in a hom exercise program for strengthening and stretching    Time 4   Period Weeks   Status Achieved     CC Long Term Goal  #5   Title Pt will report a 75% improvement in tightness with overhead motion to allow improved comfort   Time 4   Period Weeks   Status Achieved            Plan - 07-16-17 1158    Clinical Impression Statement Re educated pt in Strength After Breast Cancer program. She has been having fatigue since beginning radiation and was encouraged to continue exercising to help decrease fatigue. She has now met all of her goals for therapy and will be discharged at this time. She was educated to return if she has any increase in swelling after she completes radiation.    Rehab Potential Good   Clinical Impairments Affecting Rehab Potential will begin radiation   PT Frequency 2x / week   PT Duration 4 weeks   PT Treatment/Interventions ADLs/Self Care Home Management;Therapeutic exercise;Patient/family education;Orthotic Fit/Training;Compression bandaging;Manual lymph drainage;Manual techniques;Passive range of motion;Taping   PT Next Visit Plan d/c this visit   PT Home Exercise Plan post op breast exercises, supine dowel exercises, supine scap, strength ABC   Consulted and Agree with Plan of Care Patient      Patient will benefit from skilled therapeutic intervention in order to improve the following deficits and impairments:  Increased fascial restricitons, Pain, Decreased scar mobility, Impaired UE functional use, Decreased strength, Decreased range of motion, Increased edema  Visit Diagnosis: Stiffness of right shoulder, not elsewhere classified  Acute pain of right shoulder  Muscle weakness (generalized)       G-Codes - 2017-07-16 1200    Functional Assessment Tool Used (Outpatient Only) Clinical Judgement   Functional Limitation Carrying, moving and handling objects   Carrying, Moving and Handling Objects Goal Status (D9242) At least 1 percent but less than 20 percent impaired, limited or restricted   Carrying, Moving  and Handling Objects Discharge Status [phone]) At least 1 percent but less than 20 percent impaired, limited or restricted      Problem List Patient Active Problem List   Diagnosis Date Noted  . Localized maculopapular rash 06/16/2017  . Malignant neoplasm of upper-outer quadrant of right breast in female, estrogen receptor positive ([place]) 04/26/2017  . Low back pain 01/29/2017  . Atypical ductal hyperplasia of right breast 01/15/2017  . Allergic rhinitis 04/12/2015  . Diabetes mellitus type 2, controlled ([place]) 07/24/2013  . Hyperlipidemia 07/24/2013  . Essential hypertension, benign 07/24/2013  . Status post dilation of esophageal narrowing 07/24/2013  .  Obesity, unspecified 07/24/2013    Allyson Sabal [facility] 06/22/2017, 12:01 PM  [place], [place], [address] Phone: [phone]   Fax:  [phone]  Name: KATHYANN SPAUGH MRN: [id number] Date of Birth: 1951/10/05  PHYSICAL THERAPY DISCHARGE SUMMARY  Visits from Start of Care: 5  Current functional level related to goals / functional outcomes: Pt has met all goals and is independent with a home exercise program   Remaining deficits: none   Education / Equipment: HEP Plan: Patient agrees to discharge.  Patient goals were met. Patient is being discharged due to meeting the stated rehab goals.  ?????     Allyson Sabal [place], [place] 06/22/17 12:01 PM

## 2017-06-25 ENCOUNTER — Encounter: Payer: Medicare Other | Admitting: Physical Therapy

## 2017-06-25 ENCOUNTER — Ambulatory Visit
Admission: RE | Admit: 2017-06-25 | Discharge: 2017-06-25 | Disposition: A | Discharge status: Home / Self Care | Payer: Medicare Other | Source: Ambulatory Visit | Attending: Radiation Oncology | Admitting: Radiation Oncology

## 2017-06-25 DIAGNOSIS — Z8249 Family history of ischemic heart disease and other diseases of the circulatory system: Secondary | ICD-10-CM | POA: Diagnosis not present

## 2017-06-25 DIAGNOSIS — Z803 Family history of malignant neoplasm of breast: Secondary | ICD-10-CM | POA: Diagnosis not present

## 2017-06-25 DIAGNOSIS — Z510 Encounter for antineoplastic radiation therapy: Secondary | ICD-10-CM | POA: Diagnosis not present

## 2017-06-25 DIAGNOSIS — Z79899 Other long term (current) drug therapy: Secondary | ICD-10-CM | POA: Diagnosis not present

## 2017-06-25 DIAGNOSIS — E119 Type 2 diabetes mellitus without complications: Secondary | ICD-10-CM | POA: Diagnosis not present

## 2017-06-25 DIAGNOSIS — Z9889 Other specified postprocedural states: Secondary | ICD-10-CM | POA: Diagnosis not present

## 2017-06-25 DIAGNOSIS — C50411 Malignant neoplasm of upper-outer quadrant of right female breast: Secondary | ICD-10-CM | POA: Diagnosis not present

## 2017-06-25 DIAGNOSIS — Z800 Family history of malignant neoplasm of digestive organs: Secondary | ICD-10-CM | POA: Diagnosis not present

## 2017-06-25 DIAGNOSIS — Z7982 Long term (current) use of aspirin: Secondary | ICD-10-CM | POA: Diagnosis not present

## 2017-06-25 DIAGNOSIS — Z807 Family history of other malignant neoplasms of lymphoid, hematopoietic and related tissues: Secondary | ICD-10-CM | POA: Diagnosis not present

## 2017-06-25 DIAGNOSIS — K219 Gastro-esophageal reflux disease without esophagitis: Secondary | ICD-10-CM | POA: Diagnosis not present

## 2017-06-25 DIAGNOSIS — I10 Essential (primary) hypertension: Secondary | ICD-10-CM | POA: Diagnosis not present

## 2017-06-25 DIAGNOSIS — Z7984 Long term (current) use of oral hypoglycemic drugs: Secondary | ICD-10-CM | POA: Diagnosis not present

## 2017-06-25 DIAGNOSIS — Z170 Estrogen receptor positive status [ER+]: Secondary | ICD-10-CM | POA: Diagnosis not present

## 2017-06-25 NOTE — Telephone Encounter (Signed)
Mailed unread message to patient.  

## 2017-06-26 ENCOUNTER — Ambulatory Visit
Admission: RE | Admit: 2017-06-26 | Discharge: 2017-06-26 | Disposition: A | Discharge status: Home / Self Care | Payer: Medicare Other | Source: Ambulatory Visit | Attending: Radiation Oncology | Admitting: Radiation Oncology

## 2017-06-26 DIAGNOSIS — Z807 Family history of other malignant neoplasms of lymphoid, hematopoietic and related tissues: Secondary | ICD-10-CM | POA: Diagnosis not present

## 2017-06-26 DIAGNOSIS — Z170 Estrogen receptor positive status [ER+]: Secondary | ICD-10-CM | POA: Diagnosis not present

## 2017-06-26 DIAGNOSIS — E119 Type 2 diabetes mellitus without complications: Secondary | ICD-10-CM | POA: Diagnosis not present

## 2017-06-26 DIAGNOSIS — Z79899 Other long term (current) drug therapy: Secondary | ICD-10-CM | POA: Diagnosis not present

## 2017-06-26 DIAGNOSIS — Z7982 Long term (current) use of aspirin: Secondary | ICD-10-CM | POA: Diagnosis not present

## 2017-06-26 DIAGNOSIS — Z803 Family history of malignant neoplasm of breast: Secondary | ICD-10-CM | POA: Diagnosis not present

## 2017-06-26 DIAGNOSIS — Z9889 Other specified postprocedural states: Secondary | ICD-10-CM | POA: Diagnosis not present

## 2017-06-26 DIAGNOSIS — C50411 Malignant neoplasm of upper-outer quadrant of right female breast: Secondary | ICD-10-CM | POA: Diagnosis not present

## 2017-06-26 DIAGNOSIS — Z800 Family history of malignant neoplasm of digestive organs: Secondary | ICD-10-CM | POA: Diagnosis not present

## 2017-06-26 DIAGNOSIS — Z510 Encounter for antineoplastic radiation therapy: Secondary | ICD-10-CM | POA: Diagnosis not present

## 2017-06-26 DIAGNOSIS — Z7984 Long term (current) use of oral hypoglycemic drugs: Secondary | ICD-10-CM | POA: Diagnosis not present

## 2017-06-26 DIAGNOSIS — Z8249 Family history of ischemic heart disease and other diseases of the circulatory system: Secondary | ICD-10-CM | POA: Diagnosis not present

## 2017-06-26 DIAGNOSIS — I10 Essential (primary) hypertension: Secondary | ICD-10-CM | POA: Diagnosis not present

## 2017-06-26 DIAGNOSIS — K219 Gastro-esophageal reflux disease without esophagitis: Secondary | ICD-10-CM | POA: Diagnosis not present

## 2017-06-28 ENCOUNTER — Ambulatory Visit
Admission: RE | Admit: 2017-06-28 | Discharge: 2017-06-28 | Disposition: A | Discharge status: Home / Self Care | Payer: Medicare Other | Source: Ambulatory Visit | Attending: Radiation Oncology | Admitting: Radiation Oncology

## 2017-06-28 ENCOUNTER — Telehealth: Payer: Self-pay | Admitting: Internal Medicine

## 2017-06-28 DIAGNOSIS — Z79899 Other long term (current) drug therapy: Secondary | ICD-10-CM | POA: Diagnosis not present

## 2017-06-28 DIAGNOSIS — Z7984 Long term (current) use of oral hypoglycemic drugs: Secondary | ICD-10-CM | POA: Diagnosis not present

## 2017-06-28 DIAGNOSIS — Z7982 Long term (current) use of aspirin: Secondary | ICD-10-CM | POA: Diagnosis not present

## 2017-06-28 DIAGNOSIS — Z800 Family history of malignant neoplasm of digestive organs: Secondary | ICD-10-CM | POA: Diagnosis not present

## 2017-06-28 DIAGNOSIS — Z170 Estrogen receptor positive status [ER+]: Secondary | ICD-10-CM | POA: Diagnosis not present

## 2017-06-28 DIAGNOSIS — Z510 Encounter for antineoplastic radiation therapy: Secondary | ICD-10-CM | POA: Diagnosis not present

## 2017-06-28 DIAGNOSIS — Z9889 Other specified postprocedural states: Secondary | ICD-10-CM | POA: Diagnosis not present

## 2017-06-28 DIAGNOSIS — Z803 Family history of malignant neoplasm of breast: Secondary | ICD-10-CM | POA: Diagnosis not present

## 2017-06-28 DIAGNOSIS — C50411 Malignant neoplasm of upper-outer quadrant of right female breast: Secondary | ICD-10-CM | POA: Diagnosis not present

## 2017-06-28 DIAGNOSIS — E119 Type 2 diabetes mellitus without complications: Secondary | ICD-10-CM | POA: Diagnosis not present

## 2017-06-28 DIAGNOSIS — K219 Gastro-esophageal reflux disease without esophagitis: Secondary | ICD-10-CM | POA: Diagnosis not present

## 2017-06-28 DIAGNOSIS — Z807 Family history of other malignant neoplasms of lymphoid, hematopoietic and related tissues: Secondary | ICD-10-CM | POA: Diagnosis not present

## 2017-06-28 DIAGNOSIS — I10 Essential (primary) hypertension: Secondary | ICD-10-CM | POA: Diagnosis not present

## 2017-06-28 DIAGNOSIS — Z8249 Family history of ischemic heart disease and other diseases of the circulatory system: Secondary | ICD-10-CM | POA: Diagnosis not present

## 2017-06-29 ENCOUNTER — Encounter: Payer: Medicare Other | Admitting: Physical Therapy

## 2017-06-29 ENCOUNTER — Ambulatory Visit
Admission: RE | Admit: 2017-06-29 | Discharge: 2017-06-29 | Disposition: A | Discharge status: Home / Self Care | Payer: Medicare Other | Source: Ambulatory Visit | Attending: Radiation Oncology | Admitting: Radiation Oncology

## 2017-06-29 DIAGNOSIS — Z800 Family history of malignant neoplasm of digestive organs: Secondary | ICD-10-CM | POA: Diagnosis not present

## 2017-06-29 DIAGNOSIS — Z7984 Long term (current) use of oral hypoglycemic drugs: Secondary | ICD-10-CM | POA: Diagnosis not present

## 2017-06-29 DIAGNOSIS — Z170 Estrogen receptor positive status [ER+]: Secondary | ICD-10-CM | POA: Diagnosis not present

## 2017-06-29 DIAGNOSIS — K219 Gastro-esophageal reflux disease without esophagitis: Secondary | ICD-10-CM | POA: Diagnosis not present

## 2017-06-29 DIAGNOSIS — Z510 Encounter for antineoplastic radiation therapy: Secondary | ICD-10-CM | POA: Diagnosis not present

## 2017-06-29 DIAGNOSIS — Z9889 Other specified postprocedural states: Secondary | ICD-10-CM | POA: Diagnosis not present

## 2017-06-29 DIAGNOSIS — I10 Essential (primary) hypertension: Secondary | ICD-10-CM | POA: Diagnosis not present

## 2017-06-29 DIAGNOSIS — E119 Type 2 diabetes mellitus without complications: Secondary | ICD-10-CM | POA: Diagnosis not present

## 2017-06-29 DIAGNOSIS — C50411 Malignant neoplasm of upper-outer quadrant of right female breast: Secondary | ICD-10-CM

## 2017-06-29 DIAGNOSIS — Z7982 Long term (current) use of aspirin: Secondary | ICD-10-CM | POA: Diagnosis not present

## 2017-06-29 DIAGNOSIS — Z803 Family history of malignant neoplasm of breast: Secondary | ICD-10-CM | POA: Diagnosis not present

## 2017-06-29 DIAGNOSIS — Z807 Family history of other malignant neoplasms of lymphoid, hematopoietic and related tissues: Secondary | ICD-10-CM | POA: Diagnosis not present

## 2017-06-29 DIAGNOSIS — Z79899 Other long term (current) drug therapy: Secondary | ICD-10-CM | POA: Diagnosis not present

## 2017-06-29 DIAGNOSIS — Z8249 Family history of ischemic heart disease and other diseases of the circulatory system: Secondary | ICD-10-CM | POA: Diagnosis not present

## 2017-06-29 MED ORDER — RADIAPLEXRX EX GEL
Freq: Once | CUTANEOUS | Status: AC
  Administered 2017-06-29: 15:00:00 via TOPICAL

## 2017-07-02 ENCOUNTER — Ambulatory Visit
Admission: RE | Admit: 2017-07-02 | Discharge: 2017-07-02 | Disposition: A | Discharge status: Home / Self Care | Payer: Medicare Other | Source: Ambulatory Visit | Attending: Radiation Oncology | Admitting: Radiation Oncology

## 2017-07-02 DIAGNOSIS — Z800 Family history of malignant neoplasm of digestive organs: Secondary | ICD-10-CM | POA: Diagnosis not present

## 2017-07-02 DIAGNOSIS — Z807 Family history of other malignant neoplasms of lymphoid, hematopoietic and related tissues: Secondary | ICD-10-CM | POA: Diagnosis not present

## 2017-07-02 DIAGNOSIS — E119 Type 2 diabetes mellitus without complications: Secondary | ICD-10-CM | POA: Diagnosis not present

## 2017-07-02 DIAGNOSIS — Z170 Estrogen receptor positive status [ER+]: Secondary | ICD-10-CM | POA: Diagnosis not present

## 2017-07-02 DIAGNOSIS — Z510 Encounter for antineoplastic radiation therapy: Secondary | ICD-10-CM | POA: Diagnosis not present

## 2017-07-02 DIAGNOSIS — K219 Gastro-esophageal reflux disease without esophagitis: Secondary | ICD-10-CM | POA: Diagnosis not present

## 2017-07-02 DIAGNOSIS — Z7984 Long term (current) use of oral hypoglycemic drugs: Secondary | ICD-10-CM | POA: Diagnosis not present

## 2017-07-02 DIAGNOSIS — Z803 Family history of malignant neoplasm of breast: Secondary | ICD-10-CM | POA: Diagnosis not present

## 2017-07-02 DIAGNOSIS — Z79899 Other long term (current) drug therapy: Secondary | ICD-10-CM | POA: Diagnosis not present

## 2017-07-02 DIAGNOSIS — C50411 Malignant neoplasm of upper-outer quadrant of right female breast: Secondary | ICD-10-CM | POA: Diagnosis not present

## 2017-07-02 DIAGNOSIS — Z7982 Long term (current) use of aspirin: Secondary | ICD-10-CM | POA: Diagnosis not present

## 2017-07-02 DIAGNOSIS — Z8249 Family history of ischemic heart disease and other diseases of the circulatory system: Secondary | ICD-10-CM | POA: Diagnosis not present

## 2017-07-02 DIAGNOSIS — Z9889 Other specified postprocedural states: Secondary | ICD-10-CM | POA: Diagnosis not present

## 2017-07-02 DIAGNOSIS — I10 Essential (primary) hypertension: Secondary | ICD-10-CM | POA: Diagnosis not present

## 2017-07-03 ENCOUNTER — Ambulatory Visit
Admission: RE | Admit: 2017-07-03 | Discharge: 2017-07-03 | Disposition: A | Discharge status: Home / Self Care | Payer: Medicare Other | Source: Ambulatory Visit | Attending: Radiation Oncology | Admitting: Radiation Oncology

## 2017-07-03 ENCOUNTER — Ambulatory Visit: Payer: Medicare Other | Admitting: Hematology and Oncology

## 2017-07-03 DIAGNOSIS — C50411 Malignant neoplasm of upper-outer quadrant of right female breast: Secondary | ICD-10-CM | POA: Diagnosis not present

## 2017-07-03 DIAGNOSIS — Z7982 Long term (current) use of aspirin: Secondary | ICD-10-CM | POA: Diagnosis not present

## 2017-07-03 DIAGNOSIS — K219 Gastro-esophageal reflux disease without esophagitis: Secondary | ICD-10-CM | POA: Diagnosis not present

## 2017-07-03 DIAGNOSIS — E119 Type 2 diabetes mellitus without complications: Secondary | ICD-10-CM | POA: Diagnosis not present

## 2017-07-03 DIAGNOSIS — Z7984 Long term (current) use of oral hypoglycemic drugs: Secondary | ICD-10-CM | POA: Diagnosis not present

## 2017-07-03 DIAGNOSIS — Z807 Family history of other malignant neoplasms of lymphoid, hematopoietic and related tissues: Secondary | ICD-10-CM | POA: Diagnosis not present

## 2017-07-03 DIAGNOSIS — Z9889 Other specified postprocedural states: Secondary | ICD-10-CM | POA: Diagnosis not present

## 2017-07-03 DIAGNOSIS — Z800 Family history of malignant neoplasm of digestive organs: Secondary | ICD-10-CM | POA: Diagnosis not present

## 2017-07-03 DIAGNOSIS — Z8249 Family history of ischemic heart disease and other diseases of the circulatory system: Secondary | ICD-10-CM | POA: Diagnosis not present

## 2017-07-03 DIAGNOSIS — Z170 Estrogen receptor positive status [ER+]: Secondary | ICD-10-CM | POA: Diagnosis not present

## 2017-07-03 DIAGNOSIS — Z510 Encounter for antineoplastic radiation therapy: Secondary | ICD-10-CM | POA: Diagnosis not present

## 2017-07-03 DIAGNOSIS — I10 Essential (primary) hypertension: Secondary | ICD-10-CM | POA: Diagnosis not present

## 2017-07-03 DIAGNOSIS — Z79899 Other long term (current) drug therapy: Secondary | ICD-10-CM | POA: Diagnosis not present

## 2017-07-03 DIAGNOSIS — Z803 Family history of malignant neoplasm of breast: Secondary | ICD-10-CM | POA: Diagnosis not present

## 2017-07-03 NOTE — Assessment & Plan Note (Deleted)
05/16/2017 Right lumpectomy: IDC grade 1, microscopic focus, low-grade DCIS, 0/3 lymph nodes negative, Atypical lobular hyperplasia, ER 100%, PR 100%, HER-2 negative ratio 1.63, Ki-67 insufficient, T38mc, N0 stage IA  Current treatment: Adjuvant radiation therapy started 06/18/2017  Treatment plan: After radiation is complete, antiestrogen therapy with anastrozole 1 mg daily will be started  I gave her a prescription today. She will start this after radiation is complete. She will follow with survivorship care plan and visit with LMendel Ryderin 3 months to assess tolerability to anastrozole therapy.

## 2017-07-04 ENCOUNTER — Ambulatory Visit
Admission: RE | Admit: 2017-07-04 | Discharge: 2017-07-04 | Disposition: A | Discharge status: Home / Self Care | Payer: Medicare Other | Source: Ambulatory Visit | Attending: Radiation Oncology | Admitting: Radiation Oncology

## 2017-07-04 DIAGNOSIS — Z170 Estrogen receptor positive status [ER+]: Secondary | ICD-10-CM | POA: Diagnosis not present

## 2017-07-04 DIAGNOSIS — K219 Gastro-esophageal reflux disease without esophagitis: Secondary | ICD-10-CM | POA: Diagnosis not present

## 2017-07-04 DIAGNOSIS — Z8249 Family history of ischemic heart disease and other diseases of the circulatory system: Secondary | ICD-10-CM | POA: Diagnosis not present

## 2017-07-04 DIAGNOSIS — Z807 Family history of other malignant neoplasms of lymphoid, hematopoietic and related tissues: Secondary | ICD-10-CM | POA: Diagnosis not present

## 2017-07-04 DIAGNOSIS — E119 Type 2 diabetes mellitus without complications: Secondary | ICD-10-CM | POA: Diagnosis not present

## 2017-07-04 DIAGNOSIS — Z7984 Long term (current) use of oral hypoglycemic drugs: Secondary | ICD-10-CM | POA: Diagnosis not present

## 2017-07-04 DIAGNOSIS — Z7982 Long term (current) use of aspirin: Secondary | ICD-10-CM | POA: Diagnosis not present

## 2017-07-04 DIAGNOSIS — Z9889 Other specified postprocedural states: Secondary | ICD-10-CM | POA: Diagnosis not present

## 2017-07-04 DIAGNOSIS — Z800 Family history of malignant neoplasm of digestive organs: Secondary | ICD-10-CM | POA: Diagnosis not present

## 2017-07-04 DIAGNOSIS — Z803 Family history of malignant neoplasm of breast: Secondary | ICD-10-CM | POA: Diagnosis not present

## 2017-07-04 DIAGNOSIS — C50411 Malignant neoplasm of upper-outer quadrant of right female breast: Secondary | ICD-10-CM | POA: Diagnosis not present

## 2017-07-04 DIAGNOSIS — Z510 Encounter for antineoplastic radiation therapy: Secondary | ICD-10-CM | POA: Diagnosis not present

## 2017-07-04 DIAGNOSIS — I10 Essential (primary) hypertension: Secondary | ICD-10-CM | POA: Diagnosis not present

## 2017-07-04 DIAGNOSIS — Z79899 Other long term (current) drug therapy: Secondary | ICD-10-CM | POA: Diagnosis not present

## 2017-07-05 ENCOUNTER — Ambulatory Visit
Admission: RE | Admit: 2017-07-05 | Discharge: 2017-07-05 | Disposition: A | Discharge status: Home / Self Care | Payer: Medicare Other | Source: Ambulatory Visit | Attending: Radiation Oncology | Admitting: Radiation Oncology

## 2017-07-05 DIAGNOSIS — Z800 Family history of malignant neoplasm of digestive organs: Secondary | ICD-10-CM | POA: Diagnosis not present

## 2017-07-05 DIAGNOSIS — E119 Type 2 diabetes mellitus without complications: Secondary | ICD-10-CM | POA: Diagnosis not present

## 2017-07-05 DIAGNOSIS — Z9889 Other specified postprocedural states: Secondary | ICD-10-CM | POA: Diagnosis not present

## 2017-07-05 DIAGNOSIS — K219 Gastro-esophageal reflux disease without esophagitis: Secondary | ICD-10-CM | POA: Diagnosis not present

## 2017-07-05 DIAGNOSIS — Z510 Encounter for antineoplastic radiation therapy: Secondary | ICD-10-CM | POA: Diagnosis not present

## 2017-07-05 DIAGNOSIS — I10 Essential (primary) hypertension: Secondary | ICD-10-CM | POA: Diagnosis not present

## 2017-07-05 DIAGNOSIS — Z7984 Long term (current) use of oral hypoglycemic drugs: Secondary | ICD-10-CM | POA: Diagnosis not present

## 2017-07-05 DIAGNOSIS — Z79899 Other long term (current) drug therapy: Secondary | ICD-10-CM | POA: Diagnosis not present

## 2017-07-05 DIAGNOSIS — Z7982 Long term (current) use of aspirin: Secondary | ICD-10-CM | POA: Diagnosis not present

## 2017-07-05 DIAGNOSIS — C50411 Malignant neoplasm of upper-outer quadrant of right female breast: Secondary | ICD-10-CM | POA: Diagnosis not present

## 2017-07-05 DIAGNOSIS — Z807 Family history of other malignant neoplasms of lymphoid, hematopoietic and related tissues: Secondary | ICD-10-CM | POA: Diagnosis not present

## 2017-07-05 DIAGNOSIS — Z803 Family history of malignant neoplasm of breast: Secondary | ICD-10-CM | POA: Diagnosis not present

## 2017-07-05 DIAGNOSIS — Z170 Estrogen receptor positive status [ER+]: Secondary | ICD-10-CM | POA: Diagnosis not present

## 2017-07-05 DIAGNOSIS — Z8249 Family history of ischemic heart disease and other diseases of the circulatory system: Secondary | ICD-10-CM | POA: Diagnosis not present

## 2017-07-06 ENCOUNTER — Ambulatory Visit
Admission: RE | Admit: 2017-07-06 | Discharge: 2017-07-06 | Disposition: A | Discharge status: Home / Self Care | Payer: Medicare Other | Source: Ambulatory Visit | Attending: Radiation Oncology | Admitting: Radiation Oncology

## 2017-07-06 DIAGNOSIS — Z170 Estrogen receptor positive status [ER+]: Secondary | ICD-10-CM | POA: Diagnosis not present

## 2017-07-06 DIAGNOSIS — Z510 Encounter for antineoplastic radiation therapy: Secondary | ICD-10-CM | POA: Diagnosis not present

## 2017-07-06 DIAGNOSIS — Z800 Family history of malignant neoplasm of digestive organs: Secondary | ICD-10-CM | POA: Diagnosis not present

## 2017-07-06 DIAGNOSIS — Z803 Family history of malignant neoplasm of breast: Secondary | ICD-10-CM | POA: Diagnosis not present

## 2017-07-06 DIAGNOSIS — K219 Gastro-esophageal reflux disease without esophagitis: Secondary | ICD-10-CM | POA: Diagnosis not present

## 2017-07-06 DIAGNOSIS — C50411 Malignant neoplasm of upper-outer quadrant of right female breast: Secondary | ICD-10-CM | POA: Diagnosis not present

## 2017-07-06 DIAGNOSIS — Z79899 Other long term (current) drug therapy: Secondary | ICD-10-CM | POA: Diagnosis not present

## 2017-07-06 DIAGNOSIS — I10 Essential (primary) hypertension: Secondary | ICD-10-CM | POA: Diagnosis not present

## 2017-07-06 DIAGNOSIS — Z7982 Long term (current) use of aspirin: Secondary | ICD-10-CM | POA: Diagnosis not present

## 2017-07-06 DIAGNOSIS — Z807 Family history of other malignant neoplasms of lymphoid, hematopoietic and related tissues: Secondary | ICD-10-CM | POA: Diagnosis not present

## 2017-07-06 DIAGNOSIS — Z8249 Family history of ischemic heart disease and other diseases of the circulatory system: Secondary | ICD-10-CM | POA: Diagnosis not present

## 2017-07-06 DIAGNOSIS — Z7984 Long term (current) use of oral hypoglycemic drugs: Secondary | ICD-10-CM | POA: Diagnosis not present

## 2017-07-06 DIAGNOSIS — Z9889 Other specified postprocedural states: Secondary | ICD-10-CM | POA: Diagnosis not present

## 2017-07-06 DIAGNOSIS — E119 Type 2 diabetes mellitus without complications: Secondary | ICD-10-CM | POA: Diagnosis not present

## 2017-07-09 ENCOUNTER — Ambulatory Visit
Admission: RE | Admit: 2017-07-09 | Discharge: 2017-07-09 | Disposition: A | Discharge status: Home / Self Care | Payer: Medicare Other | Source: Ambulatory Visit | Attending: Radiation Oncology | Admitting: Radiation Oncology

## 2017-07-09 DIAGNOSIS — Z7982 Long term (current) use of aspirin: Secondary | ICD-10-CM | POA: Diagnosis not present

## 2017-07-09 DIAGNOSIS — Z800 Family history of malignant neoplasm of digestive organs: Secondary | ICD-10-CM | POA: Diagnosis not present

## 2017-07-09 DIAGNOSIS — Z9889 Other specified postprocedural states: Secondary | ICD-10-CM | POA: Diagnosis not present

## 2017-07-09 DIAGNOSIS — E119 Type 2 diabetes mellitus without complications: Secondary | ICD-10-CM | POA: Diagnosis not present

## 2017-07-09 DIAGNOSIS — Z510 Encounter for antineoplastic radiation therapy: Secondary | ICD-10-CM | POA: Diagnosis not present

## 2017-07-09 DIAGNOSIS — Z7984 Long term (current) use of oral hypoglycemic drugs: Secondary | ICD-10-CM | POA: Diagnosis not present

## 2017-07-09 DIAGNOSIS — Z807 Family history of other malignant neoplasms of lymphoid, hematopoietic and related tissues: Secondary | ICD-10-CM | POA: Diagnosis not present

## 2017-07-09 DIAGNOSIS — Z79899 Other long term (current) drug therapy: Secondary | ICD-10-CM | POA: Diagnosis not present

## 2017-07-09 DIAGNOSIS — Z170 Estrogen receptor positive status [ER+]: Secondary | ICD-10-CM | POA: Diagnosis not present

## 2017-07-09 DIAGNOSIS — I10 Essential (primary) hypertension: Secondary | ICD-10-CM | POA: Diagnosis not present

## 2017-07-09 DIAGNOSIS — K219 Gastro-esophageal reflux disease without esophagitis: Secondary | ICD-10-CM | POA: Diagnosis not present

## 2017-07-09 DIAGNOSIS — C50411 Malignant neoplasm of upper-outer quadrant of right female breast: Secondary | ICD-10-CM | POA: Diagnosis not present

## 2017-07-09 DIAGNOSIS — Z803 Family history of malignant neoplasm of breast: Secondary | ICD-10-CM | POA: Diagnosis not present

## 2017-07-09 DIAGNOSIS — Z8249 Family history of ischemic heart disease and other diseases of the circulatory system: Secondary | ICD-10-CM | POA: Diagnosis not present

## 2017-07-10 ENCOUNTER — Ambulatory Visit
Admission: RE | Admit: 2017-07-10 | Discharge: 2017-07-10 | Disposition: A | Discharge status: Home / Self Care | Payer: Medicare Other | Source: Ambulatory Visit | Attending: Radiation Oncology | Admitting: Radiation Oncology

## 2017-07-10 ENCOUNTER — Encounter: Payer: Self-pay | Admitting: *Deleted

## 2017-07-10 DIAGNOSIS — Z170 Estrogen receptor positive status [ER+]: Secondary | ICD-10-CM | POA: Diagnosis not present

## 2017-07-10 DIAGNOSIS — Z800 Family history of malignant neoplasm of digestive organs: Secondary | ICD-10-CM | POA: Diagnosis not present

## 2017-07-10 DIAGNOSIS — Z9889 Other specified postprocedural states: Secondary | ICD-10-CM | POA: Diagnosis not present

## 2017-07-10 DIAGNOSIS — I10 Essential (primary) hypertension: Secondary | ICD-10-CM | POA: Diagnosis not present

## 2017-07-10 DIAGNOSIS — K219 Gastro-esophageal reflux disease without esophagitis: Secondary | ICD-10-CM | POA: Diagnosis not present

## 2017-07-10 DIAGNOSIS — Z7982 Long term (current) use of aspirin: Secondary | ICD-10-CM | POA: Diagnosis not present

## 2017-07-10 DIAGNOSIS — Z79899 Other long term (current) drug therapy: Secondary | ICD-10-CM | POA: Diagnosis not present

## 2017-07-10 DIAGNOSIS — Z8249 Family history of ischemic heart disease and other diseases of the circulatory system: Secondary | ICD-10-CM | POA: Diagnosis not present

## 2017-07-10 DIAGNOSIS — Z7984 Long term (current) use of oral hypoglycemic drugs: Secondary | ICD-10-CM | POA: Diagnosis not present

## 2017-07-10 DIAGNOSIS — Z807 Family history of other malignant neoplasms of lymphoid, hematopoietic and related tissues: Secondary | ICD-10-CM | POA: Diagnosis not present

## 2017-07-10 DIAGNOSIS — C50411 Malignant neoplasm of upper-outer quadrant of right female breast: Secondary | ICD-10-CM | POA: Diagnosis not present

## 2017-07-10 DIAGNOSIS — E119 Type 2 diabetes mellitus without complications: Secondary | ICD-10-CM | POA: Diagnosis not present

## 2017-07-10 DIAGNOSIS — Z510 Encounter for antineoplastic radiation therapy: Secondary | ICD-10-CM | POA: Diagnosis not present

## 2017-07-10 DIAGNOSIS — Z803 Family history of malignant neoplasm of breast: Secondary | ICD-10-CM | POA: Diagnosis not present

## 2017-07-11 ENCOUNTER — Ambulatory Visit
Admission: RE | Admit: 2017-07-11 | Discharge: 2017-07-11 | Disposition: A | Discharge status: Home / Self Care | Payer: Medicare Other | Source: Ambulatory Visit | Attending: Radiation Oncology | Admitting: Radiation Oncology

## 2017-07-11 DIAGNOSIS — I10 Essential (primary) hypertension: Secondary | ICD-10-CM | POA: Diagnosis not present

## 2017-07-11 DIAGNOSIS — Z807 Family history of other malignant neoplasms of lymphoid, hematopoietic and related tissues: Secondary | ICD-10-CM | POA: Diagnosis not present

## 2017-07-11 DIAGNOSIS — C50411 Malignant neoplasm of upper-outer quadrant of right female breast: Secondary | ICD-10-CM | POA: Diagnosis not present

## 2017-07-11 DIAGNOSIS — Z803 Family history of malignant neoplasm of breast: Secondary | ICD-10-CM | POA: Diagnosis not present

## 2017-07-11 DIAGNOSIS — Z510 Encounter for antineoplastic radiation therapy: Secondary | ICD-10-CM | POA: Diagnosis not present

## 2017-07-11 DIAGNOSIS — Z7984 Long term (current) use of oral hypoglycemic drugs: Secondary | ICD-10-CM | POA: Diagnosis not present

## 2017-07-11 DIAGNOSIS — Z170 Estrogen receptor positive status [ER+]: Secondary | ICD-10-CM | POA: Diagnosis not present

## 2017-07-11 DIAGNOSIS — Z7982 Long term (current) use of aspirin: Secondary | ICD-10-CM | POA: Diagnosis not present

## 2017-07-11 DIAGNOSIS — K219 Gastro-esophageal reflux disease without esophagitis: Secondary | ICD-10-CM | POA: Diagnosis not present

## 2017-07-11 DIAGNOSIS — Z79899 Other long term (current) drug therapy: Secondary | ICD-10-CM | POA: Diagnosis not present

## 2017-07-11 DIAGNOSIS — Z9889 Other specified postprocedural states: Secondary | ICD-10-CM | POA: Diagnosis not present

## 2017-07-11 DIAGNOSIS — E119 Type 2 diabetes mellitus without complications: Secondary | ICD-10-CM | POA: Diagnosis not present

## 2017-07-11 DIAGNOSIS — Z800 Family history of malignant neoplasm of digestive organs: Secondary | ICD-10-CM | POA: Diagnosis not present

## 2017-07-11 DIAGNOSIS — Z8249 Family history of ischemic heart disease and other diseases of the circulatory system: Secondary | ICD-10-CM | POA: Diagnosis not present

## 2017-07-12 ENCOUNTER — Ambulatory Visit: Payer: Medicare Other

## 2017-07-12 NOTE — Telephone Encounter (Signed)
Unread message sent to patient.

## 2017-07-13 ENCOUNTER — Ambulatory Visit
Admission: RE | Admit: 2017-07-13 | Discharge: 2017-07-13 | Disposition: A | Discharge status: Home / Self Care | Payer: Medicare Other | Source: Ambulatory Visit | Attending: Radiation Oncology | Admitting: Radiation Oncology

## 2017-07-13 ENCOUNTER — Other Ambulatory Visit: Payer: Self-pay | Admitting: Internal Medicine

## 2017-07-13 DIAGNOSIS — Z8249 Family history of ischemic heart disease and other diseases of the circulatory system: Secondary | ICD-10-CM | POA: Diagnosis not present

## 2017-07-13 DIAGNOSIS — Z510 Encounter for antineoplastic radiation therapy: Secondary | ICD-10-CM | POA: Diagnosis not present

## 2017-07-13 DIAGNOSIS — E119 Type 2 diabetes mellitus without complications: Secondary | ICD-10-CM | POA: Diagnosis not present

## 2017-07-13 DIAGNOSIS — Z170 Estrogen receptor positive status [ER+]: Secondary | ICD-10-CM | POA: Diagnosis not present

## 2017-07-13 DIAGNOSIS — Z7984 Long term (current) use of oral hypoglycemic drugs: Secondary | ICD-10-CM | POA: Diagnosis not present

## 2017-07-13 DIAGNOSIS — I10 Essential (primary) hypertension: Secondary | ICD-10-CM | POA: Diagnosis not present

## 2017-07-13 DIAGNOSIS — K219 Gastro-esophageal reflux disease without esophagitis: Secondary | ICD-10-CM | POA: Diagnosis not present

## 2017-07-13 DIAGNOSIS — Z803 Family history of malignant neoplasm of breast: Secondary | ICD-10-CM | POA: Diagnosis not present

## 2017-07-13 DIAGNOSIS — Z7982 Long term (current) use of aspirin: Secondary | ICD-10-CM | POA: Diagnosis not present

## 2017-07-13 DIAGNOSIS — C50411 Malignant neoplasm of upper-outer quadrant of right female breast: Secondary | ICD-10-CM | POA: Diagnosis not present

## 2017-07-13 DIAGNOSIS — Z800 Family history of malignant neoplasm of digestive organs: Secondary | ICD-10-CM | POA: Diagnosis not present

## 2017-07-13 DIAGNOSIS — Z9889 Other specified postprocedural states: Secondary | ICD-10-CM | POA: Diagnosis not present

## 2017-07-13 DIAGNOSIS — Z79899 Other long term (current) drug therapy: Secondary | ICD-10-CM | POA: Diagnosis not present

## 2017-07-13 DIAGNOSIS — Z807 Family history of other malignant neoplasms of lymphoid, hematopoietic and related tissues: Secondary | ICD-10-CM | POA: Diagnosis not present

## 2017-07-16 ENCOUNTER — Ambulatory Visit: Payer: Medicare Other

## 2017-07-16 ENCOUNTER — Ambulatory Visit
Admission: RE | Admit: 2017-07-16 | Discharge: 2017-07-16 | Disposition: A | Discharge status: Home / Self Care | Payer: Medicare Other | Source: Ambulatory Visit | Attending: Radiation Oncology | Admitting: Radiation Oncology

## 2017-07-16 DIAGNOSIS — Z803 Family history of malignant neoplasm of breast: Secondary | ICD-10-CM | POA: Diagnosis not present

## 2017-07-16 DIAGNOSIS — C50411 Malignant neoplasm of upper-outer quadrant of right female breast: Secondary | ICD-10-CM | POA: Diagnosis not present

## 2017-07-16 DIAGNOSIS — Z510 Encounter for antineoplastic radiation therapy: Secondary | ICD-10-CM | POA: Diagnosis not present

## 2017-07-16 DIAGNOSIS — Z800 Family history of malignant neoplasm of digestive organs: Secondary | ICD-10-CM | POA: Diagnosis not present

## 2017-07-16 DIAGNOSIS — Z9889 Other specified postprocedural states: Secondary | ICD-10-CM | POA: Diagnosis not present

## 2017-07-16 DIAGNOSIS — Z79899 Other long term (current) drug therapy: Secondary | ICD-10-CM | POA: Diagnosis not present

## 2017-07-16 DIAGNOSIS — Z170 Estrogen receptor positive status [ER+]: Secondary | ICD-10-CM | POA: Diagnosis not present

## 2017-07-16 DIAGNOSIS — Z8249 Family history of ischemic heart disease and other diseases of the circulatory system: Secondary | ICD-10-CM | POA: Diagnosis not present

## 2017-07-16 DIAGNOSIS — K219 Gastro-esophageal reflux disease without esophagitis: Secondary | ICD-10-CM | POA: Diagnosis not present

## 2017-07-16 DIAGNOSIS — E119 Type 2 diabetes mellitus without complications: Secondary | ICD-10-CM | POA: Diagnosis not present

## 2017-07-16 DIAGNOSIS — Z807 Family history of other malignant neoplasms of lymphoid, hematopoietic and related tissues: Secondary | ICD-10-CM | POA: Diagnosis not present

## 2017-07-16 DIAGNOSIS — Z7984 Long term (current) use of oral hypoglycemic drugs: Secondary | ICD-10-CM | POA: Diagnosis not present

## 2017-07-16 DIAGNOSIS — Z7982 Long term (current) use of aspirin: Secondary | ICD-10-CM | POA: Diagnosis not present

## 2017-07-16 DIAGNOSIS — I10 Essential (primary) hypertension: Secondary | ICD-10-CM | POA: Diagnosis not present

## 2017-07-17 ENCOUNTER — Encounter: Payer: Self-pay | Admitting: Radiation Oncology

## 2017-07-17 ENCOUNTER — Ambulatory Visit
Admission: RE | Admit: 2017-07-17 | Discharge: 2017-07-17 | Disposition: A | Discharge status: Home / Self Care | Payer: Medicare Other | Source: Ambulatory Visit | Attending: Radiation Oncology | Admitting: Radiation Oncology

## 2017-07-17 ENCOUNTER — Ambulatory Visit: Payer: Medicare Other

## 2017-07-17 ENCOUNTER — Encounter: Payer: Self-pay | Admitting: *Deleted

## 2017-07-17 DIAGNOSIS — K219 Gastro-esophageal reflux disease without esophagitis: Secondary | ICD-10-CM | POA: Diagnosis not present

## 2017-07-17 DIAGNOSIS — I10 Essential (primary) hypertension: Secondary | ICD-10-CM | POA: Diagnosis not present

## 2017-07-17 DIAGNOSIS — C50411 Malignant neoplasm of upper-outer quadrant of right female breast: Secondary | ICD-10-CM | POA: Diagnosis not present

## 2017-07-17 DIAGNOSIS — Z803 Family history of malignant neoplasm of breast: Secondary | ICD-10-CM | POA: Diagnosis not present

## 2017-07-17 DIAGNOSIS — Z7984 Long term (current) use of oral hypoglycemic drugs: Secondary | ICD-10-CM | POA: Diagnosis not present

## 2017-07-17 DIAGNOSIS — Z9889 Other specified postprocedural states: Secondary | ICD-10-CM | POA: Diagnosis not present

## 2017-07-17 DIAGNOSIS — Z79899 Other long term (current) drug therapy: Secondary | ICD-10-CM | POA: Diagnosis not present

## 2017-07-17 DIAGNOSIS — Z510 Encounter for antineoplastic radiation therapy: Secondary | ICD-10-CM | POA: Diagnosis not present

## 2017-07-17 DIAGNOSIS — Z7982 Long term (current) use of aspirin: Secondary | ICD-10-CM | POA: Diagnosis not present

## 2017-07-17 DIAGNOSIS — Z807 Family history of other malignant neoplasms of lymphoid, hematopoietic and related tissues: Secondary | ICD-10-CM | POA: Diagnosis not present

## 2017-07-17 DIAGNOSIS — Z8249 Family history of ischemic heart disease and other diseases of the circulatory system: Secondary | ICD-10-CM | POA: Diagnosis not present

## 2017-07-17 DIAGNOSIS — Z800 Family history of malignant neoplasm of digestive organs: Secondary | ICD-10-CM | POA: Diagnosis not present

## 2017-07-17 DIAGNOSIS — E119 Type 2 diabetes mellitus without complications: Secondary | ICD-10-CM | POA: Diagnosis not present

## 2017-07-17 DIAGNOSIS — Z170 Estrogen receptor positive status [ER+]: Secondary | ICD-10-CM | POA: Diagnosis not present

## 2017-07-18 ENCOUNTER — Telehealth: Payer: Self-pay | Admitting: Hematology and Oncology

## 2017-07-18 NOTE — Telephone Encounter (Signed)
lvm to inform pt of SCP appt 10/26 at 10 am per sch msg

## 2017-07-18 NOTE — Progress Notes (Signed)
  Radiation Oncology         [phone] ________________________________  Name: Martha [name] MRN: [id number]  Date: 07/17/2017  DOB: 1951/01/04  End of Treatment Note  Diagnosis:  Stage IA, pTmi, pN0, ER/PR positive grade 1 invasive ductal carcinoma and DCIS of the right breast.       Indication for treatment:  Curative       Radiation treatment dates:   06/18/2017 to 07/17/2017  Site/dose:    1. The Right breast was treated to 42.496 Gy in 16 fractions at 2.66 Gy per fraction. 2. The Right breast was boosted to 10 Gy in 4 fractions at 2.5 Gy per fraction.  Beams/energy:    1. 3D // 10X, 15X 2. Photon boost // 10X, 6X  Narrative: The patient tolerated radiation treatment relatively well.  She developed hyperpigmentation within the treatment area. Reports occasional twinges in the right breast and mild fatigue.  Plan: The patient has completed radiation treatment. The patient will return to radiation oncology clinic for routine followup in one month. I advised them to call or return sooner if they have any questions or concerns related to their recovery or treatment.  ------------------------------------------------  Jodelle Gross, MD, PhD  This document serves as a record of services personally performed by Kyung Rudd, MD. It was created on his behalf by Arlyce Harman, a trained medical scribe. The creation of this record is based on the scribe's personal observations and the provider's statements to them. This document has been checked and approved by the attending provider.

## 2017-07-19 ENCOUNTER — Encounter: Payer: Self-pay | Admitting: Adult Health

## 2017-07-27 ENCOUNTER — Telehealth: Payer: Self-pay | Admitting: Internal Medicine

## 2017-07-27 ENCOUNTER — Encounter: Payer: Self-pay | Admitting: Internal Medicine

## 2017-07-27 NOTE — Telephone Encounter (Signed)
The patient received an automated message stating she needed to schedule her colonoscopy. She has had a Cologuard test done and it was negative. Patients states that it was on her mychart. Please update the patient chart .

## 2017-07-27 NOTE — Telephone Encounter (Signed)
I have updated it, thanks

## 2017-08-01 DIAGNOSIS — H5203 Hypermetropia, bilateral: Secondary | ICD-10-CM | POA: Diagnosis not present

## 2017-08-01 DIAGNOSIS — H52223 Regular astigmatism, bilateral: Secondary | ICD-10-CM | POA: Diagnosis not present

## 2017-08-01 DIAGNOSIS — H524 Presbyopia: Secondary | ICD-10-CM | POA: Diagnosis not present

## 2017-08-01 LAB — HM DIABETES EYE EXAM

## 2017-08-08 ENCOUNTER — Other Ambulatory Visit: Payer: Self-pay | Admitting: Internal Medicine

## 2017-08-08 MED ORDER — ERGOCALCIFEROL 1.25 MG (50000 UT) PO CAPS: 50000.0000 [IU] | ORAL_CAPSULE | ORAL | 2 refills | Status: DC

## 2017-08-08 MED ORDER — ERGOCALCIFEROL 1.25 MG (50000 UT) PO CAPS
50000.0000 [IU] | ORAL_CAPSULE | ORAL | 2 refills | Status: DC
Start: 2017-08-08 — End: 2017-08-08

## 2017-08-08 NOTE — Telephone Encounter (Signed)
Medication called to pharmacy patient aware.

## 2017-08-08 NOTE — Telephone Encounter (Signed)
Pt called and stated that she was going through some old mail and found the letter that stated that Dr. Derrel Nip was going to send in a mega dose of Vitamin D. Pt asked if we could send it to [id number] - [place], [place]. Please advise, thank you!  Call pt @ [phone]

## 2017-08-21 ENCOUNTER — Ambulatory Visit
Admission: RE | Admit: 2017-08-21 | Discharge: 2017-08-21 | Disposition: A | Discharge status: Home / Self Care | Payer: Medicare Other | Source: Ambulatory Visit | Attending: Radiation Oncology | Admitting: Radiation Oncology

## 2017-08-21 ENCOUNTER — Encounter: Payer: Self-pay | Admitting: Radiation Oncology

## 2017-08-21 ENCOUNTER — Telehealth: Payer: Self-pay | Admitting: *Deleted

## 2017-08-21 VITALS — BP 122/62 | HR 73 | Temp 97.6°F | Resp 20 | Wt 201.0 lb

## 2017-08-21 DIAGNOSIS — C50411 Malignant neoplasm of upper-outer quadrant of right female breast: Secondary | ICD-10-CM | POA: Diagnosis not present

## 2017-08-21 DIAGNOSIS — Z807 Family history of other malignant neoplasms of lymphoid, hematopoietic and related tissues: Secondary | ICD-10-CM | POA: Diagnosis not present

## 2017-08-21 DIAGNOSIS — Z170 Estrogen receptor positive status [ER+]: Principal | ICD-10-CM

## 2017-08-21 DIAGNOSIS — Z8249 Family history of ischemic heart disease and other diseases of the circulatory system: Secondary | ICD-10-CM | POA: Diagnosis not present

## 2017-08-21 DIAGNOSIS — E119 Type 2 diabetes mellitus without complications: Secondary | ICD-10-CM | POA: Diagnosis not present

## 2017-08-21 DIAGNOSIS — I10 Essential (primary) hypertension: Secondary | ICD-10-CM | POA: Diagnosis not present

## 2017-08-21 DIAGNOSIS — Z7982 Long term (current) use of aspirin: Secondary | ICD-10-CM | POA: Diagnosis not present

## 2017-08-21 DIAGNOSIS — Z803 Family history of malignant neoplasm of breast: Secondary | ICD-10-CM | POA: Diagnosis not present

## 2017-08-21 DIAGNOSIS — Z800 Family history of malignant neoplasm of digestive organs: Secondary | ICD-10-CM | POA: Diagnosis not present

## 2017-08-21 DIAGNOSIS — Z9889 Other specified postprocedural states: Secondary | ICD-10-CM | POA: Diagnosis not present

## 2017-08-21 DIAGNOSIS — K219 Gastro-esophageal reflux disease without esophagitis: Secondary | ICD-10-CM | POA: Diagnosis not present

## 2017-08-21 DIAGNOSIS — Z7984 Long term (current) use of oral hypoglycemic drugs: Secondary | ICD-10-CM | POA: Diagnosis not present

## 2017-08-21 DIAGNOSIS — Z510 Encounter for antineoplastic radiation therapy: Secondary | ICD-10-CM | POA: Diagnosis not present

## 2017-08-21 DIAGNOSIS — Z79899 Other long term (current) drug therapy: Secondary | ICD-10-CM | POA: Diagnosis not present

## 2017-08-21 MED ORDER — RADIAPLEXRX EX GEL
Freq: Once | CUTANEOUS | Status: AC
  Administered 2017-08-21: 17:00:00 via TOPICAL

## 2017-08-21 NOTE — Telephone Encounter (Signed)
error 

## 2017-08-21 NOTE — Progress Notes (Signed)
Radiation Oncology         [phone] ________________________________  Name: Martha [name] MRN: [id number]  Date of Service: 08/21/2017  DOB: Sep 05, 1951  Post Treatment Note  CC: Martha Mc, MD  [name], MD  Diagnosis:   Stage IA, pTmi, pN0, ER/PR positive grade 1 invasive ductal carcinoma and DCIS of the right breast.     Interval Since Last Radiation: 5 weeks   06/18/2017 to 07/17/2017: 1. The Right breast was treated to 42.496 Gy in 16 fractions at 2.66 Gy per fraction. 2. The Right breast was boosted to 10 Gy in 4 fractions at 2.5 Gy per fraction.  Narrative:  The patient returns today for routine follow-up. During treatment she did very well with radiotherapy and did not have significant desquamation.                             On review of systems, the patient states she is doing well overall. She denies any skin changes or concerns. She reports she's anxious to get back into swimming. No other complaints are noted.   ALLERGIES:  has No Known Allergies.  Meds: Current Outpatient Prescriptions  Medication Sig Dispense Refill  . aspirin 81 MG tablet Take 81 mg by mouth daily.    . Cholecalciferol (VITAMIN D-3 PO) Take 2,000 Int'l Units by mouth daily.    . ergocalciferol (DRISDOL) 50000 units capsule Take 1 capsule (50,000 Units total) by mouth once a week. 4 capsule 2  . fluticasone (FLONASE) 50 MCG/ACT nasal spray Place 2 sprays into both nostrils daily. (Patient taking differently: Place 2 sprays into both nostrils daily as needed. ) 16 g 2  . hyaluronate sodium (RADIAPLEXRX) GEL Apply 1 application topically 2 (two) times daily.    Marland Kitchen lisinopril-hydrochlorothiazide (PRINZIDE,ZESTORETIC) 20-12.5 MG tablet Take 1 tablet by mouth daily. 90 tablet 3  . metFORMIN (GLUCOPHAGE) 500 MG tablet TAKE 1 TABLET BY MOUTH TWO  TIMES DAILY WITH MEALS 180 tablet 3  . non-metallic deodorant (ALRA) MISC Apply 1 application topically.    . simvastatin (ZOCOR) 40 MG tablet TAKE 1  TABLET BY MOUTH  DAILY 90 tablet 3  . triamcinolone cream (KENALOG) 0.1 % Apply topically 2 (two) times daily. For itchy rash 30 g 1   Current Facility-Administered Medications  Medication Dose Route Frequency Provider Last Rate Last Dose  . hyaluronate sodium (RADIAPLEXRX) gel   Topical Once Kyung Rudd, MD        Physical Findings:  weight is 201 lb (91.2 kg). Her oral temperature is 97.6 F (36.4 C). Her blood pressure is 122/62 and her pulse is 73. Her respiration is 20 and oxygen saturation is 99%.  Pain Assessment Pain Score: 0-No pain/10 In general this is a well appearing African American female in no acute distress. She's alert and oriented x4 and appropriate throughout the examination. Cardiopulmonary assessment is negative for acute distress and she exhibits normal effort. The right breast was examined and reveals mild hyperpigmentation without desquamation.   Lab Findings: Lab Results  Component Value Date   WBC 8.8 07/20/2016   HGB 12.1 07/20/2016   HCT 36.9 07/20/2016   MCV 91.7 07/20/2016   PLT 323.0 07/20/2016     Radiographic Findings: No results found.  Impression/Plan: 1. 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 The patient has been doing well since completion of radiotherapy. We discussed that  we would be happy to continue to follow her as needed, but she will also continue to follow up with Dr. Lindi Adie in medical oncology. She was counseled on skin care as well as measures to avoid sun exposure to this area.  2. Survivorship. She will be seen in October in survivorship clinic and we outlined the rationale for keeping her appointment.     Carola Rhine, PAC

## 2017-08-22 DIAGNOSIS — H25812 Combined forms of age-related cataract, left eye: Secondary | ICD-10-CM | POA: Diagnosis not present

## 2017-08-22 DIAGNOSIS — H25811 Combined forms of age-related cataract, right eye: Secondary | ICD-10-CM | POA: Diagnosis not present

## 2017-08-22 DIAGNOSIS — E119 Type 2 diabetes mellitus without complications: Secondary | ICD-10-CM | POA: Diagnosis not present

## 2017-08-22 DIAGNOSIS — H02831 Dermatochalasis of right upper eyelid: Secondary | ICD-10-CM | POA: Diagnosis not present

## 2017-08-28 ENCOUNTER — Other Ambulatory Visit: Payer: Self-pay

## 2017-08-28 DIAGNOSIS — I10 Essential (primary) hypertension: Secondary | ICD-10-CM

## 2017-08-28 MED ORDER — LISINOPRIL-HYDROCHLOROTHIAZIDE 20-12.5 MG PO TABS: 1.0000 | ORAL_TABLET | Freq: Every day | ORAL | 1 refills | Status: DC

## 2017-09-06 DIAGNOSIS — H2511 Age-related nuclear cataract, right eye: Secondary | ICD-10-CM | POA: Diagnosis not present

## 2017-09-06 DIAGNOSIS — H25811 Combined forms of age-related cataract, right eye: Secondary | ICD-10-CM | POA: Diagnosis not present

## 2017-09-14 ENCOUNTER — Other Ambulatory Visit (INDEPENDENT_AMBULATORY_CARE_PROVIDER_SITE_OTHER): Payer: Medicare Other

## 2017-09-14 DIAGNOSIS — E785 Hyperlipidemia, unspecified: Secondary | ICD-10-CM | POA: Diagnosis not present

## 2017-09-14 DIAGNOSIS — E119 Type 2 diabetes mellitus without complications: Secondary | ICD-10-CM

## 2017-09-14 LAB — COMPREHENSIVE METABOLIC PANEL
ALT: 8 U/L (ref 0–35)
AST: 12 U/L (ref 0–37)
Albumin: 4 g/dL (ref 3.5–5.2)
Alkaline Phosphatase: 91 U/L (ref 39–117)
BUN: 19 mg/dL (ref 6–23)
CO2: 30 mEq/L (ref 19–32)
Calcium: 9.7 mg/dL (ref 8.4–10.5)
Chloride: 104 mEq/L (ref 96–112)
Creatinine, Ser: 1.05 mg/dL (ref 0.40–1.20)
GFR: 67.17 mL/min (ref >60.00)
Glucose, Bld: 115 mg/dL — ABNORMAL HIGH (ref 70–99)
Potassium: 4.6 mEq/L (ref 3.5–5.1)
Sodium: 140 mEq/L (ref 135–145)
Total Bilirubin: 0.4 mg/dL (ref 0.2–1.2)
Total Protein: 6.6 g/dL (ref 6.0–8.3)

## 2017-09-14 LAB — LDL CHOLESTEROL, DIRECT: Direct LDL: 71 mg/dL

## 2017-09-14 LAB — LIPID PANEL
Cholesterol: 160 mg/dL (ref 0–200)
HDL: 59.7 mg/dL (ref >39.00)
LDL Cholesterol: 83 mg/dL (ref 0–99)
NonHDL: 100
Total CHOL/HDL Ratio: 3
Triglycerides: 86 mg/dL (ref 0.0–149.0)
VLDL: 17.2 mg/dL (ref 0.0–40.0)

## 2017-09-14 LAB — HEMOGLOBIN A1C: Hgb A1c MFr Bld: 6.4 % (ref 4.6–6.5)

## 2017-09-19 ENCOUNTER — Encounter: Payer: Self-pay | Admitting: Internal Medicine

## 2017-09-19 ENCOUNTER — Ambulatory Visit (INDEPENDENT_AMBULATORY_CARE_PROVIDER_SITE_OTHER): Payer: Medicare Other | Admitting: Internal Medicine

## 2017-09-19 DIAGNOSIS — E7800 Pure hypercholesterolemia, unspecified: Secondary | ICD-10-CM | POA: Diagnosis not present

## 2017-09-19 DIAGNOSIS — I10 Essential (primary) hypertension: Secondary | ICD-10-CM | POA: Diagnosis not present

## 2017-09-19 DIAGNOSIS — E1121 Type 2 diabetes mellitus with diabetic nephropathy: Secondary | ICD-10-CM | POA: Diagnosis not present

## 2017-09-19 DIAGNOSIS — Z6836 Body mass index (BMI) 36.0-36.9, adult: Secondary | ICD-10-CM | POA: Diagnosis not present

## 2017-09-19 NOTE — Patient Instructions (Addendum)
Your diabetes remains under excellent control  And your cholesterol and other labs are also normal. Please continue your current medications. return in 6 months for follow up on diabetes.  If you have another episode of burning/itching during voiding,  I will work you in to rule out infection. If the urine tests are negative,  We can try a steroid cream to use as needed (sparingly)

## 2017-09-19 NOTE — Progress Notes (Signed)
Subjective:  Patient ID: Martha [name], female    DOB: [birth year]  Age: 66 y.o. MRN: [id number]  CC: Diagnoses of Controlled type 2 diabetes mellitus with diabetic nephropathy, without long-term current use of insulin (HCC), Class 2 severe obesity due to excess calories with serious comorbidity and body mass index (BMI) of 36.0 to 36.9 in adult [facility]), Pure hypercholesterolemia, and Essential hypertension, benign were pertinent to this visit.  HPI Martha [name] presents for  3 month follow up on diabetes.  Patient has no complaints today.  Patient is following a low glycemic index diet and taking all prescribed medications regularly without side effects.  Fasting sugars have been under less than 140 most of the time and post prandials have been under 160 except on rare occasions. Patient is exercising about 3 times per week and intentionally trying to lose weight .  Patient has had an eye exam in the last 12 months and checks feet regularly for signs of infection.  Patient does not walk barefoot outside,  And denies an numbness tingling or burning in feet. Patient is up to date on all recommended vaccinations   BMI 36 unchanged .  a1c and lipids excellent.  Lab Results  Component Value Date   HGBA1C 6.4 09/14/2017   Discussed recent evaluation for UTi in April by [facility].  UTI ruled out despite abnormal POCT UA and symptoms of dysuria.    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 Pt showed her how to use bands  To improve right shoulder pain .   Sleeping well without meds  Taking the mega dose of Vit D    Foot exam normal   Worried about mother Dicky Doe; cighitive  changes noticed,  She is repeating herself  A lot   Outpatient Medications Prior to Visit  Medication Sig Dispense Refill  . aspirin 81 MG tablet Take 81 mg by mouth daily.    . Cholecalciferol (VITAMIN D-3 PO) Take 2,000 Int'l Units by mouth daily.    . ergocalciferol (DRISDOL) 50000 units capsule Take 1 capsule (50,000 Units total) by mouth once a week. 4 capsule 2  . fluticasone (FLONASE) 50 MCG/ACT nasal spray Place 2 sprays into both nostrils daily. (Patient taking differently: Place 2 sprays into both nostrils daily as needed. ) 16 g 2  . hyaluronate sodium (RADIAPLEXRX) GEL Apply 1 application topically 2 (two) times daily.    Marland Kitchen lisinopril-hydrochlorothiazide (PRINZIDE,ZESTORETIC) 20-12.5 MG tablet Take 1 tablet by mouth daily. 90 tablet 1  . metFORMIN (GLUCOPHAGE) 500 MG tablet TAKE 1 TABLET BY MOUTH TWO  TIMES DAILY WITH MEALS 180 tablet 3  . non-metallic deodorant (ALRA) MISC Apply 1 application topically.    . simvastatin (ZOCOR) 40 MG tablet TAKE 1 TABLET BY MOUTH  DAILY 90 tablet 3  . triamcinolone cream (KENALOG) 0.1 % Apply topically 2 (two) times daily. For itchy rash 30 g 1   No facility-administered medications prior to visit.     Review of Systems;  Patient denies headache, fevers, malaise, unintentional weight loss, skin rash, eye pain, sinus congestion and sinus pain, sore throat, dysphagia,  hemoptysis , cough, dyspnea, wheezing, chest pain, palpitations, orthopnea, edema, abdominal pain, nausea, melena, diarrhea, constipation, flank pain, dysuria, hematuria, urinary  Frequency, nocturia, numbness, tingling, seizures,  Focal weakness, Loss of consciousness,  Tremor, insomnia, depression, anxiety, and suicidal ideation.      Objective:  BP (!) 110/58 (BP Location: Left Arm, Patient Position: Sitting, Cuff Size: Normal)   Pulse 81   Temp 98.5 F (36.9 C) (Oral)   Resp 14   Ht 5\' 2"  (1.575 m)   Wt 200 lb (90.7 kg)   SpO2 98%   BMI 36.58 kg/m   BP Readings from  Last 3 Encounters:  09/19/17 (!) 110/58  08/21/17 122/62  06/14/17 108/68    Wt Readings from Last 3 Encounters:  09/19/17 200 lb (90.7 kg)  08/21/17 201 lb (91.2 kg)  06/14/17 200 lb 6.4 oz (90.9 kg)    General appearance: alert, cooperative and appears stated age Ears: normal TM's and external ear canals both ears Throat: lips, mucosa, and tongue normal; teeth and gums normal Neck: no adenopathy, no carotid bruit, supple, symmetrical, trachea midline and thyroid not enlarged, symmetric, no tenderness/mass/nodules Back: symmetric, no curvature. ROM normal. No CVA tenderness. Lungs: clear to auscultation bilaterally Heart: regular rate and rhythm, S1, S2 normal, no murmur, click, rub or gallop Abdomen: soft, non-tender; bowel sounds normal; no masses,  no organomegaly Pulses: 2+ and symmetric Skin: Skin color, texture, turgor normal. No rashes or lesions Lymph nodes: Cervical, supraclavicular, and axillary nodes normal.  Lab Results  Component Value Date   HGBA1C 6.4 09/14/2017   HGBA1C 6.6 (H) 06/07/2017   HGBA1C 6.5 10/24/2016    Lab Results  Component Value Date   CREATININE 1.05 09/14/2017   CREATININE 1.03 06/07/2017   CREATININE 1.07 (H) 05/14/2017    Lab Results  Component Value Date   WBC 8.8 07/20/2016   HGB 12.1 07/20/2016   HCT 36.9 07/20/2016   PLT 323.0 07/20/2016   GLUCOSE 115 (H) 09/14/2017   CHOL 160 09/14/2017   TRIG 86.0 09/14/2017   HDL 59.70 09/14/2017   LDLDIRECT 71.0 09/14/2017   LDLCALC 83 09/14/2017   ALT 8 09/14/2017   AST 12 09/14/2017   NA 140 09/14/2017   K 4.6 09/14/2017   CL 104 09/14/2017   CREATININE 1.05 09/14/2017   BUN 19 09/14/2017   CO2 30 09/14/2017   TSH 1.20 06/07/2017   HGBA1C 6.4 09/14/2017   MICROALBUR 2.8 (H) 02/21/2016    No results found.  Assessment & Plan:   Problem List Items Addressed This Visit    Diabetes mellitus type 2, controlled ([place])    Historically well-controlled on current medications.   hemoglobin A1c has been consistently at or  less than 7.0 . Patient is up-to-date on eye exams and foot exam is unchanged today. Patient  Has NOT had  urine microalbumin to creatinine ratio done within the past year. Patient is tolerating statin therapy for CAD risk reduction and on ACE/ARB for reduction in proteinuria.   Lab Results  Component Value Date   HGBA1C 6.4 09/14/2017   Lab Results  Component Value Date   MICROALBUR 2.8 (H) 02/21/2016         Relevant Orders   Microalbumin / creatinine urine ratio   Essential hypertension, benign    Well controlled on current regimen. Renal function stable, no changes today. Lab Results  Component Value Date   CREATININE 1.05 09/14/2017   Lab Results  Component Value Date   NA 140 09/14/2017  K 4.6 09/14/2017   CL 104 09/14/2017   CO2 30 09/14/2017         Hyperlipidemia    .LDL and triglycerides are at goal on current medications. SHE has no side effects and liver enzymes are normal. No changes today   Lab Results  Component Value Date   CHOL 160 09/14/2017   HDL 59.70 09/14/2017   LDLCALC 83 09/14/2017   LDLDIRECT 71.0 09/14/2017   TRIG 86.0 09/14/2017   CHOLHDL 3 09/14/2017   Lab Results  Component Value Date   ALT 8 09/14/2017   AST 12 09/14/2017   ALKPHOS 91 09/14/2017   BILITOT 0.4 09/14/2017         Obesity, unspecified (Chronic)    I have addressed  BMI and recommended a low glycemic index diet utilizing smaller more frequent meals to increase metabolism.  I have also recommended that patient start exercising with a goal of 30 minutes of aerobic exercise a minimum of 5 days per week.         A total of 25 minutes of face to face time was spent with patient more than half of which was spent in counselling and coordination of care   I am having Ms. Dafoe maintain her aspirin, Cholecalciferol (VITAMIN D-3 PO), fluticasone, triamcinolone cream, hyaluronate sodium, non-metallic deodorant, metFORMIN,  simvastatin, ergocalciferol, and lisinopril-hydrochlorothiazide.  No orders of the defined types were placed in this encounter.   There are no discontinued medications.  Follow-up: Return in about 6 months (around 03/19/2018) for follow up diabetes. fasting labs prior to visit .   [name], MD

## 2017-09-20 ENCOUNTER — Ambulatory Visit: Payer: Medicare Other

## 2017-09-22 NOTE — Assessment & Plan Note (Signed)
Well controlled on current regimen. Renal function stable, no changes today. Lab Results  Component Value Date   CREATININE 1.05 09/14/2017   Lab Results  Component Value Date   NA 140 09/14/2017   K 4.6 09/14/2017   CL 104 09/14/2017   CO2 30 09/14/2017

## 2017-09-22 NOTE — Assessment & Plan Note (Signed)
.  LDL and triglycerides are at goal on current medications. SHE has no side effects and liver enzymes are normal. No changes today   Lab Results  Component Value Date   CHOL 160 09/14/2017   HDL 59.70 09/14/2017   LDLCALC 83 09/14/2017   LDLDIRECT 71.0 09/14/2017   TRIG 86.0 09/14/2017   CHOLHDL 3 09/14/2017   Lab Results  Component Value Date   ALT 8 09/14/2017   AST 12 09/14/2017   ALKPHOS 91 09/14/2017   BILITOT 0.4 09/14/2017

## 2017-09-22 NOTE — Assessment & Plan Note (Signed)
Historically well-controlled on current medications.  hemoglobin A1c has been consistently at or  less than 7.0 . Patient is up-to-date on eye exams and foot exam is unchanged today. Patient  Has NOT had  urine microalbumin to creatinine ratio done within the past year. Patient is tolerating statin therapy for CAD risk reduction and on ACE/ARB for reduction in proteinuria.   Lab Results  Component Value Date   HGBA1C 6.4 09/14/2017   Lab Results  Component Value Date   MICROALBUR 2.8 (H) 02/21/2016

## 2017-09-22 NOTE — Assessment & Plan Note (Signed)
I have addressed  BMI and recommended a low glycemic index diet utilizing smaller more frequent meals to increase metabolism.  I have also recommended that patient start exercising with a goal of 30 minutes of aerobic exercise a minimum of 5 days per week.  

## 2017-10-02 ENCOUNTER — Ambulatory Visit (INDEPENDENT_AMBULATORY_CARE_PROVIDER_SITE_OTHER): Payer: Medicare Other

## 2017-10-02 VITALS — BP 120/70 | HR 67 | Temp 98.3°F | Resp 14 | Ht 62.5 in | Wt 202.4 lb

## 2017-10-02 DIAGNOSIS — Z0000 Encounter for general adult medical examination without abnormal findings: Secondary | ICD-10-CM

## 2017-10-02 DIAGNOSIS — Z1331 Encounter for screening for depression: Secondary | ICD-10-CM

## 2017-10-02 NOTE — Patient Instructions (Addendum)
  Ms. Woodring , Thank you for taking time to come for your Medicare Wellness Visit. I appreciate your ongoing commitment to your health goals. Please review the following plan we discussed and let me know if I can assist you in the future.   Follow up with Dr. Derrel Nip as needed.    Bring a copy of your [place] and/or Living Will to be scanned into chart once completed.  Have a great day!  These are the goals we discussed: Goals    . Increase lean proteins          Low carb foods    . Increase physical activity          Maintain stretching at home Increase exercise as tolerated    . Increase water intake       This is a list of the screening recommended for you and due dates:  Health Maintenance  Topic Date Due  . Eye exam for diabetics  09/06/2017  . Colon Cancer Screening  07/27/2020*  . Complete foot exam   10/16/2017  . Hemoglobin A1C  03/14/2018  . Pneumonia vaccines (2 of 2 - PPSV23) 08/25/2018  . Mammogram  10/03/2018  . Tetanus Vaccine  07/25/2019  . Flu Shot  Completed  . DEXA scan (bone density measurement)  Completed  *Topic was postponed. The date shown is not the original due date.

## 2017-10-02 NOTE — Progress Notes (Signed)
Subjective:   Martha [name] is a 66 y.o. female who presents for Medicare Annual (Subsequent) preventive examination.  Review of Systems:  No ROS.  Medicare Wellness Visit. Additional risk factors are reflected in the social history.  Cardiac Risk Factors include: hypertension;diabetes mellitus;obesity (BMI >30kg/m2)     Objective:     Vitals: BP 120/70 (BP Location: Left Arm, Patient Position: Sitting, Cuff Size: Normal)   Pulse 67   Temp 98.3 F (36.8 C) (Oral)   Resp 14   Ht 5' 2.5" (1.588 m)   Wt 202 lb 6.4 oz (91.8 kg)   SpO2 95%   BMI 36.43 kg/m   Body mass index is 36.43 kg/m.   Tobacco History  Smoking Status  . Former Smoker  . Packs/day: 0.25  . Years: 5.00  . Types: Cigarettes  . Quit date: 07/24/1990  Smokeless Tobacco  . Never Used     Counseling given: Not Answered   Past Medical History:  Diagnosis Date  . Arthritis   . 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 Cancer ([place]) 04/2017   right breast cancer  . Colon polyp 2008  . Diabetes mellitus without complication ([place])   . Endometrial hyperplasia 2013  . GERD (gastroesophageal reflux disease)    OCC-NO MEDS  . Headache    H/O MIGRAINES  . Hypertension   . Papilloma 11/20/2016   right breast  . Status post dilation of esophageal narrowing 2008   Past Surgical History:  Procedure Laterality Date  . BREAST BIOPSY Right 11/02/2016   ATYPICAL PAPILLARY LESION,  . BREAST EXCISIONAL BIOPSY Right 11/20/2016   AHD  . BREAST LUMPECTOMY Right 11/20/2016   Procedure: BREAST LUMPECTOMY;  Surgeon: [name], MD;  Location: [facility] ORS;  Service: General;  Laterality: Right;  . COLONOSCOPY  2008   in [place]  . DILATION AND CURETTAGE OF UTERUS    . RADIOACTIVE SEED GUIDED MASTECTOMY WITH AXILLARY SENTINEL LYMPH NODE BIOPSY Right 05/16/2017   Procedure: RIGHT RADIOACTIVE SEED GUIDED LUMPECTOMY  WITH  RIGHT BREAST SEED GUIDED EXCISIONAL BIOPSY AND RIGHT AXILLARY  SENTINEL  NODE BIOPSY;  Surgeon: [name], MD;  Location: [facility];  Service: General;  Laterality: Right;  2 SEEDS RIGHT BREAST  GENERAL AND PEC BLOCK ANESHTESIA   Family History  Problem Relation Age of Onset  . Hypertension Mother   . Heart disease Father 77  . Hypertension Daughter   . Cancer Maternal Aunt 80       breast and ovary  . Pancreatic cancer Maternal Aunt   . Lymphoma Maternal Aunt    History  Sexual Activity  . Sexual activity: Not Currently    Outpatient Encounter Prescriptions as of 10/02/2017  Medication Sig  . aspirin 81 MG tablet Take 81 mg by mouth daily.  . Cholecalciferol (VITAMIN D-3 PO) Take 2,000 Int'l Units by mouth daily.  . ergocalciferol (DRISDOL) 50000 units capsule Take 1 capsule (50,000 Units total) by mouth once a week.  . fluticasone (FLONASE) 50 MCG/ACT nasal spray Place 2 sprays into both nostrils daily. (Patient taking differently: Place 2 sprays into both nostrils daily as needed. )  . hyaluronate sodium (RADIAPLEXRX) GEL Apply 1 application topically 2 (two) times daily.  Marland Kitchen lisinopril-hydrochlorothiazide (PRINZIDE,ZESTORETIC) 20-12.5 MG tablet Take 1 tablet by mouth daily.  . metFORMIN (GLUCOPHAGE) 500 MG tablet TAKE 1 TABLET BY MOUTH TWO  TIMES DAILY WITH MEALS  . non-metallic deodorant (ALRA) MISC Apply 1 application topically.  Marland Kitchen  simvastatin (ZOCOR) 40 MG tablet TAKE 1 TABLET BY MOUTH  DAILY  . triamcinolone cream (KENALOG) 0.1 % Apply topically 2 (two) times daily. For itchy rash   No facility-administered encounter medications on file as of 10/02/2017.     Activities of Daily Living In your present state of health, do you have any difficulty performing the following activities: 10/02/2017 05/16/2017  Hearing? N N  Vision? N N  Difficulty concentrating or making decisions? N N  Walking or climbing stairs? N N  Dressing or bathing? N N  Doing errands, shopping? N -  Preparing Food and eating ? N -  Using the  Toilet? N -  In the past six months, have you accidently leaked urine? N -  Do you have problems with loss of bowel control? N -  Managing your Medications? N -  Managing your Finances? N -  Housekeeping or managing your Housekeeping? N -  Some recent data might be hidden    Patient Care Team: [name], MD as PCP - General (Internal Medicine) [name], MD (General Surgery)    Assessment:    This is a routine wellness examination for [name]. The goal of the wellness visit is to assist the patient how to close the gaps in care and create a preventative care plan for the patient.   The roster of all physicians providing medical care to patient is listed in the Snapshot section of the chart.  Taking calcium VIT D as appropriate/Osteoporosis risk reviewed.    Safety issues reviewed; Smoke and carbon monoxide detectors in the home. No firearms in the home.  Wears seatbelts when driving or riding with others. Patient does wear sunscreen or protective clothing when in direct sunlight. No violence in the home.  Depression- PHQ 2 &9 complete.  No signs/symptoms or verbal communication regarding little pleasure in doing things, feeling down, depressed or hopeless. No changes in sleeping, energy, eating, concentrating.  No thoughts of self harm or harm towards others.  Time spent on this topic is 8 minutes.   Patient is alert, normal appearance, oriented to person/place/and time.  Correctly identified the president of the [place], recall of 3/3 words, and performing simple calculations. Displays appropriate judgement and can read correct time from watch face.   No new identified risk were noted.  No failures at ADL's or IADL's.    BMI- discussed the importance of a healthy diet, water intake and the benefits of aerobic exercise. Educational material provided.   24 hour diet recall: Breakfast: oatmeal Lunch: none Dinner: shrimp, vegetable green Snack: fruit Daily fluid  intake: 1 cups of caffeine,  cups of water  Eye- Visual acuity not assessed per patient preference since they have regular follow up with the ophthalmologist.  Wears corrective lenses.  Sleep patterns- Sleeps 6 hours at night.  Wakes feeling rested.  Health maintenance gaps- closed.  Patient Concerns: None at this time. Follow up with PCP as needed.  Exercise Activities and Dietary recommendations Current Exercise Habits: Home exercise routine, Type of exercise: stretching (Bed/chair/standing), Intensity: Moderate  Goals    . Increase lean proteins          Low carb foods    . Increase physical activity          Maintain stretching at home Increase exercise as tolerated    . Increase water intake      Fall Risk Fall Risk  10/02/2017 08/21/2017 05/29/2017 09/20/2016 07/20/2016  Falls in the past year?  No No No No No   Depression Screen PHQ 2/9 Scores 10/02/2017 09/20/2016 07/20/2016  PHQ - 2 Score 0 0 0  PHQ- 9 Score 0 - -     Cognitive Function MMSE - Mini Mental State Exam 10/02/2017 09/20/2016  Orientation to time 5 5  Orientation to Place 5 5  Registration 3 3  Attention/ Calculation 5 5  Recall 3 3  Language- name 2 objects 2 2  Language- repeat 1 1  Language- follow 3 step command 3 3  Language- read & follow direction 1 1  Write a sentence 1 1  Copy design 1 1  Total score 30 30        Immunization History  Administered Date(s) Administered  . Influenza Split 10/15/2014  . Influenza,inj,Quad PF,6+ Mos 09/20/2016  . Influenza-Unspecified 10/24/2012, 10/12/2013  . Pneumococcal Conjugate-13 06/21/2015, 08/25/2017  . Pneumococcal Polysaccharide-23 10/24/2012  . Tdap 07/24/2009   Screening Tests Health Maintenance  Topic Date Due  . OPHTHALMOLOGY EXAM  09/06/2017  . COLONOSCOPY  07/27/2020 (Originally 07/24/2017)  . FOOT EXAM  10/16/2017  . HEMOGLOBIN A1C  03/14/2018  . PNA vac Low Risk Adult (2 of 2 - PPSV23) 08/25/2018  . MAMMOGRAM  10/03/2018  .  TETANUS/TDAP  07/25/2019  . INFLUENZA VACCINE  Completed  . DEXA SCAN  Completed      Plan:   End of life planning; Advanced aging; Advanced directives discussed.  No HCPOA/Living Will.  Additional information provided to help them start the conversation with family.  Copy of HCPOA/Living Will requested upon completion. Time spent on this topic is 25 minutes.  I have personally reviewed and noted the following in the patient's chart:   . Medical and social history . Use of alcohol, tobacco or illicit drugs  . Current medications and supplements . Functional ability and status . Nutritional status . Physical activity . Advanced directives . List of other physicians . Hospitalizations, surgeries, and ER visits in previous 12 months . Vitals . Screenings to include cognitive, depression, and falls . Referrals and appointments  In addition, I have reviewed and discussed with patient certain preventive protocols, quality metrics, and best practice recommendations. A written personalized care plan for preventive services as well as general preventive health recommendations were provided to patient.     OBrien-Blaney, Malaina Mortellaro L, LPN  57/02/2201    I have reviewed the above information and agree with above.   Deborra Medina, MD

## 2017-10-08 ENCOUNTER — Encounter: Payer: Self-pay | Admitting: Hematology and Oncology

## 2017-10-18 NOTE — Progress Notes (Signed)
CLINIC:  Survivorship   REASON FOR VISIT:  Routine follow-up post-treatment for a recent history of breast cancer.  BRIEF ONCOLOGIC HISTORY:    Malignant neoplasm of upper-outer quadrant of right breast in female, estrogen receptor positive ([place])   04/09/2017 Mammogram    Right breast mass 9:00 position 1 cm from nipple: 4 mm; small asymmetry in the upper outer quadrant measuring 1 cm      04/20/2017 Initial Diagnosis    Right breast stereotactic biopsy: IDC with DCIS, biopsy 9:00 position: [facility], ER 100%, PR 100% HER-2 pending      05/16/2017 Surgery    Right lumpectomy: IDC grade 1, microscopic focus, low-grade DCIS, 0/3 lymph nodes negative, Atypical lobular hyperplasia, ER 100%, PR 100%, HER-2 negative ratio 1.63, Ki-67 insufficient, T36mc, N0 stage IA      06/18/2017 -  Radiation Therapy    Adjuvant radiation therapy      09/2017 -  Anti-estrogen oral therapy    Anastrozole daily       INTERVAL HISTORY:  [name] meeting to review her survivorship care plan detailing her treatment course for breast cancer, as well as monitoring long-term side effects of that treatment, education regarding health maintenance, screening, and overall wellness and health promotion.     Overall, Ms. SNarvaezreports feeling quite well since completing her radiation therapy.  She does have some mild right breast swelling.  She has not yet started on anti estrogen therapy.      REVIEW OF SYSTEMS:  Review of Systems  Constitutional: Negative for appetite change, chills, fatigue, fever and unexpected weight change.  HENT:   Negative for hearing loss and lump/mass.   Eyes: Negative for eye problems and icterus.  Respiratory: Negative for chest tightness, cough and shortness of breath.   Cardiovascular: Negative for chest pain, leg swelling and palpitations.  Gastrointestinal: Negative for abdominal distention and abdominal pain.    Endocrine: Negative for hot flashes.  Genitourinary: Negative for difficulty urinating.   Musculoskeletal: Negative for arthralgias.  Skin: Negative for itching and rash.  Neurological: Negative for dizziness, extremity weakness, headaches and numbness.  Hematological: Negative for adenopathy. Does not bruise/bleed easily.  Psychiatric/Behavioral: Negative for depression. The patient is not nervous/anxious.        ONCOLOGY TREATMENT TEAM:  1. Surgeon:  [facility]ne [facility] 2. Medical Oncologist: Dr. GLindi Adie 3. Radiation Oncologist: Dr. MLisbeth Renshaw   PAST MEDICAL/SURGICAL HISTORY:  Past Medical History:  Diagnosis Date  . Arthritis   . 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 Cancer ([place] 04/2017   right breast cancer  . Colon polyp 2008  . Diabetes mellitus without complication ([place]   . Endometrial hyperplasia 2013  . GERD (gastroesophageal reflux disease)    OCC-NO MEDS  . Headache    H/O MIGRAINES  . Hypertension   . Papilloma 11/20/2016   right breast  . Status post dilation of esophageal narrowing 2008   Past Surgical History:  Procedure Laterality Date  . BREAST BIOPSY Right 11/02/2016   ATYPICAL PAPILLARY LESION,  . BREAST EXCISIONAL BIOPSY Right 11/20/2016   AHD  . BREAST LUMPECTOMY Right 11/20/2016   Procedure: BREAST LUMPECTOMY;  Surgeon: SChristene Lye MD;  Location: [facility] ORS;  Service: General;  Laterality: Right;  . [place] AND [place]URE[place] OF [place]TER[place]    . RADIOACTIVE SEED GUIDED MASTECTOMY WITH AXILLARY SENTINEL  LYMPH NODE BIOPSY Right 05/16/2017   Procedure: RIGHT RADIOACTIVE SEED GUIDED LUMPECTOMY  WITH  RIGHT BREAST SEED GUIDED EXCISIONAL BIOPSY AND RIGHT AXILLARY SENTINEL  NODE BIOPSY;  Surgeon: [name], MD;  Location: [place];  Service: General;  Laterality: Right;  2 SEEDS RIGHT BREAST  GENERAL AND PEC BLOCK ANESHTESIA     ALLERGIES:  No  Known Allergies   CURRENT MEDICATIONS:  Outpatient Encounter Prescriptions as of 10/19/2017  Medication Sig Note  . aspirin 81 MG tablet Take 81 mg by mouth daily.   . Cholecalciferol (VITAMIN D-3 PO) Take 2,000 Int'l Units by mouth daily.   . ergocalciferol (DRISDOL) 50000 units capsule Take 1 capsule (50,000 Units total) by mouth once a week.   Marland Kitchen lisinopril-hydrochlorothiazide (PRINZIDE,ZESTORETIC) 20-12.5 MG tablet Take 1 tablet by mouth daily.   . metFORMIN (GLUCOPHAGE) 500 MG tablet TAKE 1 TABLET BY MOUTH TWO  TIMES DAILY WITH MEALS   . non-metallic deodorant (ALRA) MISC Apply 1 application topically.   . simvastatin (ZOCOR) 40 MG tablet TAKE 1 TABLET BY MOUTH  DAILY   . anastrozole (ARIMIDEX) 1 MG tablet Take 1 tablet (1 mg total) by mouth daily. (Patient not taking: Reported on 10/19/2017)   . fluticasone (FLONASE) 50 MCG/ACT nasal spray Place 2 sprays into both nostrils daily. (Patient not taking: Reported on 10/19/2017)   . hyaluronate sodium (RADIAPLEXRX) GEL Apply 1 application topically 2 (two) times daily. 06/29/2017: radiaplex given 06/29/17  . triamcinolone cream (KENALOG) 0.1 % Apply topically 2 (two) times daily. For itchy rash (Patient not taking: Reported on 10/19/2017)    No facility-administered encounter medications on file as of 10/19/2017.      ONCOLOGIC FAMILY HISTORY:  Family History  Problem Relation Age of Onset  . Hypertension Mother   . Heart disease Father 107  . Hypertension Daughter   . Cancer Maternal Aunt 80       breast and ovary  . Pancreatic cancer Maternal Aunt   . Lymphoma Maternal Aunt      GENETIC COUNSELING/TESTING: Not at this time  SOCIAL HISTORY:  Martha [name] is married and lives with her husband in [place], [place].  She has 4 children and they live in [place], [place], and [place].  Martha [name] is currently retired.  She does have an interview for a part time job at [employer] for the holidays.  She denies any current or history of  tobacco, alcohol, or illicit drug use.     PHYSICAL EXAMINATION:  Vital Signs:   Vitals:   10/19/17 1028  BP: 133/61  Pulse: 76  Resp: 18  Temp: 98 F (36.7 C)  SpO2: 100%   Filed Weights   10/19/17 1028  Weight: 200 lb 1.6 oz (90.8 kg)   General: Well-nourished, well-appearing female in no acute distress.  She is unaccompanied today.   HEENT: Head is normocephalic.  Pupils equal and reactive to light. Conjunctivae clear without exudate.  Sclerae anicteric. Oral mucosa is pink, moist.  Oropharynx is pink without lesions or erythema.  Lymph: No cervical, supraclavicular, or infraclavicular lymphadenopathy noted on palpation.  Cardiovascular: Regular rate and rhythm.Marland Kitchen Respiratory: Clear to auscultation bilaterally. Chest expansion symmetric; breathing non-labored.  Breasts: right breast mildly swollen, some thickening s/p radiation, mild amt of scar tissue on lumpectomy site.  No nodules, masses or nay other abnormalities noted.  Left breast benign. GI: Abdomen soft and round; non-tender, non-distended. Bowel sounds normoactive.  GU: Deferred.  Neuro: No focal deficits. Steady gait.  Psych: Mood and affect normal and appropriate for situation.  Extremities: No edema. MSK: No focal spinal tenderness to palpation.  Full range of motion in bilateral upper extremities Skin: Warm and dry.  LABORATORY DATA:  None for this visit.  DIAGNOSTIC IMAGING:  None for this visit.      ASSESSMENT AND PLAN:  Ms.. [name] is a pleasant 66 y.o. female with Stage IA right breast invasive ductal carcinoma, ER+/PR+/HER2-, diagnosed in 03/2017, treated with lumpectomy, adjuvant radiation therapy, and anti-estrogen therapy with Anastrozole starting in 09/2017.  She presents to the Survivorship Clinic for our initial meeting and routine follow-up post-completion of treatment for breast cancer.    1. Stage IA right breast cancer:  Martha [name] is continuing to recover from definitive treatment for breast  cancer. She will follow-up with her medical oncologist, Dr. Lindi Adie in 3 months with history and physical exam per surveillance protocol.  She has not yet started on Anastrozole, however it is a recommendation by Dr. Lindi Adie.  I reviewed this with the patient today, and we discussed risks and benefits in detail.  She will start today.  Today, a comprehensive survivorship care plan and treatment summary was reviewed with the patient today detailing her breast cancer diagnosis, treatment course, potential late/long-term effects of treatment, appropriate follow-up care with recommendations for the future, and patient education resources.  A copy of this summary, along with a letter will be sent to the patient's primary care provider via mail/fax/In Basket message after today's visit.    2. Breast swelling: Recommended she apply coconut oil and massage.  If doesn't improve, would recommend eval with PT.    3. Bone health:  Given Martha [name]'s age/history of breast cancer and her current treatment regimen including anti-estrogen therapy with Anastrozole, she is at risk for bone demineralization.  Her last DEXA scan was in 07/2015 and demonstrated a normal bone density. In the meantime, she was encouraged to increase her consumption of foods rich in calcium, as well as increase her weight-bearing activities.  She was given education on specific activities to promote bone health.  4. Cancer screening:  Due to Martha [name]'s history and her age, she should receive screening for skin cancers, colon cancer, and gynecologic cancers.  The information and recommendations are listed on the patient's comprehensive care plan/treatment summary and were reviewed in detail with the patient.    5. Health maintenance and wellness promotion: Martha [name] was encouraged to consume 5-7 servings of fruits and vegetables per day. We reviewed the "Nutrition Rainbow" handout, as well as the handout "Take Control of Your Health and Reduce Your  Cancer Risk" from the [place].  She was also encouraged to engage in moderate to vigorous exercise for 30 minutes per day most days of the week. We discussed the [employer] fitness program, which is designed for cancer survivors to help them become more physically fit after cancer treatments.  She was instructed to limit her alcohol consumption and continue to abstain from tobacco use.     6. Support services/counseling: It is not uncommon for this period of the patient's cancer care trajectory to be one of many emotions and stressors.  We discussed an opportunity for her to participate in the next session of [facility] ("Finding Your New Normal") support group series designed for patients after they have completed treatment.   Martha [name] was encouraged to take advantage of our many other support services programs, support groups, and/or counseling in coping with her new  life as a cancer survivor after completing anti-cancer treatment.  She was offered support today through active listening and expressive supportive counseling.  She was given information regarding our available services and encouraged to contact me with any questions or for help enrolling in any of our support group/programs.    Dispo:   -Return to cancer center for follow up with Dr. Lindi Adie in 3 months -Mammogram due in 03/2018 -Bone density in 03/2018 -Follow up with Dr. Donne Hazel in 06/2018 -She is welcome to return back to the Survivorship Clinic at any time; no additional follow-up needed at this time.  -Consider referral back to survivorship as a long-term survivor for continued surveillance  A total of (30) minutes of face-to-face time was spent with this patient with greater than 50% of that time in counseling and care-coordination.   Gardenia Phlegm, NP Survivorship Program [facility] [phone]   Note: PRIMARY CARE PROVIDER [name], [place] [phone]

## 2017-10-19 ENCOUNTER — Telehealth: Payer: Self-pay | Admitting: Hematology and Oncology

## 2017-10-19 ENCOUNTER — Encounter: Payer: Self-pay | Admitting: Adult Health

## 2017-10-19 ENCOUNTER — Ambulatory Visit (HOSPITAL_BASED_OUTPATIENT_CLINIC_OR_DEPARTMENT_OTHER): Payer: Medicare Other | Admitting: Adult Health

## 2017-10-19 VITALS — BP 133/61 | HR 76 | Temp 98.0°F | Resp 18 | Ht 62.5 in | Wt 200.1 lb

## 2017-10-19 DIAGNOSIS — C50411 Malignant neoplasm of upper-outer quadrant of right female breast: Secondary | ICD-10-CM | POA: Diagnosis not present

## 2017-10-19 DIAGNOSIS — N63 Unspecified lump in unspecified breast: Secondary | ICD-10-CM

## 2017-10-19 DIAGNOSIS — Z170 Estrogen receptor positive status [ER+]: Secondary | ICD-10-CM

## 2017-10-19 DIAGNOSIS — E2839 Other primary ovarian failure: Secondary | ICD-10-CM

## 2017-10-19 MED ORDER — ANASTROZOLE 1 MG PO TABS: 1.0000 mg | ORAL_TABLET | Freq: Every day | ORAL | 5 refills | Status: DC

## 2017-10-19 NOTE — Telephone Encounter (Signed)
Gave patient AVS and calendar of upcoming 12/2017 appointments.

## 2017-10-22 ENCOUNTER — Encounter: Payer: Self-pay | Admitting: Hematology and Oncology

## 2017-10-23 ENCOUNTER — Other Ambulatory Visit: Payer: Self-pay | Admitting: Adult Health

## 2017-10-23 ENCOUNTER — Encounter: Payer: Self-pay | Admitting: Adult Health

## 2017-10-23 DIAGNOSIS — Z170 Estrogen receptor positive status [ER+]: Principal | ICD-10-CM

## 2017-10-23 DIAGNOSIS — C50411 Malignant neoplasm of upper-outer quadrant of right female breast: Secondary | ICD-10-CM

## 2017-10-23 MED ORDER — ANASTROZOLE 1 MG PO TABS: 1.0000 mg | ORAL_TABLET | Freq: Every day | ORAL | 5 refills | Status: DC

## 2017-11-03 ENCOUNTER — Other Ambulatory Visit: Payer: Self-pay | Admitting: Internal Medicine

## 2017-12-14 ENCOUNTER — Telehealth: Payer: Self-pay | Admitting: Internal Medicine

## 2017-12-14 NOTE — Telephone Encounter (Signed)
Copied from [id number] [id number]. Topic: Quick Communication - See Telephone Encounter >> Dec 14, 2017  3:56 PM Aurelio Brash B wrote: CRM for notification. See Telephone encounter for:  Pt states Dr Derrel Nip told her to take Vitamin D for 3 months however the pharmacy is still filling th rx,  she wants to know if she is suppose to still take this 12/14/17.

## 2017-12-14 NOTE — Telephone Encounter (Signed)
Yes she can continue it  For 3 more months through the winter.

## 2017-12-14 NOTE — Telephone Encounter (Signed)
Please advise 

## 2017-12-17 NOTE — Telephone Encounter (Signed)
[place] for [facility] to let patient to know the following instructions.

## 2017-12-17 NOTE — Telephone Encounter (Signed)
She should be fine to take the aleve in the short term. She does not appear to have any renal dysfunction or gastric ulcer issues. She should take it with food. If she takes this on a consistent basis she will need to have her renal function repeated in the next several weeks. She should also mention her aches to her oncologist if she has not already.

## 2017-12-17 NOTE — Telephone Encounter (Signed)
OK to continue the Aleve while taking the Arimidex?

## 2017-12-17 NOTE — Telephone Encounter (Signed)
Patient aware.

## 2017-12-17 NOTE — Telephone Encounter (Signed)
Martha [name] called with a question regarding a medication, Arimidex, which has been prescribed by her Oncologist, Dr.Gudena. She has begun to have joint aches which she was told would probably occur. She takes Aleeve once a day for the aches and wants to make sure this is okay for her to continue taking for the joint aches on a daily basis? Please advise   FYI: Also, she wanted to report she had a fall in the yard last week which resulted an abrasion on her head.  EMS was called and she did not have to go to hospital.

## 2018-01-01 ENCOUNTER — Ambulatory Visit (INDEPENDENT_AMBULATORY_CARE_PROVIDER_SITE_OTHER): Payer: Medicare Other | Admitting: Internal Medicine

## 2018-01-01 ENCOUNTER — Encounter: Payer: Self-pay | Admitting: Internal Medicine

## 2018-01-01 VITALS — BP 146/72 | HR 69 | Temp 98.2°F | Resp 15 | Ht 62.5 in | Wt 203.8 lb

## 2018-01-01 DIAGNOSIS — Z79811 Long term (current) use of aromatase inhibitors: Secondary | ICD-10-CM | POA: Diagnosis not present

## 2018-01-01 DIAGNOSIS — E1121 Type 2 diabetes mellitus with diabetic nephropathy: Secondary | ICD-10-CM | POA: Diagnosis not present

## 2018-01-01 DIAGNOSIS — I10 Essential (primary) hypertension: Secondary | ICD-10-CM | POA: Diagnosis not present

## 2018-01-01 DIAGNOSIS — Z79899 Other long term (current) drug therapy: Secondary | ICD-10-CM | POA: Diagnosis not present

## 2018-01-01 DIAGNOSIS — E7800 Pure hypercholesterolemia, unspecified: Secondary | ICD-10-CM

## 2018-01-01 MED ORDER — ESCITALOPRAM OXALATE 5 MG PO TABS: 5.0000 mg | ORAL_TABLET | Freq: Every day | ORAL | 0 refills | Status: DC

## 2018-01-01 MED ORDER — ZOSTER VAC RECOMB ADJUVANTED 50 MCG/0.5ML IM SUSR: 0.5000 mL | Freq: Once | INTRAMUSCULAR | 1 refills | Status: AC

## 2018-01-01 NOTE — Progress Notes (Signed)
Subjective:  Patient ID: Martha [name], female    DOB: November 05, 1951  Age: 67 y.o. MRN: [id number]  CC: The primary encounter diagnosis was Controlled type 2 diabetes mellitus with diabetic nephropathy, without long-term current use of insulin ([place]). Diagnoses of Long-term use of high-risk medication, Essential hypertension, benign, Pure hypercholesterolemia, and Use of anastrozole (Arimidex) were also pertinent to this visit.  HPI Martha [name] presents for 3 month follow up on diabetes, hypertension, hyperlipidemia and obesity.  Patient has no complaints today.  Patient is following a low glycemic index diet and taking all prescribed medications regularly without side effects.  Fasting sugars have been under less than 140 most of the time and post prandials have been under 160 except on rare occasions. Patient is exercising about 3 times per week and intentionally trying to lose weight .  Patient has had an eye exam in the last 12 months and checks feet regularly for signs of infection.  Patient does not walk barefoot outside,  And denies an numbness tingling or burning in feet. Patient is up to date on all recommended vaccinations  History of breast cancer April 2018 DCIS s/p lumpectomy,  XRT and anastrole .  She has been experiencing aching bones,  Mood chpanges  And hot flashes.    She has stopped taking daily aleve  Had right caatract removed oct 2018,  Left one is planned with [facility]   [place]   Lab Results  Component Value Date   HGBA1C 6.5 01/01/2018   Lab Results  Component Value Date   CHOL 160 09/14/2017   HDL 59.70 09/14/2017   LDLCALC 83 09/14/2017   LDLDIRECT 71.0 09/14/2017   TRIG 86.0 09/14/2017   CHOLHDL 3 09/14/2017     Outpatient Medications Prior to Visit  Medication Sig Dispense Refill  . anastrozole (ARIMIDEX) 1 MG tablet Take 1 tablet (1 mg total) by mouth daily. 30 tablet 5  . aspirin 81 MG tablet Take 81 mg by mouth daily.    . fluticasone (FLONASE)  50 MCG/ACT nasal spray Place 2 sprays into both nostrils daily. 16 g 2  . lisinopril-hydrochlorothiazide (PRINZIDE,ZESTORETIC) 20-12.5 MG tablet Take 1 tablet by mouth daily. 90 tablet 1  . metFORMIN (GLUCOPHAGE) 500 MG tablet TAKE 1 TABLET BY MOUTH TWO  TIMES DAILY WITH MEALS 180 tablet 3  . non-metallic deodorant (ALRA) MISC Apply 1 application topically.    . simvastatin (ZOCOR) 40 MG tablet TAKE 1 TABLET BY MOUTH  DAILY 90 tablet 3  . triamcinolone cream (KENALOG) 0.1 % Apply topically 2 (two) times daily. For itchy rash 30 g 1  . Vitamin D, Ergocalciferol, (DRISDOL) 50000 units CAPS capsule TAKE ONE CAPSULE BY MOUTH ONE TIME PER WEEK 4 capsule 2  . Cholecalciferol (VITAMIN D-3 PO) Take 2,000 Int'l Units by mouth daily.    . hyaluronate sodium (RADIAPLEXRX) GEL Apply 1 application topically 2 (two) times daily.     No facility-administered medications prior to visit.     Review of Systems;  Patient denies headache, fevers, malaise, unintentional weight loss, skin rash, eye pain, sinus congestion and sinus pain, sore throat, dysphagia,  hemoptysis , cough, dyspnea, wheezing, chest pain, palpitations, orthopnea, edema, abdominal pain, nausea, melena, diarrhea, constipation, flank pain, dysuria, hematuria, urinary  Frequency, nocturia, numbness, tingling, seizures,  Focal weakness, Loss of consciousness,  Tremor, insomnia, depression, anxiety, and suicidal ideation.      Objective:  BP (!) 146/72 (BP Location: Left Arm, Patient Position: Sitting, Cuff Size: Normal)  Pulse 69   Temp 98.2 F (36.8 C) (Oral)   Resp 15   Ht 5' 2.5" (1.588 m)   Wt 203 lb 12.8 oz (92.4 kg)   SpO2 95%   BMI 36.68 kg/m   BP Readings from Last 3 Encounters:  01/01/18 (!) 146/72  10/19/17 133/61  10/02/17 120/70    Wt Readings from Last 3 Encounters:  01/01/18 203 lb 12.8 oz (92.4 kg)  10/19/17 200 lb 1.6 oz (90.8 kg)  10/02/17 202 lb 6.4 oz (91.8 kg)    General appearance: alert, cooperative and  appears stated age Ears: normal TM's and external ear canals both ears Throat: lips, mucosa, and tongue normal; teeth and gums normal Neck: no adenopathy, no carotid bruit, supple, symmetrical, trachea midline and thyroid not enlarged, symmetric, no tenderness/mass/nodules Back: symmetric, no curvature. ROM normal. No CVA tenderness. Lungs: clear to auscultation bilaterally Heart: regular rate and rhythm, S1, S2 normal, no murmur, click, rub or gallop Abdomen: soft, non-tender; bowel sounds normal; no masses,  no organomegaly Pulses: 2+ and symmetric Skin: Skin color, texture, turgor normal. No rashes or lesions Lymph nodes: Cervical, supraclavicular, and axillary nodes normal.  Lab Results  Component Value Date   HGBA1C 6.5 01/01/2018   HGBA1C 6.4 09/14/2017   HGBA1C 6.6 (H) 06/07/2017    Lab Results  Component Value Date   CREATININE 0.95 01/01/2018   CREATININE 1.05 09/14/2017   CREATININE 1.03 06/07/2017    Lab Results  Component Value Date   WBC 8.8 07/20/2016   HGB 12.1 07/20/2016   HCT 36.9 07/20/2016   PLT 323.0 07/20/2016   GLUCOSE 96 01/01/2018   CHOL 160 09/14/2017   TRIG 86.0 09/14/2017   HDL 59.70 09/14/2017   LDLDIRECT 71.0 09/14/2017   LDLCALC 83 09/14/2017   ALT 8 01/01/2018   AST 13 01/01/2018   NA 139 01/01/2018   K 4.6 01/01/2018   CL 101 01/01/2018   CREATININE 0.95 01/01/2018   BUN 18 01/01/2018   CO2 31 01/01/2018   TSH 1.20 06/07/2017   HGBA1C 6.5 01/01/2018   MICROALBUR 1.8 01/01/2018    No results found.  Assessment & Plan:   Problem List Items Addressed This Visit    Diabetes mellitus type 2, controlled ([place]) - Primary    Historically well-controlled on current medications.  hemoglobin A1c has been consistently at or  less than 7.0 . Patient is up-to-date on eye exams and foot exam is normal today. Patient  Has NO  Urinary microalbuminuria   Patient is tolerating statin therapy for CAD risk reduction and on ACE Inhibitor  for  reduction in proteinuria.   Lab Results  Component Value Date   HGBA1C 6.5 01/01/2018   Lab Results  Component Value Date   MICROALBUR 1.8 01/01/2018         Relevant Orders   Hemoglobin A1c (Completed)   Comprehensive metabolic panel (Completed)   Microalbumin / creatinine urine ratio (Completed)   Essential hypertension, benign    Well controlled on current regimen. Renal function is stable, no changes today. Lab Results  Component Value Date   CREATININE 0.95 01/01/2018   Lab Results  Component Value Date   NA 139 01/01/2018   K 4.6 01/01/2018   CL 101 01/01/2018   CO2 31 01/01/2018         Hyperlipidemia    .LDL and triglycerides are at goal on current medications. SHE has no side effects and liver enzymes are normal. No changes today  Lab Results  Component Value Date   CHOL 160 09/14/2017   HDL 59.70 09/14/2017   LDLCALC 83 09/14/2017   LDLDIRECT 71.0 09/14/2017   TRIG 86.0 09/14/2017   CHOLHDL 3 09/14/2017   Lab Results  Component Value Date   ALT 8 01/01/2018   AST 13 01/01/2018   ALKPHOS 95 01/01/2018   BILITOT 0.3 01/01/2018         Use of anastrozole (Arimidex)    She is experiencing adverse effects of anastrozole include mood changes and hot flashes.  We discussed a trial of lexapro 5 mg daily        Other Visit Diagnoses    Long-term use of high-risk medication       Relevant Orders   B12 (Completed)      I have discontinued Rogue Jury. Neils's Cholecalciferol (VITAMIN D-3 PO) and hyaluronate sodium. I am also having her start on escitalopram and Zoster Vaccine Adjuvanted. Additionally, I am having her maintain her aspirin, fluticasone, triamcinolone cream, non-metallic deodorant, metFORMIN, simvastatin, lisinopril-hydrochlorothiazide, anastrozole, and Vitamin D (Ergocalciferol).  Meds ordered this encounter  Medications  . escitalopram (LEXAPRO) 5 MG tablet    Sig: Take 1 tablet (5 mg total) by mouth daily.    Dispense:  90  tablet    Refill:  0  . Zoster Vaccine Adjuvanted [facility]) injection    Sig: Inject 0.5 mLs into the muscle once for 1 dose.    Dispense:  1 each    Refill:  1   A total of 25 minutes of face to face time was spent with patient more than half of which was spent in counselling and coordination of care    Medications Discontinued During This Encounter  Medication Reason  . hyaluronate sodium (RADIAPLEXRX) GEL Patient has not taken in last 30 days  . Cholecalciferol (VITAMIN D-3 PO) Patient has not taken in last 30 days    Follow-up: Return in about 4 months (around 05/01/2018) for fasting labs prior to next visit , follow up diabetes.   [name], MD

## 2018-01-01 NOTE — Patient Instructions (Signed)
  The ShingRx vaccine is now available in local pharmacies and is much more protective thant Zostavaxs,  It is therefore ADVISED for all interested adults over 50 to prevent shingles .  If your insurance covers it,  You can return her for it.  If not,  You can get if or less $$$ at a local pharmacy  You still need one more dose of the Pneumovax (pneumonia vaccine) next September   .trial of lexapro for your mood changes and hot flashes.  Start with 5 mg daily at dinner,  You can increase the dose in 2 weeks if needed.   Try turmeric for your joint pain ,  It is available  in capsules and safe to take

## 2018-01-02 LAB — COMPREHENSIVE METABOLIC PANEL
ALT: 8 U/L (ref 0–35)
AST: 13 U/L (ref 0–37)
Albumin: 4.2 g/dL (ref 3.5–5.2)
Alkaline Phosphatase: 95 U/L (ref 39–117)
BUN: 18 mg/dL (ref 6–23)
CO2: 31 mEq/L (ref 19–32)
Calcium: 9.8 mg/dL (ref 8.4–10.5)
Chloride: 101 mEq/L (ref 96–112)
Creatinine, Ser: 0.95 mg/dL (ref 0.40–1.20)
GFR: 75.33 mL/min (ref >60.00)
Glucose, Bld: 96 mg/dL (ref 70–99)
Potassium: 4.6 mEq/L (ref 3.5–5.1)
Sodium: 139 mEq/L (ref 135–145)
Total Bilirubin: 0.3 mg/dL (ref 0.2–1.2)
Total Protein: 7.2 g/dL (ref 6.0–8.3)

## 2018-01-02 LAB — MICROALBUMIN / CREATININE URINE RATIO
Creatinine,U: 59.3 mg/dL
Microalb Creat Ratio: 3 mg/g (ref 0.0–30.0)
Microalb, Ur: 1.8 mg/dL (ref 0.0–1.9)

## 2018-01-02 LAB — VITAMIN B12: Vitamin B-12: 263 pg/mL (ref 211–911)

## 2018-01-02 LAB — HEMOGLOBIN A1C: Hgb A1c MFr Bld: 6.5 % (ref 4.6–6.5)

## 2018-01-03 DIAGNOSIS — Z79811 Long term (current) use of aromatase inhibitors: Secondary | ICD-10-CM | POA: Insufficient documentation

## 2018-01-03 NOTE — Assessment & Plan Note (Signed)
.  LDL and triglycerides are at goal on current medications. SHE has no side effects and liver enzymes are normal. No changes today   Lab Results  Component Value Date   CHOL 160 09/14/2017   HDL 59.70 09/14/2017   LDLCALC 83 09/14/2017   LDLDIRECT 71.0 09/14/2017   TRIG 86.0 09/14/2017   CHOLHDL 3 09/14/2017   Lab Results  Component Value Date   ALT 8 01/01/2018   AST 13 01/01/2018   ALKPHOS 95 01/01/2018   BILITOT 0.3 01/01/2018

## 2018-01-03 NOTE — Assessment & Plan Note (Signed)
She is experiencing adverse effects of anastrozole include mood changes and hot flashes.  We discussed a trial of lexapro 5 mg daily

## 2018-01-03 NOTE — Assessment & Plan Note (Signed)
Historically well-controlled on current medications.  hemoglobin A1c has been consistently at or  less than 7.0 . Patient is up-to-date on eye exams and foot exam is normal today. Patient  Has NO  Urinary microalbuminuria   Patient is tolerating statin therapy for CAD risk reduction and on ACE Inhibitor  for reduction in proteinuria.   Lab Results  Component Value Date   HGBA1C 6.5 01/01/2018   Lab Results  Component Value Date   MICROALBUR 1.8 01/01/2018

## 2018-01-03 NOTE — Assessment & Plan Note (Signed)
Well controlled on current regimen. Renal function is stable, no changes today. Lab Results  Component Value Date   CREATININE 0.95 01/01/2018   Lab Results  Component Value Date   NA 139 01/01/2018   K 4.6 01/01/2018   CL 101 01/01/2018   CO2 31 01/01/2018

## 2018-01-04 ENCOUNTER — Telehealth: Payer: Self-pay | Admitting: Hematology and Oncology

## 2018-01-04 NOTE — Telephone Encounter (Signed)
Lvm advising appt chgd from 1/25 md pal to 1/31 @ 1.45pm.

## 2018-01-09 ENCOUNTER — Ambulatory Visit (INDEPENDENT_AMBULATORY_CARE_PROVIDER_SITE_OTHER): Payer: Medicare Other | Admitting: *Deleted

## 2018-01-09 DIAGNOSIS — E538 Deficiency of other specified B group vitamins: Secondary | ICD-10-CM | POA: Diagnosis not present

## 2018-01-09 DIAGNOSIS — H25812 Combined forms of age-related cataract, left eye: Secondary | ICD-10-CM | POA: Diagnosis not present

## 2018-01-09 MED ORDER — CYANOCOBALAMIN 1000 MCG/ML IJ SOLN
1000.0000 ug | Freq: Once | INTRAMUSCULAR | Status: AC
  Administered 2018-01-09: 1000 ug via INTRAMUSCULAR

## 2018-01-09 NOTE — Progress Notes (Addendum)
Patient presented for B 12 injection to left deltoid, patient voiced no concerns nor showed any signs of distress during injection  See lab note dated 01/01/18 X 3 weekly then monthly injections.   I have reviewed the above information and agree with above.   Deborra Medina, MD

## 2018-01-15 DIAGNOSIS — H2512 Age-related nuclear cataract, left eye: Secondary | ICD-10-CM | POA: Diagnosis not present

## 2018-01-15 DIAGNOSIS — H25812 Combined forms of age-related cataract, left eye: Secondary | ICD-10-CM | POA: Diagnosis not present

## 2018-01-16 ENCOUNTER — Ambulatory Visit (INDEPENDENT_AMBULATORY_CARE_PROVIDER_SITE_OTHER): Payer: Medicare Other | Admitting: *Deleted

## 2018-01-16 DIAGNOSIS — E538 Deficiency of other specified B group vitamins: Secondary | ICD-10-CM | POA: Diagnosis not present

## 2018-01-16 MED ORDER — CYANOCOBALAMIN 1000 MCG/ML IJ SOLN
1000.0000 ug | Freq: Once | INTRAMUSCULAR | Status: AC
  Administered 2018-01-16: 1000 ug via INTRAMUSCULAR

## 2018-01-16 NOTE — Progress Notes (Addendum)
Patient presented for B 12 injection to right deltoid, patient voiced no concerns nor showed any signs of distress during injection.  Reviewed.  Dr Scott 

## 2018-01-18 ENCOUNTER — Ambulatory Visit: Payer: Medicare Other | Admitting: Hematology and Oncology

## 2018-01-23 ENCOUNTER — Ambulatory Visit (INDEPENDENT_AMBULATORY_CARE_PROVIDER_SITE_OTHER): Payer: Medicare Other | Admitting: *Deleted

## 2018-01-23 DIAGNOSIS — E538 Deficiency of other specified B group vitamins: Secondary | ICD-10-CM

## 2018-01-23 MED ORDER — CYANOCOBALAMIN 1000 MCG/ML IJ SOLN
1000.0000 ug | Freq: Once | INTRAMUSCULAR | Status: AC
  Administered 2018-01-23: 1000 ug via INTRAMUSCULAR

## 2018-01-23 NOTE — Progress Notes (Signed)
Patient presented for B 12 injection to right deltoid, patient voiced no concerns nor showed any signs of distress during injection. 

## 2018-01-23 NOTE — Assessment & Plan Note (Signed)
Right lumpectomy: 11/23/2016: Intraductal papilloma with focal atypical ductal hyperplasia, surgical margins negative (Right breast biopsy 11/03/2016: Atypical papillary lesion cannot exclude low-grade DCIS)  Patient was offered tamoxifen therapy but she decided not to take it. --------------------------------------------------------------------------------------------------------------------------------------------------------------------- Malignant neoplasm of upper-outer quadrant of right breast in female, estrogen receptor positive ([place]) Right breast mass 9:00 position 1 cm from nipple: 4 mm; small asymmetry in the upper outer quadrant measuring 1 cm 04/19/2017: Right breast stereotactic biopsy: IDC with DCIS, biopsy 9:00 position: [facility], ER 100%, PR 100% HER-2 Neg 05/21/17: Right lumpectomy: IDC grade 1, microscopic focus, low-grade DCIS, 0/3 lymph nodes negative, Atypical lobular hyperplasia, ER 100%, PR 100%, HER-2 negative ratio 1.63, Ki-67 insufficient, T34mc, N0 stage IA  Adj XRT 06/18/17- 07/17/17 Current Treatment: Anastrozole 1 mg daily RTC in 1 year

## 2018-01-24 ENCOUNTER — Inpatient Hospital Stay: Payer: Medicare Other | Attending: Hematology and Oncology | Admitting: Hematology and Oncology

## 2018-01-24 ENCOUNTER — Telehealth: Payer: Self-pay | Admitting: Hematology and Oncology

## 2018-01-24 DIAGNOSIS — C50411 Malignant neoplasm of upper-outer quadrant of right female breast: Secondary | ICD-10-CM | POA: Diagnosis not present

## 2018-01-24 DIAGNOSIS — Z170 Estrogen receptor positive status [ER+]: Secondary | ICD-10-CM

## 2018-01-24 DIAGNOSIS — I890 Lymphedema, not elsewhere classified: Secondary | ICD-10-CM | POA: Diagnosis not present

## 2018-01-24 DIAGNOSIS — N951 Menopausal and female climacteric states: Secondary | ICD-10-CM

## 2018-01-24 DIAGNOSIS — M791 Myalgia, unspecified site: Secondary | ICD-10-CM | POA: Diagnosis not present

## 2018-01-24 MED ORDER — ANASTROZOLE 1 MG PO TABS: 1.0000 mg | ORAL_TABLET | Freq: Every day | ORAL | 3 refills | Status: DC

## 2018-01-24 NOTE — Progress Notes (Signed)
Patient Care Team: [name], MD as PCP - General (Internal Medicine) [name], MD (General Surgery) [name], MD as Consulting Physician (Hematology and Oncology) Delice Bison, Charlestine Massed, NP as Nurse Practitioner (Hematology and Oncology) Kyung Rudd, MD as Consulting Physician (Radiation Oncology) [name], MD as Consulting Physician (General Surgery)  DIAGNOSIS:  Encounter Diagnosis  Name Primary?  . Malignant neoplasm of upper-outer quadrant of right breast in female, estrogen receptor positive ([place])     SUMMARY OF ONCOLOGIC HISTORY:   Malignant neoplasm of upper-outer quadrant of right breast in female, estrogen receptor positive ([place])   04/09/2017 Mammogram    Right breast mass 9:00 position 1 cm from nipple: 4 mm; small asymmetry in the upper outer quadrant measuring 1 cm      04/20/2017 Initial Diagnosis    Right breast stereotactic biopsy: IDC with DCIS, biopsy 9:00 position: [facility], ER 100%, PR 100% HER-2 pending      05/16/2017 Surgery    Right lumpectomy: IDC grade 1, microscopic focus, low-grade DCIS, 0/3 lymph nodes negative, Atypical lobular hyperplasia, ER 100%, PR 100%, HER-2 negative ratio 1.63, Ki-67 insufficient, T44mc, N0 stage IA      06/18/2017 - 07/17/2017 Radiation Therapy    Adjuvant radiation therapy      09/2017 -  Anti-estrogen oral therapy    Anastrozole daily       CHIEF COMPLIANT: Follow-up on anastrozole therapy  INTERVAL HISTORY: Martha WIRKKALAis a 67year old with above-mentioned history of right breast cancer treated with lumpectomy adjuvant radiation is currently on anastrozole therapy.  She is tolerating anastrozole extremely well.  She does not have any lumps or nodules in the breast.  She does have hot flashes some joint and muscle stiffness and achiness.  She also feels anxious periodically.  REVIEW OF SYSTEMS:   Constitutional: Denies fevers, chills or abnormal weight loss Eyes: Denies blurriness  of vision Ears, nose, mouth, throat, and face: Denies mucositis or sore throat Respiratory: Denies cough, dyspnea or wheezes Cardiovascular: Denies palpitation, chest discomfort Gastrointestinal:  Denies nausea, heartburn or change in bowel habits Skin: Denies abnormal skin rashes Lymphatics: Denies new lymphadenopathy or easy bruising Neurological:Denies numbness, tingling or new weaknesses Behavioral/Psych: Mood is stable, no new changes  Extremities: No lower extremity edema Breast: Right breast lymphedema All other systems were reviewed with the patient and are negative.  I have reviewed the past medical history, past surgical history, social history and family history with the patient and they are unchanged from previous note.  ALLERGIES:  has No Known Allergies.  MEDICATIONS:  Current Outpatient Medications  Medication Sig Dispense Refill  . anastrozole (ARIMIDEX) 1 MG tablet Take 1 tablet (1 mg total) by mouth daily. 90 tablet 3  . aspirin 81 MG tablet Take 81 mg by mouth daily.    .Marland Kitchenescitalopram (LEXAPRO) 5 MG tablet Take 1 tablet (5 mg total) by mouth daily. 90 tablet 0  . fluticasone (FLONASE) 50 MCG/ACT nasal spray Place 2 sprays into both nostrils daily. 16 g 2  . lisinopril-hydrochlorothiazide (PRINZIDE,ZESTORETIC) 20-12.5 MG tablet Take 1 tablet by mouth daily. 90 tablet 1  . metFORMIN (GLUCOPHAGE) 500 MG tablet TAKE 1 TABLET BY MOUTH TWO  TIMES DAILY WITH MEALS 180 tablet 3  . non-metallic deodorant (ALRA) MISC Apply 1 application topically.    . simvastatin (ZOCOR) 40 MG tablet TAKE 1 TABLET BY MOUTH  DAILY 90 tablet 3  . triamcinolone cream (KENALOG) 0.1 % Apply topically 2 (two) times daily. For itchy  rash 30 g 1  . Vitamin D, Ergocalciferol, (DRISDOL) 50000 units CAPS capsule TAKE ONE CAPSULE BY MOUTH ONE TIME PER WEEK 4 capsule 2   No current facility-administered medications for this visit.     PHYSICAL EXAMINATION: ECOG PERFORMANCE STATUS: 1 - Symptomatic but  completely ambulatory  Vitals:   01/24/18 1421  BP: (!) 111/49  Pulse: 84  Resp: 20  Temp: 97.8 F (36.6 C)  SpO2: 100%   Filed Weights   01/24/18 1421  Weight: 202 lb 3.2 oz (91.7 kg)    GENERAL:alert, no distress and comfortable SKIN: skin color, texture, turgor are normal, no rashes or significant lesions EYES: normal, Conjunctiva are pink and non-injected, sclera clear OROPHARYNX:no exudate, no erythema and lips, buccal mucosa, and tongue normal  NECK: supple, thyroid normal size, non-tender, without nodularity LYMPH:  no palpable lymphadenopathy in the cervical, axillary or inguinal LUNGS: clear to auscultation and percussion with normal breathing effort HEART: regular rate & rhythm and no murmurs and no lower extremity edema ABDOMEN:abdomen soft, non-tender and normal bowel sounds MUSCULOSKELETAL:no cyanosis of digits and no clubbing  NEURO: alert & oriented x 3 with fluent speech, no focal motor/sensory deficits EXTREMITIES: No lower extremity edema BREAST: Lymphedema involving the right breast and tenderness.  No palpable masses or nodules in either right or left breasts. No palpable axillary supraclavicular or infraclavicular adenopathy no breast tenderness or nipple discharge. (exam performed in the presence of a chaperone)  LABORATORY DATA:  I have reviewed the data as listed CMP Latest Ref Rng & Units 01/01/2018 09/14/2017 06/07/2017  Glucose 70 - 99 mg/dL 96 115(H) 114(H)  BUN 6 - 23 mg/dL '18 19 18  ' Creatinine 0.40 - 1.20 mg/dL 0.95 1.05 1.03  Sodium 135 - 145 mEq/L 139 140 140  Potassium 3.5 - 5.1 mEq/L 4.6 4.6 4.4  Chloride 96 - 112 mEq/L 101 104 104  CO2 19 - 32 mEq/L '31 30 30  ' Calcium 8.4 - 10.5 mg/dL 9.8 9.7 9.7  Total Protein 6.0 - 8.3 g/dL 7.2 6.6 6.7  Total Bilirubin 0.2 - 1.2 mg/dL 0.3 0.4 0.3  Alkaline Phos 39 - 117 U/L 95 91 107  AST 0 - 37 U/L '13 12 11  ' ALT 0 - 35 U/L '8 8 6    ' Lab Results  Component Value Date   WBC 8.8 07/20/2016   HGB 12.1  07/20/2016   HCT 36.9 07/20/2016   MCV 91.7 07/20/2016   PLT 323.0 07/20/2016   NEUTROABS 6.0 07/20/2016    ASSESSMENT & PLAN:  Malignant neoplasm of upper-outer quadrant of right breast in female, estrogen receptor positive ([place]) Right lumpectomy: 11/23/2016: Intraductal papilloma with focal atypical ductal hyperplasia, surgical margins negative (Right breast biopsy 11/03/2016: Atypical papillary lesion cannot exclude low-grade DCIS)  Patient was offered tamoxifen therapy but she decided not to take it. --------------------------------------------------------------------------------------------------------------------------------------------------------------------- Malignant neoplasm of upper-outer quadrant of right breast in female, estrogen receptor positive ([place]) Right breast mass 9:00 position 1 cm from nipple: 4 mm; small asymmetry in the upper outer quadrant measuring 1 cm 04/19/2017: Right breast stereotactic biopsy: IDC with DCIS, biopsy 9:00 position: [facility], ER 100%, PR 100% HER-2 Neg 05/21/17: Right lumpectomy: IDC grade 1, microscopic focus, low-grade DCIS, 0/3 lymph nodes negative, Atypical lobular hyperplasia, ER 100%, PR 100%, HER-2 negative ratio 1.63, Ki-67 insufficient, T47mc, N0 stage IA  Adj XRT 06/18/17- 07/17/17 Current Treatment: Anastrozole 1 mg daily Anastrozole toxicities: 1.  Hot flashes 2. Myalgias  Breast lymphedema: We will consult physical therapy. RTC  in 1 year    I spent 25 minutes talking to the patient of which more than half was spent in counseling and coordination of care.  No orders of the defined types were placed in this encounter.  The patient has a good understanding of the overall plan. she agrees with it. she will call with any problems that may develop before the next visit here.   Harriette Ohara, MD 01/24/18

## 2018-01-24 NOTE — Addendum Note (Signed)
Addended by: Cyndia Bent on: 01/24/2018 02:48 PM   Modules accepted: Orders

## 2018-01-24 NOTE — Telephone Encounter (Signed)
Scheduled appts per 1/31 los - patient did not want avs or calendar.

## 2018-01-27 NOTE — Progress Notes (Signed)
  I have reviewed the above information and agree with above.   Loi Rennaker, MD 

## 2018-01-30 ENCOUNTER — Ambulatory Visit: Payer: Medicare Other | Admitting: Physical Therapy

## 2018-02-06 ENCOUNTER — Encounter: Payer: Self-pay | Admitting: Physical Therapy

## 2018-02-06 ENCOUNTER — Ambulatory Visit: Payer: Medicare Other | Attending: Hematology and Oncology | Admitting: Physical Therapy

## 2018-02-06 DIAGNOSIS — M6281 Muscle weakness (generalized): Secondary | ICD-10-CM | POA: Diagnosis not present

## 2018-02-06 DIAGNOSIS — M25611 Stiffness of right shoulder, not elsewhere classified: Secondary | ICD-10-CM | POA: Diagnosis not present

## 2018-02-06 DIAGNOSIS — I890 Lymphedema, not elsewhere classified: Secondary | ICD-10-CM

## 2018-02-06 NOTE — Therapy (Signed)
[place], [place], [address] Phone: [phone]   Fax:  [phone]  Physical Therapy Evaluation  Patient Details  Name: Martha [name] MRN: [id number] Date of Birth: [birth year] Referring Provider: Dr. Lindi Adie   Encounter Date: 02/06/2018  PT End of Session - 02/06/18 1656    Visit Number  1    Number of Visits  11    Date for PT Re-Evaluation  03/13/18    PT Start Time  5329    PT Stop Time  1645    PT Time Calculation (min)  36 min    Activity Tolerance  Patient tolerated treatment well    Behavior During Therapy  [facility] for tasks assessed/performed       Past Medical History:  Diagnosis Date  . Arthritis   . 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 Cancer ([place]) 04/2017   right breast cancer  . Colon polyp 2008  . Diabetes mellitus without complication ([place])   . Endometrial hyperplasia 2013  . GERD (gastroesophageal reflux disease)    OCC-NO MEDS  . Headache    H/O MIGRAINES  . Hypertension   . Papilloma 11/20/2016   right breast  . Status post dilation of esophageal narrowing 2008    Past Surgical History:  Procedure Laterality Date  . BREAST BIOPSY Right 11/02/2016   ATYPICAL PAPILLARY LESION,  . BREAST EXCISIONAL BIOPSY Right 11/20/2016   AHD  . BREAST LUMPECTOMY Right 11/20/2016   Procedure: BREAST LUMPECTOMY;  Surgeon: [name], MD;  Location: [facility] ORS;  Service: General;  Laterality: Right;  . COLONOSCOPY  2008   in [place]  . DILATION AND CURETTAGE OF UTERUS    . RADIOACTIVE SEED GUIDED PARTIAL MASTECTOMY WITH AXILLARY SENTINEL LYMPH NODE BIOPSY Right 05/16/2017   Procedure: RIGHT RADIOACTIVE SEED GUIDED LUMPECTOMY  WITH  RIGHT BREAST SEED GUIDED EXCISIONAL BIOPSY AND RIGHT AXILLARY SENTINEL  NODE BIOPSY;  Surgeon: [name], MD;  Location: [place];  Service: General;  Laterality: Right;  2 SEEDS RIGHT BREAST  GENERAL  AND PEC BLOCK ANESHTESIA    There were no vitals filed for this visit.   Subjective Assessment - 02/06/18 1613    Subjective  I have noticed for a while that my right breast felt heavier than the left breast. Dr. Lindi Adie said I had some swelling and he gave me a prescription to go to the bra place.     Pertinent History  prior history of atypical ductal hyperplasia of the right breast who presented with a mammographic abnormality that led to ultrasound and a stereotactic biopsy on 04/20/2017. 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 HER-2 is pending, lumpectomy 05/16/17 with SLNB, pt completed radiation    Patient Stated Goals  to get my right breast swelling down and get back to exercising    Currently in Pain?  No/denies    Pain Score  0-No pain         [facility] PT Assessment - 02/06/18 0001      Assessment   Medical Diagnosis  right breast cancer    Referring Provider  Dr. Lindi Adie    Onset Date/Surgical Date  05/16/17    Hand Dominance  Right    Prior Therapy  received therapy for R shoulder ROM at this clinic last summer      Precautions   Precautions  Other (comment) at risk for lymphedema  Restrictions   Weight Bearing Restrictions  No      Balance Screen   Has the patient fallen in the past 6 months  Yes    How many times?  1 pushing solar light into ground and it broke and pt fell     Has the patient had a decrease in activity level because of a fear of falling?   No    Is the patient reluctant to leave their home because of a fear of falling?   No      Home Environment   Living Environment  Private residence    Living Arrangements  Spouse/significant other    Available Help at Discharge  Family    Type of [place] Access  Level entry    [place]  Two level    Alternate Level Stairs-Number of Steps  13    Alternate Level Stairs-Rails  Can reach both      Prior Function   Level of Independence  Independent     Vocation  Retired works in Pharmacist, community 1 day a week    [employer]  standing, washing hair, cutting hair    Leisure  has not been working out regularly      Cognition   Overall Cognitive Status  Within Functional Limits for tasks assessed      Observation/Other Assessments   Observations  pt's right breast is 30% larger than her left    Skin Integrity  --      AROM   Right Shoulder Flexion  146 Degrees    Right Shoulder ABduction  125 Degrees    Right Shoulder Internal Rotation  66 Degrees    Right Shoulder External Rotation  61 Degrees    Left Shoulder Flexion  161 Degrees    Left Shoulder ABduction  165 Degrees    Left Shoulder Internal Rotation  69 Degrees    Left Shoulder External Rotation  90 Degrees        LYMPHEDEMA/ONCOLOGY QUESTIONNAIRE - 02/06/18 1625      Type   Cancer Type  right breast cancer      Surgeries   Lumpectomy Date  05/16/17    Sentinel Lymph Node Biopsy Date  05/16/17    Number Lymph Nodes Removed  2      Date Lymphedema/Swelling Started   Date  10/25/17      Treatment   Active Chemotherapy Treatment  No    Past Chemotherapy Treatment  No    Active Radiation Treatment  No    Past Radiation Treatment  Yes    Current Hormone Treatment  Yes    Drug Name  Anastrozole    Past Hormone Therapy  No      What other symptoms do you have   Are you Having Heaviness or Tightness  Yes    Are you having Pain  Yes in the breast    Are you having pitting edema  No    Is it Hard or Difficult finding clothes that fit  --    Do you have infections  No    Is there Decreased scar mobility  No      Lymphedema Assessments   Lymphedema Assessments  Upper extremities      Right Upper Extremity Lymphedema   15 cm Proximal to Olecranon Process  36 cm    Olecranon Process  27.2 cm    15 cm Proximal to Ulnar Styloid Process  25.7  cm    Just Proximal to Ulnar Styloid Process  17 cm    Across Hand at [employer]  20 cm    At [place] of 2nd Digit  7 cm       Left Upper Extremity Lymphedema   15 cm Proximal to Olecranon Process  34.5 cm    Olecranon Process  26.6 cm    15 cm Proximal to Ulnar Styloid Process  25 cm    Just Proximal to Ulnar Styloid Process  16.7 cm    Across Hand at [employer]  19.8 cm    At [place] of 2nd Digit  7 cm        Quick Dash - 02/06/18 0001    Open a tight or new jar  Moderate difficulty    Do heavy household chores (wash walls, wash floors)  Moderate difficulty    Carry a shopping bag or briefcase  Mild difficulty    Wash your back  No difficulty    Use a knife to cut food  No difficulty    Recreational activities in which you take some force or impact through your arm, shoulder, or hand (golf, hammering, tennis)  Severe difficulty    During the past week, to what extent has your arm, shoulder or hand problem interfered with your normal social activities with family, friends, neighbors, or groups?  Not at all    During the past week, to what extent has your arm, shoulder or hand problem limited your work or other regular daily activities  Slightly    Arm, shoulder, or hand pain.  Moderate    Tingling (pins and needles) in your arm, shoulder, or hand  Moderate    Difficulty Sleeping  Moderate difficulty    DASH Score  34.09 %       Objective measurements completed on examination: See above findings.      [place] Adult PT Treatment/Exercise - 02/06/18 0001      Manual Therapy   Manual Therapy  Edema management    Edema Management  created foam chip pack for pt to wear in her bra             PT Education - 02/06/18 1706    Education provided  Yes    Education Details  anatomy and physiology of lymphatic system, importance of compression bra    Person(s) Educated  Patient    Methods  Explanation    Comprehension  Verbalized understanding          PT Long Term Goals - 02/06/18 1702      PT LONG TERM GOAL #1   Title  Pt will report a 50% decrease in right breast swelling to decrease  risk of infection    Time  5    Period  Weeks    Status  New    Target Date  03/13/18      PT LONG TERM GOAL #2   Title  Pt will demonstrate 160 degrees of right shoulder flexion to allow her to reach overhead    Baseline  146    Time  5    Period  Weeks    Status  New    Target Date  03/13/18      PT LONG TERM GOAL #3   Title  Pt will demonstrate 160 degrees of right shoulder abduction to allow her to reach out to sides    Baseline  125    Time  5    Period  Weeks    Status  New    Target Date  03/13/18      PT LONG TERM GOAL #4   Title  Pt will be independent in a home exercise program for continued strengthening and stretching    Time  5    Period  Weeks    Status  New    Target Date  03/13/18      PT LONG TERM GOAL #5   Title  Pt will obtain appropriate compression garments for long term management of lymphedema.     Time  5    Period  Weeks    Status  New    Target Date  03/13/18          Plan - 02/06/18 1657    Clinical Impression Statement  Pt presents to PT with R breast swelling following radiation to right breast, lumpectomy and SLNB for treatment of R breast cancer last year. Pt was seen by this clinic last year for decreased right shouder ROM but reports her right breast began swelling in November of 2018. Her right breast is about 30% larger than the left with increased fibrosis and enlarged pores. Pt also has decreased R shoulder ROM. She would benefit from skilled PT services to decrease right breast swelling, increase right shoulder ROM and assist pt with obtaining an appropriate compression garment.     History and Personal Factors relevant to plan of care:  pt is right handed, diabetes    Clinical Presentation  Stable    Clinical Decision Making  Low    Rehab Potential  Good    Clinical Impairments Affecting Rehab Potential  hx of radiation    PT Frequency  2x / week    PT Duration  -- 5 weeks    PT Treatment/Interventions  ADLs/Self Care Home  Management;Therapeutic exercise;Patient/family education;Orthotic Fit/Training;Compression bandaging;Manual lymph drainage;Manual techniques;Passive range of motion;Taping;Scar mobilization;Vasopneumatic Device    PT Next Visit Plan  begin MLD to R breast, give supine dowel exercises, see if pt obtained a compression bra    Consulted and Agree with Plan of Care  Patient       Patient will benefit from skilled therapeutic intervention in order to improve the following deficits and impairments:  Increased fascial restricitons, Pain, Decreased scar mobility, Impaired UE functional use, Decreased strength, Decreased range of motion, Increased edema  Visit Diagnosis: Lymphedema, not elsewhere classified  Stiffness of right shoulder, not elsewhere classified  Muscle weakness (generalized)     Problem List Patient Active Problem List   Diagnosis Date Noted  . Use of anastrozole (Arimidex) 01/03/2018  . Localized maculopapular rash 06/16/2017  . Malignant neoplasm of upper-outer quadrant of right breast in female, estrogen receptor positive ([place]) 04/26/2017  . Low back pain 01/29/2017  . Atypical ductal hyperplasia of right breast 01/15/2017  . Allergic rhinitis 04/12/2015  . Diabetes mellitus type 2, controlled ([place]) 07/24/2013  . Hyperlipidemia 07/24/2013  . Essential hypertension, benign 07/24/2013  . Status post dilation of esophageal narrowing 07/24/2013  . Obesity, unspecified 07/24/2013    Allyson Sabal [facility] 02/06/2018, 5:07 PM  [place], [place], [address] Phone: [phone]   Fax:  [phone]  Name: LANNIE HEAPS MRN: [id number] Date of Birth: 1951-02-19  Manus Gunning, PT 02/06/18 5:07 PM

## 2018-02-11 DIAGNOSIS — C50911 Malignant neoplasm of unspecified site of right female breast: Secondary | ICD-10-CM | POA: Diagnosis not present

## 2018-02-13 ENCOUNTER — Ambulatory Visit: Payer: Medicare Other | Admitting: Physical Therapy

## 2018-02-13 ENCOUNTER — Other Ambulatory Visit: Payer: Self-pay | Admitting: Internal Medicine

## 2018-02-14 ENCOUNTER — Ambulatory Visit: Payer: Medicare Other

## 2018-02-14 DIAGNOSIS — M6281 Muscle weakness (generalized): Secondary | ICD-10-CM | POA: Diagnosis not present

## 2018-02-14 DIAGNOSIS — I890 Lymphedema, not elsewhere classified: Secondary | ICD-10-CM | POA: Diagnosis not present

## 2018-02-14 DIAGNOSIS — M25611 Stiffness of right shoulder, not elsewhere classified: Secondary | ICD-10-CM

## 2018-02-14 NOTE — Therapy (Signed)
[place], [place], [address] Phone: [phone]   Fax:  [phone]  Physical Therapy Treatment  Patient Details  Name: Martha [name] MRN: [id number] Date of Birth: [birth year] Referring Provider: Dr. Lindi Adie   Encounter Date: 02/14/2018  PT End of Session - 02/14/18 0937    Visit Number  2    Number of Visits  11    Date for PT Re-Evaluation  03/13/18    PT Start Time  0852    PT Stop Time  0937    PT Time Calculation (min)  45 min    Activity Tolerance  Patient tolerated treatment well    Behavior During Therapy  [facility] for tasks assessed/performed       Past Medical History:  Diagnosis Date  . Arthritis   . 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 Cancer ([place]) 04/2017   right breast cancer  . Colon polyp 2008  . Diabetes mellitus without complication ([place])   . Endometrial hyperplasia 2013  . GERD (gastroesophageal reflux disease)    OCC-NO MEDS  . Headache    H/O MIGRAINES  . Hypertension   . Papilloma 11/20/2016   right breast  . Status post dilation of esophageal narrowing 2008    Past Surgical History:  Procedure Laterality Date  . BREAST BIOPSY Right 11/02/2016   ATYPICAL PAPILLARY LESION,  . BREAST EXCISIONAL BIOPSY Right 11/20/2016   AHD  . BREAST LUMPECTOMY Right 11/20/2016   Procedure: BREAST LUMPECTOMY;  Surgeon: [name], MD;  Location: [facility] ORS;  Service: General;  Laterality: Right;  . COLONOSCOPY  2008   in [place]  . DILATION AND CURETTAGE OF UTERUS    . RADIOACTIVE SEED GUIDED PARTIAL MASTECTOMY WITH AXILLARY SENTINEL LYMPH NODE BIOPSY Right 05/16/2017   Procedure: RIGHT RADIOACTIVE SEED GUIDED LUMPECTOMY  WITH  RIGHT BREAST SEED GUIDED EXCISIONAL BIOPSY AND RIGHT AXILLARY SENTINEL  NODE BIOPSY;  Surgeon: [name], MD;  Location: [facility];  Service: General;  Laterality: Right;  2 SEEDS RIGHT BREAST  GENERAL AND  PEC BLOCK ANESHTESIA    There were no vitals filed for this visit.  Subjective Assessment - 02/14/18 0856    Subjective  I've been wearing the foam she gave me and I got my compression bras on Monday and I can already tell an improvement with the swelling.     Pertinent History  prior history of atypical ductal hyperplasia of the right breast who presented with a mammographic abnormality that led to ultrasound and a stereotactic biopsy on 04/20/2017. 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 HER-2 is pending, lumpectomy 05/16/17 with SLNB, pt completed radiation    Patient Stated Goals  to get my right breast swelling down and get back to exercising    Currently in Pain?  No/denies                      [facility] Adult PT Treatment/Exercise - 02/14/18 0001      Shoulder Exercises: Supine   Horizontal ABduction  AAROM;Right;5 reps into abduction with dowel    External Rotation  AAROM;Right;5 reps with dowel and then with fingers clasped behind head    Flexion  AAROM;Both;5 reps with dowel      Manual Therapy   Manual Therapy  Manual Lymphatic Drainage (MLD)    Manual Lymphatic Drainage (MLD)  In Supine: Short neck, superficial and deep abdominals, Rt  inguinal and Lt axillary nodes, Rt axillo-inguinal and Lt axillary anastomosis then focused on Rt breast and areas of fibrosis redirecting towards pathways beginning to instruct pt in sequencing throughout.             PT Education - 02/14/18 0900    Education provided  Yes    Education Details  Supine dowel exercises    Person(s) Educated  Patient    Methods  Explanation;Demonstration;Handout    Comprehension  Verbalized understanding;Returned demonstration;Need further instruction          PT Long Term Goals - 02/06/18 1702      PT LONG TERM GOAL #1   Title  Pt will report a 50% decrease in right breast swelling to decrease risk of infection    Time  5    Period  Weeks     Status  New    Target Date  03/13/18      PT LONG TERM GOAL #2   Title  Pt will demonstrate 160 degrees of right shoulder flexion to allow her to reach overhead    Baseline  146    Time  5    Period  Weeks    Status  New    Target Date  03/13/18      PT LONG TERM GOAL #3   Title  Pt will demonstrate 160 degrees of right shoulder abduction to allow her to reach out to sides    Baseline  125    Time  5    Period  Weeks    Status  New    Target Date  03/13/18      PT LONG TERM GOAL #4   Title  Pt will be independent in a home exercise program for continued strengthening and stretching    Time  5    Period  Weeks    Status  New    Target Date  03/13/18      PT LONG TERM GOAL #5   Title  Pt will obtain appropriate compression garments for long term management of lymphedema.     Time  5    Period  Weeks    Status  New    Target Date  03/13/18            Plan - 02/14/18 7253    Clinical Impression Statement  Pt tolerated first session of manual lymph drainage very well asking appropriate questions throughout initial education of sequence and basics of MLD. Pt reported noting fibrosis sosfter after session and therapaist noted breast overall palpably softer as well.     Rehab Potential  Good    Clinical Impairments Affecting Rehab Potential  hx of radiation    PT Frequency  2x / week    PT Duration  -- 5 weeks    PT Treatment/Interventions  ADLs/Self Care Home Management;Therapeutic exercise;Patient/family education;Orthotic Fit/Training;Compression bandaging;Manual lymph drainage;Manual techniques;Passive range of motion;Taping;Scar mobilization;Vasopneumatic Device    PT Next Visit Plan  Cont MLD to R breast having pt perform and issue handout, also instruct husband if he comes, review supine dowel exercises.    Consulted and Agree with Plan of Care  Patient       Patient will benefit from skilled therapeutic intervention in order to improve the following deficits and  impairments:  Increased fascial restricitons, Pain, Decreased scar mobility, Impaired UE functional use, Decreased strength, Decreased range of motion, Increased edema  Visit Diagnosis: Lymphedema, not elsewhere classified  Stiffness of right shoulder,  not elsewhere classified     Problem List Patient Active Problem List   Diagnosis Date Noted  . Use of anastrozole (Arimidex) 01/03/2018  . Localized maculopapular rash 06/16/2017  . Malignant neoplasm of upper-outer quadrant of right breast in female, estrogen receptor positive ([place]) 04/26/2017  . Low back pain 01/29/2017  . Atypical ductal hyperplasia of right breast 01/15/2017  . Allergic rhinitis 04/12/2015  . Diabetes mellitus type 2, controlled ([place]) 07/24/2013  . Hyperlipidemia 07/24/2013  . Essential hypertension, benign 07/24/2013  . Status post dilation of esophageal narrowing 07/24/2013  . Obesity, unspecified 07/24/2013    Otelia Limes, PTA 02/14/2018, 9:40 AM  [place], [place], [address] Phone: [phone]   Fax:  [phone]  Name: Martha [name] MRN: [id number] Date of Birth: 1951-07-13

## 2018-02-14 NOTE — Patient Instructions (Signed)
SHOULDER: Flexion - Supine (Cane)        Cancer Rehab [phone]    Hold cane in both hands. Raise arms up overhead. Do not allow back to arch. Hold _5__ seconds. Do __5-10__ times; __1-2__ times a day.   SELF ASSISTED WITH OBJECT: Shoulder Abduction / Adduction - Supine    Hold cane with both hands. Move both arms from side to side, keep elbows straight.  Hold when stretch felt for __5__ seconds. Repeat __5-10__ times; __1-2__ times a day. Once this becomes easier progress to third picture bringing affected arm towards ear by staying out to side. Same hold for _5_seconds. Repeat  _5-10_ times, _1-2_ times/day.  Shoulder Blade Stretch    Clasp fingers behind head with elbows touching in front of face. Pull elbows back while pressing shoulder blades together. Relax and hold as tolerated, can place pillow under elbow here for comfort as needed and to allow for prolonged stretch.  Repeat __5__ times. Do __1-2__ sessions per day.     SHOULDER: External Rotation - Supine (Cane)    Hold cane with both hands. Rotate arm away from body. Keep elbow on floor and next to body. _5-10__ reps per set, hold 5 seconds, _1-2__ sets per day. Add towel to keep elbow at side. 

## 2018-02-15 DIAGNOSIS — H02413 Mechanical ptosis of bilateral eyelids: Secondary | ICD-10-CM | POA: Diagnosis not present

## 2018-02-18 ENCOUNTER — Ambulatory Visit: Payer: Medicare Other | Admitting: Physical Therapy

## 2018-02-18 ENCOUNTER — Encounter: Payer: Self-pay | Admitting: Physical Therapy

## 2018-02-18 DIAGNOSIS — I890 Lymphedema, not elsewhere classified: Secondary | ICD-10-CM

## 2018-02-18 DIAGNOSIS — M6281 Muscle weakness (generalized): Secondary | ICD-10-CM | POA: Diagnosis not present

## 2018-02-18 DIAGNOSIS — M25611 Stiffness of right shoulder, not elsewhere classified: Secondary | ICD-10-CM | POA: Diagnosis not present

## 2018-02-18 NOTE — Therapy (Signed)
[place], [place], [address] Phone: [phone]   Fax:  [phone]  Physical Therapy Treatment  Patient Details  Name: Martha [name] MRN: [id number] Date of Birth: Jul 18, 1951 Referring Provider: Dr. Lindi Adie   Encounter Date: 02/18/2018  PT End of Session - 02/18/18 1700    Visit Number  3    Number of Visits  11    Date for PT Re-Evaluation  03/13/18    PT Start Time  1520    PT Stop Time  1604    PT Time Calculation (min)  44 min    Activity Tolerance  Patient tolerated treatment well    Behavior During Therapy  [facility] for tasks assessed/performed       Past Medical History:  Diagnosis Date  . Arthritis   . 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 Cancer ([place]) 04/2017   right breast cancer  . Colon polyp 2008  . Diabetes mellitus without complication ([place])   . Endometrial hyperplasia 2013  . GERD (gastroesophageal reflux disease)    OCC-NO MEDS  . Headache    H/O MIGRAINES  . Hypertension   . Papilloma 11/20/2016   right breast  . Status post dilation of esophageal narrowing 2008    Past Surgical History:  Procedure Laterality Date  . BREAST BIOPSY Right 11/02/2016   ATYPICAL PAPILLARY LESION,  . BREAST EXCISIONAL BIOPSY Right 11/20/2016   AHD  . BREAST LUMPECTOMY Right 11/20/2016   Procedure: BREAST LUMPECTOMY;  Surgeon: [name], MD;  Location: [facility] ORS;  Service: General;  Laterality: Right;  . COLONOSCOPY  2008   in [place]  . DILATION AND CURETTAGE OF UTERUS    . RADIOACTIVE SEED GUIDED PARTIAL MASTECTOMY WITH AXILLARY SENTINEL LYMPH NODE BIOPSY Right 05/16/2017   Procedure: RIGHT RADIOACTIVE SEED GUIDED LUMPECTOMY  WITH  RIGHT BREAST SEED GUIDED EXCISIONAL BIOPSY AND RIGHT AXILLARY SENTINEL  NODE BIOPSY;  Surgeon: [name], MD;  Location: [facility];  Service: General;  Laterality: Right;  2 SEEDS RIGHT BREAST  GENERAL AND  PEC BLOCK ANESHTESIA    There were no vitals filed for this visit.  Subjective Assessment - 02/18/18 1523    Subjective  I got my compression bras last week.     Pertinent History  prior history of atypical ductal hyperplasia of the right breast who presented with a mammographic abnormality that led to ultrasound and a stereotactic biopsy on 04/20/2017. 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 HER-2 is pending, lumpectomy 05/16/17 with SLNB, pt completed radiation    Patient Stated Goals  to get my right breast swelling down and get back to exercising    Currently in Pain?  No/denies    Pain Score  0-No pain                      [facility] Adult PT Treatment/Exercise - 02/18/18 0001      Manual Therapy   Manual Therapy  Manual Lymphatic Drainage (MLD)    Manual Lymphatic Drainage (MLD)  Issued handout and had pt follow along occasionally demonstrating correct technique: In Supine: Short neck, 5 diaphragmatic breaths, Rt inguinal and Lt axillary nodes, Rt axillo-inguinal and Lt axillary anastomosis then focused on Rt breast and areas of fibrosis redirecting towards pathways beginning to instruct pt in sequencing throughout.  PT Long Term Goals - 02/06/18 1702      PT LONG TERM GOAL #1   Title  Pt will report a 50% decrease in right breast swelling to decrease risk of infection    Time  5    Period  Weeks    Status  New    Target Date  03/13/18      PT LONG TERM GOAL #2   Title  Pt will demonstrate 160 degrees of right shoulder flexion to allow her to reach overhead    Baseline  146    Time  5    Period  Weeks    Status  New    Target Date  03/13/18      PT LONG TERM GOAL #3   Title  Pt will demonstrate 160 degrees of right shoulder abduction to allow her to reach out to sides    Baseline  125    Time  5    Period  Weeks    Status  New    Target Date  03/13/18      PT LONG TERM GOAL #4   Title  Pt  will be independent in a home exercise program for continued strengthening and stretching    Time  5    Period  Weeks    Status  New    Target Date  03/13/18      PT LONG TERM GOAL #5   Title  Pt will obtain appropriate compression garments for long term management of lymphedema.     Time  5    Period  Weeks    Status  New    Target Date  03/13/18            Plan - 02/18/18 1700    Clinical Impression Statement  Instructed pt in self MLD today and issued handout. Had pt demonstrate basic hand technique today and answered pt's questions. Educated pt to perform self MLD to right breast at least 1x/day for around 20-30 minutes. Decrease of edema visible today after session as evidenced by increased wrinkles in skin.     Rehab Potential  Good    Clinical Impairments Affecting Rehab Potential  hx of radiation    PT Frequency  2x / week    PT Duration  -- 5 weeks    PT Treatment/Interventions  ADLs/Self Care Home Management;Therapeutic exercise;Patient/family education;Orthotic Fit/Training;Compression bandaging;Manual lymph drainage;Manual techniques;Passive range of motion;Taping;Scar mobilization;Vasopneumatic Device    PT Next Visit Plan  Cont MLD to R breast having pt perform and review handout, also instruct husband if he comes, review supine dowel exercises.    Consulted and Agree with Plan of Care  Patient       Patient will benefit from skilled therapeutic intervention in order to improve the following deficits and impairments:  Increased fascial restricitons, Pain, Decreased scar mobility, Impaired UE functional use, Decreased strength, Decreased range of motion, Increased edema  Visit Diagnosis: Lymphedema, not elsewhere classified     Problem List Patient Active Problem List   Diagnosis Date Noted  . Use of anastrozole (Arimidex) 01/03/2018  . Localized maculopapular rash 06/16/2017  . Malignant neoplasm of upper-outer quadrant of right breast in female, estrogen  receptor positive ([place]) 04/26/2017  . Low back pain 01/29/2017  . Atypical ductal hyperplasia of right breast 01/15/2017  . Allergic rhinitis 04/12/2015  . Diabetes mellitus type 2, controlled ([place]) 07/24/2013  . Hyperlipidemia 07/24/2013  . Essential hypertension, benign 07/24/2013  . Status post  dilation of esophageal narrowing 07/24/2013  . Obesity, unspecified 07/24/2013    Allyson Sabal [facility] 02/18/2018, [place], [place], [address] Phone: [phone]   Fax:  [phone]  Name: Martha [name] MRN: [id number] Date of Birth: 08-29-1951  Manus Gunning, PT 02/18/18 5:02 PM

## 2018-02-18 NOTE — Patient Instructions (Addendum)
Self manual lymph drainage: Perform this sequence once a day.  Only give enough pressure no your skin to make the skin move.  Diaphragmatic - Supine   Inhale through nose making navel move out toward hands. Exhale through puckered lips, hands follow navel in. Repeat _5__ times. Rest _10__ seconds between repeats.   Copyright  VHI. All rights reserved.  Hug yourself.  Do circles at your neck just above your collarbones.  Repeat this 10 times.  Axilla - One at a Time   Using full weight of flat hand and fingers at center of uninvolved armpit, make _10__ in-place circles.   Copyright  VHI. All rights reserved.  LEG: Inguinal Nodes Stimulation   With small finger side of hand against hip crease on involved side, gently perform circles at the crease. Repeat __10_ times.   Copyright  VHI. All rights reserved.  1) Axilla to Inguinal Nodes - Sweep   On involved side, sweep _4__ times from armpit along side of trunk to hip crease.  Now gently stretch skin from the involved side to the uninvolved side across the chest at the shoulder line.  Repeat that 4 times.  Draw an imaginary diagonal line from upper outer breast through the nipple area toward lower inner breast.  Direct fluid upward and inward from this line toward the pathway across your upper chest .  Do this in three rows to treat all of the upper inner breast tissue, and do each row 3-4x.      Direct fluid to treat all of lower outer breast tissue downward and outward toward      pathway that is aimed at the left groin.  Finish by doing the pathways as described above going from your involved armpit to the same side groin and going across your upper chest from the involved shoulder to the uninvolved shoulder.  Repeat the steps above where you do circles in your right groin and left armpit. Copyright  VHI. All rights reserved.  

## 2018-02-19 ENCOUNTER — Telehealth: Payer: Self-pay | Admitting: Internal Medicine

## 2018-02-19 NOTE — Telephone Encounter (Signed)
Copied from [place]. Topic: Quick Communication - See Telephone Encounter >> Feb 19, 2018  2:20 PM Aurelio Brash B wrote: CRM for notification. See Telephone encounter for:  Pt states pharmacy does not have any more rx for the pts vit d,  pt is asking if she is suppose to keep taking it.   02/19/18.

## 2018-02-20 ENCOUNTER — Encounter: Payer: Medicare Other | Admitting: Physical Therapy

## 2018-02-20 ENCOUNTER — Telehealth: Payer: Self-pay | Admitting: Internal Medicine

## 2018-02-20 DIAGNOSIS — H02413 Mechanical ptosis of bilateral eyelids: Secondary | ICD-10-CM | POA: Diagnosis not present

## 2018-02-20 MED ORDER — OSELTAMIVIR PHOSPHATE 75 MG PO CAPS: 75.0000 mg | ORAL_CAPSULE | Freq: Every day | ORAL | 0 refills | Status: DC

## 2018-02-20 NOTE — Telephone Encounter (Signed)
Spoke to Ms [place].  Husband is in the hospital with flu.  Need prophylaxis.  Discussed with pt.  Instructed to take one per day for 10 days.  If symptoms, will change to treatment dose.  Call if problems.  Confirmed nkda.

## 2018-02-20 NOTE — Telephone Encounter (Signed)
rx sent in to [facility] for tamiflu.

## 2018-02-20 NOTE — Telephone Encounter (Signed)
Called patient and she states her eyes have been "watery" she states she has a slight cough, no congestion, no fevers.Patient states she was informed to start tamiflu due to exposure to the flu.Please advise. [facility].

## 2018-02-20 NOTE — Telephone Encounter (Signed)
Patient notified to take 2000 units OTC vitd daly as per lab note from 06/08/17.

## 2018-02-20 NOTE — Telephone Encounter (Signed)
Copied from [place]. Topic: Quick Communication - See Telephone Encounter >> Feb 20, 2018  9:15 AM Bea Graff, NT wrote: CRM for notification. See Telephone encounter for: Pt calling and states her husband is in the hospital with influenza A and the hospital told her to call her PCP to see if the can get the preventative tamiflu. Uses [facility] [address] in [place].   02/20/18.

## 2018-02-21 ENCOUNTER — Ambulatory Visit: Payer: Medicare Other

## 2018-02-25 ENCOUNTER — Encounter: Payer: Self-pay | Admitting: Physical Therapy

## 2018-02-25 ENCOUNTER — Ambulatory Visit: Payer: Medicare Other | Attending: Hematology and Oncology | Admitting: Physical Therapy

## 2018-02-25 DIAGNOSIS — M25611 Stiffness of right shoulder, not elsewhere classified: Secondary | ICD-10-CM | POA: Diagnosis not present

## 2018-02-25 DIAGNOSIS — I890 Lymphedema, not elsewhere classified: Secondary | ICD-10-CM | POA: Diagnosis not present

## 2018-02-25 DIAGNOSIS — M25511 Pain in right shoulder: Secondary | ICD-10-CM | POA: Diagnosis not present

## 2018-02-25 NOTE — Therapy (Signed)
[place], [place], [address] Phone: [phone]   Fax:  [phone]  Physical Therapy Treatment  Patient Details  Name: Martha [name] MRN: [id number] Date of Birth: December 22, 1951 Referring Provider: Dr. Lindi Adie   Encounter Date: 02/25/2018  PT End of Session - 02/25/18 1657    Visit Number  4    Number of Visits  11    Date for PT Re-Evaluation  03/13/18    PT Start Time  4481    PT Stop Time  1600    PT Time Calculation (min)  44 min    Activity Tolerance  Patient tolerated treatment well    Behavior During Therapy  [facility] for tasks assessed/performed       Past Medical History:  Diagnosis Date  . Arthritis   . 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 Cancer ([place]) 04/2017   right breast cancer  . Colon polyp 2008  . Diabetes mellitus without complication ([place])   . Endometrial hyperplasia 2013  . GERD (gastroesophageal reflux disease)    OCC-NO MEDS  . Headache    H/O MIGRAINES  . Hypertension   . Papilloma 11/20/2016   right breast  . Status post dilation of esophageal narrowing 2008    Past Surgical History:  Procedure Laterality Date  . BREAST BIOPSY Right 11/02/2016   ATYPICAL PAPILLARY LESION,  . BREAST EXCISIONAL BIOPSY Right 11/20/2016   AHD  . BREAST LUMPECTOMY Right 11/20/2016   Procedure: BREAST LUMPECTOMY;  Surgeon: [name], MD;  Location: [facility];  Service: General;  Laterality: Right;  . COLONOSCOPY  2008   in [place]  . DILATION AND CURETTAGE OF UTERUS    . RADIOACTIVE SEED GUIDED PARTIAL MASTECTOMY WITH AXILLARY SENTINEL LYMPH NODE BIOPSY Right 05/16/2017   Procedure: RIGHT RADIOACTIVE SEED GUIDED LUMPECTOMY  WITH  RIGHT BREAST SEED GUIDED EXCISIONAL BIOPSY AND RIGHT AXILLARY SENTINEL  NODE BIOPSY;  Surgeon: [name], MD;  Location: [place];  Service: General;  Laterality: Right;  2 SEEDS RIGHT BREAST  GENERAL AND  PEC BLOCK ANESHTESIA    There were no vitals filed for this visit.  Subjective Assessment - 02/25/18 1520    Subjective  I don't know if my swelling is better or maybe I am not doing it right.     Pertinent History  prior history of atypical ductal hyperplasia of the right breast who presented with a mammographic abnormality that led to ultrasound and a stereotactic biopsy on 04/20/2017. 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 HER-2 is pending, lumpectomy 05/16/17 with SLNB, pt completed radiation    Patient Stated Goals  to get my right breast swelling down and get back to exercising    Currently in Pain?  No/denies    Pain Score  0-No pain                      [facility] Adult PT Treatment/Exercise - 02/25/18 0001      Manual Therapy   Manual Therapy  Manual Lymphatic Drainage (MLD);Passive ROM    Manual Lymphatic Drainage (MLD)  In Supine: Short neck, 5 diaphragmatic breaths, Rt inguinal and Lt axillary nodes, Rt axillo-inguinal and Lt axillary anastomosis then focused on Rt breast and areas of fibrosis redirecting towards pathways beginning to instruct pt in sequencing throughout.    Passive ROM  to right shoulder in direction of flexion, abduction and ER to  pt's tolerance                  PT Long Term Goals - 02/25/18 1521      PT LONG TERM GOAL #1   Title  Pt will report a 50% decrease in right breast swelling to decrease risk of infection    Baseline  02/25/18- 15% improvement    Time  5    Period  Weeks    Status  On-going      PT LONG TERM GOAL #2   Title  Pt will demonstrate 160 degrees of right shoulder flexion to allow her to reach overhead    Baseline  146, 02/25/18- 145    Time  5    Period  Weeks    Status  On-going      PT LONG TERM GOAL #3   Title  Pt will demonstrate 160 degrees of right shoulder abduction to allow her to reach out to sides    Baseline  125, 02/25/18- 95    Time  5    Period  Weeks     Status  On-going      PT LONG TERM GOAL #4   Title  Pt will be independent in a home exercise program for continued strengthening and stretching    Time  5    Period  Weeks    Status  On-going      PT LONG TERM GOAL #5   Title  Pt will obtain appropriate compression garments for long term management of lymphedema.     Baseline  02/25/18- pt obtained a compression bra and wears it all the time    Time  5    Period  Weeks    Status  Achieved            Plan - 02/25/18 1657    Clinical Impression Statement  Began PROM to right shoulder today. Assessed pt's goals in therapy and she has not made progress towards her ROM goals. Her abduction was more limited today. Pt felt less tightness and pain following PROM. Also continued instruction in self MLD for right breast. Her right breast demonstrated softening of fibrosis by end of session.     Rehab Potential  Good    Clinical Impairments Affecting Rehab Potential  hx of radiation    PT Frequency  2x / week    PT Duration  -- 5 weeks    PT Treatment/Interventions  ADLs/Self Care Home Management;Therapeutic exercise;Patient/family education;Orthotic Fit/Training;Compression bandaging;Manual lymph drainage;Manual techniques;Passive range of motion;Taping;Scar mobilization;Vasopneumatic Device    PT Next Visit Plan  AA/A/PROM to right shoulder, Cont MLD to R breast having pt perform and review handout, also instruct husband if he comes, review supine dowel exercises.    Consulted and Agree with Plan of Care  Patient       Patient will benefit from skilled therapeutic intervention in order to improve the following deficits and impairments:  Increased fascial restricitons, Pain, Decreased scar mobility, Impaired UE functional use, Decreased strength, Decreased range of motion, Increased edema  Visit Diagnosis: Lymphedema, not elsewhere classified  Stiffness of right shoulder, not elsewhere classified  Acute pain of right  shoulder     Problem List Patient Active Problem List   Diagnosis Date Noted  . Use of anastrozole (Arimidex) 01/03/2018  . Localized maculopapular rash 06/16/2017  . Malignant neoplasm of upper-outer quadrant of right breast in female, estrogen receptor positive ([place]) 04/26/2017  . Low back pain 01/29/2017  .  Atypical ductal hyperplasia of right breast 01/15/2017  . Allergic rhinitis 04/12/2015  . Diabetes mellitus type 2, controlled ([place]) 07/24/2013  . Hyperlipidemia 07/24/2013  . Essential hypertension, benign 07/24/2013  . Status post dilation of esophageal narrowing 07/24/2013  . Obesity, unspecified 07/24/2013    Allyson Sabal [facility] 02/25/2018, 4:59 PM  [place], [place], [address] Phone: [phone]   Fax:  [phone]  Name: Martha [name] MRN: [id number] Date of Birth: 04-07-1951  Manus Gunning, PT 02/25/18 5:00 PM

## 2018-02-27 ENCOUNTER — Ambulatory Visit: Payer: Medicare Other | Admitting: Physical Therapy

## 2018-02-27 ENCOUNTER — Ambulatory Visit (INDEPENDENT_AMBULATORY_CARE_PROVIDER_SITE_OTHER): Payer: Medicare Other | Admitting: *Deleted

## 2018-02-27 ENCOUNTER — Encounter: Payer: Self-pay | Admitting: Physical Therapy

## 2018-02-27 DIAGNOSIS — M25611 Stiffness of right shoulder, not elsewhere classified: Secondary | ICD-10-CM | POA: Diagnosis not present

## 2018-02-27 DIAGNOSIS — E538 Deficiency of other specified B group vitamins: Secondary | ICD-10-CM

## 2018-02-27 DIAGNOSIS — I890 Lymphedema, not elsewhere classified: Secondary | ICD-10-CM

## 2018-02-27 DIAGNOSIS — M25511 Pain in right shoulder: Secondary | ICD-10-CM

## 2018-02-27 MED ORDER — CYANOCOBALAMIN 1000 MCG/ML IJ SOLN
1000.0000 ug | Freq: Once | INTRAMUSCULAR | Status: AC
  Administered 2018-02-27: 1000 ug via INTRAMUSCULAR

## 2018-02-27 NOTE — Progress Notes (Signed)
Patient presented for B 12 injection to left deltoid, patient voiced no concerns nor showed any signs of distress during injection. 

## 2018-02-27 NOTE — Therapy (Signed)
[place], [place], [address] Phone: [phone]   Fax:  [phone]  Physical Therapy Treatment  Patient Details  Name: Martha [name] MRN: [id number] Date of Birth: 10-03-1951 Referring Provider: Dr. Lindi Adie   Encounter Date: 02/27/2018  PT End of Session - 02/27/18 1602    Visit Number  5    Number of Visits  11    Date for PT Re-Evaluation  03/13/18    PT Start Time  1519    PT Stop Time  1602    PT Time Calculation (min)  43 min    Activity Tolerance  Patient tolerated treatment well    Behavior During Therapy  [facility] for tasks assessed/performed       Past Medical History:  Diagnosis Date  . Arthritis   . 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 Cancer ([place]) 04/2017   right breast cancer  . Colon polyp 2008  . Diabetes mellitus without complication ([place])   . Endometrial hyperplasia 2013  . GERD (gastroesophageal reflux disease)    OCC-NO MEDS  . Headache    H/O MIGRAINES  . Hypertension   . Papilloma 11/20/2016   right breast  . Status post dilation of esophageal narrowing 2008    Past Surgical History:  Procedure Laterality Date  . BREAST BIOPSY Right 11/02/2016   ATYPICAL PAPILLARY LESION,  . BREAST EXCISIONAL BIOPSY Right 11/20/2016   AHD  . BREAST LUMPECTOMY Right 11/20/2016   Procedure: BREAST LUMPECTOMY;  Surgeon: [name], MD;  Location: [facility];  Service: General;  Laterality: Right;  . COLONOSCOPY  2008   in [place]  . DILATION AND CURETTAGE OF UTERUS    . RADIOACTIVE SEED GUIDED PARTIAL MASTECTOMY WITH AXILLARY SENTINEL LYMPH NODE BIOPSY Right 05/16/2017   Procedure: RIGHT RADIOACTIVE SEED GUIDED LUMPECTOMY  WITH  RIGHT BREAST SEED GUIDED EXCISIONAL BIOPSY AND RIGHT AXILLARY SENTINEL  NODE BIOPSY;  Surgeon: [name], MD;  Location: [place];  Service: General;  Laterality: Right;  2 SEEDS RIGHT BREAST  GENERAL AND  PEC BLOCK ANESHTESIA    There were no vitals filed for this visit.  Subjective Assessment - 02/27/18 1521    Subjective  I think my swelling is a little better. I have been using the left hand more and I think I am doing better at the self massage. I have been stretching my arm.     Pertinent History  prior history of atypical ductal hyperplasia of the right breast who presented with a mammographic abnormality that led to ultrasound and a stereotactic biopsy on 04/20/2017. 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 HER-2 is pending, lumpectomy 05/16/17 with SLNB, pt completed radiation    Patient Stated Goals  to get my right breast swelling down and get back to exercising    Currently in Pain?  No/denies    Pain Score  0-No pain                      [facility] Adult PT Treatment/Exercise - 02/27/18 0001      Shoulder Exercises: Pulleys   Flexion  2 minutes    ABduction  2 minutes      Shoulder Exercises: Therapy Ball   Flexion  10 reps with stretch at end range      Manual Therapy   Manual Therapy  Manual Lymphatic Drainage (MLD);Passive ROM    Manual Lymphatic  Drainage (MLD)  In Supine: Short neck, 5 diaphragmatic breaths, Rt inguinal and Lt axillary nodes, Rt axillo-inguinal and Lt axillary anastomosis then focused on Rt breast and areas of fibrosis redirecting towards pathways beginning to instruct pt in sequencing throughout.    Passive ROM  --                  PT Long Term Goals - 02/25/18 1521      PT LONG TERM GOAL #1   Title  Pt will report a 50% decrease in right breast swelling to decrease risk of infection    Baseline  02/25/18- 15% improvement    Time  5    Period  Weeks    Status  On-going      PT LONG TERM GOAL #2   Title  Pt will demonstrate 160 degrees of right shoulder flexion to allow her to reach overhead    Baseline  146, 02/25/18- 145    Time  5    Period  Weeks    Status  On-going      PT LONG  TERM GOAL #3   Title  Pt will demonstrate 160 degrees of right shoulder abduction to allow her to reach out to sides    Baseline  125, 02/25/18- 95    Time  5    Period  Weeks    Status  On-going      PT LONG TERM GOAL #4   Title  Pt will be independent in a home exercise program for continued strengthening and stretching    Time  5    Period  Weeks    Status  On-going      PT LONG TERM GOAL #5   Title  Pt will obtain appropriate compression garments for long term management of lymphedema.     Baseline  02/25/18- pt obtained a compression bra and wears it all the time    Time  5    Period  Weeks    Status  Achieved            Plan - 02/27/18 1639    Clinical Impression Statement  Added AAROM exercises today including pulleys and therapy ball to pt's home exercise program. Her ROM is improving per visual estimate and she reports being compliant with her home exercise program. Her right breast softened significantly following MLD today.     Rehab Potential  Good    Clinical Impairments Affecting Rehab Potential  hx of radiation    PT Frequency  2x / week    PT Duration  -- 5 weeks    PT Treatment/Interventions  ADLs/Self Care Home Management;Therapeutic exercise;Patient/family education;Orthotic Fit/Training;Compression bandaging;Manual lymph drainage;Manual techniques;Passive range of motion;Taping;Scar mobilization;Vasopneumatic Device    PT Next Visit Plan  give supine scap, AA/A/PROM to right shoulder, Cont MLD to R breast having pt perform and review handout, also instruct husband if he comes, review supine dowel exercises.    Consulted and Agree with Plan of Care  Patient       Patient will benefit from skilled therapeutic intervention in order to improve the following deficits and impairments:  Increased fascial restricitons, Pain, Decreased scar mobility, Impaired UE functional use, Decreased strength, Decreased range of motion, Increased edema  Visit  Diagnosis: Lymphedema, not elsewhere classified  Stiffness of right shoulder, not elsewhere classified  Acute pain of right shoulder     Problem List Patient Active Problem List   Diagnosis Date Noted  . Use of  anastrozole (Arimidex) 01/03/2018  . Localized maculopapular rash 06/16/2017  . Malignant neoplasm of upper-outer quadrant of right breast in female, estrogen receptor positive ([place]) 04/26/2017  . Low back pain 01/29/2017  . Atypical ductal hyperplasia of right breast 01/15/2017  . Allergic rhinitis 04/12/2015  . Diabetes mellitus type 2, controlled ([place]) 07/24/2013  . Hyperlipidemia 07/24/2013  . Essential hypertension, benign 07/24/2013  . Status post dilation of esophageal narrowing 07/24/2013  . Obesity, unspecified 07/24/2013    Allyson Sabal [facility] 02/27/2018, 4:44 PM  [place], [place], [address] Phone: [phone]   Fax:  [phone]  Name: VIOLETTE MORNEAULT MRN: [id number] Date of Birth: 1951-11-16  Manus Gunning, PT 02/27/18 4:44 PM

## 2018-03-11 ENCOUNTER — Ambulatory Visit: Payer: Medicare Other | Admitting: Physical Therapy

## 2018-03-11 ENCOUNTER — Encounter: Payer: Self-pay | Admitting: Physical Therapy

## 2018-03-11 DIAGNOSIS — I890 Lymphedema, not elsewhere classified: Secondary | ICD-10-CM | POA: Diagnosis not present

## 2018-03-11 DIAGNOSIS — M25611 Stiffness of right shoulder, not elsewhere classified: Secondary | ICD-10-CM | POA: Diagnosis not present

## 2018-03-11 DIAGNOSIS — M25511 Pain in right shoulder: Secondary | ICD-10-CM | POA: Diagnosis not present

## 2018-03-11 NOTE — Therapy (Signed)
[place], [place], [address] Phone: [phone]   Fax:  [phone]  Physical Therapy Treatment  Patient Details  Name: Martha [name] MRN: [id number] Date of Birth: 03-Mar-1951 Referring Provider: Dr. Lindi Adie   Encounter Date: 03/11/2018  PT End of Session - 03/11/18 1442    Visit Number  6    Number of Visits  11    Date for PT Re-Evaluation  03/13/18    PT Start Time  7017    PT Stop Time  1440    PT Time Calculation (min)  44 min    Activity Tolerance  Patient tolerated treatment well    Behavior During Therapy  [facility] for tasks assessed/performed       Past Medical History:  Diagnosis Date  . Arthritis   . 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 Cancer ([place]) 04/2017   right breast cancer  . Colon polyp 2008  . Diabetes mellitus without complication ([place])   . Endometrial hyperplasia 2013  . GERD (gastroesophageal reflux disease)    OCC-NO MEDS  . Headache    H/O MIGRAINES  . Hypertension   . Papilloma 11/20/2016   right breast  . Status post dilation of esophageal narrowing 2008    Past Surgical History:  Procedure Laterality Date  . BREAST BIOPSY Right 11/02/2016   ATYPICAL PAPILLARY LESION,  . BREAST EXCISIONAL BIOPSY Right 11/20/2016   AHD  . BREAST LUMPECTOMY Right 11/20/2016   Procedure: BREAST LUMPECTOMY;  Surgeon: [name], MD;  Location: [facility];  Service: General;  Laterality: Right;  . COLONOSCOPY  2008   in [place]  . DILATION AND CURETTAGE OF UTERUS    . RADIOACTIVE SEED GUIDED PARTIAL MASTECTOMY WITH AXILLARY SENTINEL LYMPH NODE BIOPSY Right 05/16/2017   Procedure: RIGHT RADIOACTIVE SEED GUIDED LUMPECTOMY  WITH  RIGHT BREAST SEED GUIDED EXCISIONAL BIOPSY AND RIGHT AXILLARY SENTINEL  NODE BIOPSY;  Surgeon: [name], MD;  Location: [place];  Service: General;  Laterality: Right;  2 SEEDS RIGHT BREAST  GENERAL AND  PEC BLOCK ANESHTESIA    There were no vitals filed for this visit.  Subjective Assessment - 03/11/18 1358    Subjective  Some days I think the swelling is better and other days it gets hard again.     Pertinent History  prior history of atypical ductal hyperplasia of the right breast who presented with a mammographic abnormality that led to ultrasound and a stereotactic biopsy on 04/20/2017. 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 HER-2 is pending, lumpectomy 05/16/17 with SLNB, pt completed radiation    Patient Stated Goals  to get my right breast swelling down and get back to exercising    Currently in Pain?  No/denies    Pain Score  0-No pain                      [facility] Adult PT Treatment/Exercise - 03/11/18 0001      Manual Therapy   Manual Therapy  Manual Lymphatic Drainage (MLD);Passive ROM    Manual Lymphatic Drainage (MLD)  In Supine: Short neck, 5 diaphragmatic breaths, Rt inguinal and Lt axillary nodes, Rt axillo-inguinal and Lt axillary anastomosis then focused on Rt breast and areas of fibrosis redirecting towards pathways beginning to instruct pt in sequencing throughout.                  PT  Long Term Goals - 02/25/18 1521      PT LONG TERM GOAL #1   Title  Pt will report a 50% decrease in right breast swelling to decrease risk of infection    Baseline  02/25/18- 15% improvement    Time  5    Period  Weeks    Status  On-going      PT LONG TERM GOAL #2   Title  Pt will demonstrate 160 degrees of right shoulder flexion to allow her to reach overhead    Baseline  146, 02/25/18- 145    Time  5    Period  Weeks    Status  On-going      PT LONG TERM GOAL #3   Title  Pt will demonstrate 160 degrees of right shoulder abduction to allow her to reach out to sides    Baseline  125, 02/25/18- 95    Time  5    Period  Weeks    Status  On-going      PT LONG TERM GOAL #4   Title  Pt will be independent in a home  exercise program for continued strengthening and stretching    Time  5    Period  Weeks    Status  On-going      PT LONG TERM GOAL #5   Title  Pt will obtain appropriate compression garments for long term management of lymphedema.     Baseline  02/25/18- pt obtained a compression bra and wears it all the time    Time  5    Period  Weeks    Status  Achieved            Plan - 03/11/18 1442    Clinical Impression Statement  Pt states she has supine scapular exercises from when she was here for a previous episode and reports independence with these so they were not instructed today. Focus today was on right breast swelling and decreasing areas of fibrosis. Pt's breast softened during treatment session today.     Rehab Potential  Good    Clinical Impairments Affecting Rehab Potential  hx of radiation    PT Frequency  2x / week    PT Duration  -- 5 weeks    PT Treatment/Interventions  ADLs/Self Care Home Management;Therapeutic exercise;Patient/family education;Orthotic Fit/Training;Compression bandaging;Manual lymph drainage;Manual techniques;Passive range of motion;Taping;Scar mobilization;Vasopneumatic Device    PT Next Visit Plan   AA/A/PROM to right shoulder, Cont MLD to R breast having pt perform and review handout, also instruct husband if he comes, review supine dowel exercises.    PT Home Exercise Plan  self MLD    Consulted and Agree with Plan of Care  Patient       Patient will benefit from skilled therapeutic intervention in order to improve the following deficits and impairments:  Increased fascial restricitons, Pain, Decreased scar mobility, Impaired UE functional use, Decreased strength, Decreased range of motion, Increased edema  Visit Diagnosis: Lymphedema, not elsewhere classified     Problem List Patient Active Problem List   Diagnosis Date Noted  . Use of anastrozole (Arimidex) 01/03/2018  . Localized maculopapular rash 06/16/2017  . Malignant neoplasm of  upper-outer quadrant of right breast in female, estrogen receptor positive ([place]) 04/26/2017  . Low back pain 01/29/2017  . Atypical ductal hyperplasia of right breast 01/15/2017  . Allergic rhinitis 04/12/2015  . Diabetes mellitus type 2, controlled ([place]) 07/24/2013  . Hyperlipidemia 07/24/2013  . Essential hypertension, benign 07/24/2013  .  Status post dilation of esophageal narrowing 07/24/2013  . Obesity, unspecified 07/24/2013    Allyson Sabal [facility] 03/11/2018, 2:44 PM  [place], [place], [address] Phone: [phone]   Fax:  [phone]  Name: Martha [name] MRN: [id number] Date of Birth: 21-Dec-1951  Manus Gunning, PT 03/11/18 2:45 PM

## 2018-03-13 ENCOUNTER — Encounter: Payer: Medicare Other | Admitting: Physical Therapy

## 2018-03-13 DIAGNOSIS — H02413 Mechanical ptosis of bilateral eyelids: Secondary | ICD-10-CM | POA: Diagnosis not present

## 2018-03-13 DIAGNOSIS — H02412 Mechanical ptosis of left eyelid: Secondary | ICD-10-CM | POA: Diagnosis not present

## 2018-03-13 DIAGNOSIS — H02411 Mechanical ptosis of right eyelid: Secondary | ICD-10-CM | POA: Diagnosis not present

## 2018-03-13 DIAGNOSIS — H02403 Unspecified ptosis of bilateral eyelids: Secondary | ICD-10-CM | POA: Diagnosis not present

## 2018-03-19 ENCOUNTER — Ambulatory Visit: Payer: Medicare Other | Admitting: Internal Medicine

## 2018-03-19 DIAGNOSIS — Z0289 Encounter for other administrative examinations: Secondary | ICD-10-CM

## 2018-03-20 ENCOUNTER — Ambulatory Visit: Payer: Medicare Other

## 2018-03-21 ENCOUNTER — Ambulatory Visit: Payer: Medicare Other | Admitting: Physical Therapy

## 2018-03-24 ENCOUNTER — Other Ambulatory Visit: Payer: Self-pay | Admitting: Internal Medicine

## 2018-03-25 ENCOUNTER — Encounter: Payer: Self-pay | Admitting: Physical Therapy

## 2018-03-25 ENCOUNTER — Ambulatory Visit: Payer: Medicare Other | Attending: Hematology and Oncology | Admitting: Physical Therapy

## 2018-03-25 DIAGNOSIS — M25611 Stiffness of right shoulder, not elsewhere classified: Secondary | ICD-10-CM | POA: Insufficient documentation

## 2018-03-25 DIAGNOSIS — M25511 Pain in right shoulder: Secondary | ICD-10-CM | POA: Diagnosis not present

## 2018-03-25 DIAGNOSIS — M6281 Muscle weakness (generalized): Secondary | ICD-10-CM | POA: Insufficient documentation

## 2018-03-25 DIAGNOSIS — I890 Lymphedema, not elsewhere classified: Secondary | ICD-10-CM | POA: Insufficient documentation

## 2018-03-25 NOTE — Therapy (Signed)
[place], [place], [address] Phone: [phone]   Fax:  [phone]  Physical Therapy Treatment  Patient Details  Name: Martha [name] MRN: [id number] Date of Birth: Jan 10, 1951 Referring Provider: Dr. Lindi Adie   Encounter Date: 03/25/2018  PT End of Session - 03/25/18 1653    Visit Number  7    Number of Visits  15    Date for PT Re-Evaluation  04/22/18    PT Start Time  7989 pt arrived late    PT Stop Time  1603    PT Time Calculation (min)  40 min    Activity Tolerance  Patient tolerated treatment well    Behavior During Therapy  [facility] for tasks assessed/performed       Past Medical History:  Diagnosis Date  . Arthritis   . 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 Cancer ([place]) 04/2017   right breast cancer  . Colon polyp 2008  . Diabetes mellitus without complication ([place])   . Endometrial hyperplasia 2013  . GERD (gastroesophageal reflux disease)    OCC-NO MEDS  . Headache    H/O MIGRAINES  . Hypertension   . Papilloma 11/20/2016   right breast  . Status post dilation of esophageal narrowing 2008    Past Surgical History:  Procedure Laterality Date  . BREAST BIOPSY Right 11/02/2016   ATYPICAL PAPILLARY LESION,  . BREAST EXCISIONAL BIOPSY Right 11/20/2016   AHD  . BREAST LUMPECTOMY Right 11/20/2016   Procedure: BREAST LUMPECTOMY;  Surgeon: [name], MD;  Location: [facility] ORS;  Service: General;  Laterality: Right;  . COLONOSCOPY  2008   in [place]  . DILATION AND CURETTAGE OF UTERUS    . RADIOACTIVE SEED GUIDED PARTIAL MASTECTOMY WITH AXILLARY SENTINEL LYMPH NODE BIOPSY Right 05/16/2017   Procedure: RIGHT RADIOACTIVE SEED GUIDED LUMPECTOMY  WITH  RIGHT BREAST SEED GUIDED EXCISIONAL BIOPSY AND RIGHT AXILLARY SENTINEL  NODE BIOPSY;  Surgeon: [name], MD;  Location: [facility];  Service: General;  Laterality: Right;  2 SEEDS RIGHT  BREAST  GENERAL AND PEC BLOCK ANESHTESIA    There were no vitals filed for this visit.  Subjective Assessment - 03/25/18 1525    Subjective  The last few nights I have been sleeping in my abdominal binder and sometimes I put the chip pack underneath. The swelling is there but it isn't as hard.     Pertinent History  prior history of atypical ductal hyperplasia of the right breast who presented with a mammographic abnormality that led to ultrasound and a stereotactic biopsy on 04/20/2017. 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 HER-2 is pending, lumpectomy 05/16/17 with SLNB, pt completed radiation    Patient Stated Goals  to get my right breast swelling down and get back to exercising    Currently in Pain?  No/denies    Pain Score  0-No pain                       [facility] Adult PT Treatment/Exercise - 03/25/18 0001      Manual Therapy   Manual Therapy  Manual Lymphatic Drainage (MLD);Passive ROM    Manual Lymphatic Drainage (MLD)  In Supine: Short neck, superficial and deep abdominals, Rt inguinal and Lt axillary nodes, Rt axillo-inguinal and Lt axillary anastomosis then focused on Rt breast and areas of fibrosis redirecting towards pathways beginning to instruct pt in sequencing  throughout.                  PT Long Term Goals - 03/25/18 1527      PT LONG TERM GOAL #1   Title  Pt will report a 50% decrease in right breast swelling to decrease risk of infection    Baseline  02/25/18- 15% improvement, 03/25/18- 15% improvement    Time  5    Period  Weeks    Status  On-going      PT LONG TERM GOAL #2   Title  Pt will demonstrate 160 degrees of right shoulder flexion to allow her to reach overhead    Baseline  146, 02/25/18- 145, 03/25/18- 161    Time  5    Period  Weeks    Status  Achieved      PT LONG TERM GOAL #3   Title  Pt will demonstrate 160 degrees of right shoulder abduction to allow her to reach out to sides     Baseline  125, 02/25/18- 95, 03/25/18- 145    Time  5    Period  Weeks    Status  On-going      PT LONG TERM GOAL #4   Title  Pt will be independent in a home exercise program for continued strengthening and stretching    Time  5    Period  Weeks    Status  On-going      PT LONG TERM GOAL #5   Title  Pt will obtain appropriate compression garments for long term management of lymphedema.     Baseline  02/25/18- pt obtained a compression bra and wears it all the time    Time  5    Period  Weeks    Status  Achieved            Plan - 03/25/18 1530    Clinical Impression Statement  Pt has met her shoulder flexion ROM goal and has progressed significantly towards her abduction ROM goal. She is still having swelling in right breast but reports it is not getting worse and it is softer than it was. Continued with MLD today to right breast. Pt would benefit from additional skilled PT visits to continue to improve R shoulder ROM and decrease right breast swelling.     Rehab Potential  Good    Clinical Impairments Affecting Rehab Potential  hx of radiation    PT Frequency  1x / week 2x/wk this week decreasing to 1x/wk for the next 3 wks    PT Duration  4 weeks    PT Treatment/Interventions  ADLs/Self Care Home Management;Therapeutic exercise;Patient/family education;Orthotic Fit/Training;Compression bandaging;Manual lymph drainage;Manual techniques;Passive range of motion;Taping;Scar mobilization;Vasopneumatic Device    PT Next Visit Plan   AA/A/PROM to right shoulder with focus on abduction, Cont MLD to R breast having pt perform and review handout, also instruct husband if he comes, review supine dowel exercises.    PT Home Exercise Plan  self MLD    Consulted and Agree with Plan of Care  Patient       Patient will benefit from skilled therapeutic intervention in order to improve the following deficits and impairments:  Increased fascial restricitons, Pain, Decreased scar mobility, Impaired UE  functional use, Decreased strength, Decreased range of motion, Increased edema  Visit Diagnosis: Lymphedema, not elsewhere classified     Problem List Patient Active Problem List   Diagnosis Date Noted  . Use of anastrozole (Arimidex) 01/03/2018  .  Localized maculopapular rash 06/16/2017  . Malignant neoplasm of upper-outer quadrant of right breast in female, estrogen receptor positive ([place]) 04/26/2017  . Low back pain 01/29/2017  . Atypical ductal hyperplasia of right breast 01/15/2017  . Allergic rhinitis 04/12/2015  . Diabetes mellitus type 2, controlled ([place]) 07/24/2013  . Hyperlipidemia 07/24/2013  . Essential hypertension, benign 07/24/2013  . Status post dilation of esophageal narrowing 07/24/2013  . Obesity, unspecified 07/24/2013    Allyson Sabal [facility] 03/25/2018, 4:57 PM  [place], [place], [address] Phone: [phone]   Fax:  [phone]  Name: Martha [name] MRN: [id number] Date of Birth: 10-05-1951  Manus Gunning, PT 03/25/18 4:57 PM

## 2018-03-27 ENCOUNTER — Encounter: Payer: Self-pay | Admitting: Physical Therapy

## 2018-03-27 ENCOUNTER — Ambulatory Visit: Payer: Medicare Other | Admitting: Physical Therapy

## 2018-03-27 DIAGNOSIS — M6281 Muscle weakness (generalized): Secondary | ICD-10-CM | POA: Diagnosis not present

## 2018-03-27 DIAGNOSIS — M25611 Stiffness of right shoulder, not elsewhere classified: Secondary | ICD-10-CM | POA: Diagnosis not present

## 2018-03-27 DIAGNOSIS — I890 Lymphedema, not elsewhere classified: Secondary | ICD-10-CM | POA: Diagnosis not present

## 2018-03-27 DIAGNOSIS — M25511 Pain in right shoulder: Secondary | ICD-10-CM | POA: Diagnosis not present

## 2018-03-27 NOTE — Therapy (Signed)
[place], [place], [address] Phone: [phone]   Fax:  [phone]  Physical Therapy Treatment  Patient Details  Name: Martha [name] MRN: [id number] Date of Birth: [birth year] Referring Provider: Dr. Lindi Adie   Encounter Date: 03/27/2018  PT End of Session - 03/27/18 0937    Visit Number  8    Number of Visits  15    Date for PT Re-Evaluation  04/22/18    PT Start Time  6579 pt arrived late    PT Stop Time  0934    PT Time Calculation (min)  37 min    Activity Tolerance  Patient tolerated treatment well    Behavior During Therapy  [facility] for tasks assessed/performed       Past Medical History:  Diagnosis Date  . Arthritis   . 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 Cancer ([place]) 04/2017   right breast cancer  . Colon polyp 2008  . Diabetes mellitus without complication ([place])   . Endometrial hyperplasia 2013  . GERD (gastroesophageal reflux disease)    OCC-NO MEDS  . Headache    H/O MIGRAINES  . Hypertension   . Papilloma 11/20/2016   right breast  . Status post dilation of esophageal narrowing 2008    Past Surgical History:  Procedure Laterality Date  . BREAST BIOPSY Right 11/02/2016   ATYPICAL PAPILLARY LESION,  . BREAST EXCISIONAL BIOPSY Right 11/20/2016   AHD  . BREAST LUMPECTOMY Right 11/20/2016   Procedure: BREAST LUMPECTOMY;  Surgeon: [name], MD;  Location: [facility];  Service: General;  Laterality: Right;  . COLONOSCOPY  2008   in [place]  . DILATION AND CURETTAGE OF UTERUS    . RADIOACTIVE SEED GUIDED PARTIAL MASTECTOMY WITH AXILLARY SENTINEL LYMPH NODE BIOPSY Right 05/16/2017   Procedure: RIGHT RADIOACTIVE SEED GUIDED LUMPECTOMY  WITH  RIGHT BREAST SEED GUIDED EXCISIONAL BIOPSY AND RIGHT AXILLARY SENTINEL  NODE BIOPSY;  Surgeon: [name], MD;  Location: [facility];  Service: General;  Laterality: Right;  2 SEEDS RIGHT  BREAST  GENERAL AND PEC BLOCK ANESHTESIA    There were no vitals filed for this visit.  Subjective Assessment - 03/27/18 0857    Subjective  My breast swelling is not bad today.     Pertinent History  prior history of atypical ductal hyperplasia of the right breast who presented with a mammographic abnormality that led to ultrasound and a stereotactic biopsy on 04/20/2017. 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 HER-2 is pending, lumpectomy 05/16/17 with SLNB, pt completed radiation    Patient Stated Goals  to get my right breast swelling down and get back to exercising    Currently in Pain?  No/denies    Pain Score  0-No pain                       [facility] Adult PT Treatment/Exercise - 03/27/18 0001      Shoulder Exercises: Pulleys   ABduction  2 minutes      Shoulder Exercises: Therapy Ball   Flexion  10 reps with stretch at end range    ABduction  Right;10 reps with stretch at end range      Manual Therapy   Passive ROM  PROM to R shoulder in direction of flexion, abduction and ER with prolonged holds  PT Long Term Goals - 03/25/18 1527      PT LONG TERM GOAL #1   Title  Pt will report a 50% decrease in right breast swelling to decrease risk of infection    Baseline  02/25/18- 15% improvement, 03/25/18- 15% improvement    Time  5    Period  Weeks    Status  On-going      PT LONG TERM GOAL #2   Title  Pt will demonstrate 160 degrees of right shoulder flexion to allow her to reach overhead    Baseline  146, 02/25/18- 145, 03/25/18- 161    Time  5    Period  Weeks    Status  Achieved      PT LONG TERM GOAL #3   Title  Pt will demonstrate 160 degrees of right shoulder abduction to allow her to reach out to sides    Baseline  125, 02/25/18- 95, 03/25/18- 145    Time  5    Period  Weeks    Status  On-going      PT LONG TERM GOAL #4   Title  Pt will be independent in a home exercise program for  continued strengthening and stretching    Time  5    Period  Weeks    Status  On-going      PT LONG TERM GOAL #5   Title  Pt will obtain appropriate compression garments for long term management of lymphedema.     Baseline  02/25/18- pt obtained a compression bra and wears it all the time    Time  5    Period  Weeks    Status  Achieved            Plan - 03/27/18 1610    Clinical Impression Statement  Focused today on increasing R shoulder ROM through AAROM exercises and passive stretching. Pt reports tightness and discomfort with stretching but it is tolerable. PROM gained during this session per visual estimate.    Rehab Potential  Good    Clinical Impairments Affecting Rehab Potential  hx of radiation    PT Frequency  1x / week    PT Duration  4 weeks    PT Treatment/Interventions  ADLs/Self Care Home Management;Therapeutic exercise;Patient/family education;Orthotic Fit/Training;Compression bandaging;Manual lymph drainage;Manual techniques;Passive range of motion;Taping;Scar mobilization;Vasopneumatic Device    PT Next Visit Plan   AA/A/PROM to right shoulder with focus on abduction, Cont MLD to R breast having pt perform and review handout, also instruct husband if he comes, review supine dowel exercises.    PT Home Exercise Plan  self MLD    Consulted and Agree with Plan of Care  Patient       Patient will benefit from skilled therapeutic intervention in order to improve the following deficits and impairments:  Increased fascial restricitons, Pain, Decreased scar mobility, Impaired UE functional use, Decreased strength, Decreased range of motion, Increased edema  Visit Diagnosis: Stiffness of right shoulder, not elsewhere classified  Acute pain of right shoulder     Problem List Patient Active Problem List   Diagnosis Date Noted  . Use of anastrozole (Arimidex) 01/03/2018  . Localized maculopapular rash 06/16/2017  . Malignant neoplasm of upper-outer quadrant of right  breast in female, estrogen receptor positive ([place]) 04/26/2017  . Low back pain 01/29/2017  . Atypical ductal hyperplasia of right breast 01/15/2017  . Allergic rhinitis 04/12/2015  . Diabetes mellitus type 2, controlled ([place]) 07/24/2013  . Hyperlipidemia 07/24/2013  .  Essential hypertension, benign 07/24/2013  . Status post dilation of esophageal narrowing 07/24/2013  . Obesity, unspecified 07/24/2013    Allyson Sabal [facility] 03/27/2018, 9:40 AM  [place], [place], [address] Phone: [phone]   Fax:  [phone]  Name: AIDYNN POLENDO MRN: [id number] Date of Birth: [birth year]  Manus Gunning, PT 03/27/18 9:40 AM

## 2018-04-01 ENCOUNTER — Telehealth: Payer: Self-pay | Admitting: Internal Medicine

## 2018-04-01 LAB — HM DIABETES EYE EXAM

## 2018-04-01 MED ORDER — CYCLOBENZAPRINE HCL 10 MG PO TABS: 10.0000 mg | ORAL_TABLET | Freq: Three times a day (TID) | ORAL | 1 refills | Status: DC | PRN

## 2018-04-01 NOTE — Telephone Encounter (Signed)
Cyclobenzaprine  Refilled: 01/29/2017 by Dr. Lacinda Axon Last OV: 01/01/2018 Next OV: 05/08/2018

## 2018-04-01 NOTE — Telephone Encounter (Signed)
I looked in patients medication list and I don't see where this was prescribed. Please advise.

## 2018-04-01 NOTE — Telephone Encounter (Signed)
Copied from [place] [id number]. Topic: Quick Communication - Rx Refill/Question >> Apr 01, 2018 10:46 AM Scherrie Gerlach wrote: Medication: cyclobenzaprine (FLEXERIL) 10 MG tablet  Has the patient contacted their pharmacy? no  Pt had this prescribed by Dr Lacinda Axon, and has not had for a while. 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 But this Rx helps her.  Hopes Dr Derrel Nip can send in as she does not have any appts.  [id number] [address], [place] [phone] (Phone) [phone] (Fax)

## 2018-04-01 NOTE — Telephone Encounter (Signed)
Flexeril sent to mail order

## 2018-04-01 NOTE — Telephone Encounter (Signed)
Pt needs med for her back pain

## 2018-04-02 ENCOUNTER — Ambulatory Visit: Payer: Medicare Other

## 2018-04-02 DIAGNOSIS — M25511 Pain in right shoulder: Secondary | ICD-10-CM | POA: Diagnosis not present

## 2018-04-02 DIAGNOSIS — M25611 Stiffness of right shoulder, not elsewhere classified: Secondary | ICD-10-CM

## 2018-04-02 DIAGNOSIS — I890 Lymphedema, not elsewhere classified: Secondary | ICD-10-CM

## 2018-04-02 DIAGNOSIS — M6281 Muscle weakness (generalized): Secondary | ICD-10-CM | POA: Diagnosis not present

## 2018-04-02 NOTE — Therapy (Signed)
[place], [place], [address] Phone: [phone]   Fax:  [phone]  Physical Therapy Treatment  Patient Details  Name: Martha [name] MRN: [id number] Date of Birth: March 26, 1951 Referring Provider: Dr. Lindi Adie   Encounter Date: 04/02/2018  PT End of Session - 04/02/18 0900    Visit Number  9    Number of Visits  15    Date for PT Re-Evaluation  04/22/18    PT Start Time  0851    PT Stop Time  0931    PT Time Calculation (min)  40 min    Activity Tolerance  Patient tolerated treatment well    Behavior During Therapy  [facility] for tasks assessed/performed       Past Medical History:  Diagnosis Date  . Arthritis   . 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 Cancer ([place]) 04/2017   right breast cancer  . Colon polyp 2008  . Diabetes mellitus without complication ([place])   . Endometrial hyperplasia 2013  . GERD (gastroesophageal reflux disease)    OCC-NO MEDS  . Headache    H/O MIGRAINES  . Hypertension   . Papilloma 11/20/2016   right breast  . Status post dilation of esophageal narrowing 2008    Past Surgical History:  Procedure Laterality Date  . BREAST BIOPSY Right 11/02/2016   ATYPICAL PAPILLARY LESION,  . BREAST EXCISIONAL BIOPSY Right 11/20/2016   AHD  . BREAST LUMPECTOMY Right 11/20/2016   Procedure: BREAST LUMPECTOMY;  Surgeon: [name], MD;  Location: [facility] OR[facility];  Service: General;  Laterality: Right;  . COLONOSCOPY  2008   in [place]  . DILATION AND CURETTAGE OF UTERUS    . RADIOACTIVE SEED GUIDED PARTIAL MASTECTOMY WITH AXILLARY SENTINEL LYMPH NODE BIOPSY Right 05/16/2017   Procedure: RIGHT RADIOACTIVE SEED GUIDED LUMPECTOMY  WITH  RIGHT BREAST SEED GUIDED EXCISIONAL BIOPSY AND RIGHT AXILLARY SENTINEL  NODE BIOPSY;  Surgeon: [name], MD;  Location: [facility];  Service: General;  Laterality: Right;  2 SEEDS RIGHT BREAST  GENERAL AND  PEC BLOCK ANESHTESIA    There were no vitals filed for this visit.  Subjective Assessment - 04/02/18 0854    Subjective  My breast swelling still seems less than it was, though definitely not gone. My Rt shoulder was sore for about a day after last visit, but it felt good to be stretched. My back has been bothering me some with straightening my house up lately. It "goes out on me" once or so a year.     Pertinent History  prior history of atypical ductal hyperplasia of the right breast who presented with a mammographic abnormality that led to ultrasound and a stereotactic biopsy on 04/20/2017. 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 HER-2 is pending, lumpectomy 05/16/17 with SLNB, pt completed radiation    Patient Stated Goals  to get my right breast swelling down and get back to exercising    Currently in Pain?  No/denies         [facility] PT Assessment - 04/02/18 0001      AROM   Right Shoulder Flexion  151 Degrees    Right Shoulder ABduction  134 Degrees    Right Shoulder Internal Rotation  49 Degrees    Right Shoulder External Rotation  76 Degrees                   [facility] Adult  PT Treatment/Exercise - 04/02/18 0001      Shoulder Exercises: Pulleys   Flexion  1 minute 1:30 mins    ABduction  1 minute 1:30 mins      Shoulder Exercises: Therapy Ball   Flexion  10 reps Forward lean into end of stretch    ABduction  Right;10 reps Same side lean into end of stretch      Manual Therapy   Manual Lymphatic Drainage (MLD)  In Supine: Short neck, 5 diaphragmatic breaths, Rt inguinal and Lt axillary nodes, Rt axillo-inguinal and Lt axillary anastomosis then focused on Rt breast and areas of fibrosis redirecting towards pathways reviewing with pt and having her return demonstration.     Passive ROM  PROM to Rt shoulder in direction of flexion, abduction, ER, and D2 with prolonged holds and stabilizing scapula in depression for flexion stretches                   PT Long Term Goals - 03/25/18 1527      PT LONG TERM GOAL #1   Title  Pt will report a 50% decrease in right breast swelling to decrease risk of infection    Baseline  02/25/18- 15% improvement, 03/25/18- 15% improvement    Time  5    Period  Weeks    Status  On-going      PT LONG TERM GOAL #2   Title  Pt will demonstrate 160 degrees of right shoulder flexion to allow her to reach overhead    Baseline  146, 02/25/18- 145, 03/25/18- 161    Time  5    Period  Weeks    Status  Achieved      PT LONG TERM GOAL #3   Title  Pt will demonstrate 160 degrees of right shoulder abduction to allow her to reach out to sides    Baseline  125, 02/25/18- 95, 03/25/18- 145    Time  5    Period  Weeks    Status  On-going      PT LONG TERM GOAL #4   Title  Pt will be independent in a home exercise program for continued strengthening and stretching    Time  5    Period  Weeks    Status  On-going      PT LONG TERM GOAL #5   Title  Pt will obtain appropriate compression garments for long term management of lymphedema.     Baseline  02/25/18- pt obtained a compression bra and wears it all the time    Time  5    Period  Weeks    Status  Achieved            Plan - 04/02/18 0901    Clinical Impression Statement  Continued with focus to Rt shoulder ROM progression with AA/ and P/ROM stretching instructing pt throughout session how to incoporate AA/ROM stretching into ADLs. Briefly during session also included importance correct body mechanics with ADLs to prevent further injury to her back. Also reviewed self manual lymph drainage with pt while performing then having her return demonstration. Overall she is doing this well requiring minor tactile and VCs for correct pressure.     Rehab Potential  Good    Clinical Impairments Affecting Rehab Potential  hx of radiation    PT Frequency  1x / week    PT Duration  4 weeks    PT Treatment/Interventions  ADLs/Self Care Home  Management;Therapeutic exercise;Patient/family education;Orthotic Fit/Training;Compression bandaging;Manual  lymph drainage;Manual techniques;Passive range of motion;Taping;Scar mobilization;Vasopneumatic Device    PT Next Visit Plan   AA/A/PROM to right shoulder with focus on abduction, Cont MLD to R breast having pt perform and review handout, also instruct husband if he comes, review supine dowel exercises.    Consulted and Agree with Plan of Care  Patient       Patient will benefit from skilled therapeutic intervention in order to improve the following deficits and impairments:  Increased fascial restricitons, Pain, Decreased scar mobility, Impaired UE functional use, Decreased strength, Decreased range of motion, Increased edema  Visit Diagnosis: Stiffness of right shoulder, not elsewhere classified  Acute pain of right shoulder  Lymphedema, not elsewhere classified     Problem List Patient Active Problem List   Diagnosis Date Noted  . Use of anastrozole (Arimidex) 01/03/2018  . Localized maculopapular rash 06/16/2017  . Malignant neoplasm of upper-outer quadrant of right breast in female, estrogen receptor positive ([place]) 04/26/2017  . Low back pain 01/29/2017  . Atypical ductal hyperplasia of right breast 01/15/2017  . Allergic rhinitis 04/12/2015  . Diabetes mellitus type 2, controlled ([place]) 07/24/2013  . Hyperlipidemia 07/24/2013  . Essential hypertension, benign 07/24/2013  . Status post dilation of esophageal narrowing 07/24/2013  . Obesity, unspecified 07/24/2013    Otelia Limes, PTA 04/02/2018, 9:34 AM  [place], [place], [address] Phone: [phone]   Fax:  [phone]  Name: JEARLENE BRIDWELL MRN: [id number] Date of Birth: 1951-12-05

## 2018-04-03 ENCOUNTER — Ambulatory Visit (INDEPENDENT_AMBULATORY_CARE_PROVIDER_SITE_OTHER): Payer: Medicare Other | Admitting: *Deleted

## 2018-04-03 DIAGNOSIS — E538 Deficiency of other specified B group vitamins: Secondary | ICD-10-CM

## 2018-04-03 MED ORDER — CYANOCOBALAMIN 1000 MCG/ML IJ SOLN
1000.0000 ug | Freq: Once | INTRAMUSCULAR | Status: AC
  Administered 2018-04-03: 1000 ug via INTRAMUSCULAR

## 2018-04-03 NOTE — Progress Notes (Signed)
Patient presented for B 12 injection to left deltoid, patient voiced no concerns nor showed any signs of distress during injection. 

## 2018-04-08 ENCOUNTER — Encounter: Payer: Self-pay | Admitting: Physical Therapy

## 2018-04-08 ENCOUNTER — Ambulatory Visit: Payer: Medicare Other | Admitting: Physical Therapy

## 2018-04-08 DIAGNOSIS — M25511 Pain in right shoulder: Secondary | ICD-10-CM

## 2018-04-08 DIAGNOSIS — I890 Lymphedema, not elsewhere classified: Secondary | ICD-10-CM

## 2018-04-08 DIAGNOSIS — M25611 Stiffness of right shoulder, not elsewhere classified: Secondary | ICD-10-CM | POA: Diagnosis not present

## 2018-04-08 DIAGNOSIS — M6281 Muscle weakness (generalized): Secondary | ICD-10-CM | POA: Diagnosis not present

## 2018-04-08 NOTE — Therapy (Signed)
[place], [place], [address] Phone: [phone]   Fax:  [phone]  Physical Therapy Treatment  Patient Details  Name: Martha [name] MRN: [id number] Date of Birth: 1951-07-21 Referring Provider: Dr. Lindi Adie   Encounter Date: 04/08/2018  PT End of Session - 04/08/18 1518    Visit Number  10    Number of Visits  15    Date for PT Re-Evaluation  04/22/18    PT Start Time  8786    PT Stop Time  1518    PT Time Calculation (min)  44 min    Activity Tolerance  Patient tolerated treatment well    Behavior During Therapy  [facility] for tasks assessed/performed       Past Medical History:  Diagnosis Date  . Arthritis   . 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 Cancer ([place]) 04/2017   right breast cancer  . Colon polyp 2008  . Diabetes mellitus without complication ([place])   . Endometrial hyperplasia 2013  . GERD (gastroesophageal reflux disease)    OCC-NO MEDS  . Headache    H/O MIGRAINES  . Hypertension   . Papilloma 11/20/2016   right breast  . Status post dilation of esophageal narrowing 2008    Past Surgical History:  Procedure Laterality Date  . BREAST BIOPSY Right 11/02/2016   ATYPICAL PAPILLARY LESION,  . BREAST EXCISIONAL BIOPSY Right 11/20/2016   AHD  . BREAST LUMPECTOMY Right 11/20/2016   Procedure: BREAST LUMPECTOMY;  Surgeon: [name], MD;  Location: [facility] ORS;  Service: General;  Laterality: Right;  . COLONOSCOPY  2008   in [place]  . DILATION AND CURETTAGE OF UTERUS    . RADIOACTIVE SEED GUIDED PARTIAL MASTECTOMY WITH AXILLARY SENTINEL LYMPH NODE BIOPSY Right 05/16/2017   Procedure: RIGHT RADIOACTIVE SEED GUIDED LUMPECTOMY  WITH  RIGHT BREAST SEED GUIDED EXCISIONAL BIOPSY AND RIGHT AXILLARY SENTINEL  NODE BIOPSY;  Surgeon: [name], MD;  Location: [place];  Service: General;  Laterality: Right;  2 SEEDS RIGHT BREAST  GENERAL  AND PEC BLOCK ANESHTESIA    There were no vitals filed for this visit.  Subjective Assessment - 04/08/18 1437    Subjective  My swelling is about the same. It won't go away. The past few nights I have found that my breast is better when I don't wear the bra. It did not look bigger and the zipper did not dig in to me.     Pertinent History  prior history of atypical ductal hyperplasia of the right breast who presented with a mammographic abnormality that led to ultrasound and a stereotactic biopsy on 04/20/2017. 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 HER-2 is pending, lumpectomy 05/16/17 with SLNB, pt completed radiation    Patient Stated Goals  to get my right breast swelling down and get back to exercising    Currently in Pain?  No/denies    Pain Score  0-No pain         [facility] PT Assessment - 04/08/18 0001      AROM   Right Shoulder ABduction  155 Degrees                   [facility] Adult PT Treatment/Exercise - 04/08/18 0001      Manual Therapy   Manual Lymphatic Drainage (MLD)  In Supine: Short neck, superficial and deep abdominals, Rt inguinal and Lt axillary nodes, Rt  axillo-inguinal and Lt axillary anastomosis then focused on Rt breast and areas of fibrosis redirecting towards pathways    Passive ROM  PROM to Rt shoulder in direction of flexion, abduction, ER, while holding scapula in depression because it elevates during PROM                  PT Long Term Goals - 04/08/18 1439      PT LONG TERM GOAL #1   Title  Pt will report a 50% decrease in right breast swelling to decrease risk of infection    Baseline  02/25/18- 15% improvement, 03/25/18- 15% improvement, 04/08/18- 25% improvement    Time  5    Period  Weeks    Status  On-going      PT LONG TERM GOAL #2   Title  Pt will demonstrate 160 degrees of right shoulder flexion to allow her to reach overhead    Baseline  146, 02/25/18- 145, 03/25/18- 161    Time  5     Period  Weeks    Status  Achieved      PT LONG TERM GOAL #3   Title  Pt will demonstrate 160 degrees of right shoulder abduction to allow her to reach out to sides    Baseline  125, 02/25/18- 95, 03/25/18- 145, 04/08/18-155     Time  5    Period  Weeks    Status  On-going      PT LONG TERM GOAL #4   Title  Pt will be independent in a home exercise program for continued strengthening and stretching    Time  5    Period  Weeks    Status  On-going      PT LONG TERM GOAL #5   Title  Pt will obtain appropriate compression garments for long term management of lymphedema.     Baseline  02/25/18- pt obtained a compression bra and wears it all the time    Time  5    Period  Weeks    Status  Achieved            Plan - 04/08/18 1521    Clinical Impression Statement  Assessed pt's progress towards goals in therapy. She has progressed towards her goals and her right shoulder abduction ROM had improved greatly since her last session. She did present with some increased fibrosis in inner right breast but that did soften with treatment today.     Rehab Potential  Good    Clinical Impairments Affecting Rehab Potential  hx of radiation    PT Frequency  1x / week    PT Treatment/Interventions  ADLs/Self Care Home Management;Therapeutic exercise;Patient/family education;Orthotic Fit/Training;Compression bandaging;Manual lymph drainage;Manual techniques;Passive range of motion;Taping;Scar mobilization;Vasopneumatic Device    PT Next Visit Plan   AA/A/PROM to right shoulder with focus on abduction, Cont MLD to R breast having pt perform and review handout, also instruct husband if he comes, review supine dowel exercises.    PT Home Exercise Plan  self MLD    Consulted and Agree with Plan of Care  Patient       Patient will benefit from skilled therapeutic intervention in order to improve the following deficits and impairments:  Increased fascial restricitons, Pain, Decreased scar mobility, Impaired UE  functional use, Decreased strength, Decreased range of motion, Increased edema  Visit Diagnosis: Stiffness of right shoulder, not elsewhere classified  Acute pain of right shoulder  Lymphedema, not elsewhere classified  Problem List Patient Active Problem List   Diagnosis Date Noted  . Use of anastrozole (Arimidex) 01/03/2018  . Localized maculopapular rash 06/16/2017  . Malignant neoplasm of upper-outer quadrant of right breast in female, estrogen receptor positive ([place]) 04/26/2017  . Low back pain 01/29/2017  . Atypical ductal hyperplasia of right breast 01/15/2017  . Allergic rhinitis 04/12/2015  . Diabetes mellitus type 2, controlled ([place]) 07/24/2013  . Hyperlipidemia 07/24/2013  . Essential hypertension, benign 07/24/2013  . Status post dilation of esophageal narrowing 07/24/2013  . Obesity, unspecified 07/24/2013    Allyson Sabal [facility] 04/08/2018, 3:24 PM  [place], [place], [address] Phone: [phone]   Fax:  [phone]  Name: Martha [name] MRN: [id number] Date of Birth: 1951-08-15  Manus Gunning, PT 04/08/18 3:25 PM

## 2018-04-14 IMAGING — MG BREAST SURGICAL SPECIMEN
1 series · 1 of 1 positions shown · non-contrast
Comparison: Previous exam(s).

CLINICAL DATA: Specimen radiograph status post right breast
lumpectomy.

EXAM:
SPECIMEN RADIOGRAPH OF THE RIGHT BREAST

[R]
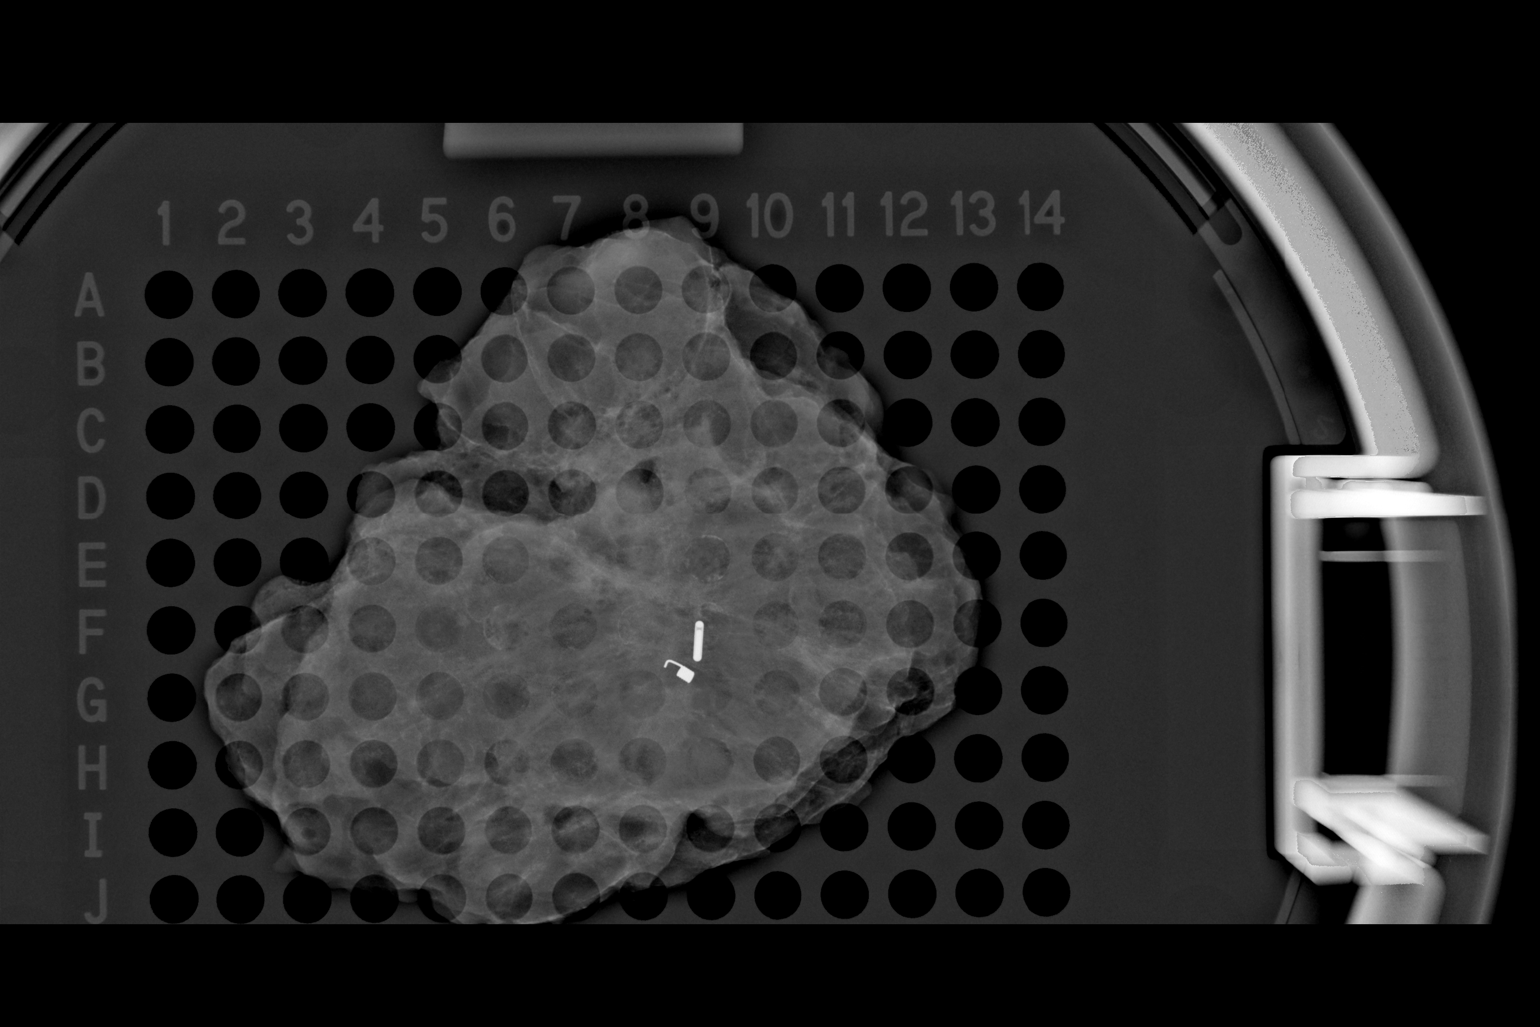

[1 of 1 positions shown; findings below may reference images not displayed]

FINDINGS: Status post excision of the right breast. The radioactive seed and
biopsy marker clip are present, completely intact, and were marked
for pathology. These findings were communicated with the OR at the
time of surgery.
IMPRESSION: Specimen radiograph of the right breast.

## 2018-04-15 ENCOUNTER — Ambulatory Visit: Payer: Medicare Other | Admitting: Physical Therapy

## 2018-04-15 ENCOUNTER — Encounter: Payer: Self-pay | Admitting: Physical Therapy

## 2018-04-15 DIAGNOSIS — M25611 Stiffness of right shoulder, not elsewhere classified: Secondary | ICD-10-CM | POA: Diagnosis not present

## 2018-04-15 DIAGNOSIS — I890 Lymphedema, not elsewhere classified: Secondary | ICD-10-CM | POA: Diagnosis not present

## 2018-04-15 DIAGNOSIS — M6281 Muscle weakness (generalized): Secondary | ICD-10-CM | POA: Diagnosis not present

## 2018-04-15 DIAGNOSIS — M25511 Pain in right shoulder: Secondary | ICD-10-CM | POA: Diagnosis not present

## 2018-04-15 NOTE — Therapy (Signed)
[place], [place], [address] Phone: [phone]   Fax:  [phone]  Physical Therapy Treatment  Patient Details  Name: Martha [name] MRN: [id number] Date of Birth: 1951-02-22 Referring Provider: Dr. Lindi Adie   Encounter Date: 04/15/2018  PT End of Session - 04/15/18 1518    Visit Number  11    Number of Visits  15    Date for PT Re-Evaluation  04/22/18    PT Start Time  0998    PT Stop Time  1517    PT Time Calculation (min)  43 min    Activity Tolerance  Patient tolerated treatment well    Behavior During Therapy  [facility] for tasks assessed/performed       Past Medical History:  Diagnosis Date  . Arthritis   . 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 Cancer ([place]) 04/2017   right breast cancer  . Colon polyp 2008  . Diabetes mellitus without complication ([place])   . Endometrial hyperplasia 2013  . GERD (gastroesophageal reflux disease)    OCC-NO MEDS  . Headache    H/O MIGRAINES  . Hypertension   . Papilloma 11/20/2016   right breast  . Status post dilation of esophageal narrowing 2008    Past Surgical History:  Procedure Laterality Date  . BREAST BIOPSY Right 11/02/2016   ATYPICAL PAPILLARY LESION,  . BREAST EXCISIONAL BIOPSY Right 11/20/2016   AHD  . BREAST LUMPECTOMY Right 11/20/2016   Procedure: BREAST LUMPECTOMY;  Surgeon: [name], MD;  Location: [facility];  Service: General;  Laterality: Right;  . COLONOSCOPY  2008   in [place]  . DILATION AND CURETTAGE OF UTERUS    . RADIOACTIVE SEED GUIDED PARTIAL MASTECTOMY WITH AXILLARY SENTINEL LYMPH NODE BIOPSY Right 05/16/2017   Procedure: RIGHT RADIOACTIVE SEED GUIDED LUMPECTOMY  WITH  RIGHT BREAST SEED GUIDED EXCISIONAL BIOPSY AND RIGHT AXILLARY SENTINEL  NODE BIOPSY;  Surgeon: [name], MD;  Location: [place];  Service: General;  Laterality: Right;  2 SEEDS RIGHT BREAST  GENERAL  AND PEC BLOCK ANESHTESIA    There were no vitals filed for this visit.  Subjective Assessment - 04/15/18 1436    Subjective  For 2 or 3 days I could hardly move my arm it was so sore. I think we did too much last session. I know I have arthritis in my joints. I think my swelling is pretty good. I have been sleeping in the bra. Last night I slept in the binder and that really works.     Pertinent History  prior history of atypical ductal hyperplasia of the right breast who presented with a mammographic abnormality that led to ultrasound and a stereotactic biopsy on 04/20/2017. 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 HER-2 is pending, lumpectomy 05/16/17 with SLNB, pt completed radiation    Patient Stated Goals  to get my right breast swelling down and get back to exercising    Currently in Pain?  No/denies    Pain Score  0-No pain                       [facility] Adult PT Treatment/Exercise - 04/15/18 0001      Manual Therapy   Manual Lymphatic Drainage (MLD)  In Supine: Short neck, superficial and deep abdominals, Rt inguinal and Lt axillary nodes, Rt axillo-inguinal and Lt axillary anastomosis then focused on Rt breast  and areas of fibrosis redirecting towards pathways                  PT Long Term Goals - 04/15/18 1438      PT LONG TERM GOAL #1   Title  Pt will report a 50% decrease in right breast swelling to decrease risk of infection    Baseline  02/25/18- 15% improvement, 03/25/18- 15% improvement, 04/08/18- 25% improvement, 04/15/18- 35%    Time  5    Period  Weeks    Status  Partially Met      PT LONG TERM GOAL #2   Title  Pt will demonstrate 160 degrees of right shoulder flexion to allow her to reach overhead    Baseline  146, 02/25/18- 145, 03/25/18- 161    Time  5    Period  Weeks    Status  Achieved      PT LONG TERM GOAL #3   Title  Pt will demonstrate 160 degrees of right shoulder abduction to allow her to reach  out to sides    Baseline  125, 02/25/18- 95, 03/25/18- 145, 04/08/18-155, 04/15/18- 160    Time  5    Period  Weeks    Status  Achieved      PT LONG TERM GOAL #4   Title  Pt will be independent in a home exercise program for continued strengthening and stretching    Time  5    Period  Weeks    Status  Achieved      PT LONG TERM GOAL #5   Title  Pt will obtain appropriate compression garments for long term management of lymphedema.     Baseline  02/25/18- pt obtained a compression bra and wears it all the time    Time  5    Period  Weeks    Status  Achieved            Plan - 04/15/18 1518    Clinical Impression Statement  Pt has now met all goals for therapy and will be discharged at this time. She has met her right shoulder abduction ROM goal and is able to independently manage her right breast lymphedema through self MLD and compression bras. She continues to report decreasing R breast edema so she partially met this goal.     Rehab Potential  Good    Clinical Impairments Affecting Rehab Potential  hx of radiation    PT Frequency  1x / week    PT Duration  4 weeks    PT Treatment/Interventions  ADLs/Self Care Home Management;Therapeutic exercise;Patient/family education;Orthotic Fit/Training;Compression bandaging;Manual lymph drainage;Manual techniques;Passive range of motion;Taping;Scar mobilization;Vasopneumatic Device    PT Next Visit Plan  dc this visit    PT Home Exercise Plan  self MLD    Consulted and Agree with Plan of Care  Patient       Patient will benefit from skilled therapeutic intervention in order to improve the following deficits and impairments:  Increased fascial restricitons, Pain, Decreased scar mobility, Impaired UE functional use, Decreased strength, Decreased range of motion, Increased edema  Visit Diagnosis: Lymphedema, not elsewhere classified     Problem List Patient Active Problem List   Diagnosis Date Noted  . Use of anastrozole (Arimidex)  01/03/2018  . Localized maculopapular rash 06/16/2017  . Malignant neoplasm of upper-outer quadrant of right breast in female, estrogen receptor positive ([place]) 04/26/2017  . Low back pain 01/29/2017  . Atypical ductal hyperplasia of right breast  01/15/2017  . Allergic rhinitis 04/12/2015  . Diabetes mellitus type 2, controlled ([place]) 07/24/2013  . Hyperlipidemia 07/24/2013  . Essential hypertension, benign 07/24/2013  . Status post dilation of esophageal narrowing 07/24/2013  . Obesity, unspecified 07/24/2013    Allyson Sabal [facility] 04/15/2018, 3:20 PM  [place], [place], [address] Phone: [phone]   Fax:  [phone]  Name: Martha [name] MRN: [id number] Date of Birth: 12-17-1951  Manus Gunning, PT 04/15/18 3:20 PM  PHYSICAL THERAPY DISCHARGE SUMMARY  Visits from Start of Care: 11  Current functional level related to goals / functional outcomes: See above   Remaining deficits: Still some right breast edema but fibrosis is noticeably improved. Pt able to manage lymphedema independently at this point   Education / Equipment: HEP, self MLD, compression bra  Plan: Patient agrees to discharge.  Patient goals were met. Patient is being discharged due to meeting the stated rehab goals.  ?????    Allyson Sabal [place], [place] 04/15/18 3:21 PM

## 2018-05-02 ENCOUNTER — Encounter: Payer: Self-pay | Admitting: Internal Medicine

## 2018-05-06 ENCOUNTER — Telehealth: Payer: Self-pay | Admitting: Radiology

## 2018-05-06 DIAGNOSIS — E1121 Type 2 diabetes mellitus with diabetic nephropathy: Secondary | ICD-10-CM

## 2018-05-06 DIAGNOSIS — I10 Essential (primary) hypertension: Secondary | ICD-10-CM

## 2018-05-06 DIAGNOSIS — E538 Deficiency of other specified B group vitamins: Secondary | ICD-10-CM

## 2018-05-06 DIAGNOSIS — E559 Vitamin D deficiency, unspecified: Secondary | ICD-10-CM

## 2018-05-06 NOTE — Addendum Note (Signed)
Addended by: [name] on: 05/06/2018 01:11 PM   Modules accepted: Orders

## 2018-05-06 NOTE — Telephone Encounter (Signed)
Pt coming in for labs tomorrow, please place future orders. Thank you.  

## 2018-05-07 ENCOUNTER — Other Ambulatory Visit (INDEPENDENT_AMBULATORY_CARE_PROVIDER_SITE_OTHER): Payer: Medicare Other

## 2018-05-07 DIAGNOSIS — E538 Deficiency of other specified B group vitamins: Secondary | ICD-10-CM

## 2018-05-07 DIAGNOSIS — I10 Essential (primary) hypertension: Secondary | ICD-10-CM

## 2018-05-07 DIAGNOSIS — E559 Vitamin D deficiency, unspecified: Secondary | ICD-10-CM | POA: Diagnosis not present

## 2018-05-07 DIAGNOSIS — E1121 Type 2 diabetes mellitus with diabetic nephropathy: Secondary | ICD-10-CM

## 2018-05-07 LAB — COMPREHENSIVE METABOLIC PANEL
ALT: 10 U/L (ref 0–35)
AST: 14 U/L (ref 0–37)
Albumin: 4 g/dL (ref 3.5–5.2)
Alkaline Phosphatase: 98 U/L (ref 39–117)
BUN: 21 mg/dL (ref 6–23)
CO2: 30 mEq/L (ref 19–32)
Calcium: 9.6 mg/dL (ref 8.4–10.5)
Chloride: 103 mEq/L (ref 96–112)
Creatinine, Ser: 0.99 mg/dL (ref 0.40–1.20)
GFR: 71.75 mL/min (ref >60.00)
Glucose, Bld: 111 mg/dL — ABNORMAL HIGH (ref 70–99)
Potassium: 4.2 mEq/L (ref 3.5–5.1)
Sodium: 139 mEq/L (ref 135–145)
Total Bilirubin: 0.3 mg/dL (ref 0.2–1.2)
Total Protein: 7.2 g/dL (ref 6.0–8.3)

## 2018-05-07 LAB — VITAMIN B12: Vitamin B-12: 522 pg/mL (ref 211–911)

## 2018-05-07 LAB — VITAMIN D 25 HYDROXY (VIT D DEFICIENCY, FRACTURES): VITD: 30.29 ng/mL (ref 30.00–100.00)

## 2018-05-07 LAB — HEMOGLOBIN A1C: Hgb A1c MFr Bld: 6.4 % (ref 4.6–6.5)

## 2018-05-08 ENCOUNTER — Ambulatory Visit (INDEPENDENT_AMBULATORY_CARE_PROVIDER_SITE_OTHER): Payer: Medicare Other

## 2018-05-08 ENCOUNTER — Ambulatory Visit (INDEPENDENT_AMBULATORY_CARE_PROVIDER_SITE_OTHER): Payer: Medicare Other | Admitting: Internal Medicine

## 2018-05-08 ENCOUNTER — Encounter: Payer: Self-pay | Admitting: Internal Medicine

## 2018-05-08 VITALS — BP 120/66 | HR 68 | Temp 98.0°F | Resp 15 | Ht 62.5 in | Wt 203.2 lb

## 2018-05-08 DIAGNOSIS — I10 Essential (primary) hypertension: Secondary | ICD-10-CM | POA: Diagnosis not present

## 2018-05-08 DIAGNOSIS — E7800 Pure hypercholesterolemia, unspecified: Secondary | ICD-10-CM

## 2018-05-08 DIAGNOSIS — M79645 Pain in left finger(s): Secondary | ICD-10-CM

## 2018-05-08 DIAGNOSIS — E1121 Type 2 diabetes mellitus with diabetic nephropathy: Secondary | ICD-10-CM | POA: Diagnosis not present

## 2018-05-08 DIAGNOSIS — G8929 Other chronic pain: Secondary | ICD-10-CM | POA: Diagnosis not present

## 2018-05-08 DIAGNOSIS — E538 Deficiency of other specified B group vitamins: Secondary | ICD-10-CM | POA: Diagnosis not present

## 2018-05-08 MED ORDER — PREDNISONE 10 MG PO TABS
ORAL_TABLET | ORAL | 0 refills | Status: DC
Start: 2018-05-08 — End: 2018-10-28

## 2018-05-08 MED ORDER — CYANOCOBALAMIN 1000 MCG/ML IJ SOLN
1000.0000 ug | Freq: Once | INTRAMUSCULAR | Status: AC
  Administered 2018-05-08: 1000 ug via INTRAMUSCULAR

## 2018-05-08 NOTE — Progress Notes (Signed)
diagosed may 2018   Subjective:  Patient ID: Martha [name], female    DOB: Dec 12, 1951  Age: 67 y.o. MRN: [id number]  CC: The primary encounter diagnosis was B12 deficiency. Diagnoses of Thumb pain, left, Chronic pain of left thumb, Essential hypertension, benign, Controlled type 2 diabetes mellitus with diabetic nephropathy, without long-term current use of insulin ([place]), and Pure hypercholesterolemia were also pertinent to this visit.  HPI Martha [name] presents for 4 month follow up on diabetes. .  Patient is following a low glycemic index diet and taking all prescribed medications regularly without side effects.  Fasting sugars have been under less than 140 most of the time and post prandials have been under 160 except on rare occasions. Patient is exercising about 3 times per week and intentionally trying to lose weight .  Patient has had an eye exam in the last 12 months and checks feet regularly for signs of infection.  Patient does not walk barefoot outside,  And denies an numbness tingling or burning in feet. Patient is up to date on all recommended vaccinations  Cc:  Patient has a complaint of left  thumb pain for the past 3 weeks.  The pain is severe and brought on by hyperextension of the thumb.  It also has been popping with flexion.  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  The process takes 3 hours on certain clients and preceded the development of the pain .   History of Breast cancer right  diagnosed may 2018 /DCIS ER positive  sp lumpectomy,  XRT now on anastrozole since Oct 2018.  DEXA was normal in 2016.  Discussed repeating it in Oct 2019  Brother has been diagnosed with bone cancer at age 28,  But she is  not sure if metastatic from prostate or a primary     Outpatient Medications Prior to Visit  Medication Sig Dispense Refill  . anastrozole (ARIMIDEX) 1 MG tablet Take 1 tablet (1 mg total)  by mouth daily. 90 tablet 3  . fluticasone (FLONASE) 50 MCG/ACT nasal spray Place 2 sprays into both nostrils daily. 16 g 2  . lisinopril-hydrochlorothiazide (PRINZIDE,ZESTORETIC) 20-12.5 MG tablet Take 1 tablet by mouth daily. 90 tablet 1  . metFORMIN (GLUCOPHAGE) 500 MG tablet TAKE 1 TABLET BY MOUTH TWO  TIMES DAILY WITH MEALS 180 tablet 3  . non-metallic deodorant (ALRA) MISC Apply 1 application topically.    . simvastatin (ZOCOR) 40 MG tablet TAKE 1 TABLET BY MOUTH  DAILY 90 tablet 3  . triamcinolone cream (KENALOG) 0.1 % Apply topically 2 (two) times daily. For itchy rash 30 g 1  . aspirin 81 MG tablet Take 81 mg by mouth daily.    . cyclobenzaprine (FLEXERIL) 10 MG tablet Take 1 tablet (10 mg total) by mouth 3 (three) times daily as needed for muscle spasms. (Patient not taking: Reported on 05/08/2018) 90 tablet 1  . escitalopram (LEXAPRO) 5 MG tablet TAKE 1 TABLET BY MOUTH EVERY DAY (Patient not taking: Reported on 05/08/2018) 90 tablet 1  . oseltamivir (TAMIFLU) 75 MG capsule Take 1 capsule (75 mg total) by mouth daily. (Patient not taking: Reported on 05/08/2018) 10 capsule 0  . Vitamin D, Ergocalciferol, (DRISDOL) 50000 units CAPS capsule TAKE ONE CAPSULE BY MOUTH ONE TIME PER WEEK (Patient not taking: Reported on 05/08/2018) 4 capsule 2   No facility-administered medications prior to visit.  Review of Systems;  Patient denies headache, fevers, malaise, unintentional weight loss, skin rash, eye pain, sinus congestion and sinus pain, sore throat, dysphagia,  hemoptysis , cough, dyspnea, wheezing, chest pain, palpitations, orthopnea, edema, abdominal pain, nausea, melena, diarrhea, constipation, flank pain, dysuria, hematuria, urinary  Frequency, nocturia, numbness, tingling, seizures,  Focal weakness, Loss of consciousness,  Tremor, insomnia, depression, anxiety, and suicidal ideation.      Objective:  BP 120/66 (BP Location: Left Arm, Patient Position: Sitting, Cuff Size: Large)    Pulse 68   Temp 98 F (36.7 C) (Oral)   Resp 15   Ht 5' 2.5" (1.588 m)   Wt 203 lb 3.2 oz (92.2 kg)   SpO2 97%   BMI 36.57 kg/m   BP Readings from Last 3 Encounters:  05/08/18 120/66  01/24/18 (!) 111/49  01/01/18 (!) 146/72    Wt Readings from Last 3 Encounters:  05/08/18 203 lb 3.2 oz (92.2 kg)  01/24/18 202 lb 3.2 oz (91.7 kg)  01/01/18 203 lb 12.8 oz (92.4 kg)    General appearance: alert, cooperative and appears stated age Ears: normal TM's and external ear canals both ears Throat: lips, mucosa, and tongue normal; teeth and gums normal Neck: no adenopathy, no carotid bruit, supple, symmetrical, trachea midline and thyroid not enlarged, symmetric, no tenderness/mass/nodules Back: symmetric, no curvature. ROM normal. No CVA tenderness. Lungs: clear to auscultation bilaterally Heart: regular rate and rhythm, S1, S2 normal, no murmur, click, rub or gallop Abdomen: soft, non-tender; bowel sounds normal; no masses,  no organomegaly Pulses: 2+ and symmetric Skin: Skin color, texture, turgor normal. No rashes or lesions Lymph nodes: Cervical, supraclavicular, and axillary nodes normal.  Lab Results  Component Value Date   HGBA1C 6.4 05/07/2018   HGBA1C 6.5 01/01/2018   HGBA1C 6.4 09/14/2017    Lab Results  Component Value Date   CREATININE 0.99 05/07/2018   CREATININE 0.95 01/01/2018   CREATININE 1.05 09/14/2017    Lab Results  Component Value Date   WBC 8.8 07/20/2016   HGB 12.1 07/20/2016   HCT 36.9 07/20/2016   PLT 323.0 07/20/2016   GLUCOSE 111 (H) 05/07/2018   CHOL 160 09/14/2017   TRIG 86.0 09/14/2017   HDL 59.70 09/14/2017   LDLDIRECT 71.0 09/14/2017   LDLCALC 83 09/14/2017   ALT 10 05/07/2018   AST 14 05/07/2018   NA 139 05/07/2018   K 4.2 05/07/2018   CL 103 05/07/2018   CREATININE 0.99 05/07/2018   BUN 21 05/07/2018   CO2 30 05/07/2018   TSH 1.20 06/07/2017   HGBA1C 6.4 05/07/2018   MICROALBUR 1.8 01/01/2018    No results  found.  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  Probably has some tendonitis as well.   Ice,  Activity modifciation,tylenol nad prednisone taper  prescribed       Relevant Orders   DG Finger Thumb Left (Completed)   Hyperlipidemia    .LDL and triglycerides are at goal on current medications. SHE has no side effects and liver enzymes are normal. No changes today   Lab Results  Component Value Date   CHOL 160 09/14/2017   HDL 59.70 09/14/2017   LDLCALC 83 09/14/2017   LDLDIRECT 71.0 09/14/2017   TRIG 86.0 09/14/2017   CHOLHDL 3 09/14/2017   Lab Results  Component Value Date   ALT 10 05/07/2018  AST 14 05/07/2018   ALKPHOS 98 05/07/2018   BILITOT 0.3 05/07/2018         Essential hypertension, benign    Well controlled on current regimen. Renal function stable, no changes today.  Lab Results  Component Value Date   CREATININE 0.99 05/07/2018   Lab Results  Component Value Date   NA 139 05/07/2018   K 4.2 05/07/2018   CL 103 05/07/2018   CO2 30 05/07/2018         Diabetes mellitus type 2, controlled ([place])    Historically well-controlled on current medications.  hemoglobin A1c has been consistently at or  less than 7.0 . Patient is up-to-date on eye exams and foot exam is normal today. Patient  Has NO  Urinary microalbuminuria   Patient is tolerating statin therapy for CAD risk reduction and on ACE Inhibitor  for reduction in proteinuria.   Lab Results  Component Value Date   HGBA1C 6.4 05/07/2018   Lab Results  Component Value Date   MICROALBUR 1.8 01/01/2018          Other Visit Diagnoses    B12 deficiency    -  Primary   Relevant Medications   cyanocobalamin ((VITAMIN B-12)) injection 1,000 mcg (Completed)     A total of 25 minutes of face to face time was spent with patient more than half of which was spent in  counselling about the above mentioned conditions  and coordination of care  I have discontinued Moesha M. Mcmartin's Vitamin D (Ergocalciferol), oseltamivir, and escitalopram. I am also having her start on predniSONE. Additionally, I am having her maintain her aspirin, fluticasone, triamcinolone cream, non-metallic deodorant, metFORMIN, simvastatin, lisinopril-hydrochlorothiazide, anastrozole, and cyclobenzaprine. We administered cyanocobalamin.  Meds ordered this encounter  Medications  . cyanocobalamin ((VITAMIN B-12)) injection 1,000 mcg  . predniSONE (DELTASONE) 10 MG tablet    Sig: 6 tablets on Day 1 , then reduce by 1 tablet daily until gone    Dispense:  21 tablet    Refill:  0    Medications Discontinued During This Encounter  Medication Reason  . escitalopram (LEXAPRO) 5 MG tablet Patient has not taken in last 30 days  . oseltamivir (TAMIFLU) 75 MG capsule Completed Course  . Vitamin D, Ergocalciferol, (DRISDOL) 50000 units CAPS capsule Completed Course    Follow-up: Return in about 6 months (around 11/08/2018) for follow up diabetes.   [name], MD

## 2018-05-08 NOTE — Patient Instructions (Addendum)
2000 Ius of Vitamin D3 daily    prednisone taper to improve your thumb pain. Please add tylenol  Up to 2000 mg daily in divided doses    x rays  To look  at joint space

## 2018-05-11 DIAGNOSIS — M79645 Pain in left finger(s): Secondary | ICD-10-CM | POA: Insufficient documentation

## 2018-05-11 NOTE — Assessment & Plan Note (Signed)
Historically well-controlled on current medications.  hemoglobin A1c has been consistently at or  less than 7.0 . Patient is up-to-date on eye exams and foot exam is normal today. Patient  Has NO  Urinary microalbuminuria   Patient is tolerating statin therapy for CAD risk reduction and on ACE Inhibitor  for reduction in proteinuria.   Lab Results  Component Value Date   HGBA1C 6.4 05/07/2018   Lab Results  Component Value Date   MICROALBUR 1.8 01/01/2018

## 2018-05-11 NOTE — Assessment & Plan Note (Addendum)
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  Probably has some tendonitis as well.   Ice,  Activity modifciation,tylenol nad prednisone taper  prescribed

## 2018-05-11 NOTE — Assessment & Plan Note (Signed)
.  LDL and triglycerides are at goal on current medications. SHE has no side effects and liver enzymes are normal. No changes today   Lab Results  Component Value Date   CHOL 160 09/14/2017   HDL 59.70 09/14/2017   LDLCALC 83 09/14/2017   LDLDIRECT 71.0 09/14/2017   TRIG 86.0 09/14/2017   CHOLHDL 3 09/14/2017   Lab Results  Component Value Date   ALT 10 05/07/2018   AST 14 05/07/2018   ALKPHOS 98 05/07/2018   BILITOT 0.3 05/07/2018

## 2018-05-11 NOTE — Assessment & Plan Note (Signed)
Well controlled on current regimen. Renal function stable, no changes today.  Lab Results  Component Value Date   CREATININE 0.99 05/07/2018   Lab Results  Component Value Date   NA 139 05/07/2018   K 4.2 05/07/2018   CL 103 05/07/2018   CO2 30 05/07/2018

## 2018-05-29 ENCOUNTER — Encounter: Payer: Self-pay | Admitting: Internal Medicine

## 2018-08-08 ENCOUNTER — Telehealth: Payer: Self-pay | Admitting: Internal Medicine

## 2018-08-08 NOTE — Telephone Encounter (Signed)
Patient called [facility] on 08/07/18 to discuss need to come into office for DM follow up called patient and scheduled per PCP last OV note for 10/28/18.

## 2018-08-09 DIAGNOSIS — E1121 Type 2 diabetes mellitus with diabetic nephropathy: Secondary | ICD-10-CM

## 2018-08-09 DIAGNOSIS — E7800 Pure hypercholesterolemia, unspecified: Secondary | ICD-10-CM

## 2018-08-27 ENCOUNTER — Other Ambulatory Visit: Payer: Self-pay | Admitting: Internal Medicine

## 2018-08-27 DIAGNOSIS — C50411 Malignant neoplasm of upper-outer quadrant of right female breast: Secondary | ICD-10-CM | POA: Diagnosis not present

## 2018-09-02 ENCOUNTER — Other Ambulatory Visit: Payer: Self-pay | Admitting: General Surgery

## 2018-09-02 DIAGNOSIS — C50411 Malignant neoplasm of upper-outer quadrant of right female breast: Secondary | ICD-10-CM

## 2018-09-11 ENCOUNTER — Ambulatory Visit
Admission: RE | Admit: 2018-09-11 | Discharge: 2018-09-11 | Disposition: A | Discharge status: Home / Self Care | Payer: Medicare Other | Source: Ambulatory Visit | Attending: General Surgery | Admitting: General Surgery

## 2018-09-11 DIAGNOSIS — C50411 Malignant neoplasm of upper-outer quadrant of right female breast: Secondary | ICD-10-CM

## 2018-09-11 DIAGNOSIS — Z853 Personal history of malignant neoplasm of breast: Secondary | ICD-10-CM | POA: Diagnosis not present

## 2018-09-11 DIAGNOSIS — R928 Other abnormal and inconclusive findings on diagnostic imaging of breast: Secondary | ICD-10-CM | POA: Diagnosis not present

## 2018-10-03 ENCOUNTER — Ambulatory Visit (INDEPENDENT_AMBULATORY_CARE_PROVIDER_SITE_OTHER): Payer: Medicare Other

## 2018-10-03 VITALS — BP 126/72 | HR 72 | Temp 98.3°F | Resp 15 | Ht 62.5 in | Wt 205.8 lb

## 2018-10-03 DIAGNOSIS — Z23 Encounter for immunization: Secondary | ICD-10-CM

## 2018-10-03 DIAGNOSIS — Z0000 Encounter for general adult medical examination without abnormal findings: Secondary | ICD-10-CM | POA: Diagnosis not present

## 2018-10-03 NOTE — Progress Notes (Addendum)
Subjective:   Martha [name] is a 67 y.o. female who presents for Medicare Annual (Subsequent) preventive examination.  Review of Systems:  No ROS.  Medicare Wellness Visit. Additional risk factors are reflected in the social history. Cardiac Risk Factors include: advanced age (>29men, >60 women);hypertension;diabetes mellitus;obesity (BMI >30kg/m2)     Objective:     Vitals: BP 126/72 (BP Location: Left Arm, Patient Position: Sitting, Cuff Size: Normal)   Pulse 72   Temp 98.3 F (36.8 C) (Oral)   Resp 15   Ht 5' 2.5" (1.588 m)   Wt 205 lb 12.8 oz (93.4 kg)   SpO2 97%   BMI 37.04 kg/m   Body mass index is 37.04 kg/m.  Advanced Directives 10/03/2018 02/06/2018 10/02/2017 08/21/2017 05/31/2017 05/29/2017 05/16/2017  Does Patient Have a Medical Advance Directive? No No No No No No No  Does patient want to make changes to medical advance directive? - - Yes (MAU/Ambulatory/Procedural Areas - Information given) - - - -  Would patient like information on creating a medical advance directive? No - Patient declined No - Patient declined - Yes (ED - Information included in AVS) No - Patient declined - Yes (MAU/Ambulatory/Procedural Areas - Information given)    Tobacco Social History   Tobacco Use  Smoking Status Former Smoker  . Packs/day: 0.25  . Years: 5.00  . Pack years: 1.25  . Types: Cigarettes  . Last attempt to quit: 07/24/1990  . Years since quitting: 28.2  Smokeless Tobacco Never Used     Counseling given: Not Answered   Clinical Intake:  Pre-visit preparation completed: Yes  Pain : 0-10     Nutritional Status: BMI > 30  Obese Diabetes: Yes  How often do you need to have someone help you when you read instructions, pamphlets, or other written materials from your doctor or pharmacy?: 1 - Never  Interpreter Needed?: No     Past Medical History:  Diagnosis Date  . Arthritis   . 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 Cancer ([place]) 04/2017     right breast cancer  . Colon polyp 2008  . Diabetes mellitus without complication ([place])   . Endometrial hyperplasia 2013  . GERD (gastroesophageal reflux disease)    OCC-NO MEDS  . Headache    H/O MIGRAINES  . Hypertension   . Papilloma 11/20/2016   right breast  . Status post dilation of esophageal narrowing 2008   Past Surgical History:  Procedure Laterality Date  . BREAST BIOPSY Right 11/02/2016   ATYPICAL PAPILLARY LESION,  . BREAST EXCISIONAL BIOPSY Right 11/20/2016   AHD  . BREAST LUMPECTOMY Right 11/20/2016   Procedure: BREAST LUMPECTOMY;  Surgeon: [name], MD;  Location: [facility] ORS;  Service: General;  Laterality: Right;  . COLONOSCOPY  2008   in [place]  . DILATION AND CURETTAGE OF UTERUS    . RADIOACTIVE SEED GUIDED PARTIAL MASTECTOMY WITH AXILLARY SENTINEL LYMPH NODE BIOPSY Right 05/16/2017   Procedure: RIGHT RADIOACTIVE SEED GUIDED LUMPECTOMY  WITH  RIGHT BREAST SEED GUIDED EXCISIONAL BIOPSY AND RIGHT AXILLARY SENTINEL  NODE BIOPSY;  Surgeon: [name], MD;  Location: [place];  Service: General;  Laterality: Right;  2 SEEDS RIGHT BREAST  GENERAL AND PEC BLOCK ANESHTESIA   Family History  Problem Relation Age of Onset  . Hypertension Mother   . Heart disease Father 34  . Bone cancer Brother   . Hypertension Daughter   . Cancer Maternal Aunt 15  breast and ovary  . Pancreatic cancer Maternal Aunt   . Lymphoma Maternal Aunt    Social History   Socioeconomic History  . Marital status: Married    Spouse name: Not on file  . Number of children: Not on file  . Years of education: Not on file  . Highest education level: Not on file  Occupational History  . Not on file  Social Needs  . Financial resource strain: Not hard at all  . Food insecurity:    Worry: Never true    Inability: Never true  . Transportation needs:    Medical: No    Non-medical: No  Tobacco Use  . Smoking status: Former Smoker    Packs/day:  0.25    Years: 5.00    Pack years: 1.25    Types: Cigarettes    Last attempt to quit: 07/24/1990    Years since quitting: 28.2  . Smokeless tobacco: Never Used  Substance and Sexual Activity  . Alcohol use: Yes    Comment: Socially  . Drug use: No  . Sexual activity: Not Currently  Lifestyle  . Physical activity:    Days per week: 0 days    Minutes per session: Not on file  . Stress: Not at all  Relationships  . Social connections:    Talks on phone: Not on file    Gets together: Not on file    Attends religious service: Not on file    Active member of club or organization: Not on file    Attends meetings of clubs or organizations: Not on file    Relationship status: Not on file  Other Topics Concern  . Not on file  Social History Narrative   Lives in [place]. From [place]. Son lives with pt. Dog in home.      Work - [employer], and Theme park manager, now retired.      Diet - regular      Exercise - no regular    Outpatient Encounter Medications as of 10/03/2018  Medication Sig  . anastrozole (ARIMIDEX) 1 MG tablet Take 1 tablet (1 mg total) by mouth daily.  Marland Kitchen aspirin 81 MG tablet Take 81 mg by mouth daily.  . cyclobenzaprine (FLEXERIL) 10 MG tablet Take 1 tablet (10 mg total) by mouth 3 (three) times daily as needed for muscle spasms.  . fluticasone (FLONASE) 50 MCG/ACT nasal spray Place 2 sprays into both nostrils daily.  Marland Kitchen lisinopril-hydrochlorothiazide (PRINZIDE,ZESTORETIC) 20-12.5 MG tablet Take 1 tablet by mouth daily.  . metFORMIN (GLUCOPHAGE) 500 MG tablet TAKE 1 TABLET BY MOUTH TWO  TIMES DAILY WITH MEALS  . non-metallic deodorant (ALRA) MISC Apply 1 application topically.  . predniSONE (DELTASONE) 10 MG tablet 6 tablets on Day 1 , then reduce by 1 tablet daily until gone  . simvastatin (ZOCOR) 40 MG tablet TAKE 1 TABLET BY MOUTH  DAILY  . triamcinolone cream (KENALOG) 0.1 % Apply topically 2 (two) times daily. For itchy rash   No facility-administered encounter  medications on file as of 10/03/2018.     Activities of Daily Living In your present state of health, do you have any difficulty performing the following activities: 10/03/2018  Hearing? N  Vision? N  Difficulty concentrating or making decisions? N  Walking or climbing stairs? N  Dressing or bathing? N  Doing errands, shopping? N  Preparing Food and eating ? N  Using the Toilet? N  In the past six months, have you accidently leaked urine? N  Do  you have problems with loss of bowel control? N  Managing your Medications? N  Managing your Finances? N  Housekeeping or managing your Housekeeping? N  Some recent data might be hidden    Patient Care Team: [name], MD as PCP - General (Internal Medicine) [name], MD (General Surgery) [name], MD as Consulting Physician (Hematology and Oncology) Delice Bison, Charlestine Massed, NP as Nurse Practitioner (Hematology and Oncology) Kyung Rudd, MD as Consulting Physician (Radiation Oncology) [name], MD as Consulting Physician (General Surgery)    Assessment:   This is a routine wellness examination for Angles.  The goal of the wellness visit is to assist the patient how to close the gaps in care and create a preventative care plan for the patient.   States she feels good over all with no acute issues to report to pcp today.  Upcoming appointment with her doctor where she plans to discuss difficulty digesting foods such as chicken and broccoli for about 10 years.   Osteoporosis risk reviewed.    Safety issues reviewed; Smoke and carbon monoxide detectors in the home. Firearm safety discussed. Wears seatbelts when driving or riding with others. No violence in the home.  They do not have excessive sun exposure.  Discussed the need for sun protection: hats, long sleeves and the use of sunscreen if there is significant sun exposure.  No new identified risk were noted.  No failures at ADL's or IADL's.    BMI- discussed the importance of a healthy diet, water intake and the benefits of aerobic exercise. Educational material provided.   24 hour diet recall: Moderate diet  Dental- every 6 months.  Sleep patterns- Sleeps fair.   High dose influenza vaccine administered L deltoid, tolerated well. No verbal complaint during or post administration. Educational material provided.  Pneumovax 23 vaccine discussed.   Exercise Activities and Dietary recommendations Current Exercise Habits: The patient does not participate in regular exercise at present  Goals      Patient Stated   . DIET - INCREASE LEAN PROTEINS (pt-stated)     Low carb diet Premier protein or Ensure Max supplemental drinks to replace a meal or snack    . Increase physical activity (pt-stated)     Get started again with water aerobics, treadmill and stationary bike 4 times per week (when not keeping the grandchildren)       Other   . DIET - INCREASE WATER INTAKE       Fall Risk Fall Risk  10/03/2018 10/02/2017 08/21/2017 05/29/2017 09/20/2016  Falls in the past year? No No No No No   Depression Screen PHQ 2/9 Scores 10/03/2018 10/02/2017 09/20/2016 07/20/2016  PHQ - 2 Score 0 0 0 0  PHQ- 9 Score - 0 - -     Cognitive Function MMSE - Mini Mental State Exam 10/03/2018 10/02/2017 09/20/2016  Orientation to time 5 5 5   Orientation to Place 5 5 5   Registration 3 3 3   Attention/ Calculation 5 5 5   Recall 3 3 3   Language- name 2 objects 2 2 2   Language- repeat 1 1 1   Language- follow 3 step command 3 3 3   Language- read & follow direction 1 1 1   Write a sentence 1 1 1   Copy design 1 1 1   Total score 30 30 30         Immunization History  Administered Date(s) Administered  . Influenza Split 10/15/2014  . Influenza, High Dose Seasonal PF 10/03/2018  . Influenza,inj,Quad  PF,6+ Mos 09/20/2016  . Influenza-Unspecified 10/24/2012, 10/12/2013, 10/01/2017  . Pneumococcal Conjugate-13 06/21/2015, 08/25/2017  .  Pneumococcal Polysaccharide-23 10/24/2012  . Tdap 07/24/2009   Screening Tests Health Maintenance  Topic Date Due  . PNA vac Low Risk Adult (2 of 2 - PPSV23) 08/25/2018  . COLONOSCOPY  07/27/2020 (Originally 07/24/2017)  . HEMOGLOBIN A1C  11/07/2018  . FOOT EXAM  01/01/2019  . OPHTHALMOLOGY EXAM  04/02/2019  . TETANUS/TDAP  07/25/2019  . MAMMOGRAM  09/11/2020  . INFLUENZA VACCINE  Completed  . DEXA SCAN  Completed      Plan:   End of life planning; Advanced aging; Advanced directives discussed.  No HCPOA/Living Will.  Additional information declined at this time.  I have personally reviewed and noted the following in the patient's chart:   . Medical and social history . Use of alcohol, tobacco or illicit drugs  . Current medications and supplements . Functional ability and status . Nutritional status . Physical activity . Advanced directives . List of other physicians . Hospitalizations, surgeries, and ER visits in previous 12 months . Vitals . Screenings to include cognitive, depression, and falls . Referrals and appointments  In addition, I have reviewed and discussed with patient certain preventive protocols, quality metrics, and best practice recommendations. A written personalized care plan for preventive services as well as general preventive health recommendations were provided to patient.     OBrien-Blaney, Deondrea Aguado L, LPN  [id number]    I have reviewed the above information and agree with above.   Deborra Medina, MD

## 2018-10-03 NOTE — Patient Instructions (Addendum)
  [name] , Thank you for taking time to come for your Medicare Wellness Visit. I appreciate your ongoing commitment to your health goals. Please review the following plan we discussed and let me know if I can assist you in the future.   These are the goals we discussed: Goals      Patient Stated   . DIET - INCREASE LEAN PROTEINS (pt-stated)     Low carb diet Premier protein or Ensure Max supplemental drinks to replace a meal or snack    . Increase physical activity (pt-stated)     Get started again with water aerobics, treadmill and stationary bike 4 times per week (when not keeping the grandchildren)       Other   . DIET - INCREASE WATER INTAKE       This is a list of the screening recommended for you and due dates:  Health Maintenance  Topic Date Due  . Pneumonia vaccines (2 of 2 - PPSV23) 08/25/2018  . Colon Cancer Screening  07/27/2020*  . Hemoglobin A1C  11/07/2018  . Complete foot exam   01/01/2019  . Eye exam for diabetics  04/02/2019  . Tetanus Vaccine  07/25/2019  . Mammogram  09/11/2020  . Flu Shot  Completed  . DEXA scan (bone density measurement)  Completed  *Topic was postponed. The date shown is not the original due date.

## 2018-10-15 ENCOUNTER — Other Ambulatory Visit: Payer: Self-pay | Admitting: Internal Medicine

## 2018-10-15 DIAGNOSIS — I10 Essential (primary) hypertension: Secondary | ICD-10-CM

## 2018-10-28 ENCOUNTER — Encounter: Payer: Self-pay | Admitting: Internal Medicine

## 2018-10-28 ENCOUNTER — Ambulatory Visit (INDEPENDENT_AMBULATORY_CARE_PROVIDER_SITE_OTHER): Payer: Medicare Other | Admitting: Internal Medicine

## 2018-10-28 VITALS — BP 128/70 | HR 80 | Temp 98.3°F | Resp 14 | Ht 62.5 in | Wt 206.4 lb

## 2018-10-28 DIAGNOSIS — I10 Essential (primary) hypertension: Secondary | ICD-10-CM | POA: Diagnosis not present

## 2018-10-28 DIAGNOSIS — G8929 Other chronic pain: Secondary | ICD-10-CM

## 2018-10-28 DIAGNOSIS — E559 Vitamin D deficiency, unspecified: Secondary | ICD-10-CM

## 2018-10-28 DIAGNOSIS — E1121 Type 2 diabetes mellitus with diabetic nephropathy: Secondary | ICD-10-CM

## 2018-10-28 DIAGNOSIS — M25532 Pain in left wrist: Secondary | ICD-10-CM

## 2018-10-28 DIAGNOSIS — M79645 Pain in left finger(s): Secondary | ICD-10-CM | POA: Diagnosis not present

## 2018-10-28 LAB — COMPREHENSIVE METABOLIC PANEL
ALT: 8 U/L (ref 0–35)
AST: 13 U/L (ref 0–37)
Albumin: 4.1 g/dL (ref 3.5–5.2)
Alkaline Phosphatase: 102 U/L (ref 39–117)
BUN: 19 mg/dL (ref 6–23)
CO2: 29 mEq/L (ref 19–32)
Calcium: 9.8 mg/dL (ref 8.4–10.5)
Chloride: 103 mEq/L (ref 96–112)
Creatinine, Ser: 1.01 mg/dL (ref 0.40–1.20)
GFR: 70.02 mL/min (ref >60.00)
Glucose, Bld: 116 mg/dL — ABNORMAL HIGH (ref 70–99)
Potassium: 4.1 mEq/L (ref 3.5–5.1)
Sodium: 139 mEq/L (ref 135–145)
Total Bilirubin: 0.3 mg/dL (ref 0.2–1.2)
Total Protein: 7.1 g/dL (ref 6.0–8.3)

## 2018-10-28 LAB — MICROALBUMIN / CREATININE URINE RATIO
Creatinine,U: 156.5 mg/dL
Microalb Creat Ratio: 0.7 mg/g (ref 0.0–30.0)
Microalb, Ur: 1.1 mg/dL (ref 0.0–1.9)

## 2018-10-28 LAB — LIPID PANEL
Cholesterol: 149 mg/dL (ref 0–200)
HDL: 61.2 mg/dL (ref >39.00)
LDL Cholesterol: 69 mg/dL (ref 0–99)
NonHDL: 88.22
Total CHOL/HDL Ratio: 2
Triglycerides: 96 mg/dL (ref 0.0–149.0)
VLDL: 19.2 mg/dL (ref 0.0–40.0)

## 2018-10-28 LAB — HEMOGLOBIN A1C: Hgb A1c MFr Bld: 6.6 % — ABNORMAL HIGH (ref 4.6–6.5)

## 2018-10-28 LAB — VITAMIN D 25 HYDROXY (VIT D DEFICIENCY, FRACTURES): VITD: 33.58 ng/mL (ref 30.00–100.00)

## 2018-10-28 MED ORDER — CELECOXIB 200 MG PO CAPS: 200.0000 mg | ORAL_CAPSULE | Freq: Every day | ORAL | 5 refills | Status: DC

## 2018-10-28 NOTE — Progress Notes (Signed)
Subjective:  Patient ID: Martha [name], female    DOB: May 02, 1951  Age: 67 y.o. MRN: [id number]  CC: The primary encounter diagnosis was Vitamin D deficiency. Diagnoses of Essential hypertension, benign, Controlled type 2 diabetes mellitus with diabetic nephropathy, without long-term current use of insulin ([place]), Wrist pain, chronic, left, and Thumb pain, left were also pertinent to this visit.  HPI Martha [name] presents for 6 month follow up on diabetes.  Patient has multiple painful joints including her left thumb,  Shins are aching at night,  Hips joint hurts if she lies on  It .  Her pain has become  progressively worse.   Taking otc vitamin D  Since last visit.  Taking anastrozole.   Taking 2 tylenol daily    Patient is following a low glycemic index diet and taking all prescribed medications regularly without side effects.  Fasting sugars have been under less than 140 most of the time and post prandials have been under 160 except on rare occasions. Patient is not exercising about 3 times per week and intentionally trying to lose weight .  Patient has had an eye exam in the last 12 months and checks feet regularly for signs of infection.  Patient does not walk barefoot outside,  And denies an numbness tingling or burning in feet. Patient is up to date on all recommended vaccinations  Outpatient Medications Prior to Visit  Medication Sig Dispense Refill  . anastrozole (ARIMIDEX) 1 MG tablet Take 1 tablet (1 mg total) by mouth daily. 90 tablet 3  . aspirin 81 MG tablet Take 81 mg by mouth daily.    . cyclobenzaprine (FLEXERIL) 10 MG tablet Take 1 tablet (10 mg total) by mouth 3 (three) times daily as needed for muscle spasms. 90 tablet 1  . fluticasone (FLONASE) 50 MCG/ACT nasal spray Place 2 sprays into both nostrils daily. 16 g 2  . lisinopril-hydrochlorothiazide (PRINZIDE,ZESTORETIC) 20-12.5 MG tablet TAKE 1 TABLET BY MOUTH  DAILY 90 tablet 1  . metFORMIN (GLUCOPHAGE) 500 MG tablet  TAKE 1 TABLET BY MOUTH TWO  TIMES DAILY WITH MEALS 180 tablet 3  . non-metallic deodorant (ALRA) MISC Apply 1 application topically.    . simvastatin (ZOCOR) 40 MG tablet TAKE 1 TABLET BY MOUTH  DAILY 90 tablet 3  . triamcinolone cream (KENALOG) 0.1 % Apply topically 2 (two) times daily. For itchy rash 30 g 1  . predniSONE (DELTASONE) 10 MG tablet 6 tablets on Day 1 , then reduce by 1 tablet daily until gone (Patient not taking: Reported on 10/28/2018) 21 tablet 0   No facility-administered medications prior to visit.     Review of Systems;  Patient denies headache, fevers, malaise, unintentional weight loss, skin rash, eye pain, sinus congestion and sinus pain, sore throat, dysphagia,  hemoptysis , cough, dyspnea, wheezing, chest pain, palpitations, orthopnea, edema, abdominal pain, nausea, melena, diarrhea, constipation, flank pain, dysuria, hematuria, urinary  Frequency, nocturia, numbness, tingling, seizures,  Focal weakness, Loss of consciousness,  Tremor, insomnia, depression, anxiety, and suicidal ideation.      Objective:  BP 128/70 (BP Location: Left Arm, Patient Position: Sitting, Cuff Size: Normal)   Pulse 80   Temp 98.3 F (36.8 C) (Oral)   Resp 14   Ht 5' 2.5" (1.588 m)   Wt 206 lb 6.4 oz (93.6 kg)   SpO2 95%   BMI 37.15 kg/m   BP Readings from Last 3 Encounters:  10/28/18 128/70  10/03/18 126/72  05/08/18 120/66  Wt Readings from Last 3 Encounters:  10/28/18 206 lb 6.4 oz (93.6 kg)  10/03/18 205 lb 12.8 oz (93.4 kg)  05/08/18 203 lb 3.2 oz (92.2 kg)    General appearance: alert, cooperative and appears stated age Ears: normal TM's and external ear canals both ears Throat: lips, mucosa, and tongue normal; teeth and gums normal Neck: no adenopathy, no carotid bruit, supple, symmetrical, trachea midline and thyroid not enlarged, symmetric, no tenderness/mass/nodules Back: symmetric, no curvature. ROM normal. No CVA tenderness. Lungs: clear to auscultation  bilaterally Heart: regular rate and rhythm, S1, S2 normal, no murmur, click, rub or gallop Abdomen: soft, non-tender; bowel sounds normal; no masses,  no organomegaly Pulses: 2+ and symmetric MSK: no synovitis .  Bilateral negative Phalen's sign.  Skin: Skin color, texture, turgor normal. No rashes or lesions Lymph nodes: Cervical, supraclavicular, and axillary nodes normal.  Lab Results  Component Value Date   HGBA1C 6.6 (H) 10/28/2018   HGBA1C 6.4 05/07/2018   HGBA1C 6.5 01/01/2018    Lab Results  Component Value Date   CREATININE 1.01 10/28/2018   CREATININE 0.99 05/07/2018   CREATININE 0.95 01/01/2018    Lab Results  Component Value Date   WBC 8.8 07/20/2016   HGB 12.1 07/20/2016   HCT 36.9 07/20/2016   PLT 323.0 07/20/2016   GLUCOSE 116 (H) 10/28/2018   CHOL 149 10/28/2018   TRIG 96.0 10/28/2018   HDL 61.20 10/28/2018   LDLDIRECT 71.0 09/14/2017   LDLCALC 69 10/28/2018   ALT 8 10/28/2018   AST 13 10/28/2018   NA 139 10/28/2018   K 4.1 10/28/2018   CL 103 10/28/2018   CREATININE 1.01 10/28/2018   BUN 19 10/28/2018   CO2 29 10/28/2018   TSH 1.20 06/07/2017   HGBA1C 6.6 (H) 10/28/2018   MICROALBUR 1.1 10/28/2018    Mm Diag Breast Tomo Bilateral  Result Date: 09/11/2018 CLINICAL DATA:  Patient presents for bilateral diagnostic examination due to history of right malignant lumpectomy 2018. EXAM: DIGITAL DIAGNOSTIC BILATERAL MAMMOGRAM WITH CAD AND TOMO COMPARISON:  Previous exam(s). ACR Breast Density Category b: There are scattered areas of fibroglandular density. FINDINGS: Examination demonstrates expected post lumpectomy changes with minimal dystrophic calcification at the lumpectomy site in the right upper outer quadrant. Remainder of the exam is unchanged. Mammographic images were processed with CAD. IMPRESSION: Expected post lumpectomy changes over the upper-outer right breast. RECOMMENDATION: Recommend continued annual bilateral diagnostic mammographic  evaluation. I have discussed the findings and recommendations with the patient. Results were also provided in writing at the conclusion of the visit. If applicable, a reminder letter will be sent to the patient regarding the next appointment. BI-RADS CATEGORY  2: Benign. Electronically Signed   By: Marin Olp M.D.   On: 09/11/2018 12:15    Assessment & Plan:   Problem List Items Addressed This Visit    Diabetes mellitus type 2, controlled ([place])    Historically well-controlled on current medications.  hemoglobin A1c has been consistently at or  less than 7.0 . Patient is up-to-date on eye exams and foot exam is normal today. Patient  Has NO  Urinary microalbuminuria   Patient is tolerating statin therapy for CAD risk reduction and on ACE Inhibitor  for reduction in proteinuria.   Lab Results  Component Value Date   HGBA1C 6.6 (H) 10/28/2018   Lab Results  Component Value Date   MICROALBUR 1.1 10/28/2018         Relevant Orders   Lipid panel (Completed)  Hemoglobin A1c (Completed)   Microalbumin / creatinine urine ratio (Completed)   Essential hypertension, benign    Well controlled on current regimen. Renal function stable, no changes today.  Lab Results  Component Value Date   CREATININE 1.01 10/28/2018   Lab Results  Component Value Date   NA 139 10/28/2018   K 4.1 10/28/2018   CL 103 10/28/2018   CO2 29 10/28/2018         Relevant Orders   Comprehensive metabolic panel (Completed)   Thumb pain, left    Orthopedic referral made.  Trial of celebrex       Relevant Orders   Ambulatory referral to Orthopedic Surgery    Other Visit Diagnoses    Vitamin D deficiency    -  Primary   Relevant Orders   VITAMIN D 25 Hydroxy (Vit-D Deficiency, Fractures) (Completed)   Wrist pain, chronic, left       Relevant Medications   celecoxib (CELEBREX) 200 MG capsule   Other Relevant Orders   Ambulatory referral to Orthopedic Surgery     A total of 25 minutes of face to  face time was spent with patient more than half of which was spent in counselling about the above mentioned conditions  and coordination of care   I have discontinued Jesika M. Eagleton's predniSONE. I am also having her start on celecoxib. Additionally, I am having her maintain her aspirin, fluticasone, triamcinolone cream, non-metallic deodorant, anastrozole, cyclobenzaprine, simvastatin, metFORMIN, and lisinopril-hydrochlorothiazide.  Meds ordered this encounter  Medications  . celecoxib (CELEBREX) 200 MG capsule    Sig: Take 1 capsule (200 mg total) by mouth daily.    Dispense:  30 capsule    Refill:  5    Medications Discontinued During This Encounter  Medication Reason  . predniSONE (DELTASONE) 10 MG tablet Completed Course    Follow-up: No follow-ups on file.   [name], MD

## 2018-10-28 NOTE — Patient Instructions (Addendum)
You can use  celebrex once daily  or every other for joint pain   I am making a ORTHOPEDICS REFERRAL FOR THUMB PAIN  AND POSSIBLE STEROID SHOT   You can taken up to 2000 mg of acetominophen (tylenol) every day safely  In divided doses (500 mg every 6 hours  Or 1000 mg every 12 hours.)     Carpal Tunnel Syndrome Carpal tunnel syndrome is a condition that causes pain in your hand and arm. The carpal tunnel is a narrow area that is on the palm side of your wrist. Repeated wrist motion or certain diseases may cause swelling in the tunnel. This swelling can pinch the main nerve in the wrist (median nerve). Follow these instructions at home: If you have a splint:  Wear it as told by your doctor. Remove it only as told by your doctor.  Loosen the splint if your fingers: ? Become numb and tingle. ? Turn blue and cold.  Keep the splint clean and dry. General instructions  Take over-the-counter and prescription medicines only as told by your doctor.  Rest your wrist from any activity that may be causing your pain. If needed, talk to your employer about changes that can be made in your work, such as getting a wrist pad to use while typing.  If directed, apply ice to the painful area: ? Put ice in a plastic bag. ? Place a towel between your skin and the bag. ? Leave the ice on for 20 minutes, 2-3 times per day.  Keep all follow-up visits as told by your doctor. This is important.  Do any exercises as told by your doctor, physical therapist, or occupational therapist. Contact a doctor if:  You have new symptoms.  Medicine does not help your pain.  Your symptoms get worse. This information is not intended to replace advice given to you by your health care provider. Make sure you discuss any questions you have with your health care provider. Document Released: 11/30/2011 Document Revised: 05/18/2016 Document Reviewed: 04/28/2015 Elsevier Interactive Patient Education  [employer].

## 2018-10-29 NOTE — Assessment & Plan Note (Signed)
Orthopedic referral made.  Trial of celebrex

## 2018-10-29 NOTE — Assessment & Plan Note (Signed)
Historically well-controlled on current medications.  hemoglobin A1c has been consistently at or  less than 7.0 . Patient is up-to-date on eye exams and foot exam is normal today. Patient  Has NO  Urinary microalbuminuria   Patient is tolerating statin therapy for CAD risk reduction and on ACE Inhibitor  for reduction in proteinuria.   Lab Results  Component Value Date   HGBA1C 6.6 (H) 10/28/2018   Lab Results  Component Value Date   MICROALBUR 1.1 10/28/2018

## 2018-10-29 NOTE — Assessment & Plan Note (Signed)
Well controlled on current regimen. Renal function stable, no changes today.  Lab Results  Component Value Date   CREATININE 1.01 10/28/2018   Lab Results  Component Value Date   NA 139 10/28/2018   K 4.1 10/28/2018   CL 103 10/28/2018   CO2 29 10/28/2018

## 2018-11-01 ENCOUNTER — Other Ambulatory Visit: Payer: Self-pay | Admitting: Internal Medicine

## 2018-11-01 MED ORDER — MELOXICAM 15 MG PO TABS: 15.0000 mg | ORAL_TABLET | Freq: Every day | ORAL | 0 refills | Status: DC

## 2018-11-20 ENCOUNTER — Other Ambulatory Visit: Payer: Self-pay

## 2018-11-20 ENCOUNTER — Other Ambulatory Visit: Payer: Self-pay | Admitting: Internal Medicine

## 2018-11-20 MED ORDER — MELOXICAM 15 MG PO TABS: 15.0000 mg | ORAL_TABLET | Freq: Every day | ORAL | 1 refills | Status: DC

## 2018-11-22 ENCOUNTER — Other Ambulatory Visit: Payer: Self-pay

## 2018-11-22 DIAGNOSIS — C50411 Malignant neoplasm of upper-outer quadrant of right female breast: Secondary | ICD-10-CM

## 2018-11-22 DIAGNOSIS — Z170 Estrogen receptor positive status [ER+]: Principal | ICD-10-CM

## 2018-11-22 MED ORDER — ANASTROZOLE 1 MG PO TABS: 1.0000 mg | ORAL_TABLET | Freq: Every day | ORAL | 0 refills | Status: DC

## 2018-12-04 ENCOUNTER — Other Ambulatory Visit: Payer: Self-pay | Admitting: Internal Medicine

## 2018-12-05 ENCOUNTER — Other Ambulatory Visit: Payer: Self-pay

## 2018-12-05 NOTE — Patient Outreach (Signed)
[facility]) Care Management  12/05/2018  ELZIE SHEETS 1951/11/14 [id number]   Medication Adherence call to Mrs. Taelyr Jantz left a message for patient to call back patient is due on Lisinopril /HCTZ 20/12.5 mg ,Metformin 500 mg and Simvastatin 40 mg. Mrs. Hamric is showing past due under [place].   [facility] Direct Dial [phone]  Fax [phone] [email or web address]

## 2019-01-09 ENCOUNTER — Telehealth: Payer: Self-pay | Admitting: Internal Medicine

## 2019-01-09 DIAGNOSIS — E1121 Type 2 diabetes mellitus with diabetic nephropathy: Secondary | ICD-10-CM

## 2019-01-09 MED ORDER — GLUCOSE BLOOD VI STRP: ORAL_STRIP | 12 refills | Status: DC

## 2019-01-09 NOTE — Telephone Encounter (Signed)
Spoke with pt and she stated that she is feeling better and thinks she just had a cold that she picked up from her grandchildren. Pt stated that since she is feeling better she does not need an appt at this time.

## 2019-01-09 NOTE — Telephone Encounter (Signed)
sonetouch strips rx ent to local pharmacy

## 2019-01-09 NOTE — Telephone Encounter (Signed)
Called and spoke with patient. Pt advised and voiced understanding.  

## 2019-01-09 NOTE — Telephone Encounter (Signed)
Pt picked up her OneTouch today. She wanted to know if she can get a prescription for the test stripes.

## 2019-01-09 NOTE — Telephone Encounter (Signed)
Sent to PCP ?

## 2019-01-14 ENCOUNTER — Telehealth: Payer: Self-pay | Admitting: Hematology and Oncology

## 2019-01-14 NOTE — Telephone Encounter (Signed)
[place] moved 2/5 f/u to AM. Spoke with patient.

## 2019-01-29 ENCOUNTER — Inpatient Hospital Stay: Payer: Medicare Other | Attending: Hematology and Oncology | Admitting: Hematology and Oncology

## 2019-01-29 ENCOUNTER — Telehealth: Payer: Self-pay | Admitting: Hematology and Oncology

## 2019-01-29 DIAGNOSIS — Z170 Estrogen receptor positive status [ER+]: Secondary | ICD-10-CM | POA: Insufficient documentation

## 2019-01-29 DIAGNOSIS — M791 Myalgia, unspecified site: Secondary | ICD-10-CM | POA: Diagnosis not present

## 2019-01-29 DIAGNOSIS — C50411 Malignant neoplasm of upper-outer quadrant of right female breast: Secondary | ICD-10-CM

## 2019-01-29 DIAGNOSIS — I890 Lymphedema, not elsewhere classified: Secondary | ICD-10-CM | POA: Diagnosis not present

## 2019-01-29 MED ORDER — ANASTROZOLE 1 MG PO TABS: 1.0000 mg | ORAL_TABLET | Freq: Every day | ORAL | 3 refills | Status: DC

## 2019-01-29 NOTE — Assessment & Plan Note (Signed)
Right breast mass 9:00 position 1 cm from nipple: 4 mm; small asymmetry in the upper outer quadrant measuring 1 cm 04/19/2017:Right breast stereotactic biopsy: IDC with DCIS, biopsy 9:00 position: [facility], ER 100%, PR 100% HER-2 Neg 05/21/17: Right lumpectomy: IDC grade 1, microscopic focus, low-grade DCIS, 0/3 lymph nodes negative, Atypical lobular hyperplasia, ER 100%, PR 100%, HER-2 negative ratio 1.63, Ki-67 insufficient, T1mic, N0 stage IA  Adj XRT 06/18/17- 07/17/17 Current Treatment: Anastrozole 1 mg daily Anastrozole toxicities: 1.  Hot flashes 2. Myalgias  Breast cancer surveillance: 1.  Mammogram 09/11/2018: Benign breast density category B 2.  Breast exam 01/29/2019: Benign  Breast lymphedema: Patient massages the breast.    RTC in 1 year 

## 2019-01-29 NOTE — Telephone Encounter (Signed)
Gave avs and calendar ° °

## 2019-01-29 NOTE — Progress Notes (Signed)
Patient Care Team: [name], MD as PCP - General (Internal Medicine) [name], MD (General Surgery) [name], MD as Consulting Physician (Hematology and Oncology) Delice Bison, Charlestine Massed, NP as Nurse Practitioner (Hematology and Oncology) Kyung Rudd, MD as Consulting Physician (Radiation Oncology) [name], MD as Consulting Physician (General Surgery)  DIAGNOSIS:    ICD-10-CM   1. Malignant neoplasm of upper-outer quadrant of right breast in female, estrogen receptor positive ([place]) C50.411 anastrozole (ARIMIDEX) 1 MG tablet   Z17.0     SUMMARY OF ONCOLOGIC HISTORY:   Malignant neoplasm of upper-outer quadrant of right breast in female, estrogen receptor positive ([place])   04/09/2017 Mammogram    Right breast mass 9:00 position 1 cm from nipple: 4 mm; small asymmetry in the upper outer quadrant measuring 1 cm    04/20/2017 Initial Diagnosis    Right breast stereotactic biopsy: IDC with DCIS, biopsy 9:00 position: [facility], ER 100%, PR 100% HER-2 pending    05/16/2017 Surgery    Right lumpectomy: IDC grade 1, microscopic focus, low-grade DCIS, 0/3 lymph nodes negative, Atypical lobular hyperplasia, ER 100%, PR 100%, HER-2 negative ratio 1.63, Ki-67 insufficient, T50mc, N0 stage IA    06/18/2017 - 07/17/2017 Radiation Therapy    Adjuvant radiation therapy    09/2017 -  Anti-estrogen oral therapy    Anastrozole 141mdaily     CHIEF COMPLIANT: Follow-up of anastrozole therapy  INTERVAL HISTORY: Martha [name] a [age].o. with above-mentioned history of right breast cancer treated with lumpectomy and adjuvant radiation who is currently on anastrozole therapy. I last saw the patient one year ago. Her most recent mammogram from 09/11/18 showed no evidence of malignancy. She presents to the clinic alone today and is doing well. She reports occasional sleepless nights and muscle aches and pains, which her PCP thinks is osteoarthritis and for which she is taking  meloxicam. She denies any pain in her breasts but notes occasional swelling in her right breast. She reviewed her medication list with me.   REVIEW OF SYSTEMS:   Constitutional: Denies fevers, chills or abnormal weight loss  Eyes: Denies blurriness of vision Ears, nose, mouth, throat, and face: Denies mucositis or sore throat Respiratory: Denies cough, dyspnea or wheezes Cardiovascular: Denies palpitation, chest discomfort Gastrointestinal: Denies nausea, heartburn or change in bowel habits Skin: Denies abnormal skin rashes MSK: (+) muscle aches and pains Lymphatics: Denies new lymphadenopathy or easy bruising Neurological: Denies numbness, tingling or new weaknesses Behavioral/Psych: Mood is stable, no new changes (+) difficulty sleeping Extremities: No lower extremity edema Breast: denies any pain or lumps or nodules in either breasts (+) occasional swelling in right breast All other systems were reviewed with the patient and are negative.  I have reviewed the past medical history, past surgical history, social history and family history with the patient and they are unchanged from previous note.  ALLERGIES:  has No Known Allergies.  MEDICATIONS:  Current Outpatient Medications  Medication Sig Dispense Refill  . anastrozole (ARIMIDEX) 1 MG tablet Take 1 tablet (1 mg total) by mouth daily. 90 tablet 3  . aspirin 81 MG tablet Take 81 mg by mouth daily.    . cyclobenzaprine (FLEXERIL) 10 MG tablet Take 1 tablet (10 mg total) by mouth 3 (three) times daily as needed for muscle spasms. 90 tablet 1  . fluticasone (FLONASE) 50 MCG/ACT nasal spray Place 2 sprays into both nostrils daily. 16 g 2  . glucose blood test strip Use daily to test blood  sugar E11.9 100 each 12  . lisinopril-hydrochlorothiazide (PRINZIDE,ZESTORETIC) 20-12.5 MG tablet TAKE 1 TABLET BY MOUTH  DAILY 90 tablet 1  . meloxicam (MOBIC) 15 MG tablet Take 1 tablet (15 mg total) by mouth daily. 90 tablet 1  . meloxicam  (MOBIC) 15 MG tablet TAKE 1 TABLET BY MOUTH EVERY DAY 30 tablet 0  . metFORMIN (GLUCOPHAGE) 500 MG tablet TAKE 1 TABLET BY MOUTH TWO  TIMES DAILY WITH MEALS 180 tablet 3  . non-metallic deodorant (ALRA) MISC Apply 1 application topically.    . simvastatin (ZOCOR) 40 MG tablet TAKE 1 TABLET BY MOUTH  DAILY 90 tablet 3  . triamcinolone cream (KENALOG) 0.1 % Apply topically 2 (two) times daily. For itchy rash 30 g 1   No current facility-administered medications for this visit.     PHYSICAL EXAMINATION: ECOG PERFORMANCE STATUS: 1 - Symptomatic but completely ambulatory  Vitals:   01/29/19 1142  BP: (!) 159/68  Pulse: 91  Resp: 17  Temp: 98.5 F (36.9 C)  SpO2: 99%   Filed Weights   01/29/19 1142  Weight: 93.3 kg    GENERAL: alert, no distress and comfortable SKIN: skin color, texture, turgor are normal, no rashes or significant lesions EYES: normal, Conjunctiva are pink and non-injected, sclera clear OROPHARYNX: no exudate, no erythema and lips, buccal mucosa, and tongue normal  NECK: supple, thyroid normal size, non-tender, without nodularity LYMPH: no palpable lymphadenopathy in the cervical, axillary or inguinal LUNGS: clear to auscultation and percussion with normal breathing effort HEART: regular rate & rhythm and no murmurs and no lower extremity edema ABDOMEN: abdomen soft, non-tender and normal bowel sounds MUSCULOSKELETAL: no cyanosis of digits and no clubbing  NEURO: alert & oriented x 3 with fluent speech, no focal motor/sensory deficits EXTREMITIES: No lower extremity edema BREAST: Mild lymphadema in the right breast in the medial aspect; no palpable lumps or nodules. No palpable axillary supraclavicular or infraclavicular adenopathy no breast tenderness or nipple discharge. (exam performed in the presence of a chaperone)   LABORATORY DATA:  I have reviewed the data as listed CMP Latest Ref Rng & Units 10/28/2018 05/07/2018 01/01/2018  Glucose 70 - 99 mg/dL 116(H)  111(H) 96  BUN 6 - 23 mg/dL '19 21 18  ' Creatinine 0.40 - 1.20 mg/dL 1.01 0.99 0.95  Sodium 135 - 145 mEq/L 139 139 139  Potassium 3.5 - 5.1 mEq/L 4.1 4.2 4.6  Chloride 96 - 112 mEq/L 103 103 101  CO2 19 - 32 mEq/L '29 30 31  ' Calcium 8.4 - 10.5 mg/dL 9.8 9.6 9.8  Total Protein 6.0 - 8.3 g/dL 7.1 7.2 7.2  Total Bilirubin 0.2 - 1.2 mg/dL 0.3 0.3 0.3  Alkaline Phos 39 - 117 U/L 102 98 95  AST 0 - 37 U/L '13 14 13  ' ALT 0 - 35 U/L '8 10 8    ' Lab Results  Component Value Date   WBC 8.8 07/20/2016   HGB 12.1 07/20/2016   HCT 36.9 07/20/2016   MCV 91.7 07/20/2016   PLT 323.0 07/20/2016   NEUTROABS 6.0 07/20/2016    ASSESSMENT & PLAN:  Malignant neoplasm of upper-outer quadrant of right breast in female, estrogen receptor positive (H[facility]) Right breast mass 9:00 position 1 cm from nipple: 4 mm; small asymmetry in the upper outer quadrant measuring 1 cm 04/19/2017:Right breast stereotactic biopsy: IDC with DCIS, biopsy 9:00 position: [facility], ER 100%, PR 100% HER-2 Neg 05/21/17: Right lumpectomy: IDC grade 1, microscopic focus, low-grade DCIS, 0/3 lymph nodes negative, Atypical  lobular hyperplasia, ER 100%, PR 100%, HER-2 negative ratio 1.63, Ki-67 insufficient, T46mc, N0 stage IA  Adj XRT 06/18/17- 07/17/17 Current Treatment: Anastrozole 1 mg daily Anastrozole toxicities: 1.  Hot flashes 2. Myalgias  Breast cancer surveillance: 1.  Mammogram 09/11/2018: Benign breast density category B 2.  Breast exam 01/29/2019: Benign  Breast lymphedema: Patient massages the breast.    RTC in 1 year    No orders of the defined types were placed in this encounter.  The patient has a good understanding of the overall plan. she agrees with it. she will call with any problems that may develop before the next visit here.  GNicholas Lose MD 01/29/2019  IJulious OkaDorshimer am acting as scribe for Dr. VNicholas Lose  I have reviewed the above documentation for accuracy and completeness, and I agree with the  above.

## 2019-02-05 ENCOUNTER — Other Ambulatory Visit: Payer: Self-pay | Admitting: Hematology and Oncology

## 2019-02-05 DIAGNOSIS — Z170 Estrogen receptor positive status [ER+]: Principal | ICD-10-CM

## 2019-02-05 DIAGNOSIS — C50411 Malignant neoplasm of upper-outer quadrant of right female breast: Secondary | ICD-10-CM

## 2019-02-21 DIAGNOSIS — R6889 Other general symptoms and signs: Secondary | ICD-10-CM | POA: Diagnosis not present

## 2019-02-21 DIAGNOSIS — J069 Acute upper respiratory infection, unspecified: Secondary | ICD-10-CM | POA: Diagnosis not present

## 2019-02-21 NOTE — Telephone Encounter (Signed)
Spoke with pt and advised her to go to Urgent Care due to the symptoms that she is having. Pt stated that she would go to [facility] in.

## 2019-06-11 ENCOUNTER — Other Ambulatory Visit: Payer: Self-pay | Admitting: Internal Medicine

## 2019-06-11 DIAGNOSIS — I10 Essential (primary) hypertension: Secondary | ICD-10-CM

## 2019-06-11 MED ORDER — TELMISARTAN-HCTZ 80-25 MG PO TABS: 1.0000 | ORAL_TABLET | Freq: Every evening | ORAL | 1 refills | Status: DC

## 2019-06-11 NOTE — Telephone Encounter (Signed)
Request for lisinopril-hctz.

## 2019-06-20 DIAGNOSIS — E7800 Pure hypercholesterolemia, unspecified: Secondary | ICD-10-CM

## 2019-06-20 DIAGNOSIS — E1121 Type 2 diabetes mellitus with diabetic nephropathy: Secondary | ICD-10-CM

## 2019-06-30 ENCOUNTER — Other Ambulatory Visit: Payer: Self-pay | Admitting: Internal Medicine

## 2019-06-30 ENCOUNTER — Telehealth: Payer: Self-pay | Admitting: *Deleted

## 2019-06-30 NOTE — Telephone Encounter (Signed)
Copied from [place] [phone]. Topic: Appointment Scheduling - Scheduling Inquiry for Clinic >> Jun 30, 2019  1:28 PM Alanda Slim E wrote: Reason for CRM: Pt needs an appt  and an appt for blood work/ please advise

## 2019-07-16 ENCOUNTER — Other Ambulatory Visit: Payer: Self-pay

## 2019-07-16 ENCOUNTER — Other Ambulatory Visit (INDEPENDENT_AMBULATORY_CARE_PROVIDER_SITE_OTHER): Payer: Medicare Other

## 2019-07-16 DIAGNOSIS — E1121 Type 2 diabetes mellitus with diabetic nephropathy: Secondary | ICD-10-CM

## 2019-07-16 DIAGNOSIS — E7800 Pure hypercholesterolemia, unspecified: Secondary | ICD-10-CM

## 2019-07-16 LAB — COMPREHENSIVE METABOLIC PANEL
ALT: 9 U/L (ref 0–35)
AST: 13 U/L (ref 0–37)
Albumin: 4.1 g/dL (ref 3.5–5.2)
Alkaline Phosphatase: 112 U/L (ref 39–117)
BUN: 26 mg/dL — ABNORMAL HIGH (ref 6–23)
CO2: 28 mEq/L (ref 19–32)
Calcium: 10 mg/dL (ref 8.4–10.5)
Chloride: 104 mEq/L (ref 96–112)
Creatinine, Ser: 1.24 mg/dL — ABNORMAL HIGH (ref 0.40–1.20)
GFR: 51.88 mL/min — ABNORMAL LOW (ref >60.00)
Glucose, Bld: 121 mg/dL — ABNORMAL HIGH (ref 70–99)
Potassium: 4.8 mEq/L (ref 3.5–5.1)
Sodium: 139 mEq/L (ref 135–145)
Total Bilirubin: 0.4 mg/dL (ref 0.2–1.2)
Total Protein: 6.6 g/dL (ref 6.0–8.3)

## 2019-07-16 LAB — HEMOGLOBIN A1C: Hgb A1c MFr Bld: 6.7 % — ABNORMAL HIGH (ref 4.6–6.5)

## 2019-07-16 LAB — LIPID PANEL
Cholesterol: 151 mg/dL (ref 0–200)
HDL: 57.6 mg/dL (ref >39.00)
LDL Cholesterol: 72 mg/dL (ref 0–99)
NonHDL: 93.56
Total CHOL/HDL Ratio: 3
Triglycerides: 106 mg/dL (ref 0.0–149.0)
VLDL: 21.2 mg/dL (ref 0.0–40.0)

## 2019-07-17 ENCOUNTER — Encounter: Payer: Self-pay | Admitting: Internal Medicine

## 2019-07-17 ENCOUNTER — Ambulatory Visit (INDEPENDENT_AMBULATORY_CARE_PROVIDER_SITE_OTHER): Payer: Medicare Other | Admitting: Internal Medicine

## 2019-07-17 VITALS — Ht 62.5 in | Wt 208.0 lb

## 2019-07-17 DIAGNOSIS — R944 Abnormal results of kidney function studies: Secondary | ICD-10-CM | POA: Diagnosis not present

## 2019-07-17 DIAGNOSIS — I10 Essential (primary) hypertension: Secondary | ICD-10-CM | POA: Diagnosis not present

## 2019-07-17 DIAGNOSIS — E7800 Pure hypercholesterolemia, unspecified: Secondary | ICD-10-CM

## 2019-07-17 DIAGNOSIS — E119 Type 2 diabetes mellitus without complications: Secondary | ICD-10-CM | POA: Diagnosis not present

## 2019-07-17 NOTE — Progress Notes (Signed)
Virtual Visit via [email or web address]  this visit type was conducted due to national recommendations for restrictions regarding the COVID-19 pandemic (e.g. social distancing).  This format is felt to be most appropriate for this patient at this time.  All issues noted in this document were discussed and addressed.  No physical exam was performed (except for noted visual exam findings with Video Visits).   I connected with@ on 07/17/19 at 10:00 AM EDT by a video enabled telemedicine application and verified that I am speaking with the correct person using two identifiers. Location patient: home Location provider: work or home office Persons participating in the virtual visit: patient, provider  I discussed the limitations, risks, security and privacy concerns of performing an evaluation and management service by telephone and the availability of in person appointments. I also discussed with the patient that there may be a patient responsible charge related to this service. The patient expressed understanding and agreed to proceed.   Reason for visit: follow up on type 2 dm, hypertension   HPI:  68 yr old [name] with T2DM, hypertension, Breast cancer last seen Nov 2019 for follow up.  1 6 month follow up on diabetes.  Patient has no complaints today.  Patient is following a low glycemic index diet 75% of the time.  Fasting sugars have been under 140 most of the time and post prandials have been under 160 except on rare occasions. Patient is not exercising or intentionally trying to lose weight .  Patient has had an eye exam in the last 12 months and checks feet regularly for signs of infection.  Patient does not walk barefoot outside,  And denies an numbness tingling or burning in feet. Patient is up to date on all recommended vaccinations   2) SHOULDER PAIN:  She has had a recent fall and injured her right shoulder .  She has been using mobic for the pain associated with the shoulder injury.  ROS: See  pertinent positives and negatives per HPI.  3) Hypertension: patient checks blood pressure twice weekly at home.  Readings have been for the most part < 140/80 at rest . Patient is following a reduce salt diet most days and is taking medications as prescribed  Past Medical History:  Diagnosis Date  . Arthritis   . 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 Cancer ([place]) 04/2017   right breast cancer  . Colon polyp 2008  . Diabetes mellitus without complication ([place])   . Endometrial hyperplasia 2013  . GERD (gastroesophageal reflux disease)    OCC-NO MEDS  . Headache    H/O MIGRAINES  . Hypertension   . Papilloma 11/20/2016   right breast  . Status post dilation of esophageal narrowing 2008    Past Surgical History:  Procedure Laterality Date  . BREAST BIOPSY Right 11/02/2016   ATYPICAL PAPILLARY LESION,  . BREAST EXCISIONAL BIOPSY Right 11/20/2016   AHD  . BREAST LUMPECTOMY Right 11/20/2016   Procedure: BREAST LUMPECTOMY;  Surgeon: [name], MD;  Location: [facility] ORS;  Service: General;  Laterality: Right;  . COLONOSCOPY  2008   in [place]  . DILATION AND CURETTAGE OF UTERUS    . RADIOACTIVE SEED GUIDED PARTIAL MASTECTOMY WITH AXILLARY SENTINEL LYMPH NODE BIOPSY Right 05/16/2017   Procedure: RIGHT RADIOACTIVE SEED GUIDED LUMPECTOMY  WITH  RIGHT BREAST SEED GUIDED EXCISIONAL BIOPSY AND RIGHT AXILLARY SENTINEL  NODE BIOPSY;  Surgeon: [name], MD;  Location: [place];  Service: General;  Laterality: Right;  2 SEEDS RIGHT BREAST  GENERAL AND PEC BLOCK ANESHTESIA    Family History  Problem Relation Age of Onset  . Hypertension Mother   . Heart disease Father 90  . Bone cancer Brother   . Hypertension Daughter   . Cancer Maternal Aunt 80       breast and ovary  . Pancreatic cancer Maternal Aunt   . Lymphoma Maternal Aunt      SOCIAL HX:  reports that she quit smoking about 29 years ago. Her smoking use included  cigarettes. She has a 1.25 pack-year smoking history. She has never used smokeless tobacco. She reports current alcohol use. She reports that she does not use drugs.  Current Outpatient Medications:  .  anastrozole (ARIMIDEX) 1 MG tablet, TAKE 1 TABLET BY MOUTH EVERY DAY, Disp: 90 tablet, Rfl: 3 .  aspirin 81 MG tablet, Take 81 mg by mouth daily., Disp: , Rfl:  .  cyclobenzaprine (FLEXERIL) 10 MG tablet, Take 1 tablet (10 mg total) by mouth 3 (three) times daily as needed for muscle spasms., Disp: 90 tablet, Rfl: 1 .  fluticasone (FLONASE) 50 MCG/ACT nasal spray, Place 2 sprays into both nostrils daily., Disp: 16 g, Rfl: 2 .  glucose blood test strip, Use daily to test blood sugar E11.9, Disp: 100 each, Rfl: 12 .  meloxicam (MOBIC) 15 MG tablet, TAKE 1 TABLET BY MOUTH EVERY DAY, Disp: 30 tablet, Rfl: 0 .  meloxicam (MOBIC) 15 MG tablet, TAKE 1 TABLET BY MOUTH  DAILY, Disp: 90 tablet, Rfl: 1 .  metFORMIN (GLUCOPHAGE) 500 MG tablet, TAKE 1 TABLET BY MOUTH TWO  TIMES DAILY WITH MEALS, Disp: 180 tablet, Rfl: 3 .  non-metallic deodorant (ALRA) MISC, Apply 1 application topically., Disp: , Rfl:  .  simvastatin (ZOCOR) 40 MG tablet, TAKE 1 TABLET BY MOUTH  DAILY, Disp: 90 tablet, Rfl: 3 .  telmisartan-hydrochlorothiazide (MICARDIS HCT) 80-25 MG tablet, Take 1 tablet by mouth every evening., Disp: 90 tablet, Rfl: 1 .  triamcinolone cream (KENALOG) 0.1 %, Apply topically 2 (two) times daily. For itchy rash, Disp: 30 g, Rfl: 1  EXAM:  VITALS per patient if applicable:  GENERAL: alert, oriented, appears well and in no acute distress  HEENT: atraumatic, conjunttiva clear, no obvious abnormalities on inspection of external nose and ears  NECK: normal movements of the head and neck  LUNGS: on inspection no signs of respiratory distress, breathing rate appears normal, no obvious gross SOB, gasping or wheezing  CV: no obvious cyanosis  MS: moves all visible extremities without noticeable  abnormality  PSYCH/NEURO: pleasant and cooperative, no obvious depression or anxiety, speech and thought processing grossly intact  ASSESSMENT AND PLAN:  Discussed the following assessment and plan:  Diabetes mellitus type 2, controlled  Historically well-controlled on diet alone .  hemoglobin A1c has been consistently at or  less than 7.0 . Patient is up-to-date on eye exams and foot exam is normal today. Patient has no microalbuminuria. Patient is tolerating statin therapy for CAD risk reduction and on ACE/ARB for renal protection and hypertension   Lab Results  Component Value Date   HGBA1C 6.7 (H) 07/16/2019   Lab Results  Component Value Date   MICROALBUR 1.1 10/28/2018     Essential hypertension, benign Well controlled on current regimen. Renal function  Had declined however,  And will be repeated before stopping diuretic. Marland Kitchen  Decreased GFR Noted during fasting labs,  And ay be due to fasting,  Use of meloxicam ,  or use of hctz  Hyperlipidemia .LDL and triglycerides are at goal on simvastatin . SHE has no side effects and liver enzymes are normal. No changes today   Lab Results  Component Value Date   CHOL 151 07/16/2019   HDL 57.60 07/16/2019   LDLCALC 72 07/16/2019   LDLDIRECT 71.0 09/14/2017   TRIG 106.0 07/16/2019   CHOLHDL 3 07/16/2019   Lab Results  Component Value Date   ALT 9 07/16/2019   AST 13 07/16/2019   ALKPHOS 112 07/16/2019   BILITOT 0.4 07/16/2019       I discussed the assessment and treatment plan with the patient. The patient was provided an opportunity to ask questions and all were answered. The patient agreed with the plan and demonstrated an understanding of the instructions.   The patient was advised to call back or seek an in-person evaluation if the symptoms worsen or if the condition fails to improve as anticipated.  I provided 25 minutes of non-face-to-face time during this encounter.   [name], MD

## 2019-07-17 NOTE — Patient Instructions (Signed)
Your labs are excellent,  except for your  kidney function which is slightly low compared to last check.  This may be due to one of several issues:  1) lack of hydration 2) use of meloxicam or other non steroidal anti inflammatories (advil, motrin ,  Aleve) 3) use of HCTZ (a diuretic)    [name] will call you with an appointment to retest your kidney function next week. , MAKE SURE YOU HAVE DRANK AT LAST 16 OUNCES OF WATER THAT DAY PRIOR TO THE LAB DRAW

## 2019-07-20 DIAGNOSIS — N183 Chronic kidney disease, stage 3 unspecified: Secondary | ICD-10-CM | POA: Insufficient documentation

## 2019-07-20 DIAGNOSIS — N182 Chronic kidney disease, stage 2 (mild): Secondary | ICD-10-CM | POA: Insufficient documentation

## 2019-07-20 DIAGNOSIS — E1122 Type 2 diabetes mellitus with diabetic chronic kidney disease: Secondary | ICD-10-CM | POA: Insufficient documentation

## 2019-07-20 DIAGNOSIS — R944 Abnormal results of kidney function studies: Secondary | ICD-10-CM | POA: Insufficient documentation

## 2019-07-20 NOTE — Assessment & Plan Note (Signed)
Well controlled on current regimen. Renal function  Had declined however,  And will be repeated before stopping diuretic. Marland Kitchen

## 2019-07-20 NOTE — Assessment & Plan Note (Signed)
.  LDL and triglycerides are at goal on simvastatin . SHE has no side effects and liver enzymes are normal. No changes today   Lab Results  Component Value Date   CHOL 151 07/16/2019   HDL 57.60 07/16/2019   LDLCALC 72 07/16/2019   LDLDIRECT 71.0 09/14/2017   TRIG 106.0 07/16/2019   CHOLHDL 3 07/16/2019   Lab Results  Component Value Date   ALT 9 07/16/2019   AST 13 07/16/2019   ALKPHOS 112 07/16/2019   BILITOT 0.4 07/16/2019

## 2019-07-20 NOTE — Assessment & Plan Note (Signed)
Noted during fasting labs,  And ay be due to fasting,  Use of meloxicam , or use of hctz

## 2019-07-20 NOTE — Assessment & Plan Note (Signed)
Historically well-controlled on diet alone .  hemoglobin A1c has been consistently at or  less than 7.0 . Patient is up-to-date on eye exams and foot exam is normal today. Patient has no microalbuminuria. Patient is tolerating statin therapy for CAD risk reduction and on ACE/ARB for renal protection and hypertension   Lab Results  Component Value Date   HGBA1C 6.7 (H) 07/16/2019   Lab Results  Component Value Date   MICROALBUR 1.1 10/28/2018

## 2019-07-24 NOTE — Telephone Encounter (Signed)
LMTCB. Need to schedule pt for a nonfasting lab appt.  

## 2019-07-28 ENCOUNTER — Other Ambulatory Visit: Payer: Medicare Other

## 2019-07-29 ENCOUNTER — Other Ambulatory Visit (INDEPENDENT_AMBULATORY_CARE_PROVIDER_SITE_OTHER): Payer: Medicare Other

## 2019-07-29 ENCOUNTER — Other Ambulatory Visit: Payer: Self-pay

## 2019-07-29 DIAGNOSIS — R944 Abnormal results of kidney function studies: Secondary | ICD-10-CM

## 2019-07-30 LAB — RENAL FUNCTION PANEL
Albumin: 4.3 g/dL (ref 3.5–5.2)
BUN: 17 mg/dL (ref 6–23)
CO2: 27 mEq/L (ref 19–32)
Calcium: 10.3 mg/dL (ref 8.4–10.5)
Chloride: 101 mEq/L (ref 96–112)
Creatinine, Ser: 0.99 mg/dL (ref 0.40–1.20)
GFR: 67.26 mL/min (ref >60.00)
Glucose, Bld: 94 mg/dL (ref 70–99)
Phosphorus: 3.3 mg/dL (ref 2.3–4.6)
Potassium: 4.5 mEq/L (ref 3.5–5.1)
Sodium: 138 mEq/L (ref 135–145)

## 2019-08-08 ENCOUNTER — Other Ambulatory Visit: Payer: Self-pay | Admitting: General Surgery

## 2019-08-08 DIAGNOSIS — Z853 Personal history of malignant neoplasm of breast: Secondary | ICD-10-CM

## 2019-08-14 ENCOUNTER — Other Ambulatory Visit: Payer: Self-pay | Admitting: Internal Medicine

## 2019-09-09 ENCOUNTER — Other Ambulatory Visit: Payer: Self-pay | Admitting: Internal Medicine

## 2019-09-11 ENCOUNTER — Ambulatory Visit (INDEPENDENT_AMBULATORY_CARE_PROVIDER_SITE_OTHER): Payer: Medicare Other

## 2019-09-11 ENCOUNTER — Other Ambulatory Visit: Payer: Self-pay

## 2019-09-11 DIAGNOSIS — Z23 Encounter for immunization: Secondary | ICD-10-CM | POA: Diagnosis not present

## 2019-09-24 ENCOUNTER — Other Ambulatory Visit: Payer: Self-pay

## 2019-09-24 ENCOUNTER — Ambulatory Visit
Admission: RE | Admit: 2019-09-24 | Discharge: 2019-09-24 | Disposition: A | Discharge status: Home / Self Care | Payer: Medicare Other | Source: Ambulatory Visit | Attending: General Surgery | Admitting: General Surgery

## 2019-09-24 DIAGNOSIS — Z853 Personal history of malignant neoplasm of breast: Secondary | ICD-10-CM

## 2019-09-24 DIAGNOSIS — R928 Other abnormal and inconclusive findings on diagnostic imaging of breast: Secondary | ICD-10-CM | POA: Diagnosis not present

## 2019-09-24 HISTORY — DX: Personal history of irradiation: Z92.3

## 2019-09-26 ENCOUNTER — Telehealth: Payer: Self-pay

## 2019-09-26 NOTE — Telephone Encounter (Signed)
Patient was in for nurse visit today on 09/11/19 for tetanus and pneumonia shot and left log of bp readings from home machine for Dr. Derrel Nip.  Readings left in Quick Sign folder for Dr. Derrel Nip to review.

## 2019-09-29 MED ORDER — METOPROLOL SUCCINATE ER 25 MG PO TB24: 25.0000 mg | ORAL_TABLET | Freq: Every day | ORAL | 3 refills | Status: DC

## 2019-09-29 NOTE — Telephone Encounter (Signed)
Thank you for the home  BP READINGS.  Unfortunately they are still elevated .  Continue your current  TELMISARTAN dos e( it is at maximal dose )  AND I am recommending that we add TOPROL XL 25 MG ONCE DAILY AT BEDTIME.  SENT TO MAIL ORDER

## 2019-10-02 ENCOUNTER — Other Ambulatory Visit: Payer: Self-pay | Admitting: Internal Medicine

## 2019-10-02 NOTE — Telephone Encounter (Addendum)
This is the patient I discussed with you earlier today patient did not leave any BP reading on 09/11/19 at nurse visit she has not left  Any readings ever. Advised patient to not pick up TOPROL XL and  Medication removed  from patient chart.

## 2019-10-10 ENCOUNTER — Ambulatory Visit (INDEPENDENT_AMBULATORY_CARE_PROVIDER_SITE_OTHER): Payer: Medicare Other | Admitting: *Deleted

## 2019-10-10 ENCOUNTER — Other Ambulatory Visit: Payer: Self-pay

## 2019-10-10 DIAGNOSIS — Z23 Encounter for immunization: Secondary | ICD-10-CM

## 2019-10-13 ENCOUNTER — Encounter: Payer: Self-pay | Admitting: Family Medicine

## 2019-10-13 ENCOUNTER — Other Ambulatory Visit: Payer: Self-pay

## 2019-10-13 ENCOUNTER — Other Ambulatory Visit: Payer: Self-pay | Admitting: Internal Medicine

## 2019-10-13 ENCOUNTER — Ambulatory Visit (INDEPENDENT_AMBULATORY_CARE_PROVIDER_SITE_OTHER): Payer: Medicare Other | Admitting: Family Medicine

## 2019-10-13 VITALS — BP 116/64 | HR 82 | Temp 98.3°F | Wt 211.0 lb

## 2019-10-13 DIAGNOSIS — E119 Type 2 diabetes mellitus without complications: Secondary | ICD-10-CM

## 2019-10-13 DIAGNOSIS — L600 Ingrowing nail: Secondary | ICD-10-CM

## 2019-10-13 NOTE — Progress Notes (Signed)
Subjective:    Patient ID: Martha [name], female    DOB: 09/21/1951, 68 y.o.   MRN: [id number]  HPI   Patient presents to clinic due to possible ingrown toenail left and/or right great toe and need foot exam.  Patient is a diabetic, so does check her feet regularly.  When clipping her toenails does try to cut straight across to avoid ingrown toenails.  Patient did have some pain in left great toe, did clip her toenails and once did the clipping pain did resolve.  Patient Active Problem List   Diagnosis Date Noted  . Decreased GFR 07/20/2019  . Use of anastrozole (Arimidex) 01/03/2018  . Localized maculopapular rash 06/16/2017  . Malignant neoplasm of upper-outer quadrant of right breast in female, estrogen receptor positive ([place]) 04/26/2017  . Low back pain 01/29/2017  . Atypical ductal hyperplasia of right breast 01/15/2017  . Allergic rhinitis 04/12/2015  . Diabetes mellitus type 2, controlled ([place]) 07/24/2013  . Hyperlipidemia 07/24/2013  . Essential hypertension, benign 07/24/2013  . Status post dilation of esophageal narrowing 07/24/2013  . Obesity, unspecified 07/24/2013   Social History   Tobacco Use  . Smoking status: Former Smoker    Packs/day: 0.25    Years: 5.00    Pack years: 1.25    Types: Cigarettes    Quit date: 07/24/1990    Years since quitting: 29.2  . Smokeless tobacco: Never Used  Substance Use Topics  . Alcohol use: Yes    Comment: Socially   Review of Systems  Constitutional: Negative for chills, fatigue and fever.  HENT: Negative for congestion, ear pain, sinus pain and sore throat.   Eyes: Negative.   Respiratory: Negative for cough, shortness of breath and wheezing.   Cardiovascular: Negative for chest pain, palpitations and leg swelling.  Gastrointestinal: Negative for abdominal pain, diarrhea, nausea and vomiting.  Genitourinary: Negative for dysuria, frequency and urgency.  Musculoskeletal: Negative for arthralgias and myalgias.   Skin: ?ingrown toenail Neurological: Negative for syncope, light-headedness and headaches.  Psychiatric/Behavioral: The patient is not nervous/anxious.       Objective:   Physical Exam Vitals signs and nursing note reviewed.  Constitutional:      General: She is not in acute distress.    Appearance: She is not toxic-appearing.  HENT:     Head: Normocephalic and atraumatic.  Cardiovascular:     Rate and Rhythm: Normal rate and regular rhythm.     Pulses:          Dorsalis pedis pulses are 2+ on the right side and 2+ on the left side.       Posterior tibial pulses are 2+ on the right side and 2+ on the left side.     Heart sounds: Normal heart sounds.  Pulmonary:     Effort: Pulmonary effort is normal. No respiratory distress.     Breath sounds: Normal breath sounds.  Musculoskeletal:     Right lower leg: No edema.     Left lower leg: No edema.  Feet:     Right foot:     Protective Sensation: 4 sites tested. 4 sites sensed.     Skin integrity: Skin integrity normal.     Left foot:     Protective Sensation: 4 sites tested. 4 sites sensed.     Skin integrity: Skin integrity normal.     Comments: Right great toenail does appear more thick than others with some curving down of sides Left great toenail does not appear  ingrown at this time; appears patient was able to take care of issue by clipping nail on her own.  Skin:    General: Skin is warm and dry.     Coloration: Skin is not jaundiced or pale.  Neurological:     General: No focal deficit present.     Mental Status: She is alert.     Cranial Nerves: No cranial nerve deficit.     Motor: No weakness.     Gait: Gait normal.  Psychiatric:        Mood and Affect: Mood normal.        Behavior: Behavior normal.     Today's Vitals   10/13/19 1031  BP: 116/64  Pulse: 82  Temp: 98.3 F (36.8 C)  SpO2: 99%  Weight: 211 lb (95.7 kg)   Body mass index is 37.98 kg/m.     Assessment & Plan:    Type 2 diabetes/ingrown  toenails - patient's diabetic foot exam performed and she does have sensation in both feet.  No breaks in skin and toenails overall do appear in good health.  Right great toenail is thicker when compared to left.  No obvious ingrown areas at this time, but will refer to podiatry for further evaluation and and nail trimming.  Advised patient that I do suspect that her right great toe may have to be trimmed more so than the left.  Patient aware podiatry should be reaching out to her regards to referral; she should hear something in the next 1 to 2 weeks.

## 2019-10-13 NOTE — Patient Instructions (Signed)
Ingrown Toenail An ingrown toenail occurs when the corner or sides of a toenail grow into the surrounding skin. This causes discomfort and pain. The big toe is most commonly affected, but any of the toes can be affected. If an ingrown toenail is not treated, it can become infected. What are the causes? This condition may be caused by:  Wearing shoes that are too small or tight.  An injury, such as stubbing your toe or having your toe stepped on.  Improper cutting or care of your toenails.  Having nail or foot abnormalities that were present from birth (congenital abnormalities), such as having a nail that is too big for your toe. What increases the risk? The following factors may make you more likely to develop ingrown toenails:  Age. Nails tend to get thicker with age, so ingrown nails are more common among older people.  Cutting your toenails incorrectly, such as cutting them very short or cutting them unevenly. An ingrown toenail is more likely to get infected if you have:  Diabetes.  Blood flow (circulation) problems. What are the signs or symptoms? Symptoms of an ingrown toenail may include:  Pain, soreness, or tenderness.  Redness.  Swelling.  Hardening of the skin that surrounds the toenail. Signs that an ingrown toenail may be infected include:  Fluid or pus.  Symptoms that get worse instead of better. How is this diagnosed? An ingrown toenail may be diagnosed based on your medical history, your symptoms, and a physical exam. If you have fluid or blood coming from your toenail, a sample may be collected to test for the specific type of bacteria that is causing the infection. How is this treated? Treatment depends on how severe your ingrown toenail is. You may be able to care for your toenail at home.  If you have an infection, you may be prescribed antibiotic medicines.  If you have fluid or pus draining from your toenail, your health care provider may drain it.   If you have trouble walking, you may be given crutches to use.  If you have a severe or infected ingrown toenail, you may need a procedure to remove part or all of the nail. Follow these instructions at home: Foot care   Do not pick at your toenail or try to remove it yourself.  Soak your foot in warm, soapy water. Do this for 20 minutes, 3 times a day, or as often as told by your health care provider. This helps to keep your toe clean and keep your skin soft.  Wear shoes that fit well and are not too tight. Your health care provider may recommend that you wear open-toed shoes while you heal.  Trim your toenails regularly and carefully. Cut your toenails straight across to prevent injury to the skin at the corners of the toenail. Do not cut your nails in a curved shape.  Keep your feet clean and dry to help prevent infection. Medicines  Take over-the-counter and prescription medicines only as told by your health care provider.  If you were prescribed an antibiotic, take it as told by your health care provider. Do not stop taking the antibiotic even if you start to feel better. Activity  Return to your normal activities as told by your health care provider. Ask your health care provider what activities are safe for you.  Avoid activities that cause pain. General instructions  If your health care provider told you to use crutches to help you move around, use them   as instructed.  Keep all follow-up visits as told by your health care provider. This is important. Contact a health care provider if:  You have more redness, swelling, pain, or other symptoms that do not improve with treatment.  You have fluid, blood, or pus coming from your toenail. Get help right away if:  You have a red streak on your skin that starts at your foot and spreads up your leg.  You have a fever. Summary  An ingrown toenail occurs when the corner or sides of a toenail grow into the surrounding skin.  This causes discomfort and pain. The big toe is most commonly affected, but any of the toes can be affected.  If an ingrown toenail is not treated, it can become infected.  Fluid or pus draining from your toenail is a sign of infection. Your health care provider may need to drain it. You may be given antibiotics to treat the infection.  Trimming your toenails regularly and properly can help you prevent an ingrown toenail. This information is not intended to replace advice given to you by your health care provider. Make sure you discuss any questions you have with your health care provider. Document Released: 12/08/2000 Document Revised: 04/04/2019 Document Reviewed: 08/29/2017 Elsevier Patient Education  2020 Elsevier Inc.  

## 2019-10-23 ENCOUNTER — Ambulatory Visit: Payer: Medicare Other | Admitting: Podiatry

## 2019-10-30 ENCOUNTER — Ambulatory Visit (INDEPENDENT_AMBULATORY_CARE_PROVIDER_SITE_OTHER): Payer: Medicare Other | Admitting: Internal Medicine

## 2019-10-30 ENCOUNTER — Ambulatory Visit (INDEPENDENT_AMBULATORY_CARE_PROVIDER_SITE_OTHER): Payer: Medicare Other

## 2019-10-30 ENCOUNTER — Other Ambulatory Visit: Payer: Self-pay

## 2019-10-30 VITALS — BP 120/80 | HR 86 | Temp 97.2°F | Ht 62.5 in | Wt 212.4 lb

## 2019-10-30 DIAGNOSIS — E119 Type 2 diabetes mellitus without complications: Secondary | ICD-10-CM

## 2019-10-30 DIAGNOSIS — Z0000 Encounter for general adult medical examination without abnormal findings: Secondary | ICD-10-CM

## 2019-10-30 DIAGNOSIS — Z6836 Body mass index (BMI) 36.0-36.9, adult: Secondary | ICD-10-CM

## 2019-10-30 DIAGNOSIS — E7800 Pure hypercholesterolemia, unspecified: Secondary | ICD-10-CM | POA: Diagnosis not present

## 2019-10-30 DIAGNOSIS — E1121 Type 2 diabetes mellitus with diabetic nephropathy: Secondary | ICD-10-CM

## 2019-10-30 DIAGNOSIS — I10 Essential (primary) hypertension: Secondary | ICD-10-CM

## 2019-10-30 MED ORDER — TRAZODONE HCL 50 MG PO TABS: 25.0000 mg | ORAL_TABLET | Freq: Every evening | ORAL | 3 refills | Status: DC | PRN

## 2019-10-30 MED ORDER — ZOSTER VAC RECOMB ADJUVANTED 50 MCG/0.5ML IM SUSR: 0.5000 mL | Freq: Once | INTRAMUSCULAR | 1 refills | Status: AC

## 2019-10-30 NOTE — Progress Notes (Addendum)
Subjective:   Martha [name] is a 68 y.o. female who presents for Medicare Annual (Subsequent) preventive examination.  Review of Systems:  No ROS.  Medicare Wellness Virtual Visit.  Visual/audio telehealth visit, UTA vital signs.   See social history for additional risk factors.   Cardiac Risk Factors include: advanced age (>57men, >20 women);diabetes mellitus;hypertension     Objective:     Vitals: There were no vitals taken for this visit.  There is no height or weight on file to calculate BMI.  Advanced Directives 10/30/2019 10/03/2018 02/06/2018 10/02/2017 08/21/2017 05/31/2017 05/29/2017  Does Patient Have a Medical Advance Directive? Yes No No No No No No  Type of Paramedic of [place];Living will - - - - - -  Does patient want to make changes to medical advance directive? No - Patient declined - - Yes (MAU/Ambulatory/Procedural Areas - Information given) - - -  Copy of [place] in Chart? No - copy requested - - - - - -  Would patient like information on creating a medical advance directive? - No - Patient declined No - Patient declined - Yes (ED - Information included in AVS) No - Patient declined -    Tobacco Social History   Tobacco Use  Smoking Status Former Smoker  . Packs/day: 0.25  . Years: 5.00  . Pack years: 1.25  . Types: Cigarettes  . Quit date: 07/24/1990  . Years since quitting: 29.2  Smokeless Tobacco Never Used     Counseling given: Not Answered   Clinical Intake:  Pre-visit preparation completed: Yes        Diabetes: Yes(Followed by pcp)  How often do you need to have someone help you when you read instructions, pamphlets, or other written materials from your doctor or pharmacy?: 1 - Never  Interpreter Needed?: No     Past Medical History:  Diagnosis Date  . Arthritis   . 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 Cancer ([place]) 04/2017   right breast cancer  . Colon polyp 2008   . Diabetes mellitus without complication ([place])   . Endometrial hyperplasia 2013  . GERD (gastroesophageal reflux disease)    OCC-NO MEDS  . Headache    H/O MIGRAINES  . Hypertension   . Papilloma 11/20/2016   right breast  . Personal history of radiation therapy    2018  . Status post dilation of esophageal narrowing 2008   Past Surgical History:  Procedure Laterality Date  . BREAST BIOPSY Right 11/02/2016   ATYPICAL PAPILLARY LESION,  . BREAST EXCISIONAL BIOPSY Right 11/20/2016   AHD  . BREAST LUMPECTOMY Right 11/20/2016   Procedure: BREAST LUMPECTOMY;  Surgeon: [name], MD;  Location: [facility] ORS;  Service: General;  Laterality: Right;  . COLONOSCOPY  2008   in [place]  . DILATION AND CURETTAGE OF UTERUS    . RADIOACTIVE SEED GUIDED PARTIAL MASTECTOMY WITH AXILLARY SENTINEL LYMPH NODE BIOPSY Right 05/16/2017   Procedure: RIGHT RADIOACTIVE SEED GUIDED LUMPECTOMY  WITH  RIGHT BREAST SEED GUIDED EXCISIONAL BIOPSY AND RIGHT AXILLARY SENTINEL  NODE BIOPSY;  Surgeon: [name], MD;  Location: [place];  Service: General;  Laterality: Right;  2 SEEDS RIGHT BREAST  GENERAL AND PEC BLOCK ANESHTESIA   Family History  Problem Relation Age of Onset  . Hypertension Mother   . Heart disease Father 66  . Bone cancer Brother   . Hypertension Daughter   . Cancer Maternal Aunt  80       breast and ovary  . Breast cancer Maternal Aunt   . Pancreatic cancer Maternal Aunt   . Lymphoma Maternal Aunt    Social History   Socioeconomic History  . Marital status: Married    Spouse name: Not on file  . Number of children: Not on file  . Years of education: Not on file  . Highest education level: Not on file  Occupational History  . Not on file  Social Needs  . Financial resource strain: Not hard at all  . Food insecurity    Worry: Never true    Inability: Never true  . Transportation needs    Medical: No    Non-medical: No  Tobacco Use  . Smoking  status: Former Smoker    Packs/day: 0.25    Years: 5.00    Pack years: 1.25    Types: Cigarettes    Quit date: 07/24/1990    Years since quitting: 29.2  . Smokeless tobacco: Never Used  Substance and Sexual Activity  . Alcohol use: Yes    Comment: Socially  . Drug use: No  . Sexual activity: Not Currently  Lifestyle  . Physical activity    Days per week: 0 days    Minutes per session: Not on file  . Stress: Not at all  Relationships  . Social Herbalist on phone: Not on file    Gets together: Not on file    Attends religious service: Not on file    Active member of club or organization: Not on file    Attends meetings of clubs or organizations: Not on file    Relationship status: Not on file  Other Topics Concern  . Not on file  Social History Narrative   Lives in [place]. From [place]. Son lives with pt. Dog in home.      Work - [employer], and Theme park manager, now retired.      Diet - regular      Exercise - no regular    Outpatient Encounter Medications as of 10/30/2019  Medication Sig  . anastrozole (ARIMIDEX) 1 MG tablet TAKE 1 TABLET BY MOUTH EVERY DAY  . aspirin 81 MG tablet Take 81 mg by mouth daily.  . cyclobenzaprine (FLEXERIL) 10 MG tablet TAKE 1 TABLET BY MOUTH 3  TIMES DAILY AS NEEDED FOR  MUSCLE SPASM(S)  . fluticasone (FLONASE) 50 MCG/ACT nasal spray Place 2 sprays into both nostrils daily.  Marland Kitchen glucose blood test strip Use daily to test blood sugar E11.9  . meloxicam (MOBIC) 15 MG tablet TAKE 1 TABLET BY MOUTH EVERY DAY  . meloxicam (MOBIC) 15 MG tablet TAKE 1 TABLET BY MOUTH  DAILY  . metFORMIN (GLUCOPHAGE) 500 MG tablet TAKE 1 TABLET BY MOUTH TWO  TIMES DAILY WITH MEALS  . non-metallic deodorant (ALRA) MISC Apply 1 application topically.  . simvastatin (ZOCOR) 40 MG tablet TAKE 1 TABLET BY MOUTH  DAILY  . telmisartan-hydrochlorothiazide (MICARDIS HCT) 80-25 MG tablet Take 1 tablet by mouth every evening.  . triamcinolone cream (KENALOG) 0.1 % Apply  topically 2 (two) times daily. For itchy rash   No facility-administered encounter medications on file as of 10/30/2019.     Activities of Daily Living In your present state of health, do you have any difficulty performing the following activities: 10/30/2019 10/13/2019  Hearing? N N  Vision? N N  Difficulty concentrating or making decisions? N N  Walking or climbing stairs? N N  Dressing or bathing? N N  Doing errands, shopping? N N  Preparing Food and eating ? N -  Using the Toilet? N -  In the past six months, have you accidently leaked urine? N -  Do you have problems with loss of bowel control? N -  Managing your Medications? N -  Managing your Finances? N -  Housekeeping or managing your Housekeeping? N -  Some recent data might be hidden    Patient Care Team: [name], MD as PCP - General (Internal Medicine) [name], MD (General Surgery) [name], MD as Consulting Physician (Hematology and Oncology) Delice Bison, Charlestine Massed, NP as Nurse Practitioner (Hematology and Oncology) Kyung Rudd, MD as Consulting Physician (Radiation Oncology) [name], MD as Consulting Physician (General Surgery)    Assessment:   This is a routine wellness examination for Martha [name].  Nurse connected with patient 10/30/19 at  9:30 AM EST by a telephone enabled telemedicine application and verified that I am speaking with the correct person using two identifiers. Patient stated full name and DOB. Patient gave permission to continue with virtual visit. Patient's location was at home and Nurse's location was at [place] office.   Health Maintenance Due: -PNA - discussed; to be completed with doctor in visit or local pharmacy.  -Eye Exam- scheduled tomorrow -Hgb A1c- 06/26/19 (6.7) Update all pending maintenance due as appropriate.   See completed HM at the end of note.   Eye: Visual acuity not assessed. Virtual visit. Wears corrective lenses. Followed by their  ophthalmologist every 12 months.  Retinopathy- none reported  Dental: UTD   Hearing: Demonstrates normal hearing during visit.  Safety:  Patient feels safe at home- yes Patient does have smoke detectors at home- yes Patient does wear sunscreen or protective clothing when in direct sunlight - yes Patient does wear seat belt when in a moving vehicle - yes Patient drives- yes Adequate lighting in walkways free from debris- yes Grab bars and handrails used as appropriate- yes Ambulates with no assistive device Cell phone on person when ambulating outside of the home- yes  Social: Alcohol intake - yes      Smoking history- former    Smokers in home? none Illicit drug use? none  Depression: PHQ 2 &9 complete. See screening below. Denies irritability, anhedonia, sadness/tearfullness.    Falls: See screening below.    Medication: Taking as directed and without issues.   Covid-19: Precautions and sickness symptoms discussed. Wears mask, social distancing, hand hygiene as appropriate.   Activities of Daily Living Patient denies needing assistance with: household chores, feeding themselves, getting from bed to chair, getting to the toilet, bathing/showering, dressing, managing money, or preparing meals.   Memory: Patient is alert. Patient denies difficulty focusing or concentrating. Correctly identified the president of the [place], season and recall. Patient likes to read and play computer games for brain stimulation.   BMI- discussed the importance of a healthy diet, water intake and the benefits of aerobic exercise.  Educational material provided.  Physical activity- stretching. No routine.   Diet:  Regular Water: fair intake  Other Providers Patient Care Team: [name], MD as PCP - General (Internal Medicine) [name], MD (General Surgery) [name], MD as Consulting Physician (Hematology and Oncology) Delice Bison, Charlestine Massed, NP as Nurse  Practitioner (Hematology and Oncology) Kyung Rudd, MD as Consulting Physician (Radiation Oncology) [name], MD as Consulting Physician (General Surgery)  Exercise Activities and Dietary recommendations    Goals    .  Follow up with Primary Care Provider     As needed       Fall Risk Fall Risk  10/30/2019 10/13/2019 07/17/2019 10/03/2018 10/02/2017  Falls in the past year? 0 0 1 No No  Number falls in past yr: - - 0 - -  Injury with Fall? - - 0 - -  Follow up - Falls evaluation completed - - -   Timed Get Up and Go performed: no, virtual visit  Depression Screen PHQ 2/9 Scores 10/30/2019 07/17/2019 10/03/2018 10/02/2017  PHQ - 2 Score 0 0 0 0  PHQ- 9 Score - - - 0     Cognitive Function MMSE - Mini Mental State Exam 10/03/2018 10/02/2017 09/20/2016  Orientation to time 5 5 5   Orientation to Place 5 5 5   Registration 3 3 3   Attention/ Calculation 5 5 5   Recall 3 3 3   Language- name 2 objects 2 2 2   Language- repeat 1 1 1   Language- follow 3 step command 3 3 3   Language- read & follow direction 1 1 1   Write a sentence 1 1 1   Copy design 1 1 1   Total score 30 30 30      6CIT Screen 10/30/2019  What Year? 0 points  What month? 0 points  What time? 0 points  Count back from 20 0 points  Months in reverse 0 points  Repeat phrase 0 points  Total Score 0    Immunization History  Administered Date(s) Administered  . Fluad Quad(high Dose 65+) 09/11/2019  . Influenza Split 10/15/2014  . Influenza, High Dose Seasonal PF 10/03/2018  . Influenza,inj,Quad PF,6+ Mos 09/20/2016  . Influenza-Unspecified 10/24/2012, 10/12/2013, 10/01/2017  . Pneumococcal Conjugate-13 06/21/2015, 08/25/2017  . Pneumococcal Polysaccharide-23 10/24/2012  . Tdap 07/24/2009, 10/10/2019    Screening Tests Health Maintenance  Topic Date Due  . PNA vac Low Risk Adult (2 of 2 - PPSV23) 08/25/2018  . OPHTHALMOLOGY EXAM  04/02/2019  . COLONOSCOPY  07/27/2020 (Originally 07/24/2017)  .  HEMOGLOBIN A1C  01/16/2020  . FOOT EXAM  10/12/2020  . MAMMOGRAM  09/23/2021  . TETANUS/TDAP  10/09/2029  . INFLUENZA VACCINE  Completed  . DEXA SCAN  Completed      Plan:    Keep all routine maintenance appointments.   Follow up with your doctor today @ 10:00  Medicare Attestation I have personally reviewed: The patient's medical and social history Their use of alcohol, tobacco or illicit drugs Their current medications and supplements The patient's functional ability including ADLs,fall risks, home safety risks, cognitive, and hearing and visual impairment Diet and physical activities Evidence for depression   In addition, I have reviewed and discussed with patient certain preventive protocols, quality metrics, and best practice recommendations. A written personalized care plan for preventive services as well as general preventive health recommendations were provided to patient via mail.     OBrien-Blaney, Latissa Frick L, LPN  [id number]    I have reviewed the above information and agree with above.   Deborra Medina, MD

## 2019-10-30 NOTE — Patient Instructions (Addendum)
PLEASE RETURN IN [place] AND HAVE FASTING  LABS PRIOR TO YOUR VISIT  YOUR NEXT COLONOSCOPY IS DUE IN 2021 NEXT DEXA SCAN DUE IN 2021 YOU RECIEVED YOUR PNEUMONIA VACCINE TODAY  The ShingRx vaccine is now available in local pharmacies and is much more protective than the old one  Zostavax  (it is about 97%  Effective in preventing shingles). .   It is therefore ADVISED for all interested adults over 50 to prevent shingles so I have printed you a prescription for it.  (it requires a 2nd dose 2 to 6 months after the first one) .  It will cause you to have flu  like symptoms for 2 days If your pharmacy is not available to give it to you,  The [facility] pharmacy will give it to you.  They are located on the ground floor of the [place] [facility] After Age 69 After age 69, you are at a higher risk for certain long-term diseases and infections as well as injuries from falls. Falls are a major cause of broken bones and head injuries in people who are older than age 69. Getting regular preventive care can help to keep you healthy and well. Preventive care includes getting regular testing and making lifestyle changes as recommended by your health care provider. Talk with your health care provider about:  Which screenings and tests you should have. A screening is a test that checks for a disease when you have no symptoms.  A diet and exercise plan that is right for you. What should I know about screenings and tests to prevent falls? Screening and testing are the best ways to find a health problem early. Early diagnosis and treatment give you the best chance of managing medical conditions that are common after age 69. Certain conditions and lifestyle choices may make you more likely to have a fall. Your health care provider may recommend:  Regular vision checks. Poor vision and conditions such as cataracts can make you more likely to have a fall. If you wear glasses, make sure  to get your prescription updated if your vision changes.  Medicine review. Work with your health care provider to regularly review all of the medicines you are taking, including over-the-counter medicines. Ask your health care provider about any side effects that may make you more likely to have a fall. Tell your health care provider if any medicines that you take make you feel dizzy or sleepy.  Osteoporosis screening. Osteoporosis is a condition that causes the bones to get weaker. This can make the bones weak and cause them to break more easily.  Blood pressure screening. Blood pressure changes and medicines to control blood pressure can make you feel dizzy.  Strength and balance checks. Your health care provider may recommend certain tests to check your strength and balance while standing, walking, or changing positions.  Foot health exam. Foot pain and numbness, as well as not wearing proper footwear, can make you more likely to have a fall.  Depression screening. You may be more likely to have a fall if you have a fear of falling, feel emotionally low, or feel unable to do activities that you used to do.  Alcohol use screening. Using too much alcohol can affect your balance and may make you more likely to have a fall. What actions can I take to lower my risk of falls? General instructions  Talk with your health care provider about your risks for  falling. Tell your health care provider if: ? You fall. Be sure to tell your health care provider about all falls, even ones that seem minor. ? You feel dizzy, sleepy, or off-balance.  Take over-the-counter and prescription medicines only as told by your health care provider. These include any supplements.  Eat a healthy diet and maintain a healthy weight. A healthy diet includes low-fat dairy products, low-fat (lean) meats, and fiber from whole grains, beans, and lots of fruits and vegetables. Home safety  Remove any tripping hazards, such as  rugs, cords, and clutter.  Install safety equipment such as grab bars in bathrooms and safety rails on stairs.  Keep rooms and walkways well-lit. Activity   Follow a regular exercise program to stay fit. This will help you maintain your balance. Ask your health care provider what types of exercise are appropriate for you.  If you need a cane or walker, use it as recommended by your health care provider.  Wear supportive shoes that have nonskid soles. Lifestyle  Do not drink alcohol if your health care provider tells you not to drink.  If you drink alcohol, limit how much you have: ? 0-1 drink a day for women. ? 0-2 drinks a day for men.  Be aware of how much alcohol is in your drink. In the [place] one drink equals one typical bottle of beer (12 oz), one-half glass of wine (5 oz), or one shot of hard liquor (1 oz).  Do not use any products that contain nicotine or tobacco, such as cigarettes and e-cigarettes. If you need help quitting, ask your health care provider. Summary  Having a healthy lifestyle and getting preventive care can help to protect your health and wellness after age 20.  Screening and testing are the best way to find a health problem early and help you avoid having a fall. Early diagnosis and treatment give you the best chance for managing medical conditions that are more common for people who are older than age 69.  Falls are a major cause of broken bones and head injuries in people who are older than age 69. Take precautions to prevent a fall at home.  Work with your health care provider to learn what changes you can make to improve your health and wellness and to prevent falls. This information is not intended to replace advice given to you by your health care provider. Make sure you discuss any questions you have with your health care provider. Document Released: 10/24/2017 Document Revised: 04/03/2019 Document Reviewed: 10/24/2017 Elsevier Patient Education   2020 [employer].

## 2019-10-30 NOTE — Patient Instructions (Addendum)
  Martha [name] , Thank you for taking time to come for your Medicare Wellness Visit. I appreciate your ongoing commitment to your health goals. Please review the following plan we discussed and let me know if I can assist you in the future.   These are the goals we discussed: Goals    . Follow up with Primary Care Provider     As needed       This is a list of the screening recommended for you and due dates:  Health Maintenance  Topic Date Due  . Pneumonia vaccines (2 of 2 - PPSV23) 08/25/2018  . Eye exam for diabetics  04/02/2019  . Colon Cancer Screening  07/27/2020*  . Hemoglobin A1C  01/16/2020  . Complete foot exam   10/12/2020  . Mammogram  09/23/2021  . Tetanus Vaccine  10/09/2029  . Flu Shot  Completed  . DEXA scan (bone density measurement)  Completed  *Topic was postponed. The date shown is not the original due date.

## 2019-10-30 NOTE — Progress Notes (Signed)
Patient ID: Martha [name], female    DOB: 06-10-1951  Age: 68 y.o. MRN: [id number]  The patient is here for annual PHYSICAL examination and management of other chronic and acute problems.  [place] NEGATIVE 2018. NEEDS COLONSCOPY 2021  DEXA 2016  NORMAL  R LUMPECTOMY 2017: MAMMOGRAM BY HEME ONC SEPT 2020   The risk factors are reflected in the social history.  The roster of all physicians providing medical care to patient - is listed in the Snapshot section of the chart.  Activities of daily living:  The patient is 100% independent in all ADLs: dressing, toileting, feeding as well as independent mobility  Home safety : The patient has smoke detectors in the home. They wear seatbelts.  There are no firearms at home. There is no violence in the home.   There is no risks for hepatitis, STDs or HIV. There is no   history of blood transfusion. They have no travel history to infectious disease endemic areas of the world.  The patient has seen their dentist in the last six month. They have seen their eye doctor in the last year. They admit to slight hearing difficulty with regard to whispered voices and some television programs.  They have deferred audiologic testing in the last year.  They do not  have excessive sun exposure. Discussed the need for sun protection: hats, long sleeves and use of sunscreen if there is significant sun exposure.   Diet: the importance of a healthy diet is discussed. They do have a healthy diet.  The benefits of regular aerobic exercise were discussed. She walks 4 times per week ,  20 minutes.   Depression screen: there are no signs or vegative symptoms of depression- irritability, change in appetite, anhedonia, sadness/tearfullness.  Cognitive assessment: the patient manages all their financial and personal affairs and is actively engaged. They could relate day,date,year and events; recalled 2/3 objects at 3 minutes; performed clock-face test normally.  The  following portions of the patient's history were reviewed and updated as appropriate: allergies, current medications, past family history, past medical history,  past surgical history, past social history  and problem list.  Visual acuity was not assessed per patient preference since she has regular follow up with her ophthalmologist. Hearing and body mass index were assessed and reviewed.   During the course of the visit the patient was educated and counseled about appropriate screening and preventive services including : fall prevention , diabetes screening, nutrition counseling, colorectal cancer screening, and recommended immunizations.    CC: The primary encounter diagnosis was Controlled type 2 diabetes mellitus without complication, without long-term current use of insulin ([place]). Diagnoses of Essential hypertension, benign, Controlled type 2 diabetes mellitus with diabetic nephropathy, without long-term current use of insulin ([facility]), Class 2 severe obesity due to excess calories with serious comorbidity and body mass index (BMI) of 36.0 to 36.9 in adult [facility]), Pure hypercholesterolemia, and Visit for preventive health examination were also pertinent to this visit.  INGROWN TOENAIL SENT TO PODIATRY BY LG.  RIGHT THUMB PAIN   RIGHT SHIN CRAMP  Obesity:  Having trouble losing weight. 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   She has a  past surgical history that includes Dilation and curettage of uterus; Colonoscopy (2008); Breast lumpectomy (Right, 11/20/2016); Breast biopsy  (Right, 11/02/2016); Breast excisional biopsy (Right, 11/20/2016); and Radioactive seed guided mastectomy with axillary sentinel lymph node biopsy (Right, 05/16/2017).   Her family history includes Bone cancer in her brother; Breast cancer in her maternal aunt; Cancer (age of onset: 79) in her maternal aunt; Heart disease (age of onset: 90) in her father; Hypertension in her daughter and mother; Lymphoma in her maternal aunt; Pancreatic cancer in her maternal aunt.She reports that she quit smoking about 29 years ago. Her smoking use included cigarettes. She has a 1.25 pack-year smoking history. She has never used smokeless tobacco. She reports current alcohol use. She reports that she does not use drugs.  Outpatient Medications Prior to Visit  Medication Sig Dispense Refill  . anastrozole (ARIMIDEX) 1 MG tablet TAKE 1 TABLET BY MOUTH EVERY DAY 90 tablet 3  . aspirin 81 MG tablet Take 81 mg by mouth daily.    . cyclobenzaprine (FLEXERIL) 10 MG tablet TAKE 1 TABLET BY MOUTH 3  TIMES DAILY AS NEEDED FOR  MUSCLE SPASM(S) 90 tablet 1  . fluticasone (FLONASE) 50 MCG/ACT nasal spray Place 2 sprays into both nostrils daily. 16 g 2  . glucose blood test strip Use daily to test blood sugar E11.9 100 each 12  . meloxicam (MOBIC) 15 MG tablet TAKE 1 TABLET BY MOUTH EVERY DAY 30 tablet 0  . meloxicam (MOBIC) 15 MG tablet TAKE 1 TABLET BY MOUTH  DAILY 90 tablet 1  . metFORMIN (GLUCOPHAGE) 500 MG tablet TAKE 1 TABLET BY MOUTH TWO  TIMES DAILY WITH MEALS 180 tablet 3  . non-metallic deodorant (ALRA) MISC Apply 1 application topically.    . simvastatin (ZOCOR) 40 MG tablet TAKE 1 TABLET BY MOUTH  DAILY 90 tablet 3  . telmisartan-hydrochlorothiazide (MICARDIS HCT) 80-25 MG tablet Take 1 tablet by mouth every evening. 90 tablet 1  . triamcinolone cream (KENALOG) 0.1 % Apply topically 2 (two) times daily. For itchy rash 30 g 1   No facility-administered medications prior to visit.     Review of Systems   Patient  denies headache, fevers, malaise, unintentional weight loss, skin rash, eye pain, sinus congestion and sinus pain, sore throat, dysphagia,  hemoptysis , cough, dyspnea, wheezing, chest pain, palpitations, orthopnea, edema, abdominal pain, nausea, melena, diarrhea, constipation, flank pain, dysuria, hematuria, urinary  Frequency, nocturia, numbness, tingling, seizures,  Focal weakness, Loss of consciousness,  Tremor, insomnia, depression, anxiety, and suicidal ideation.      Objective:  BP 120/80   Pulse 86   Temp (!) 97.2 F (36.2 C) (Temporal)   Ht 5' 2.5" (1.588 m)   Wt 212 lb 6.4 oz (96.3 kg)   SpO2 96%   BMI 38.23 kg/m   Physical Exam  General appearance: alert, cooperative and appears stated age Ears: normal TM's and external ear canals both ears Throat: lips, mucosa, and tongue normal; teeth and gums normal Neck: no adenopathy, no carotid bruit, supple, symmetrical, trachea midline and thyroid not enlarged, symmetric, no tenderness/mass/nodules Back: symmetric, no curvature. ROM normal. No CVA tenderness. Lungs: clear to auscultation bilaterally Heart: regular rate and rhythm, S1, S2 normal, no murmur, click, rub or gallop Abdomen: soft, non-tender; bowel sounds normal; no masses,  no organomegaly Pulses: 2+ and symmetric Skin: Skin color, texture, turgor normal. No rashes or lesions Lymph nodes: Cervical, supraclavicular, and axillary nodes normal.   Assessment & Plan:   Problem List Items  Addressed This Visit      Unprioritized   Controlled type 2 diabetes mellitus with diabetic nephropathy, without long-term current use of insulin ([place]) - Primary     Historically well-controlled on diet alone .  hemoglobin A1c has been consistently at or  less than 7.0 . Patient is up-to-date on eye exams and foot exam is normal today. Patient has no microalbuminuria. Patient is tolerating statin therapy for CAD risk reduction and on ACE/ARB for renal protection and hypertension   Lab  Results  Component Value Date   HGBA1C 6.7 (H) 07/16/2019   Lab Results  Component Value Date   MICROALBUR 1.1 10/28/2018         Hyperlipidemia    .LDL and triglycerides are at goal on simvastatin . SHE has no side effects and liver enzymes are normal. No changes today   Lab Results  Component Value Date   CHOL 151 07/16/2019   HDL 57.60 07/16/2019   LDLCALC 72 07/16/2019   LDLDIRECT 71.0 09/14/2017   TRIG 106.0 07/16/2019   CHOLHDL 3 07/16/2019   Lab Results  Component Value Date   ALT 9 07/16/2019   AST 13 07/16/2019   ALKPHOS 112 07/16/2019   BILITOT 0.4 07/16/2019         Essential hypertension, benign    Well controlled on current regimen. Renal function stable, no changes today.      Class 2 severe obesity due to excess calories with serious comorbidity and body mass index (BMI) of 36.0 to 36.9 in adult [facility])   Visit for preventive health examination    age appropriate education and counseling updated, referrals for preventative services and immunizations addressed, dietary and smoking counseling addressed, most recent labs reviewed.  I have personally reviewed and have noted:  1) the patient's medical and social history 2) The pt's use of alcohol, tobacco, and illicit drugs 3) The patient's current medications and supplements 4) Functional ability including ADL's, fall risk, home safety risk, hearing and visual impairment 5) Diet and physical activities 6) Evidence for depression or mood disorder 7) The patient's height, weight, and BMI have been recorded in the chart  I have made referrals, and provided counseling and education based on review of the above         I am having Shalaunda M. Rubin start on traZODone and Zoster Vaccine Adjuvanted. I am also having her maintain her aspirin, fluticasone, triamcinolone cream, non-metallic deodorant, meloxicam, glucose blood, anastrozole, telmisartan-hydrochlorothiazide, meloxicam, cyclobenzaprine,  simvastatin, and metFORMIN.  Meds ordered this encounter  Medications  . traZODone (DESYREL) 50 MG tablet    Sig: Take 0.5-1 tablets (25-50 mg total) by mouth at bedtime as needed for sleep.    Dispense:  30 tablet    Refill:  3  . Zoster Vaccine Adjuvanted [facility]) injection    Sig: Inject 0.5 mLs into the muscle once for 1 dose.    Dispense:  1 each    Refill:  1    There are no discontinued medications.  Follow-up: Return in about 3 months (around 01/30/2020) for follow up diabetes.   [name], MD

## 2019-10-31 DIAGNOSIS — E119 Type 2 diabetes mellitus without complications: Secondary | ICD-10-CM | POA: Diagnosis not present

## 2019-10-31 LAB — HM DIABETES EYE EXAM

## 2019-11-01 DIAGNOSIS — Z0000 Encounter for general adult medical examination without abnormal findings: Secondary | ICD-10-CM | POA: Insufficient documentation

## 2019-11-01 NOTE — Assessment & Plan Note (Signed)
Historically well-controlled on diet alone .  hemoglobin A1c has been consistently at or  less than 7.0 . Patient is up-to-date on eye exams and foot exam is normal today. Patient has no microalbuminuria. Patient is tolerating statin therapy for CAD risk reduction and on ACE/ARB for renal protection and hypertension   Lab Results  Component Value Date   HGBA1C 6.7 (H) 07/16/2019   Lab Results  Component Value Date   MICROALBUR 1.1 10/28/2018    

## 2019-11-01 NOTE — Assessment & Plan Note (Signed)
.  LDL and triglycerides are at goal on simvastatin . SHE has no side effects and liver enzymes are normal. No changes today   Lab Results  Component Value Date   CHOL 151 07/16/2019   HDL 57.60 07/16/2019   LDLCALC 72 07/16/2019   LDLDIRECT 71.0 09/14/2017   TRIG 106.0 07/16/2019   CHOLHDL 3 07/16/2019   Lab Results  Component Value Date   ALT 9 07/16/2019   AST 13 07/16/2019   ALKPHOS 112 07/16/2019   BILITOT 0.4 07/16/2019    

## 2019-11-01 NOTE — Assessment & Plan Note (Signed)

## 2019-11-01 NOTE — Assessment & Plan Note (Signed)
Well controlled on current regimen. Renal function stable, no changes today. 

## 2019-11-03 MED ORDER — METFORMIN HCL 500 MG PO TABS: ORAL_TABLET | ORAL | 0 refills | Status: DC

## 2019-11-10 ENCOUNTER — Other Ambulatory Visit: Payer: Self-pay | Admitting: Internal Medicine

## 2019-11-11 ENCOUNTER — Encounter: Payer: Self-pay | Admitting: Internal Medicine

## 2019-11-12 ENCOUNTER — Other Ambulatory Visit: Payer: Self-pay | Admitting: Internal Medicine

## 2019-11-25 ENCOUNTER — Other Ambulatory Visit: Payer: Self-pay | Admitting: Internal Medicine

## 2020-01-29 NOTE — Progress Notes (Signed)
Patient Care Team: [name], MD as PCP - General (Internal Medicine) [name], MD (General Surgery) [name], MD as Consulting Physician (Hematology and Oncology) Delice Bison, Charlestine Massed, NP as Nurse Practitioner (Hematology and Oncology) Kyung Rudd, MD as Consulting Physician (Radiation Oncology) [name], MD as Consulting Physician (General Surgery)  DIAGNOSIS:    ICD-10-CM   1. Malignant neoplasm of upper-outer quadrant of right breast in female, estrogen receptor positive ([place])  C50.411 anastrozole (ARIMIDEX) 1 MG tablet   Z17.0     SUMMARY OF ONCOLOGIC HISTORY: Oncology History  Malignant neoplasm of upper-outer quadrant of right breast in female, estrogen receptor positive ([place])  04/09/2017 Mammogram   Right breast mass 9:00 position 1 cm from nipple: 4 mm; small asymmetry in the upper outer quadrant measuring 1 cm   04/20/2017 Initial Diagnosis   Right breast stereotactic biopsy: IDC with DCIS, biopsy 9:00 position: [facility], ER 100%, PR 100% HER-2 pending   05/16/2017 Surgery   Right lumpectomy: IDC grade 1, microscopic focus, low-grade DCIS, 0/3 lymph nodes negative, Atypical lobular hyperplasia, ER 100%, PR 100%, HER-2 negative ratio 1.63, Ki-67 insufficient, T60mc, N0 stage IA   06/18/2017 - 07/17/2017 Radiation Therapy   Adjuvant radiation therapy   09/2017 -  Anti-estrogen oral therapy   Anastrozole 191mdaily     CHIEF COMPLIANT: Follow-up of right breast cancer on anastrozole therapy  INTERVAL HISTORY: DoZYIA KANEKOs a [age].o. with above-mentioned history of right breast cancer treated with lumpectomy, adjuvant radiation, and who is currently on anastrozole therapy. Mammogram on 09/24/19 showed no evidence of malignancy bilaterally. She presents to the clinic today for follow-up.  She feels a slight discomfort in the site of surgery but otherwise doing quite well.  Denies any problems tolerating anastrozole.  She has difficulty with  sleeping.  ALLERGIES:  has No Known Allergies.  MEDICATIONS:  Current Outpatient Medications  Medication Sig Dispense Refill  . anastrozole (ARIMIDEX) 1 MG tablet Take 1 tablet (1 mg total) by mouth daily. 90 tablet 3  . aspirin 81 MG tablet Take 81 mg by mouth daily.    . cyclobenzaprine (FLEXERIL) 10 MG tablet TAKE 1 TABLET BY MOUTH 3  TIMES DAILY AS NEEDED FOR  MUSCLE SPASM(S) 90 tablet 1  . fluticasone (FLONASE) 50 MCG/ACT nasal spray Place 2 sprays into both nostrils daily. 16 g 2  . glucose blood test strip Use daily to test blood sugar E11.9 100 each 12  . metFORMIN (GLUCOPHAGE) 500 MG tablet TAKE 1 TABLET BY MOUTH TWICE A DAY WITH MEALS 180 tablet 1  . non-metallic deodorant (ALRA) MISC Apply 1 application topically.    . simvastatin (ZOCOR) 40 MG tablet TAKE 1 TABLET BY MOUTH  DAILY 90 tablet 3  . telmisartan-hydrochlorothiazide (MICARDIS HCT) 80-25 MG tablet TAKE 1 TABLET BY MOUTH IN  THE EVENING 90 tablet 3  . traZODone (DESYREL) 50 MG tablet TAKE 0.5-1 TABLETS (25-50 MG TOTAL) BY MOUTH AT BEDTIME AS NEEDED FOR SLEEP. 90 tablet 2  . triamcinolone cream (KENALOG) 0.1 % Apply topically 2 (two) times daily. For itchy rash 30 g 1   No current facility-administered medications for this visit.    PHYSICAL EXAMINATION: ECOG PERFORMANCE STATUS: 1 - Symptomatic but completely ambulatory  Vitals:   01/30/20 1207  BP: 124/68  Pulse: 95  Resp: 18  Temp: 98.7 F (37.1 C)  SpO2: 100%   Filed Weights   01/30/20 1207  Weight: 220 lb 6.4 oz (100 kg)  BREAST: No palpable masses or nodules in either right or left breasts. No palpable axillary supraclavicular or infraclavicular adenopathy no breast tenderness or nipple discharge. (exam performed in the presence of a chaperone)  LABORATORY DATA:  I have reviewed the data as listed CMP Latest Ref Rng & Units 07/29/2019 07/16/2019 10/28/2018  Glucose 70 - 99 mg/dL 94 121(H) 116(H)  BUN 6 - 23 mg/dL 17 26(H) 19  Creatinine 0.40 - 1.20  mg/dL 0.99 1.24(H) 1.01  Sodium 135 - 145 mEq/L 138 139 139  Potassium 3.5 - 5.1 mEq/L 4.5 4.8 4.1  Chloride 96 - 112 mEq/L 101 104 103  CO2 19 - 32 mEq/L _0 Calcium 8.4 - 10.5 mg/dL 10.3 10.0 9.8  Total Protein 6.0 - 8.3 g/dL - 6.6 7.1  Total Bilirubin 0.2 - 1.2 mg/dL - 0.4 0.3  Alkaline Phos 39 - 117 U/L - 112 102  AST 0 - 37 U/L - 13 13  ALT 0 - 35 U/L - 9 8    Lab Results  Component Value Date   WBC 8.8 07/20/2016   HGB 12.1 07/20/2016   HCT 36.9 07/20/2016   MCV 91.7 07/20/2016   PLT 323.0 07/20/2016   NEUTROABS 6.0 07/20/2016    ASSESSMENT & PLAN:  Malignant neoplasm of upper-outer quadrant of right breast in female, estrogen receptor positive ([place]) Right breast mass 9:00 position 1 cm from nipple: 4 mm; small asymmetry in the upper outer quadrant measuring 1 cm 04/19/2017:Right breast stereotactic biopsy: IDC with DCIS, biopsy 9:00 position: [facility], ER 100%, PR 100% HER-2Neg 05/21/17:Right lumpectomy: IDC grade 1, microscopic focus, low-grade DCIS, 0/3 lymph nodes negative, Atypical lobular hyperplasia, ER 100%, PR 100%, HER-2 negative ratio 1.63, Ki-67 insufficient, T87mc, N0 stage IA  Adj XRT 06/18/17- 07/17/17 Current Treatment: Anastrozole 1 mg daily Anastrozole toxicities: 1.Hot flashes: Much improved 2.Myalgias: No longer an issue  Breast cancer surveillance: 1.  Mammogram 09/24/2019: Benign breast density category B 2.  Breast exam 01/30/2020: Benign, scar tissue is the same as previous years.  Breast lymphedema:  Much better  RTC in 1 year    No orders of the defined types were placed in this encounter.  The patient has a good understanding of the overall plan. she agrees with it. she will call with any problems that may develop before the next visit here.  Total time spent: 20 mins including face to face time and time spent for planning, charting and coordination of care  GNicholas Lose MD 01/30/2020  I, MCloyde ReamsDorshimer, am acting as scribe  for Dr. VNicholas Lose  I have reviewed the above documentation for accuracy and completeness, and I agree with the above.

## 2020-01-30 ENCOUNTER — Telehealth: Payer: Self-pay | Admitting: Hematology and Oncology

## 2020-01-30 ENCOUNTER — Other Ambulatory Visit: Payer: Self-pay

## 2020-01-30 ENCOUNTER — Inpatient Hospital Stay: Payer: Medicare Other | Attending: Hematology and Oncology | Admitting: Hematology and Oncology

## 2020-01-30 DIAGNOSIS — C50411 Malignant neoplasm of upper-outer quadrant of right female breast: Secondary | ICD-10-CM | POA: Insufficient documentation

## 2020-01-30 DIAGNOSIS — Z170 Estrogen receptor positive status [ER+]: Secondary | ICD-10-CM | POA: Insufficient documentation

## 2020-01-30 DIAGNOSIS — I890 Lymphedema, not elsewhere classified: Secondary | ICD-10-CM | POA: Insufficient documentation

## 2020-01-30 DIAGNOSIS — Z923 Personal history of irradiation: Secondary | ICD-10-CM | POA: Diagnosis not present

## 2020-01-30 MED ORDER — ANASTROZOLE 1 MG PO TABS: 1.0000 mg | ORAL_TABLET | Freq: Every day | ORAL | 3 refills | Status: DC

## 2020-01-30 NOTE — Assessment & Plan Note (Signed)
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  Mammogram 09/24/2019: Benign breast density category B 2.  Breast exam 01/30/2020: Benign  Breast lymphedema: Patient massages the breast.    RTC in 1 year

## 2020-01-30 NOTE — Telephone Encounter (Signed)
I talk with patient regarding schedule  

## 2020-02-06 ENCOUNTER — Ambulatory Visit: Payer: Medicare Other

## 2020-02-06 ENCOUNTER — Ambulatory Visit: Payer: Medicare Other | Attending: Internal Medicine

## 2020-02-06 DIAGNOSIS — Z23 Encounter for immunization: Secondary | ICD-10-CM | POA: Insufficient documentation

## 2020-03-02 ENCOUNTER — Ambulatory Visit: Payer: Medicare Other | Attending: Internal Medicine

## 2020-03-02 DIAGNOSIS — Z23 Encounter for immunization: Secondary | ICD-10-CM

## 2020-03-02 NOTE — Progress Notes (Signed)
   Covid-19 Vaccination Clinic  Name:  ROSAMUND SHONK    MRN: [id number] DOB: May 25, 1951  03/02/2020  Ms. Dimitriou was observed post Covid-19 immunization for 15 minutes without incident. She was provided with Vaccine Information Sheet and instruction to access the V-Safe system.   Ms. Stengle was instructed to call 911 with any severe reactions post vaccine: Marland Kitchen Difficulty breathing  . Swelling of face and throat  . A fast heartbeat  . A bad rash all over body  . Dizziness and weakness   Immunizations Administered    Name Date Dose VIS Date Route   Pfizer COVID-19 Vaccine 03/02/2020  1:08 PM 0.3 mL 12/05/2019 Intramuscular   Manufacturer: [place]   Lot: [id number]   [place]: [id number]

## 2020-05-13 ENCOUNTER — Ambulatory Visit (INDEPENDENT_AMBULATORY_CARE_PROVIDER_SITE_OTHER): Payer: Medicare Other | Admitting: Internal Medicine

## 2020-05-13 ENCOUNTER — Encounter: Payer: Self-pay | Admitting: Internal Medicine

## 2020-05-13 ENCOUNTER — Other Ambulatory Visit: Payer: Self-pay

## 2020-05-13 VITALS — BP 130/74 | HR 79 | Temp 96.8°F | Resp 16 | Ht 62.5 in | Wt 212.2 lb

## 2020-05-13 DIAGNOSIS — E1121 Type 2 diabetes mellitus with diabetic nephropathy: Secondary | ICD-10-CM | POA: Diagnosis not present

## 2020-05-13 DIAGNOSIS — R748 Abnormal levels of other serum enzymes: Secondary | ICD-10-CM

## 2020-05-13 DIAGNOSIS — E7800 Pure hypercholesterolemia, unspecified: Secondary | ICD-10-CM

## 2020-05-13 DIAGNOSIS — E66812 Obesity, class 2: Secondary | ICD-10-CM

## 2020-05-13 DIAGNOSIS — R21 Rash and other nonspecific skin eruption: Secondary | ICD-10-CM | POA: Diagnosis not present

## 2020-05-13 DIAGNOSIS — Z6836 Body mass index (BMI) 36.0-36.9, adult: Secondary | ICD-10-CM

## 2020-05-13 DIAGNOSIS — I10 Essential (primary) hypertension: Secondary | ICD-10-CM | POA: Diagnosis not present

## 2020-05-13 LAB — LIPID PANEL
Cholesterol: 176 mg/dL (ref 0–200)
HDL: 62.5 mg/dL
LDL Cholesterol: 93 mg/dL (ref 0–99)
NonHDL: 113.57
Total CHOL/HDL Ratio: 3
Triglycerides: 104 mg/dL (ref 0.0–149.0)
VLDL: 20.8 mg/dL (ref 0.0–40.0)

## 2020-05-13 LAB — COMPREHENSIVE METABOLIC PANEL
ALT: 10 U/L (ref 0–35)
AST: 13 U/L (ref 0–37)
Albumin: 4.3 g/dL (ref 3.5–5.2)
Alkaline Phosphatase: 142 U/L — ABNORMAL HIGH (ref 39–117)
BUN: 21 mg/dL (ref 6–23)
CO2: 31 mEq/L (ref 19–32)
Calcium: 10.3 mg/dL (ref 8.4–10.5)
Chloride: 102 mEq/L (ref 96–112)
Creatinine, Ser: 1.03 mg/dL (ref 0.40–1.20)
GFR: 64.11 mL/min (ref >60.00)
Glucose, Bld: 147 mg/dL — ABNORMAL HIGH (ref 70–99)
Potassium: 4.3 mEq/L (ref 3.5–5.1)
Sodium: 138 mEq/L (ref 135–145)
Total Bilirubin: 0.4 mg/dL (ref 0.2–1.2)
Total Protein: 7.2 g/dL (ref 6.0–8.3)

## 2020-05-13 LAB — HEMOGLOBIN A1C: Hgb A1c MFr Bld: 7 % — ABNORMAL HIGH (ref 4.6–6.5)

## 2020-05-13 LAB — MICROALBUMIN / CREATININE URINE RATIO
Creatinine,U: 122 mg/dL
Microalb Creat Ratio: 0.8 mg/g (ref 0.0–30.0)
Microalb, Ur: 1 mg/dL (ref 0.0–1.9)

## 2020-05-13 MED ORDER — NYSTATIN 100000 UNIT/GM EX POWD: 1.0000 "application " | Freq: Two times a day (BID) | CUTANEOUS | 3 refills | Status: DC

## 2020-05-13 NOTE — Patient Instructions (Signed)
Start using Gold Bond Medicated Powder with Zinc under your breasts daily NOW  If you develop redness and itching,  Replace with nystatin powder (sent to mail order) twice daily until the redness resolves,,  Then resume the gold bond powder   Your arms do not have eczema.  It is dry skin and a sunlight reaction   Use Cerave or Eucerin daily,  And use sunscreen when you are in the sun   I recommend [place]  .  THE 2ND DOSE OF PNEUMOVAX.    If your a1c is > 7.0, I will recommend metformin XR IN PLACE of metformin

## 2020-05-13 NOTE — Progress Notes (Signed)
Subjective:  Patient ID: Martha [name], female    DOB: 1951-11-13  Age: 69 y.o. MRN: [id number]  CC: The primary encounter diagnosis was Pure hypercholesterolemia. Diagnoses of Controlled type 2 diabetes mellitus with diabetic nephropathy, without long-term current use of insulin (HCC), Class 2 severe obesity due to excess calories with serious comorbidity and body mass index (BMI) of 36.0 to 36.9 in adult [facility]), Essential hypertension, benign, Localized maculopapular rash, and Elevated alkaline phosphatase level were also pertinent to this visit.  HPI [name] presents for follow up on type 2 DM, obesity and hypertension   This visit occurred during the SARS-CoV-2 public health emergency.  Safety protocols were in place, including screening questions prior to the visit, additional usage of staff PPE, and extensive cleaning of exam room while observing appropriate contact time as indicated for disinfecting solutions.    Patient has received both doses of the [place] 19 vaccine without complications.  Patient continues to mask when outside of the home except when walking in yard or at safe distances from others .  Patient denies any change in mood or development of unhealthy behaviors resuting from the pandemic's restriction of activities and socialization.      DM:  She feels generally well, but has gained weight due to inactivity over the last year.  She has  resumed walking for exercising as of this week and checking blood sugars once or twice weekly at variable times.  BS have been under 130  fasting and < 150 post prandially.  Denies any recent hypoglyemic events.  Taking metformin but has reduced it to once daily due to improvement in glycemic control.  She is following a carbohydrate modified diet 6 days per week. Denies numbness, burning and tingling of extremities. Appetite is good.    BRCA:  She had her One year follow up in Feb with oncology . She has had no recurrence and  is taking /tolerating  anastrozole   Hypertension: patient checks blood pressure twice weekly at home.  Readings have been for the most part < 140/80 at rest . Patient is following a reduced salt diet most days and is taking medications as prescribed  Skin rash:  She has developed a reaction to the sun on her exposed forearms that transiently develops wheals that are pruritic and resolve within 24 hours.      Outpatient Medications Prior to Visit  Medication Sig Dispense Refill  . anastrozole (ARIMIDEX) 1 MG tablet Take 1 tablet (1 mg total) by mouth daily. 90 tablet 3  . aspirin 81 MG tablet Take 81 mg by mouth daily.    . cyclobenzaprine (FLEXERIL) 10 MG tablet TAKE 1 TABLET BY MOUTH 3  TIMES DAILY AS NEEDED FOR  MUSCLE SPASM(S) 90 tablet 1  . fluticasone (FLONASE) 50 MCG/ACT nasal spray Place 2 sprays into both nostrils daily. 16 g 2  . glucose blood test strip Use daily to test blood sugar E11.9 100 each 12  . non-metallic deodorant (ALRA) MISC Apply 1 application topically.    . simvastatin (ZOCOR) 40 MG tablet TAKE 1 TABLET BY MOUTH  DAILY 90 tablet 3  . telmisartan-hydrochlorothiazide (MICARDIS HCT) 80-25 MG tablet TAKE 1 TABLET BY MOUTH IN  THE EVENING 90 tablet 3  . metFORMIN (GLUCOPHAGE) 500 MG tablet TAKE 1 TABLET BY MOUTH TWICE A DAY WITH MEALS 180 tablet 1  . triamcinolone cream (KENALOG) 0.1 % Apply topically 2 (two) times daily. For itchy rash 30 g 1  .  traZODone (DESYREL) 50 MG tablet TAKE 0.5-1 TABLETS (25-50 MG TOTAL) BY MOUTH AT BEDTIME AS NEEDED FOR SLEEP. (Patient not taking: Reported on 05/13/2020) 90 tablet 2   No facility-administered medications prior to visit.    Review of Systems;  Patient denies headache, fevers, malaise, unintentional weight loss, skin rash, eye pain, sinus congestion and sinus pain, sore throat, dysphagia,  hemoptysis , cough, dyspnea, wheezing, chest pain, palpitations, orthopnea, edema, abdominal pain, nausea, melena, diarrhea, constipation,  flank pain, dysuria, hematuria, urinary  Frequency, nocturia, numbness, tingling, seizures,  Focal weakness, Loss of consciousness,  Tremor, insomnia, depression, anxiety, and suicidal ideation.      Objective:  BP 130/74 (BP Location: Left Arm, Patient Position: Sitting, Cuff Size: Normal)   Pulse 79   Temp (!) 96.8 F (36 C) (Temporal)   Resp 16   Ht 5' 2.5" (1.588 m)   Wt 212 lb 3.2 oz (96.3 kg)   SpO2 97%   BMI 38.19 kg/m   BP Readings from Last 3 Encounters:  05/13/20 130/74  01/30/20 124/68  10/30/19 120/80    Wt Readings from Last 3 Encounters:  05/13/20 212 lb 3.2 oz (96.3 kg)  01/30/20 220 lb 6.4 oz (100 kg)  10/30/19 212 lb 6.4 oz (96.3 kg)    General appearance: alert, cooperative and appears stated age Ears: normal TM's and external ear canals both ears Throat: lips, mucosa, and tongue normal; teeth and gums normal Neck: no adenopathy, no carotid bruit, supple, symmetrical, trachea midline and thyroid not enlarged, symmetric, no tenderness/mass/nodules Back: symmetric, no curvature. ROM normal. No CVA tenderness. Lungs: clear to auscultation bilaterally Heart: regular rate and rhythm, S1, S2 normal, no murmur, click, rub or gallop Abdomen: soft, non-tender; bowel sounds normal; no masses,  no organomegaly Pulses: 2+ and symmetric Skin: Skin color, texture, turgor normal. No rashes or lesions Lymph nodes: Cervical, supraclavicular, and axillary nodes normal.  Lab Results  Component Value Date   HGBA1C 7.0 (H) 05/13/2020   HGBA1C 6.7 (H) 07/16/2019   HGBA1C 6.6 (H) 10/28/2018    Lab Results  Component Value Date   CREATININE 1.03 05/13/2020   CREATININE 0.99 07/29/2019   CREATININE 1.24 (H) 07/16/2019    Lab Results  Component Value Date   WBC 8.8 07/20/2016   HGB 12.1 07/20/2016   HCT 36.9 07/20/2016   PLT 323.0 07/20/2016   GLUCOSE 147 (H) 05/13/2020   CHOL 176 05/13/2020   TRIG 104.0 05/13/2020   HDL 62.50 05/13/2020   LDLDIRECT 71.0  09/14/2017   LDLCALC 93 05/13/2020   ALT 10 05/13/2020   AST 13 05/13/2020   NA 138 05/13/2020   K 4.3 05/13/2020   CL 102 05/13/2020   CREATININE 1.03 05/13/2020   BUN 21 05/13/2020   CO2 31 05/13/2020   TSH 1.20 06/07/2017   HGBA1C 7.0 (H) 05/13/2020   MICROALBUR 1.0 05/13/2020    MM DIAG BREAST TOMO BILATERAL  Result Date: 09/24/2019 CLINICAL DATA:  History of right breast cancer status post lumpectomy in 2017. EXAM: DIGITAL DIAGNOSTIC BILATERAL MAMMOGRAM WITH CAD AND TOMO COMPARISON:  Previous exam(s). ACR Breast Density Category b: There are scattered areas of fibroglandular density. FINDINGS: Stable lumpectomy changes are seen in the right breast. No suspicious mass or malignant type microcalcifications identified in either breast. Mammographic images were processed with CAD. IMPRESSION: No evidence of malignancy in either breast. RECOMMENDATION: Bilateral diagnostic mammogram in 1 year is recommended. I have discussed the findings and recommendations with the patient. If applicable, a reminder  letter will be sent to the patient regarding the next appointment. BI-RADS CATEGORY  2: Benign. Electronically Signed   By: Lillia Mountain M.D.   On: 09/24/2019 12:17    Assessment & Plan:   Problem List Items Addressed This Visit      Unprioritized   Class 2 severe obesity due to excess calories with serious comorbidity and body mass index (BMI) of 36.0 to 36.9 in adult [facility])    I have congratulated her in  Losing 8 lbs since February encouraged  Continued weight loss with goal of 10% of body weigh over the next 6 months using a low glycemic index diet and regular exercise a minimum of 5 days per week.        Relevant Medications   metFORMIN (GLUCOPHAGE XR) 750 MG 24 hr tablet   Controlled type 2 diabetes mellitus with diabetic nephropathy, without long-term current use of insulin (HCC)     Historically well-controlled on diet alone .  hemoglobin A1c has risen slightly and is now  7.0 .  Patient is up-to-date on eye exams and foot exam is normal today. Patient has microalbuminuria, is tolerating statin therapy for CAD risk reduction and ARB for renal protection and hypertension.  Will change metformin to metformin XR    Lab Results  Component Value Date   HGBA1C 7.0 (H) 05/13/2020   Lab Results  Component Value Date   MICROALBUR 1.0 05/13/2020         Relevant Medications   metFORMIN (GLUCOPHAGE XR) 750 MG 24 hr tablet   Other Relevant Orders   Hemoglobin A1c (Completed)   Comprehensive metabolic panel (Completed)   Microalbumin / creatinine urine ratio (Completed)   Elevated alkaline phosphatase level    New,  Mild.  Etiology unclear  Given no history of RUQ pain .  Will repeat in 2 weeks followed by  Fractionation if still elevated.       Essential hypertension, benign    Well controlled on current regimen. Renal function stable, no changes today.  Lab Results  Component Value Date   CREATININE 1.03 05/13/2020   Lab Results  Component Value Date   NA 138 05/13/2020   K 4.3 05/13/2020   CL 102 05/13/2020   CO2 31 05/13/2020         Hyperlipidemia - Primary   Relevant Orders   Lipid panel (Completed)   Localized maculopapular rash    Occurring on forearms after  sunlight exposure, suggesting polymorphous light eruption .  Will refill triamcinolone , Recommend use of Eucerin for xerosis and diligent use of  sunscreen for sun exposed areas  .  .        I provided  30 minutes of  face-to-face time during this encounter reviewing patient's current problems and past surgeries, labs and imaging studies, providing counseling on the above mentioned problems , and coordination  of care .  I have discontinued Kandiss Ihrig. Armentrout's metFORMIN. I am also having her start on nystatin and metFORMIN. Additionally, I am having her maintain her aspirin, fluticasone, non-metallic deodorant, glucose blood, cyclobenzaprine, simvastatin, telmisartan-hydrochlorothiazide,  traZODone, anastrozole, and triamcinolone cream.  Meds ordered this encounter  Medications  . nystatin (MYCOSTATIN/NYSTOP) powder    Sig: Apply 1 application topically 2 (two) times daily. To rash until resolved.    Dispense:  45 g    Refill:  3  . metFORMIN (GLUCOPHAGE XR) 750 MG 24 hr tablet    Sig: Take 1 tablet (750 mg total) by  mouth daily with breakfast.    Dispense:  90 tablet    Refill:  1  . triamcinolone cream (KENALOG) 0.1 %    Sig: Apply topically 2 (two) times daily. For itchy rash    Dispense:  45 g    Refill:  1    Medications Discontinued During This Encounter  Medication Reason  . metFORMIN (GLUCOPHAGE) 500 MG tablet   . triamcinolone cream (KENALOG) 0.1 % Reorder    Follow-up: Return in about 3 months (around 08/13/2020) for follow up diabetes.   [name], MD

## 2020-05-15 ENCOUNTER — Other Ambulatory Visit: Payer: Self-pay | Admitting: Internal Medicine

## 2020-05-15 ENCOUNTER — Telehealth: Payer: Self-pay | Admitting: Internal Medicine

## 2020-05-15 DIAGNOSIS — R748 Abnormal levels of other serum enzymes: Secondary | ICD-10-CM

## 2020-05-15 MED ORDER — TRIAMCINOLONE ACETONIDE 0.1 % EX CREA: TOPICAL_CREAM | Freq: Two times a day (BID) | CUTANEOUS | 1 refills | Status: DC

## 2020-05-15 MED ORDER — METFORMIN HCL ER 750 MG PO TB24: 750.0000 mg | ORAL_TABLET | Freq: Every day | ORAL | 1 refills | Status: DC

## 2020-05-15 NOTE — Assessment & Plan Note (Signed)
I have congratulated her in  Losing 8 lbs since February encouraged  Continued weight loss with goal of 10% of body weigh over the next 6 months using a low glycemic index diet and regular exercise a minimum of 5 days per week.

## 2020-05-15 NOTE — Telephone Encounter (Signed)
My Chart message sent

## 2020-05-15 NOTE — Assessment & Plan Note (Addendum)
Occurring on forearms after  sunlight exposure, suggesting polymorphous light eruption .  Will refill triamcinolone , Recommend use of Eucerin for xerosis and diligent use of  sunscreen for sun exposed areas  .  Marland Kitchen

## 2020-05-15 NOTE — Assessment & Plan Note (Addendum)
New,  Mild.  Etiology unclear  Given no history of RUQ pain .  Will repeat in 2 weeks followed by  Fractionation if still elevated.

## 2020-05-15 NOTE — Assessment & Plan Note (Signed)
Well controlled on current regimen. Renal function stable, no changes today.  Lab Results  Component Value Date   CREATININE 1.03 05/13/2020   Lab Results  Component Value Date   NA 138 05/13/2020   K 4.3 05/13/2020   CL 102 05/13/2020   CO2 31 05/13/2020

## 2020-05-15 NOTE — Assessment & Plan Note (Signed)
Historically well-controlled on diet alone .  hemoglobin A1c has risen slightly and is now  7.0 . Patient is up-to-date on eye exams and foot exam is normal today. Patient has microalbuminuria, is tolerating statin therapy for CAD risk reduction and ARB for renal protection and hypertension.  Will change metformin to metformin XR    Lab Results  Component Value Date   HGBA1C 7.0 (H) 05/13/2020   Lab Results  Component Value Date   MICROALBUR 1.0 05/13/2020

## 2020-06-02 ENCOUNTER — Other Ambulatory Visit: Payer: Self-pay | Admitting: Internal Medicine

## 2020-06-02 DIAGNOSIS — E1121 Type 2 diabetes mellitus with diabetic nephropathy: Secondary | ICD-10-CM

## 2020-07-29 ENCOUNTER — Other Ambulatory Visit: Payer: Self-pay | Admitting: Internal Medicine

## 2020-08-13 ENCOUNTER — Other Ambulatory Visit: Payer: Self-pay

## 2020-08-13 ENCOUNTER — Ambulatory Visit (INDEPENDENT_AMBULATORY_CARE_PROVIDER_SITE_OTHER): Payer: Medicare Other | Admitting: Internal Medicine

## 2020-08-13 ENCOUNTER — Encounter: Payer: Self-pay | Admitting: Internal Medicine

## 2020-08-13 VITALS — BP 122/76 | HR 94 | Temp 98.3°F | Resp 14 | Ht 62.5 in | Wt 204.8 lb

## 2020-08-13 DIAGNOSIS — E7800 Pure hypercholesterolemia, unspecified: Secondary | ICD-10-CM | POA: Diagnosis not present

## 2020-08-13 DIAGNOSIS — I739 Peripheral vascular disease, unspecified: Secondary | ICD-10-CM

## 2020-08-13 DIAGNOSIS — Z1211 Encounter for screening for malignant neoplasm of colon: Secondary | ICD-10-CM

## 2020-08-13 DIAGNOSIS — R944 Abnormal results of kidney function studies: Secondary | ICD-10-CM

## 2020-08-13 DIAGNOSIS — Z6836 Body mass index (BMI) 36.0-36.9, adult: Secondary | ICD-10-CM

## 2020-08-13 DIAGNOSIS — I10 Essential (primary) hypertension: Secondary | ICD-10-CM

## 2020-08-13 DIAGNOSIS — E1121 Type 2 diabetes mellitus with diabetic nephropathy: Secondary | ICD-10-CM | POA: Diagnosis not present

## 2020-08-13 LAB — COMPREHENSIVE METABOLIC PANEL
ALT: 9 U/L (ref 0–35)
AST: 15 U/L (ref 0–37)
Albumin: 4.3 g/dL (ref 3.5–5.2)
Alkaline Phosphatase: 139 U/L — ABNORMAL HIGH (ref 39–117)
BUN: 16 mg/dL (ref 6–23)
CO2: 29 mEq/L (ref 19–32)
Calcium: 10.4 mg/dL (ref 8.4–10.5)
Chloride: 102 mEq/L (ref 96–112)
Creatinine, Ser: 1.16 mg/dL (ref 0.40–1.20)
GFR: 55.85 mL/min — ABNORMAL LOW (ref >60.00)
Glucose, Bld: 131 mg/dL — ABNORMAL HIGH (ref 70–99)
Potassium: 4.3 mEq/L (ref 3.5–5.1)
Sodium: 140 mEq/L (ref 135–145)
Total Bilirubin: 0.5 mg/dL (ref 0.2–1.2)
Total Protein: 7.2 g/dL (ref 6.0–8.3)

## 2020-08-13 LAB — LIPID PANEL
Cholesterol: 140 mg/dL (ref 0–200)
HDL: 54.4 mg/dL (ref >39.00)
LDL Cholesterol: 66 mg/dL (ref 0–99)
NonHDL: 85.87
Total CHOL/HDL Ratio: 3
Triglycerides: 98 mg/dL (ref 0.0–149.0)
VLDL: 19.6 mg/dL (ref 0.0–40.0)

## 2020-08-13 LAB — TSH: TSH: 1.43 u[IU]/mL (ref 0.35–4.50)

## 2020-08-13 LAB — HEMOGLOBIN A1C: Hgb A1c MFr Bld: 6.9 % — ABNORMAL HIGH (ref 4.6–6.5)

## 2020-08-13 MED ORDER — ZOSTER VAC RECOMB ADJUVANTED 50 MCG/0.5ML IM SUSR
0.5000 mL | Freq: Once | INTRAMUSCULAR | 1 refills | Status: AC
Start: 2020-08-13 — End: 2020-08-13

## 2020-08-13 MED ORDER — GLUCOSE BLOOD VI STRP: ORAL_STRIP | 12 refills | Status: DC

## 2020-08-13 MED ORDER — SPIRONOLACTONE 25 MG PO TABS: 25.0000 mg | ORAL_TABLET | Freq: Every day | ORAL | 3 refills | Status: DC

## 2020-08-13 NOTE — Progress Notes (Signed)
Subjective:  Patient ID: Martha [name], female    DOB: 08-11-1951  Age: 69 y.o. MRN: [id number]  CC: The primary encounter diagnosis was Colon cancer screening. A diagnosis of Controlled type 2 diabetes mellitus with diabetic nephropathy, without long-term current use of insulin (HCC) was also pertinent to this visit.  HPI Martha [name] presents for 3 month follow up on type 2 DM obeisty and hypertension  Weight loss.  From cutting out red meat and tequila  Eating more begetables   T2DM:  No hypoglycemic events.   [facility] sent a RN home visit and ABIs were done using a pulse oximeter?  0.81 on left ,  1.08 on right  No claudicatio symptosn when using the stationery bike and ellipitiglider    Outpatient Medications Prior to Visit  Medication Sig Dispense Refill  . anastrozole (ARIMIDEX) 1 MG tablet Take 1 tablet (1 mg total) by mouth daily. 90 tablet 3  . aspirin 81 MG tablet Take 81 mg by mouth daily.    . cyclobenzaprine (FLEXERIL) 10 MG tablet TAKE 1 TABLET BY MOUTH 3  TIMES DAILY AS NEEDED FOR  MUSCLE SPASM(S) 90 tablet 1  . fluticasone (FLONASE) 50 MCG/ACT nasal spray Place 2 sprays into both nostrils daily. 16 g 2  . metFORMIN (GLUCOPHAGE XR) 750 MG 24 hr tablet Take 1 tablet (750 mg total) by mouth daily with breakfast. 90 tablet 1  . non-metallic deodorant (ALRA) MISC Apply 1 application topically.    . nystatin (MYCOSTATIN/NYSTOP) powder Apply 1 application topically 2 (two) times daily. To rash until resolved. 45 g 3  . simvastatin (ZOCOR) 40 MG tablet TAKE 1 TABLET BY MOUTH  DAILY 90 tablet 3  . telmisartan-hydrochlorothiazide (MICARDIS HCT) 80-25 MG tablet TAKE 1 TABLET BY MOUTH IN  THE EVENING 90 tablet 3  . traZODone (DESYREL) 50 MG tablet TAKE 0.5-1 TABLETS (25-50 MG TOTAL) BY MOUTH AT BEDTIME AS NEEDED FOR SLEEP. 90 tablet 2  . triamcinolone cream (KENALOG) 0.1 % Apply topically 2 (two) times daily. For itchy rash 45 g 1  . glucose blood test strip Use daily to test  blood sugar E11.9 100 each 12   No facility-administered medications prior to visit.    Review of Systems;  Patient denies headache, fevers, malaise, unintentional weight loss, skin rash, eye pain, sinus congestion and sinus pain, sore throat, dysphagia,  hemoptysis , cough, dyspnea, wheezing, chest pain, palpitations, orthopnea, edema, abdominal pain, nausea, melena, diarrhea, constipation, flank pain, dysuria, hematuria, urinary  Frequency, nocturia, numbness, tingling, seizures,  Focal weakness, Loss of consciousness,  Tremor, insomnia, depression, anxiety, and suicidal ideation.      Objective:  BP 122/76 (BP Location: Left Arm, Patient Position: Sitting, Cuff Size: Large)   Pulse 94   Temp 98.3 F (36.8 C) (Oral)   Resp 14   Ht 5' 2.5" (1.588 m)   Wt 204 lb 12.8 oz (92.9 kg)   SpO2 98%   BMI 36.86 kg/m   BP Readings from Last 3 Encounters:  08/13/20 122/76  05/13/20 130/74  01/30/20 124/68    Wt Readings from Last 3 Encounters:  08/13/20 204 lb 12.8 oz (92.9 kg)  05/13/20 212 lb 3.2 oz (96.3 kg)  01/30/20 220 lb 6.4 oz (100 kg)    General appearance: alert, cooperative and appears stated age Ears: normal TM's and external ear canals both ears Throat: lips, mucosa, and tongue normal; teeth and gums normal Neck: no adenopathy, no carotid bruit, supple, symmetrical, trachea midline and  thyroid not enlarged, symmetric, no tenderness/mass/nodules Back: symmetric, no curvature. ROM normal. No CVA tenderness. Lungs: clear to auscultation bilaterally Heart: regular rate and rhythm, S1, S2 normal, no murmur, click, rub or gallop Abdomen: soft, non-tender; bowel sounds normal; no masses,  no organomegaly Pulses: 2+ and symmetric Skin: Skin color, texture, turgor normal. No rashes or lesions Lymph nodes: Cervical, supraclavicular, and axillary nodes normal.  Lab Results  Component Value Date   HGBA1C 7.0 (H) 05/13/2020   HGBA1C 6.7 (H) 07/16/2019   HGBA1C 6.6 (H)  10/28/2018    Lab Results  Component Value Date   CREATININE 1.03 05/13/2020   CREATININE 0.99 07/29/2019   CREATININE 1.24 (H) 07/16/2019    Lab Results  Component Value Date   WBC 8.8 07/20/2016   HGB 12.1 07/20/2016   HCT 36.9 07/20/2016   PLT 323.0 07/20/2016   GLUCOSE 147 (H) 05/13/2020   CHOL 176 05/13/2020   TRIG 104.0 05/13/2020   HDL 62.50 05/13/2020   LDLDIRECT 71.0 09/14/2017   LDLCALC 93 05/13/2020   ALT 10 05/13/2020   AST 13 05/13/2020   NA 138 05/13/2020   K 4.3 05/13/2020   CL 102 05/13/2020   CREATININE 1.03 05/13/2020   BUN 21 05/13/2020   CO2 31 05/13/2020   TSH 1.20 06/07/2017   HGBA1C 7.0 (H) 05/13/2020   MICROALBUR 1.0 05/13/2020    MM DIAG BREAST TOMO BILATERAL  Result Date: 09/24/2019 CLINICAL DATA:  History of right breast cancer status post lumpectomy in 2017. EXAM: DIGITAL DIAGNOSTIC BILATERAL MAMMOGRAM WITH CAD AND TOMO COMPARISON:  Previous exam(s). ACR Breast Density Category b: There are scattered areas of fibroglandular density. FINDINGS: Stable lumpectomy changes are seen in the right breast. No suspicious mass or malignant type microcalcifications identified in either breast. Mammographic images were processed with CAD. IMPRESSION: No evidence of malignancy in either breast. RECOMMENDATION: Bilateral diagnostic mammogram in 1 year is recommended. I have discussed the findings and recommendations with the patient. If applicable, a reminder letter will be sent to the patient regarding the next appointment. BI-RADS CATEGORY  2: Benign. Electronically Signed   By: Lillia Mountain M.D.   On: 09/24/2019 12:17    Assessment & Plan:   Problem List Items Addressed This Visit      Unprioritized   Controlled type 2 diabetes mellitus with diabetic nephropathy, without long-term current use of insulin ([place])   Relevant Medications   glucose blood test strip    Other Visit Diagnoses    Colon cancer screening    -  Primary   Relevant Orders    Cologuard      I have changed Martha [name]. Martha [name]'s glucose blood. I am also having her maintain her aspirin, fluticasone, non-metallic deodorant, cyclobenzaprine, telmisartan-hydrochlorothiazide, traZODone, anastrozole, nystatin, metFORMIN, triamcinolone cream, and simvastatin.  Meds ordered this encounter  Medications  . glucose blood test strip    Sig: Use daily to test blood sugar E11.9 one touch verio    Dispense:  100 each    Refill:  12    Medications Discontinued During This Encounter  Medication Reason  . glucose blood test strip Reorder    Follow-up: No follow-ups on file.   [name], MD

## 2020-08-13 NOTE — Patient Instructions (Addendum)
Congratulations on the weight loss of 16 lbs!!!!!  All legumes (beans with a hard center) are great sources of protein and fiber   Try using Miralax for the constipation. It is tasteless and dissolves In any liquids  Adding spironolactone to help reduce the fluid retention in your right breast.   Use once daily as needed    Referral to have the circulation in your legs checked

## 2020-08-15 NOTE — Assessment & Plan Note (Signed)
.  LDL and triglycerides are at goal on simvastatin . SHE has no side effects and liver enzymes are normal except for mild elevation in alk phos . No changes today   Lab Results  Component Value Date   CHOL 140 08/13/2020   HDL 54.40 08/13/2020   LDLCALC 66 08/13/2020   LDLDIRECT 71.0 09/14/2017   TRIG 98.0 08/13/2020   CHOLHDL 3 08/13/2020   Lab Results  Component Value Date   ALT 9 08/13/2020   AST 15 08/13/2020   ALKPHOS 139 (H) 08/13/2020   BILITOT 0.5 08/13/2020

## 2020-08-15 NOTE — Assessment & Plan Note (Signed)
Well controlled on current regimen. Renal function stable, no changes today.  Lab Results  Component Value Date   CREATININE 1.16 08/13/2020   Lab Results  Component Value Date   NA 140 08/13/2020   K 4.3 08/13/2020   CL 102 08/13/2020   CO2 29 08/13/2020

## 2020-08-15 NOTE — Assessment & Plan Note (Signed)
I have congratulated her in reduction of   BMI and encouraged  Continued weight loss with goal of 10% of body weigh over the next 6 months using a low glycemic index diet and regular exercise a minimum of 5 days per week.    

## 2020-08-15 NOTE — Assessment & Plan Note (Addendum)
Historically well-controlled on diet alone .  hemoglobin A1c is below 7.0 . Patient is up-to-date on eye exams and foot exam is normal today. Patient has no microalbuminuria, is tolerating statin therapy for CAD risk reduction and ARB for renal protection and hypertension.  Tolerating metformin XR    Lab Results  Component Value Date   HGBA1C 6.9 (H) 08/13/2020   Lab Results  Component Value Date   MICROALBUR 1.0 05/13/2020

## 2020-08-15 NOTE — Progress Notes (Signed)
Subjective:  Patient ID: Martha [name], female    DOB: 1951/08/14  Age: 69 y.o. MRN: [id number]  CC: The primary encounter diagnosis was Colon cancer screening. Diagnoses of Controlled type 2 diabetes mellitus with diabetic nephropathy, without long-term current use of insulin ([place]), Pure hypercholesterolemia, Essential hypertension, benign, Peripheral vascular disease of lower extremity ([place]), and Class 2 severe obesity due to excess calories with serious comorbidity and body mass index (BMI) of 36.0 to 36.9 in adult [facility]) were also pertinent to this visit.  HPI PETRICE BEEDY presents for 3 month follow up on type 2 DM obesity and hypertension  This visit occurred during the SARS-CoV-2 public health emergency.  Safety protocols were in place, including screening questions prior to the visit, additional usage of staff PPE, and extensive cleaning of exam room while observing appropriate contact time as indicated for disinfecting solutions.   T2DM:  She feels generally well, has yet intentional weight loss.  From cutting out red meat and tequila cocktails. She has been eating more vegetables.   She  is exercising several times per week and checking blood sugars once daily at variable times.  BS have been under 130 fasting and < 150 post prandially.  Denies any recent hypoglyemic events.  Taking her medications as directed. Following a carbohydrate modified diet 7 days per week. Denies numbness, burning and tingling of extremities. Appetite is good.   [employer] sent a RN home visit and she was screened for PAD . ABIs were done using a pulse oximeter?  0.81 on left ,  1.08 on right  No claudication symptoms when using the stationery bike and ellipitiglider    Outpatient Medications Prior to Visit  Medication Sig Dispense Refill  . anastrozole (ARIMIDEX) 1 MG tablet Take 1 tablet (1 mg total) by mouth daily. 90 tablet 3  . aspirin 81 MG tablet Take 81 mg by mouth daily.    . cyclobenzaprine  (FLEXERIL) 10 MG tablet TAKE 1 TABLET BY MOUTH 3  TIMES DAILY AS NEEDED FOR  MUSCLE SPASM(S) 90 tablet 1  . fluticasone (FLONASE) 50 MCG/ACT nasal spray Place 2 sprays into both nostrils daily. 16 g 2  . metFORMIN (GLUCOPHAGE XR) 750 MG 24 hr tablet Take 1 tablet (750 mg total) by mouth daily with breakfast. 90 tablet 1  . non-metallic deodorant (ALRA) MISC Apply 1 application topically.    . nystatin (MYCOSTATIN/NYSTOP) powder Apply 1 application topically 2 (two) times daily. To rash until resolved. 45 g 3  . simvastatin (ZOCOR) 40 MG tablet TAKE 1 TABLET BY MOUTH  DAILY 90 tablet 3  . telmisartan-hydrochlorothiazide (MICARDIS HCT) 80-25 MG tablet TAKE 1 TABLET BY MOUTH IN  THE EVENING 90 tablet 3  . traZODone (DESYREL) 50 MG tablet TAKE 0.5-1 TABLETS (25-50 MG TOTAL) BY MOUTH AT BEDTIME AS NEEDED FOR SLEEP. 90 tablet 2  . triamcinolone cream (KENALOG) 0.1 % Apply topically 2 (two) times daily. For itchy rash 45 g 1  . glucose blood test strip Use daily to test blood sugar E11.9 100 each 12   No facility-administered medications prior to visit.    Review of Systems;  Patient denies headache, fevers, malaise, unintentional weight loss, skin rash, eye pain, sinus congestion and sinus pain, sore throat, dysphagia,  hemoptysis , cough, dyspnea, wheezing, chest pain, palpitations, orthopnea, edema, abdominal pain, nausea, melena, diarrhea, constipation, flank pain, dysuria, hematuria, urinary  Frequency, nocturia, numbness, tingling, seizures,  Focal weakness, Loss of consciousness,  Tremor, insomnia, depression, anxiety, and  suicidal ideation.      Objective:  BP 122/76 (BP Location: Left Arm, Patient Position: Sitting, Cuff Size: Large)   Pulse 94   Temp 98.3 F (36.8 C) (Oral)   Resp 14   Ht 5' 2.5" (1.588 m)   Wt 204 lb 12.8 oz (92.9 kg)   SpO2 98%   BMI 36.86 kg/m   BP Readings from Last 3 Encounters:  08/13/20 122/76  05/13/20 130/74  01/30/20 124/68    Wt Readings from Last  3 Encounters:  08/13/20 204 lb 12.8 oz (92.9 kg)  05/13/20 212 lb 3.2 oz (96.3 kg)  01/30/20 220 lb 6.4 oz (100 kg)    General appearance: alert, cooperative and appears stated age Ears: normal TM's and external ear canals both ears Throat: lips, mucosa, and tongue normal; teeth and gums normal Neck: no adenopathy, no carotid bruit, supple, symmetrical, trachea midline and thyroid not enlarged, symmetric, no tenderness/mass/nodules Back: symmetric, no curvature. ROM normal. No CVA tenderness. Lungs: clear to auscultation bilaterally Heart: regular rate and rhythm, S1, S2 normal, no murmur, click, rub or gallop Abdomen: soft, non-tender; bowel sounds normal; no masses,  no organomegaly Pulses: 2+ and symmetric Skin: Skin color, texture, turgor normal. No rashes or lesions Lymph nodes: Cervical, supraclavicular, and axillary nodes normal.  Lab Results  Component Value Date   HGBA1C 6.9 (H) 08/13/2020   HGBA1C 7.0 (H) 05/13/2020   HGBA1C 6.7 (H) 07/16/2019    Lab Results  Component Value Date   CREATININE 1.16 08/13/2020   CREATININE 1.03 05/13/2020   CREATININE 0.99 07/29/2019    Lab Results  Component Value Date   WBC 8.8 07/20/2016   HGB 12.1 07/20/2016   HCT 36.9 07/20/2016   PLT 323.0 07/20/2016   GLUCOSE 131 (H) 08/13/2020   CHOL 140 08/13/2020   TRIG 98.0 08/13/2020   HDL 54.40 08/13/2020   LDLDIRECT 71.0 09/14/2017   LDLCALC 66 08/13/2020   ALT 9 08/13/2020   AST 15 08/13/2020   NA 140 08/13/2020   K 4.3 08/13/2020   CL 102 08/13/2020   CREATININE 1.16 08/13/2020   BUN 16 08/13/2020   CO2 29 08/13/2020   TSH 1.43 08/13/2020   HGBA1C 6.9 (H) 08/13/2020   MICROALBUR 1.0 05/13/2020    MM DIAG BREAST TOMO BILATERAL  Result Date: 09/24/2019 CLINICAL DATA:  History of right breast cancer status post lumpectomy in 2017. EXAM: DIGITAL DIAGNOSTIC BILATERAL MAMMOGRAM WITH CAD AND TOMO COMPARISON:  Previous exam(s). ACR Breast Density Category b: There are  scattered areas of fibroglandular density. FINDINGS: Stable lumpectomy changes are seen in the right breast. No suspicious mass or malignant type microcalcifications identified in either breast. Mammographic images were processed with CAD. IMPRESSION: No evidence of malignancy in either breast. RECOMMENDATION: Bilateral diagnostic mammogram in 1 year is recommended. I have discussed the findings and recommendations with the patient. If applicable, a reminder letter will be sent to the patient regarding the next appointment. BI-RADS CATEGORY  2: Benign. Electronically Signed   By: Lillia Mountain M.D.   On: 09/24/2019 12:17    Assessment & Plan:   Problem List Items Addressed This Visit      Unprioritized   Class 2 severe obesity due to excess calories with serious comorbidity and body mass index (BMI) of 36.0 to 36.9 in adult [facility])    I have congratulated her in reduction of   BMI and encouraged  Continued weight loss with goal of 10% of body weigh over the next 6  months using a low glycemic index diet and regular exercise a minimum of 5 days per week.        Controlled type 2 diabetes mellitus with diabetic nephropathy, without long-term current use of insulin (HCC)     Historically well-controlled on diet alone .  hemoglobin A1c is below 7.0 . Patient is up-to-date on eye exams and foot exam is normal today. Patient has no microalbuminuria, is tolerating statin therapy for CAD risk reduction and ARB for renal protection and hypertension.  Tolerating metformin XR    Lab Results  Component Value Date   HGBA1C 6.9 (H) 08/13/2020   Lab Results  Component Value Date   MICROALBUR 1.0 05/13/2020         Relevant Medications   glucose blood test strip   Other Relevant Orders   Hemoglobin A1c (Completed)   Essential hypertension, benign    Well controlled on current regimen. Renal function stable, no changes today.  Lab Results  Component Value Date   CREATININE 1.16 08/13/2020   Lab  Results  Component Value Date   NA 140 08/13/2020   K 4.3 08/13/2020   CL 102 08/13/2020   CO2 29 08/13/2020         Relevant Medications   spironolactone (ALDACTONE) 25 MG tablet   Other Relevant Orders   Comprehensive metabolic panel (Completed)   Hyperlipidemia    .LDL and triglycerides are at goal on simvastatin . SHE has no side effects and liver enzymes are normal except for mild elevation in alk phos . No changes today   Lab Results  Component Value Date   CHOL 140 08/13/2020   HDL 54.40 08/13/2020   LDLCALC 66 08/13/2020   LDLDIRECT 71.0 09/14/2017   TRIG 98.0 08/13/2020   CHOLHDL 3 08/13/2020   Lab Results  Component Value Date   ALT 9 08/13/2020   AST 15 08/13/2020   ALKPHOS 139 (H) 08/13/2020   BILITOT 0.5 08/13/2020         Relevant Medications   spironolactone (ALDACTONE) 25 MG tablet   Other Relevant Orders   TSH (Completed)   Lipid panel (Completed)    Other Visit Diagnoses    Colon cancer screening    -  Primary   Relevant Orders   Cologuard   Peripheral vascular disease of lower extremity (HCC)       Relevant Medications   spironolactone (ALDACTONE) 25 MG tablet   Other Relevant Orders   Ambulatory referral to Vascular Surgery     I provided  30 minutes of  face-to-face time during this encounter reviewing patient's current problems and past surgeries, labs and imaging studies, providing counseling on the above mentioned problems , and coordination  of care .  I have changed Rogue Jury. Maher's glucose blood. I am also having her start on Zoster Vaccine Adjuvanted and spironolactone. Additionally, I am having her maintain her aspirin, fluticasone, non-metallic deodorant, cyclobenzaprine, telmisartan-hydrochlorothiazide, traZODone, anastrozole, nystatin, metFORMIN, triamcinolone cream, and simvastatin.  Meds ordered this encounter  Medications  . glucose blood test strip    Sig: Use daily to test blood sugar E11.9 one touch verio     Dispense:  100 each    Refill:  12  . Zoster Vaccine Adjuvanted [facility]) injection    Sig: Inject 0.5 mLs into the muscle once for 1 dose.    Dispense:  1 each    Refill:  1  . spironolactone (ALDACTONE) 25 MG tablet    Sig: Take 1 tablet (  25 mg total) by mouth daily.    Dispense:  30 tablet    Refill:  3    Medications Discontinued During This Encounter  Medication Reason  . glucose blood test strip Reorder    Follow-up: No follow-ups on file.   [name], MD

## 2020-08-17 NOTE — Assessment & Plan Note (Signed)
RTC when hydrated,  2 weeks  Lab Results  Component Value Date   CREATININE 1.16 08/13/2020

## 2020-08-18 ENCOUNTER — Other Ambulatory Visit: Payer: Self-pay | Admitting: Internal Medicine

## 2020-08-18 DIAGNOSIS — R944 Abnormal results of kidney function studies: Secondary | ICD-10-CM

## 2020-08-24 ENCOUNTER — Other Ambulatory Visit (INDEPENDENT_AMBULATORY_CARE_PROVIDER_SITE_OTHER): Payer: Medicare Other

## 2020-08-24 ENCOUNTER — Other Ambulatory Visit: Payer: Self-pay

## 2020-08-24 DIAGNOSIS — R944 Abnormal results of kidney function studies: Secondary | ICD-10-CM | POA: Diagnosis not present

## 2020-08-24 DIAGNOSIS — E1121 Type 2 diabetes mellitus with diabetic nephropathy: Secondary | ICD-10-CM

## 2020-08-24 LAB — BASIC METABOLIC PANEL
BUN: 20 mg/dL (ref 6–23)
CO2: 29 mEq/L (ref 19–32)
Calcium: 9.7 mg/dL (ref 8.4–10.5)
Chloride: 101 mEq/L (ref 96–112)
Creatinine, Ser: 1.08 mg/dL (ref 0.40–1.20)
GFR: 60.65 mL/min (ref >60.00)
Glucose, Bld: 129 mg/dL — ABNORMAL HIGH (ref 70–99)
Potassium: 4.5 mEq/L (ref 3.5–5.1)
Sodium: 138 mEq/L (ref 135–145)

## 2020-08-25 DIAGNOSIS — Z1211 Encounter for screening for malignant neoplasm of colon: Secondary | ICD-10-CM | POA: Diagnosis not present

## 2020-08-25 LAB — COLOGUARD: Cologuard: NEGATIVE

## 2020-09-02 ENCOUNTER — Other Ambulatory Visit: Payer: Self-pay | Admitting: General Surgery

## 2020-09-02 DIAGNOSIS — Z9889 Other specified postprocedural states: Secondary | ICD-10-CM

## 2020-09-02 LAB — COLOGUARD: COLOGUARD: NEGATIVE

## 2020-09-02 LAB — EXTERNAL GENERIC LAB PROCEDURE: COLOGUARD: NEGATIVE

## 2020-09-06 LAB — HM DIABETES EYE EXAM

## 2020-09-13 ENCOUNTER — Encounter (INDEPENDENT_AMBULATORY_CARE_PROVIDER_SITE_OTHER): Payer: Medicare Other | Admitting: Vascular Surgery

## 2020-09-29 ENCOUNTER — Ambulatory Visit
Admission: RE | Admit: 2020-09-29 | Discharge: 2020-09-29 | Disposition: A | Discharge status: Home / Self Care | Payer: Medicare Other | Source: Ambulatory Visit | Attending: General Surgery | Admitting: General Surgery

## 2020-09-29 ENCOUNTER — Other Ambulatory Visit: Payer: Self-pay

## 2020-09-29 DIAGNOSIS — R928 Other abnormal and inconclusive findings on diagnostic imaging of breast: Secondary | ICD-10-CM | POA: Diagnosis not present

## 2020-09-29 DIAGNOSIS — Z9889 Other specified postprocedural states: Secondary | ICD-10-CM

## 2020-09-29 DIAGNOSIS — Z853 Personal history of malignant neoplasm of breast: Secondary | ICD-10-CM | POA: Diagnosis not present

## 2020-09-30 ENCOUNTER — Other Ambulatory Visit: Payer: Self-pay | Admitting: Internal Medicine

## 2020-10-11 DIAGNOSIS — C50911 Malignant neoplasm of unspecified site of right female breast: Secondary | ICD-10-CM | POA: Diagnosis not present

## 2020-11-01 ENCOUNTER — Ambulatory Visit (INDEPENDENT_AMBULATORY_CARE_PROVIDER_SITE_OTHER): Payer: Medicare Other

## 2020-11-01 VITALS — Ht 62.5 in | Wt 198.5 lb

## 2020-11-01 DIAGNOSIS — Z0000 Encounter for general adult medical examination without abnormal findings: Secondary | ICD-10-CM | POA: Diagnosis not present

## 2020-11-01 NOTE — Patient Instructions (Addendum)
Martha [name] , Thank you for taking time to come for your Medicare Wellness Visit. I appreciate your ongoing commitment to your health goals. Please review the following plan we discussed and let me know if I can assist you in the future.   These are the goals we discussed: Goals    . Follow up with Primary Care Provider     As needed       This is a list of the screening recommended for you and due dates:  Health Maintenance  Topic Date Due  . Complete foot exam   10/12/2020  . Eye exam for diabetics  10/30/2020  . Pneumonia vaccines (2 of 2 - PPSV23) 12/06/2020*  . Hemoglobin A1C  02/13/2021  . Mammogram  09/29/2022  . Cologuard (Stool DNA test)  08/26/2023  . Tetanus Vaccine  10/30/2029  . Flu Shot  Completed  . DEXA scan (bone density measurement)  Completed  . COVID-19 Vaccine  Completed  *Topic was postponed. The date shown is not the original due date.    Immunizations Immunization History  Administered Date(s) Administered  . Fluad Quad(high Dose 65+) 09/11/2019  . Influenza Split 10/15/2014  . Influenza, High Dose Seasonal PF 10/03/2018  . Influenza,inj,Quad PF,6+ Mos 09/20/2016  . Influenza-Unspecified 10/24/2012, 10/12/2013, 10/01/2017  . PFIZER SARS-COV-2 Vaccination 02/06/2020, 03/02/2020  . Pneumococcal Conjugate-13 06/21/2015, 08/25/2017  . Pneumococcal Polysaccharide-23 10/24/2012  . Tdap 07/24/2009, 10/10/2019, 10/31/2019   Keep all routine maintenance appointments.   Follow up 12/06/20 @ 9:30   Advanced directives: End of life planning; Advance aging; Advanced directives discussed.  Copy of current HCPOA/Living Will requested.    Conditions/risks identified: none new.  Follow up in one year for your annual wellness visit.   Preventive Care 56 Years and Older, Female Preventive care refers to lifestyle choices and visits with your health care provider that can promote health and wellness. What does preventive care include?  A yearly physical exam.  This is also called an annual well check.  Dental exams once or twice a year.  Routine eye exams. Ask your health care provider how often you should have your eyes checked.  Personal lifestyle choices, including:  Daily care of your teeth and gums.  Regular physical activity.  Eating a healthy diet.  Avoiding tobacco and drug use.  Limiting alcohol use.  Practicing safe sex.  Taking low-dose aspirin every day.  Taking vitamin and mineral supplements as recommended by your health care provider. What happens during an annual well check? The services and screenings done by your health care provider during your annual well check will depend on your age, overall health, lifestyle risk factors, and family history of disease. Counseling  Your health care provider may ask you questions about your:  Alcohol use.  Tobacco use.  Drug use.  Emotional well-being.  Home and relationship well-being.  Sexual activity.  Eating habits.  History of falls.  Memory and ability to understand (cognition).  Work and work Statistician.  Reproductive health. Screening  You may have the following tests or measurements:  Height, weight, and BMI.  Blood pressure.  Lipid and cholesterol levels. These may be checked every 5 years, or more frequently if you are over 58 years old.  Skin check.  Lung cancer screening. You may have this screening every year starting at age 60 if you have a 30-pack-year history of smoking and currently smoke or have quit within the past 15 years.  Fecal occult blood test (FOBT) of the  stool. You may have this test every year starting at age 48.  Flexible sigmoidoscopy or colonoscopy. You may have a sigmoidoscopy every 5 years or a colonoscopy every 10 years starting at age 48.  Hepatitis C blood test.  Hepatitis B blood test.  Sexually transmitted disease (STD) testing.  Diabetes screening. This is done by checking your blood sugar (glucose) after  you have not eaten for a while (fasting). You may have this done every 1-3 years.  Bone density scan. This is done to screen for osteoporosis. You may have this done starting at age 7.  Mammogram. This may be done every 1-2 years. Talk to your health care provider about how often you should have regular mammograms. Talk with your health care provider about your test results, treatment options, and if necessary, the need for more tests. Vaccines  Your health care provider may recommend certain vaccines, such as:  Influenza vaccine. This is recommended every year.  Tetanus, diphtheria, and acellular pertussis (Tdap, Td) vaccine. You may need a Td booster every 10 years.  Zoster vaccine. You may need this after age 60.  Pneumococcal 13-valent conjugate (PCV13) vaccine. One dose is recommended after age 70.  Pneumococcal polysaccharide (PPSV23) vaccine. One dose is recommended after age 62. Talk to your health care provider about which screenings and vaccines you need and how often you need them. This information is not intended to replace advice given to you by your health care provider. Make sure you discuss any questions you have with your health care provider. Document Released: 01/07/2016 Document Revised: 08/30/2016 Document Reviewed: 10/12/2015 Elsevier Interactive Patient Education  2017 [place] Prevention in the Home Falls can cause injuries. They can happen to people of all ages. There are many things you can do to make your home safe and to help prevent falls. What can I do on the outside of my home?  Regularly fix the edges of walkways and driveways and fix any cracks.  Remove anything that might make you trip as you walk through a door, such as a raised step or threshold.  Trim any bushes or trees on the path to your home.  Use bright outdoor lighting.  Clear any walking paths of anything that might make someone trip, such as rocks or tools.  Regularly  check to see if handrails are loose or broken. Make sure that both sides of any steps have handrails.  Any raised decks and porches should have guardrails on the edges.  Have any leaves, snow, or ice cleared regularly.  Use sand or salt on walking paths during winter.  Clean up any spills in your garage right away. This includes oil or grease spills. What can I do in the bathroom?  Use night lights.  Install grab bars by the toilet and in the tub and shower. Do not use towel bars as grab bars.  Use non-skid mats or decals in the tub or shower.  If you need to sit down in the shower, use a plastic, non-slip stool.  Keep the floor dry. Clean up any water that spills on the floor as soon as it happens.  Remove soap buildup in the tub or shower regularly.  Attach bath mats securely with double-sided non-slip rug tape.  Do not have throw rugs and other things on the floor that can make you trip. What can I do in the bedroom?  Use night lights.  Make sure that you have a light by your bed  that is easy to reach.  Do not use any sheets or blankets that are too big for your bed. They should not hang down onto the floor.  Have a firm chair that has side arms. You can use this for support while you get dressed.  Do not have throw rugs and other things on the floor that can make you trip. What can I do in the kitchen?  Clean up any spills right away.  Avoid walking on wet floors.  Keep items that you use a lot in easy-to-reach places.  If you need to reach something above you, use a strong step stool that has a grab bar.  Keep electrical cords out of the way.  Do not use floor polish or wax that makes floors slippery. If you must use wax, use non-skid floor wax.  Do not have throw rugs and other things on the floor that can make you trip. What can I do with my stairs?  Do not leave any items on the stairs.  Make sure that there are handrails on both sides of the stairs and  use them. Fix handrails that are broken or loose. Make sure that handrails are as long as the stairways.  Check any carpeting to make sure that it is firmly attached to the stairs. Fix any carpet that is loose or worn.  Avoid having throw rugs at the top or bottom of the stairs. If you do have throw rugs, attach them to the floor with carpet tape.  Make sure that you have a light switch at the top of the stairs and the bottom of the stairs. If you do not have them, ask someone to add them for you. What else can I do to help prevent falls?  Wear shoes that:  Do not have high heels.  Have rubber bottoms.  Are comfortable and fit you well.  Are closed at the toe. Do not wear sandals.  If you use a stepladder:  Make sure that it is fully opened. Do not climb a closed stepladder.  Make sure that both sides of the stepladder are locked into place.  Ask someone to hold it for you, if possible.  Clearly mark and make sure that you can see:  Any grab bars or handrails.  First and last steps.  Where the edge of each step is.  Use tools that help you move around (mobility aids) if they are needed. These include:  Canes.  Walkers.  Scooters.  Crutches.  Turn on the lights when you go into a dark area. Replace any light bulbs as soon as they burn out.  Set up your furniture so you have a clear path. Avoid moving your furniture around.  If any of your floors are uneven, fix them.  If there are any pets around you, be aware of where they are.  Review your medicines with your doctor. Some medicines can make you feel dizzy. This can increase your chance of falling. Ask your doctor what other things that you can do to help prevent falls. This information is not intended to replace advice given to you by your health care provider. Make sure you discuss any questions you have with your health care provider. Document Released: 10/07/2009 Document Revised: 05/18/2016 Document  Reviewed: 01/15/2015 Elsevier Interactive Patient Education  2017 [employer].

## 2020-11-01 NOTE — Progress Notes (Addendum)
Subjective:   Martha [name] is a 68 y.o. female who presents for Medicare Annual (Subsequent) preventive examination.  Review of Systems    No ROS.  Medicare Wellness Virtual Visit.   Cardiac Risk Factors include: advanced age (>23men, >24 women);hypertension;diabetes mellitus     Objective:    Today's Vitals   11/01/20 0937  Weight: 198 lb 8 oz (90 kg)  Height: 5' 2.5" (1.588 m)   Body mass index is 35.73 kg/m.  Advanced Directives 11/01/2020 10/30/2019 10/03/2018 02/06/2018 10/02/2017 08/21/2017 05/31/2017  Does Patient Have a Medical Advance Directive? Yes Yes No No No No No  Type of Paramedic of [place];Living will [place];Living will - - - - -  Does patient want to make changes to medical advance directive? No - Patient declined No - Patient declined - - Yes (MAU/Ambulatory/Procedural Areas - Information given) - -  Copy of [place] in Chart? No - copy requested No - copy requested - - - - -  Would patient like information on creating a medical advance directive? - - No - Patient declined No - Patient declined - Yes (ED - Information included in AVS) No - Patient declined    Current Medications (verified) Outpatient Encounter Medications as of 11/01/2020  Medication Sig   anastrozole (ARIMIDEX) 1 MG tablet Take 1 tablet (1 mg total) by mouth daily.   aspirin 81 MG tablet Take 81 mg by mouth daily.   cyclobenzaprine (FLEXERIL) 10 MG tablet TAKE 1 TABLET BY MOUTH 3  TIMES DAILY AS NEEDED FOR  MUSCLE SPASM(S)   fluticasone (FLONASE) 50 MCG/ACT nasal spray Place 2 sprays into both nostrils daily.   glucose blood test strip Use daily to test blood sugar E11.9 one touch verio   metFORMIN (GLUCOPHAGE-XR) 750 MG 24 hr tablet TAKE 1 TABLET BY MOUTH  DAILY WITH BREAKFAST   non-metallic deodorant (ALRA) MISC Apply 1 application topically.   nystatin (MYCOSTATIN/NYSTOP) powder Apply 1 application topically 2 (two) times  daily. To rash until resolved.   simvastatin (ZOCOR) 40 MG tablet TAKE 1 TABLET BY MOUTH  DAILY   spironolactone (ALDACTONE) 25 MG tablet Take 1 tablet (25 mg total) by mouth daily.   telmisartan-hydrochlorothiazide (MICARDIS HCT) 80-25 MG tablet TAKE 1 TABLET BY MOUTH IN  THE EVENING   traZODone (DESYREL) 50 MG tablet TAKE 0.5-1 TABLETS (25-50 MG TOTAL) BY MOUTH AT BEDTIME AS NEEDED FOR SLEEP.   triamcinolone cream (KENALOG) 0.1 % Apply topically 2 (two) times daily. For itchy rash   No facility-administered encounter medications on file as of 11/01/2020.    Allergies (verified) Patient has no known allergies.   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   Cancer ([place]) 04/2017   right breast cancer   Colon polyp 2008   Diabetes mellitus without complication ([place])    Endometrial hyperplasia 2013   GERD (gastroesophageal reflux disease)    OCC-NO MEDS   Headache    H/O MIGRAINES   Hypertension    Papilloma 11/20/2016   right breast   Personal history of radiation therapy    2018   Status post dilation of esophageal narrowing 2008   Past Surgical History:  Procedure Laterality Date   BREAST BIOPSY Right 11/02/2016   ATYPICAL PAPILLARY LESION,   BREAST EXCISIONAL BIOPSY Right 11/20/2016   AHD   BREAST LUMPECTOMY Right 11/20/2016   Procedure: BREAST LUMPECTOMY;  Surgeon: [name], MD;  Location: [facility] ORS;  Service: General;  Laterality: Right;   BREAST LUMPECTOMY Right 04/2017   lumpectomy 2018, excision 2017   COLONOSCOPY  2008   in [place] LYMPH NODE BIOPSY Right 05/16/2017   Procedure: RIGHT RADIOACTIVE SEED GUIDED LUMPECTOMY  WITH  RIGHT BREAST SEED GUIDED EXCISIONAL BIOPSY AND RIGHT AXILLARY SENTINEL  NODE BIOPSY;  Surgeon: [name], MD;  Location: [facility];  Service: General;  Laterality: Right;  2 SEEDS RIGHT BREAST  GENERAL AND PEC BLOCK ANESHTESIA   Family History  Problem Relation Age of Onset   Hypertension Mother    Heart disease Father 82   Bone cancer Brother    Hypertension Daughter    Cancer Maternal Aunt 39       breast and ovary   Breast cancer Maternal Aunt    Pancreatic cancer Maternal Aunt    Lymphoma Maternal Aunt    Social History   Socioeconomic History   Marital status: Married    Spouse name: Not on file   Number of children: Not on file   Years of education: Not on file   Highest education level: Not on file  Occupational History   Not on file  Tobacco Use   Smoking status: Former Smoker    Packs/day: 0.25    Years: 5.00    Pack years: 1.25    Types: Cigarettes    Quit date: 07/24/1990    Years since quitting: 30.2   Smokeless tobacco: Never Used  Substance and Sexual Activity   Alcohol use: Yes    Comment: Socially   Drug use: No   Sexual activity: Not Currently  Other Topics Concern   Not on file  Social History Narrative   Lives in [place]. From [place]. Son lives with pt. Dog in home.      Work - [employer], and Theme park manager, now retired.      Diet - regular      Exercise - no regular   Social Determinants of Health   Financial Resource Strain: Low Risk    Difficulty of Paying Living Expenses: Not hard at all  Food Insecurity: No Food Insecurity   Worried About Charity fundraiser in the Last Year: Never true   Ran Out of Food in the Last Year: Never true  Transportation Needs: No Transportation Needs   Lack of Transportation (Medical): No   Lack of Transportation (Non-Medical): No  Physical Activity: Sufficiently Active   Days of Exercise per Week: 4 days   Minutes of Exercise per Session: 40 min  Stress: No Stress Concern Present   Feeling of Stress : Not at all  Social Connections: Unknown   Frequency of Communication with Friends and Family: More than three times a week    Frequency of Social Gatherings with Friends and Family: More than three times a week   Attends Religious Services: Not on Electrical engineer or Organizations: Not on file   Attends Archivist Meetings: Not on file   Marital Status: Married    Tobacco Counseling Counseling given: Not Answered   Clinical Intake:  Pre-visit preparation completed: Yes        Diabetes: Yes (Followed by pcp)  How often do you need to have someone help you when you read instructions, pamphlets, or other written materials  from your doctor or pharmacy?: 1 - Never  Nutrition Risk Assessment: Has the patient had any N/V/D within the last 2 months?  No  Does the patient have any non-healing wounds?  No  Has the patient had any unintentional weight loss or weight gain?  No   Diabetes: If diabetic, was a CBG obtained today?  No  Did the patient bring in their glucometer from home?  No  How often do you monitor your CBG's? Three times weekly.   Financial Strains and Diabetes Management: Are you having any financial strains with the device, your supplies or your medication? No .  Does the patient want to be seen by Chronic Care Management for management of their diabetes?  No  Would the patient like to be referred to a Nutritionist or for Diabetic Management?  No   Diabetic Exams: Diabetic Eye Exam: Completed 10/31/19 . Plans to schedule.  Diabetic Foot Exam: Completed 10/13/19 . Followed by pcp.   Interpreter Needed?: No    Activities of Daily Living In your present state of health, do you have any difficulty performing the following activities: 11/01/2020  Hearing? N  Vision? N  Difficulty concentrating or making decisions? N  Walking or climbing stairs? N  Dressing or bathing? N  Doing errands, shopping? N  Preparing Food and eating ? N  Using the Toilet? N  In the past six months, have you accidently leaked urine? N  Do you have problems with loss of bowel control? N    Managing your Medications? N  Managing your Finances? N  Housekeeping or managing your Housekeeping? N  Some recent data might be hidden    Patient Care Team: [name], MD as PCP - General (Internal Medicine) [name], MD (General Surgery) [name], MD as Consulting Physician (Hematology and Oncology) Delice Bison, Charlestine Massed, NP as Nurse Practitioner (Hematology and Oncology) Kyung Rudd, MD as Consulting Physician (Radiation Oncology) [name], MD as Consulting Physician (General Surgery)  Indicate any recent Medical Services you may have received from other than [facility] providers in the past year (date may be approximate).     Assessment:   This is a routine wellness examination for Martha [name].  I connected with Martha [name] today by telephone and verified that I am speaking with the correct person using two identifiers. Location patient: home Location provider: work Persons participating in the virtual visit: patient, Marine scientist.    I discussed the limitations, risks, security and privacy concerns of performing an evaluation and management service by telephone and the availability of in person appointments. The patient expressed understanding and verbally consented to this telephonic visit.    Interactive audio and video telecommunications were attempted between this provider and patient, however failed, due to patient having technical difficulties OR patient did not have access to video capability.  We continued and completed visit with audio only.  Some vital signs may be absent or patient reported.   Hearing/Vision screen  Hearing Screening   125Hz  250Hz  500Hz  1000Hz  2000Hz  3000Hz  4000Hz  6000Hz  8000Hz   Right ear:           Left ear:           Comments: Patient is able to hear conversational tones without difficulty. No issues reported.   Vision Screening Comments: No retinopathy reported  Cataract extraction  They have regular follow up with the  ophthalmologist  Dietary issues and exercise activities discussed: Current Exercise Habits: Home exercise routine, Type of exercise: walking, Time (Minutes):  30, Frequency (Times/Week): 4, Weekly Exercise (Minutes/Week): 120, Intensity: Mild  Low carb diet Good water intake  Goals      Follow up with Primary Care Provider     As needed       Depression Screen PHQ 2/9 Scores 11/01/2020 10/30/2019 07/17/2019 10/03/2018 10/02/2017 09/20/2016 07/20/2016  PHQ - 2 Score 0 0 0 0 0 0 0  PHQ- 9 Score - - - - 0 - -    Fall Risk Fall Risk  11/01/2020 08/13/2020 05/13/2020 10/30/2019 10/13/2019  Falls in the past year? 0 0 0 0 0  Number falls in past yr: 0 - - - -  Injury with Fall? - - - - -  Follow up Falls evaluation completed Falls evaluation completed Falls evaluation completed - Falls evaluation completed   Handrails in use when climbing stairs? Yes Home free of loose throw rugs in walkways, pet beds, electrical cords, etc? Yes  Adequate lighting in your home to reduce risk of falls? Yes   ASSISTIVE DEVICES UTILIZED TO PREVENT FALLS: Life alert? No  Use of a cane, walker or w/c? No   TIMED UP AND GO: Was the test performed? No . Virtual visit.   Cognitive Function: MMSE - Mini Mental State Exam 10/03/2018 10/02/2017 09/20/2016  Orientation to time 5 5 5   Orientation to Place 5 5 5   Registration 3 3 3   Attention/ Calculation 5 5 5   Recall 3 3 3   Language- name 2 objects 2 2 2   Language- repeat 1 1 1   Language- follow 3 step command 3 3 3   Language- read & follow direction 1 1 1   Write a sentence 1 1 1   Copy design 1 1 1   Total score 30 30 30      6CIT Screen 11/01/2020 10/30/2019  What Year? 0 points 0 points  What month? 0 points 0 points  What time? 0 points 0 points  Count back from 20 0 points 0 points  Months in reverse 0 points 0 points  Repeat phrase 0 points 0 points  Total Score 0 0    Immunizations Immunization History  Administered Date(s) Administered   Fluad  Quad(high Dose 65+) 09/11/2019, 10/06/2020   Influenza Split 10/15/2014   Influenza, High Dose Seasonal PF 10/03/2018   Influenza,inj,Quad PF,6+ Mos 09/20/2016   Influenza-Unspecified 10/24/2012, 10/12/2013, 10/01/2017   PFIZER SARS-COV-2 Vaccination 02/06/2020, 03/02/2020, 10/08/2020   Pneumococcal Conjugate-13 06/21/2015, 08/25/2017   Pneumococcal Polysaccharide-23 10/24/2012   Tdap 07/24/2009, 10/10/2019, 10/31/2019   Pneumovax 23- Advised may receive at local pharmacy, health department or in office visit. Plans to receive at later date. Deferred.   Health Maintenance Health Maintenance  Topic Date Due   FOOT EXAM  10/12/2020   OPHTHALMOLOGY EXAM  10/30/2020   PNA vac Low Risk Adult (2 of 2 - PPSV23) 12/06/2020 (Originally 08/25/2018)   HEMOGLOBIN A1C  02/13/2021   MAMMOGRAM  09/29/2022   Fecal DNA (Cologuard)  08/26/2023   TETANUS/TDAP  10/30/2029   INFLUENZA VACCINE  Completed   DEXA SCAN  Completed   COVID-19 Vaccine  Completed   Cologuard- 06/21/17. Negative.   Mammogram status: Completed 09/29/20. Repeat every year .Bilateral MM, CAD/TOMO.   Bone density- 07/28/15. Normal.   Lung Cancer Screening: (Low Dose CT Chest recommended if Age 78-80 years, 30 pack-year currently smoking OR have quit w/in 15years.) does not qualify.   Hepatitis C Screening: declined.   Vision Screening: Recommended annual ophthalmology exams for early detection of glaucoma and other disorders of  the eye. Is the patient up to date with their annual eye exam?  Yes  Who is the provider or what is the name of the office in which the patient attends annual eye exams? [facility]  Dental Screening: Recommended annual dental exams for proper oral hygiene.   Community Resource Referral / Chronic Care Management: CRR required this visit?  No   CCM required this visit?  No      Plan:   Keep all routine maintenance appointments.   Follow up 12/06/20 @ 9:30  I have personally reviewed and  noted the following in the patient's chart:   Medical and social history Use of alcohol, tobacco or illicit drugs  Current medications and supplements Functional ability and status Nutritional status Physical activity Advanced directives List of other physicians Hospitalizations, surgeries, and ER visits in previous 12 months Vitals Screenings to include cognitive, depression, and falls Referrals and appointments  In addition, I have reviewed and discussed with patient certain preventive protocols, quality metrics, and best practice recommendations. A written personalized care plan for preventive services as well as general preventive health recommendations were provided to patient via mychart.     OBrien-Blaney, Anina Schnake L, LPN   55/01/801      I have reviewed the above information and agree with above.   Deborra Medina, MD

## 2020-11-04 ENCOUNTER — Other Ambulatory Visit: Payer: Self-pay | Admitting: Internal Medicine

## 2020-11-12 MED ORDER — CYCLOBENZAPRINE HCL 10 MG PO TABS: ORAL_TABLET | ORAL | 1 refills | Status: DC

## 2020-12-06 ENCOUNTER — Ambulatory Visit (INDEPENDENT_AMBULATORY_CARE_PROVIDER_SITE_OTHER): Payer: Medicare Other | Admitting: Internal Medicine

## 2020-12-06 ENCOUNTER — Encounter: Payer: Self-pay | Admitting: Internal Medicine

## 2020-12-06 ENCOUNTER — Other Ambulatory Visit: Payer: Self-pay

## 2020-12-06 VITALS — BP 134/68 | HR 109 | Temp 98.0°F | Resp 15 | Ht 62.25 in | Wt 194.4 lb

## 2020-12-06 DIAGNOSIS — E1121 Type 2 diabetes mellitus with diabetic nephropathy: Secondary | ICD-10-CM | POA: Diagnosis not present

## 2020-12-06 DIAGNOSIS — R944 Abnormal results of kidney function studies: Secondary | ICD-10-CM

## 2020-12-06 DIAGNOSIS — I10 Essential (primary) hypertension: Secondary | ICD-10-CM

## 2020-12-06 DIAGNOSIS — Z6836 Body mass index (BMI) 36.0-36.9, adult: Secondary | ICD-10-CM

## 2020-12-06 DIAGNOSIS — R748 Abnormal levels of other serum enzymes: Secondary | ICD-10-CM | POA: Diagnosis not present

## 2020-12-06 DIAGNOSIS — E7800 Pure hypercholesterolemia, unspecified: Secondary | ICD-10-CM | POA: Diagnosis not present

## 2020-12-06 LAB — LIPID PANEL
Cholesterol: 128 mg/dL (ref 0–200)
HDL: 52.9 mg/dL (ref >39.00)
LDL Cholesterol: 58 mg/dL (ref 0–99)
NonHDL: 75.31
Total CHOL/HDL Ratio: 2
Triglycerides: 88 mg/dL (ref 0.0–149.0)
VLDL: 17.6 mg/dL (ref 0.0–40.0)

## 2020-12-06 LAB — COMPREHENSIVE METABOLIC PANEL
ALT: 8 U/L (ref 0–35)
AST: 12 U/L (ref 0–37)
Albumin: 4.1 g/dL (ref 3.5–5.2)
Alkaline Phosphatase: 131 U/L — ABNORMAL HIGH (ref 39–117)
BUN: 22 mg/dL (ref 6–23)
CO2: 31 mEq/L (ref 19–32)
Calcium: 9.9 mg/dL (ref 8.4–10.5)
Chloride: 101 mEq/L (ref 96–112)
Creatinine, Ser: 1.21 mg/dL — ABNORMAL HIGH (ref 0.40–1.20)
GFR: 45.41 mL/min — ABNORMAL LOW (ref >60.00)
Glucose, Bld: 142 mg/dL — ABNORMAL HIGH (ref 70–99)
Potassium: 4.3 mEq/L (ref 3.5–5.1)
Sodium: 137 mEq/L (ref 135–145)
Total Bilirubin: 0.5 mg/dL (ref 0.2–1.2)
Total Protein: 6.9 g/dL (ref 6.0–8.3)

## 2020-12-06 LAB — HEMOGLOBIN A1C: Hgb A1c MFr Bld: 6.7 % — ABNORMAL HIGH (ref 4.6–6.5)

## 2020-12-06 NOTE — Patient Instructions (Signed)
Congraulations!  You look fantastic!   You do not need to reschedule the Vascular surgery appointment .    the trazodone is SAFE!  Take your dose 1 hour prior to bedtime.  Start with 1/2 tablet .  Maximal tolerated dose is 100 mg

## 2020-12-06 NOTE — Progress Notes (Signed)
Subjective:  Patient ID: Martha [name], female    DOB: 06/03/1951  Age: 69 y.o. MRN: [id number]  CC: The primary encounter diagnosis was Controlled type 2 diabetes mellitus with diabetic nephropathy, without long-term current use of insulin ([place]). Diagnoses of Pure hypercholesterolemia, Elevated alkaline phosphatase level, Essential hypertension, benign, Decreased calculated GFR, and Class 2 severe obesity due to excess calories with serious comorbidity and body mass index (BMI) of 36.0 to 36.9 in adult [facility]) were also pertinent to this visit.  HPI Martha [name] presents for follow up on type 2 DM with obesity,   hypertension and hyperlipidemia  This visit occurred during the SARS-CoV-2 public health emergency.  Safety protocols were in place, including screening questions prior to the visit, additional usage of staff PPE, and extensive cleaning of exam room while observing appropriate contact time as indicated for disinfecting solutions.    Type 2 DM:  Has lost 26 lbs since February  .  Now working full time for [employer] and walking a lot more . Not checking sugars as often  . Had eye exam this year   Vascular:  Was referred to Dr Delana Meyer for suspicion of PAD based on some unexplained home RN visit .  patient had a death in family and called to cancel with < 24 hrs notice  So they "no showed her"  Denies claudication symptoms.  Does not want to return to their office   Foot exam normal  Today  Hypertension: patient checks blood pressure twice weekly at home.  Readings have been for the most part < 140/80 at rest . Patient is following a reduce salt diet most days and is taking medications as prescribed  Insomnia:  Waking up frequently.    Did not try the trazodone after reading the side effect profile.    H/o breast cancer:  Treated with lumpectomy in 2018 followed by XRT and arimidex, ongoing   Annual follow ups with oncology  Was in Feb 2021    Outpatient Medications Prior to Visit   Medication Sig Dispense Refill  . anastrozole (ARIMIDEX) 1 MG tablet Take 1 tablet (1 mg total) by mouth daily. 90 tablet 3  . aspirin 81 MG tablet Take 81 mg by mouth daily.    . cyclobenzaprine (FLEXERIL) 10 MG tablet TAKE 1 TABLET BY MOUTH 3  TIMES DAILY AS NEEDED FOR  MUSCLE SPASM(S) 90 tablet 1  . fluticasone (FLONASE) 50 MCG/ACT nasal spray Place 2 sprays into both nostrils daily. 16 g 2  . glucose blood test strip Use daily to test blood sugar E11.9 one touch verio 100 each 12  . metFORMIN (GLUCOPHAGE-XR) 750 MG 24 hr tablet TAKE 1 TABLET BY MOUTH  DAILY WITH BREAKFAST 90 tablet 3  . non-metallic deodorant (ALRA) MISC Apply 1 application topically.    . nystatin (MYCOSTATIN/NYSTOP) powder Apply 1 application topically 2 (two) times daily. To rash until resolved. 45 g 3  . simvastatin (ZOCOR) 40 MG tablet TAKE 1 TABLET BY MOUTH  DAILY 90 tablet 3  . telmisartan-hydrochlorothiazide (MICARDIS HCT) 80-25 MG tablet TAKE 1 TABLET BY MOUTH IN  THE EVENING 90 tablet 3  . traZODone (DESYREL) 50 MG tablet TAKE 0.5-1 TABLETS (25-50 MG TOTAL) BY MOUTH AT BEDTIME AS NEEDED FOR SLEEP. 90 tablet 2  . triamcinolone cream (KENALOG) 0.1 % Apply topically 2 (two) times daily. For itchy rash 45 g 1  . spironolactone (ALDACTONE) 25 MG tablet Take 1 tablet (25 mg total) by mouth daily. (Patient  not taking: Reported on 12/06/2020) 30 tablet 3   No facility-administered medications prior to visit.    Review of Systems;  Patient denies headache, fevers, malaise, unintentional weight loss, skin rash, eye pain, sinus congestion and sinus pain, sore throat, dysphagia,  hemoptysis , cough, dyspnea, wheezing, chest pain, palpitations, orthopnea, edema, abdominal pain, nausea, melena, diarrhea, constipation, flank pain, dysuria, hematuria, urinary  Frequency, nocturia, numbness, tingling, seizures,  Focal weakness, Loss of consciousness,  Tremor, insomnia, depression, anxiety, and suicidal ideation.       Objective:  BP 134/68 (BP Location: Left Arm, Patient Position: Sitting, Cuff Size: Large)   Pulse (!) 109   Temp 98 F (36.7 C) (Oral)   Resp 15   Ht 5' 2.25" (1.581 m)   Wt 194 lb 6.4 oz (88.2 kg)   SpO2 97%   BMI 35.27 kg/m   BP Readings from Last 3 Encounters:  12/06/20 134/68  08/13/20 122/76  05/13/20 130/74    Wt Readings from Last 3 Encounters:  12/06/20 194 lb 6.4 oz (88.2 kg)  11/01/20 198 lb 8 oz (90 kg)  08/13/20 204 lb 12.8 oz (92.9 kg)    General appearance: alert, cooperative and appears stated age Ears: normal TM's and external ear canals both ears Throat: lips, mucosa, and tongue normal; teeth and gums normal Neck: no adenopathy, no carotid bruit, supple, symmetrical, trachea midline and thyroid not enlarged, symmetric, no tenderness/mass/nodules Back: symmetric, no curvature. ROM normal. No CVA tenderness. Lungs: clear to auscultation bilaterally Heart: regular rate and rhythm, S1, S2 normal, no murmur, click, rub or gallop Abdomen: soft, non-tender; bowel sounds normal; no masses,  no organomegaly Pulses: 2+ and symmetric Skin: Skin color, texture, turgor normal. No rashes or lesions Lymph nodes: Cervical, supraclavicular, and axillary nodes normal.  Lab Results  Component Value Date   HGBA1C 6.7 (H) 12/06/2020   HGBA1C 6.9 (H) 08/13/2020   HGBA1C 7.0 (H) 05/13/2020    Lab Results  Component Value Date   CREATININE 1.21 (H) 12/06/2020   CREATININE 1.08 08/24/2020   CREATININE 1.16 08/13/2020    Lab Results  Component Value Date   WBC 8.8 07/20/2016   HGB 12.1 07/20/2016   HCT 36.9 07/20/2016   PLT 323.0 07/20/2016   GLUCOSE 142 (H) 12/06/2020   CHOL 128 12/06/2020   TRIG 88.0 12/06/2020   HDL 52.90 12/06/2020   LDLDIRECT 71.0 09/14/2017   LDLCALC 58 12/06/2020   ALT 8 12/06/2020   AST 12 12/06/2020   NA 137 12/06/2020   K 4.3 12/06/2020   CL 101 12/06/2020   CREATININE 1.21 (H) 12/06/2020   BUN 22 12/06/2020   CO2 31  12/06/2020   TSH 1.43 08/13/2020   HGBA1C 6.7 (H) 12/06/2020   MICROALBUR 1.0 05/13/2020    MM DIAG BREAST TOMO BILATERAL  Result Date: 09/29/2020 CLINICAL DATA:  69 year old who underwent malignant lumpectomy of the RIGHT breast in 2018 with adjuvant radiation therapy. Annual evaluation. EXAM: DIGITAL DIAGNOSTIC BILATERAL MAMMOGRAM WITH TOMO AND CAD COMPARISON:  Previous exam(s). ACR Breast Density Category b: There are scattered areas of fibroglandular density. FINDINGS: Tomosynthesis and synthesized full field CC and MLO views of both breasts were obtained. Standard spot magnification MLO view of the lumpectomy site in the RIGHT breast was also obtained. Post surgical scar/architectural distortion at the lumpectomy site in the UPPER OUTER RIGHT breast at MIDDLE depth. Benign dystrophic calcifications associated with fat necrosis/oil cyst at the lumpectomy site. Residual moderate post radiation skin thickening and mild trabecular thickening  throughout the RIGHT breast. No findings suspicious for malignancy in the RIGHT breast. No findings suspicious for malignancy in the LEFT breast. Mammographic images were processed with CAD. IMPRESSION: 1. No mammographic evidence of malignancy involving either breast. 2. Expected post lumpectomy changes and residual moderate post radiation changes involving the RIGHT breast. RECOMMENDATION: BILATERAL diagnostic mammography in 1 year. I have discussed the findings and recommendations with the patient. If applicable, a reminder letter will be sent to the patient regarding the next appointment. BI-RADS CATEGORY  2: Benign. Electronically Signed   By: Evangeline Dakin M.D.   On: 09/29/2020 12:07    Assessment & Plan:   Problem List Items Addressed This Visit      Unprioritized   Controlled type 2 diabetes mellitus with diabetic nephropathy, without long-term current use of insulin ([place]) - Primary     Glycemic control is at goal with Metformin XR  and has improved  with weight loss and increased activity. . Patient is up-to-date on eye exams and foot exam is normal today. Patient has microalbuminuria, is tolerating statin therapy for CAD risk reduction and ARB for renal protection and hypertension.   Lab Results  Component Value Date   HGBA1C 6.7 (H) 12/06/2020   Lab Results  Component Value Date   MICROALBUR 1.0 05/13/2020         Relevant Orders   Hemoglobin A1c (Completed)   Comprehensive metabolic panel (Completed)   Hyperlipidemia    .LDL and triglycerides are at goal on simvastatin . SHE has no side effects and liver enzymes are normal except for persistently mild elevation in alk phos.  Fatty liver suspected .  Workup recommended . No changes today   Lab Results  Component Value Date   CHOL 128 12/06/2020   HDL 52.90 12/06/2020   LDLCALC 58 12/06/2020   LDLDIRECT 71.0 09/14/2017   TRIG 88.0 12/06/2020   CHOLHDL 2 12/06/2020   Lab Results  Component Value Date   ALT 8 12/06/2020   AST 12 12/06/2020   ALKPHOS 131 (H) 12/06/2020   BILITOT 0.5 12/06/2020         Relevant Orders   Lipid panel (Completed)   Essential hypertension, benign    Well controlled on current regimen of telmisartan /hctz.  Cr has risen slightly ,  Likely due to fasting state.  Will repeat in non fasting state  Lab Results  Component Value Date   CREATININE 1.21 (H) 12/06/2020   Lab Results  Component Value Date   NA 137 12/06/2020   K 4.3 12/06/2020   CL 101 12/06/2020   CO2 31 12/06/2020         Class 2 severe obesity due to excess calories with serious comorbidity and body mass index (BMI) of 36.0 to 36.9 in adult [facility])    I have congratulated her in reduction of   BMI and encouraged  Continued weight loss with goal of 10% of body weigh over the next 6 months using a low glycemic index diet and regular exercise a minimum of 5 days per week.        Elevated alkaline phosphatase level    Etiology suspected to be fatty liver,   Given no  history of RUQ pain .  Will order screening labs for autoimmune and viral hepatitis and abdominal ultrasound   Lab Results  Component Value Date   ALT 8 12/06/2020   AST 12 12/06/2020   ALKPHOS 131 (H) 12/06/2020   BILITOT 0.5 12/06/2020  Relevant Orders   Alkaline phosphatase, isoenzymes   ANA   Anti-Smith antibody   Hepatitis B core antibody, total   Hepatitis B surface antibody,qualitative   Hepatitis B surface antigen   Hepatitis C antibody   Mitochondrial antibodies   US Abdomen Limited    Other Visit Diagnoses    Decreased calculated GFR       Relevant Orders   Basic metabolic panel      I provided  30 minutes of  face-to-face time during this encounter reviewing patient's current problems and past surgeries, labs and imaging studies, providing counseling on the above mentioned problems , and coordination  of care .  I have discontinued Debralee Braaksma. Mcerlean's spironolactone. I am also having her maintain her aspirin, fluticasone, non-metallic deodorant, traZODone, anastrozole, nystatin, triamcinolone, simvastatin, glucose blood, metFORMIN, telmisartan-hydrochlorothiazide, and cyclobenzaprine.  No orders of the defined types were placed in this encounter.   Medications Discontinued During This Encounter  Medication Reason  . spironolactone (ALDACTONE) 25 MG tablet     Follow-up: No follow-ups on file.   [name], MD

## 2020-12-07 NOTE — Assessment & Plan Note (Signed)
.  LDL and triglycerides are at goal on simvastatin . SHE has no side effects and liver enzymes are normal except for persistently mild elevation in alk phos.  Fatty liver suspected .  Workup recommended . No changes today   Lab Results  Component Value Date   CHOL 128 12/06/2020   HDL 52.90 12/06/2020   LDLCALC 58 12/06/2020   LDLDIRECT 71.0 09/14/2017   TRIG 88.0 12/06/2020   CHOLHDL 2 12/06/2020   Lab Results  Component Value Date   ALT 8 12/06/2020   AST 12 12/06/2020   ALKPHOS 131 (H) 12/06/2020   BILITOT 0.5 12/06/2020

## 2020-12-07 NOTE — Assessment & Plan Note (Signed)
I have congratulated her in reduction of   BMI and encouraged  Continued weight loss with goal of 10% of body weigh over the next 6 months using a low glycemic index diet and regular exercise a minimum of 5 days per week.    

## 2020-12-07 NOTE — Assessment & Plan Note (Signed)
Well controlled on current regimen of telmisartan /hctz.  Cr has risen slightly ,  Likely due to fasting state.  Will repeat in non fasting state  Lab Results  Component Value Date   CREATININE 1.21 (H) 12/06/2020   Lab Results  Component Value Date   NA 137 12/06/2020   K 4.3 12/06/2020   CL 101 12/06/2020   CO2 31 12/06/2020

## 2020-12-07 NOTE — Assessment & Plan Note (Signed)
Etiology suspected to be fatty liver,   Given no history of RUQ pain .  Will order screening labs for autoimmune and viral hepatitis and abdominal ultrasound   Lab Results  Component Value Date   ALT 8 12/06/2020   AST 12 12/06/2020   ALKPHOS 131 (H) 12/06/2020   BILITOT 0.5 12/06/2020

## 2020-12-07 NOTE — Progress Notes (Signed)
Your  a1c and cholesterol are at goal .  However  Your kidney function is off a little and the liver enzyme is still elevated.  Since you were fasting ,  It may be from mild dehydration.  We will recheck it with some additional liver tests I have ordered to investigate the cause of your elevated alk phos. Please schedule a lab appt for those blood tests,  and the office will call you to set up an ultrasound of your liver.

## 2020-12-07 NOTE — Assessment & Plan Note (Signed)
Glycemic control is at goal with Metformin XR  and has improved with weight loss and increased activity. . Patient is up-to-date on eye exams and foot exam is normal today. Patient has microalbuminuria, is tolerating statin therapy for CAD risk reduction and ARB for renal protection and hypertension.   Lab Results  Component Value Date   HGBA1C 6.7 (H) 12/06/2020   Lab Results  Component Value Date   MICROALBUR 1.0 05/13/2020

## 2020-12-13 ENCOUNTER — Other Ambulatory Visit: Payer: Medicare Other

## 2020-12-14 ENCOUNTER — Ambulatory Visit
Admission: RE | Admit: 2020-12-14 | Discharge: 2020-12-14 | Disposition: A | Discharge status: Home / Self Care | Payer: Medicare Other | Source: Ambulatory Visit | Attending: Internal Medicine | Admitting: Internal Medicine

## 2020-12-14 ENCOUNTER — Other Ambulatory Visit: Payer: Self-pay

## 2020-12-14 DIAGNOSIS — K8020 Calculus of gallbladder without cholecystitis without obstruction: Secondary | ICD-10-CM | POA: Diagnosis not present

## 2020-12-14 DIAGNOSIS — K7689 Other specified diseases of liver: Secondary | ICD-10-CM | POA: Diagnosis not present

## 2020-12-14 DIAGNOSIS — R748 Abnormal levels of other serum enzymes: Secondary | ICD-10-CM | POA: Insufficient documentation

## 2020-12-14 DIAGNOSIS — K760 Fatty (change of) liver, not elsewhere classified: Secondary | ICD-10-CM | POA: Diagnosis not present

## 2020-12-15 ENCOUNTER — Telehealth: Payer: Self-pay | Admitting: Hematology and Oncology

## 2020-12-15 DIAGNOSIS — C50911 Malignant neoplasm of unspecified site of right female breast: Secondary | ICD-10-CM | POA: Diagnosis not present

## 2020-12-15 NOTE — Telephone Encounter (Signed)
Rescheduled appointment due to provider PAL. Patient is aware of changes. 

## 2020-12-18 NOTE — Progress Notes (Signed)
Your ultrasound showed "sludge" in the gallbladder  without evidence of any obstructing gallstones,  and  findings consistent with fatty liver. An incidental finding of a hepatic cyst was also noted. I would like to make a GI referral to follow up on these issues.

## 2020-12-20 ENCOUNTER — Other Ambulatory Visit (INDEPENDENT_AMBULATORY_CARE_PROVIDER_SITE_OTHER): Payer: Medicare Other

## 2020-12-20 ENCOUNTER — Other Ambulatory Visit: Payer: Self-pay

## 2020-12-20 DIAGNOSIS — R748 Abnormal levels of other serum enzymes: Secondary | ICD-10-CM

## 2020-12-20 DIAGNOSIS — R944 Abnormal results of kidney function studies: Secondary | ICD-10-CM

## 2020-12-20 LAB — BASIC METABOLIC PANEL
BUN: 24 mg/dL — ABNORMAL HIGH (ref 6–23)
CO2: 28 mEq/L (ref 19–32)
Calcium: 10 mg/dL (ref 8.4–10.5)
Chloride: 102 mEq/L (ref 96–112)
Creatinine, Ser: 1.11 mg/dL (ref 0.40–1.20)
GFR: 50.35 mL/min — ABNORMAL LOW (ref >60.00)
Glucose, Bld: 120 mg/dL — ABNORMAL HIGH (ref 70–99)
Potassium: 4.5 mEq/L (ref 3.5–5.1)
Sodium: 138 mEq/L (ref 135–145)

## 2020-12-21 ENCOUNTER — Other Ambulatory Visit: Payer: Self-pay | Admitting: Hematology and Oncology

## 2020-12-21 ENCOUNTER — Other Ambulatory Visit: Payer: Self-pay | Admitting: Internal Medicine

## 2020-12-21 DIAGNOSIS — K760 Fatty (change of) liver, not elsewhere classified: Secondary | ICD-10-CM | POA: Insufficient documentation

## 2020-12-21 DIAGNOSIS — R748 Abnormal levels of other serum enzymes: Secondary | ICD-10-CM

## 2020-12-21 DIAGNOSIS — K828 Other specified diseases of gallbladder: Secondary | ICD-10-CM

## 2020-12-21 DIAGNOSIS — C50411 Malignant neoplasm of upper-outer quadrant of right female breast: Secondary | ICD-10-CM

## 2020-12-21 DIAGNOSIS — K7689 Other specified diseases of liver: Secondary | ICD-10-CM | POA: Insufficient documentation

## 2020-12-21 LAB — ANTI-SMITH ANTIBODY: ENA SM Ab Ser-aCnc: <1 AI

## 2020-12-21 NOTE — Progress Notes (Signed)
Thus far there is no evidence of viral hepatitis or autoimmune syndromes to attribute your liver enzyme elevation to I recommend having a gastroenterologist to evaluate you and will make the referral  Regards,   Duncan Dull, MD

## 2020-12-22 LAB — HEPATITIS B CORE ANTIBODY, TOTAL: Hep B Core Total Ab: NONREACTIVE

## 2020-12-22 LAB — HEPATITIS B SURFACE ANTIBODY,QUALITATIVE: Hep B S Ab: NONREACTIVE

## 2020-12-22 LAB — ANA: Anti Nuclear Antibody (ANA): NEGATIVE

## 2020-12-22 LAB — ALKALINE PHOSPHATASE, ISOENZYMES
Alkaline Phosphatase: 131 IU/L — ABNORMAL HIGH (ref 44–121)
BONE FRACTION: 28 % (ref 14–68)
INTESTINAL FRAC.: 0 % (ref 0–18)
LIVER FRACTION: 72 % (ref 18–85)

## 2020-12-22 LAB — MITOCHONDRIAL ANTIBODIES: Mitochondrial M2 Ab, IgG: <=20 U

## 2020-12-22 LAB — HEPATITIS C ANTIBODY
Hepatitis C Ab: NONREACTIVE
SIGNAL TO CUT-OFF: 0.01 (ref <1.00)

## 2020-12-22 LAB — HEPATITIS B SURFACE ANTIGEN: Hepatitis B Surface Ag: NONREACTIVE

## 2020-12-26 DIAGNOSIS — J029 Acute pharyngitis, unspecified: Secondary | ICD-10-CM

## 2020-12-26 DIAGNOSIS — Z20822 Contact with and (suspected) exposure to covid-19: Secondary | ICD-10-CM

## 2020-12-27 ENCOUNTER — Other Ambulatory Visit (INDEPENDENT_AMBULATORY_CARE_PROVIDER_SITE_OTHER): Payer: Medicare Other

## 2020-12-27 ENCOUNTER — Other Ambulatory Visit: Payer: Medicare Other

## 2020-12-27 DIAGNOSIS — Z20822 Contact with and (suspected) exposure to covid-19: Secondary | ICD-10-CM | POA: Diagnosis not present

## 2020-12-27 DIAGNOSIS — J029 Acute pharyngitis, unspecified: Secondary | ICD-10-CM | POA: Diagnosis not present

## 2020-12-27 LAB — POC INFLUENZA A&B (BINAX/QUICKVUE)
Influenza A, POC: NEGATIVE
Influenza B, POC: NEGATIVE

## 2020-12-29 ENCOUNTER — Telehealth (INDEPENDENT_AMBULATORY_CARE_PROVIDER_SITE_OTHER): Payer: Medicare Other | Admitting: Internal Medicine

## 2020-12-29 ENCOUNTER — Other Ambulatory Visit: Payer: Self-pay

## 2020-12-29 ENCOUNTER — Encounter: Payer: Self-pay | Admitting: Internal Medicine

## 2020-12-29 DIAGNOSIS — J988 Other specified respiratory disorders: Secondary | ICD-10-CM | POA: Diagnosis not present

## 2020-12-29 DIAGNOSIS — B9789 Other viral agents as the cause of diseases classified elsewhere: Secondary | ICD-10-CM

## 2020-12-29 DIAGNOSIS — R748 Abnormal levels of other serum enzymes: Secondary | ICD-10-CM

## 2020-12-29 LAB — NOVEL CORONAVIRUS, NAA: SARS-CoV-2, NAA: NOT DETECTED

## 2020-12-29 LAB — SARS-COV-2, NAA 2 DAY TAT

## 2020-12-29 NOTE — Assessment & Plan Note (Addendum)
Symptoms have been steadily resolving ,  With COVID AND FLU TESTING Negative x 2 despite multiple sick family contacts.  No further workup required.

## 2020-12-29 NOTE — Assessment & Plan Note (Signed)
Reviewed recent labs and imaging.  Sludge, cholelithiasis without ductal dilation,  Fatty liver,  And hepatic cyst all noted on [place].  Autoimmune/viral hepatitis  workup negative .  She has a referral with GI for further evaluation and decision making

## 2020-12-29 NOTE — Progress Notes (Signed)
Telephone  Note  This visit type was conducted due to national recommendations for restrictions regarding the COVID-19 pandemic (e.g. social distancing).  This format is felt to be most appropriate for this patient at this time.  All issues noted in this document were discussed and addressed.  No physical exam was performed (except for noted visual exam findings with Video Visits).   I connected with@ on 12/29/20 at  4:00 PM EST by telephone and verified that I am speaking with the correct person using two identifiers. Location patient: home Location provider: work or home office Persons participating in the virtual visit: patient, provider  I discussed the limitations, risks, security and privacy concerns of performing an evaluation and management service by telephone and the availability of in person appointments. I also discussed with the patient that there may be a patient responsible charge related to this service. The patient expressed understanding and agreed to proceed.  Reason for visit: URI  HPI:  70 yr old female with h/o breast CA presents with URI symptoms after exposure to COVID in late December (SON WAS EXPOSED AT IN LAWS and became positive )   COVID AND FLU TEST NEGATIVE ON JAN 3. After home test was negative .  Symptoms included  chest congestion , rhinitis,  And hoarseness,  Temp to 99.  Has been sipping on a tea with ginger,lemon,lime and turmeric ,  And voice is improving.  Cough resolved.    ROS: See pertinent positives and negatives per HPI.  Past Medical History:  Diagnosis Date  . Arthritis   . 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 Cancer (HCC) 04/2017   right breast cancer  . Colon polyp 2008  . Diabetes mellitus without complication (HCC)   . Endometrial hyperplasia 2013  . GERD (gastroesophageal reflux disease)    OCC-NO MEDS  . Headache    H/O MIGRAINES  . Hypertension   . Papilloma 11/20/2016   right breast  . Personal history of  radiation therapy    2018  . Status post dilation of esophageal narrowing 2008    Past Surgical History:  Procedure Laterality Date  . BREAST BIOPSY Right 11/02/2016   ATYPICAL PAPILLARY LESION,  . BREAST EXCISIONAL BIOPSY Right 11/20/2016   AHD  . BREAST LUMPECTOMY Right 11/20/2016   Procedure: BREAST LUMPECTOMY;  Surgeon: Kieth Brightly, MD;  Location: [facility] ORS;  Service: General;  Laterality: Right;  . BREAST LUMPECTOMY Right 04/2017   lumpectomy 2018, excision 2017  . COLONOSCOPY  2008   in [place]  . DILATION AND CURETTAGE OF UTERUS    . RADIOACTIVE SEED GUIDED PARTIAL MASTECTOMY WITH AXILLARY SENTINEL LYMPH NODE BIOPSY Right 05/16/2017   Procedure: RIGHT RADIOACTIVE SEED GUIDED LUMPECTOMY  WITH  RIGHT BREAST SEED GUIDED EXCISIONAL BIOPSY AND RIGHT AXILLARY SENTINEL  NODE BIOPSY;  Surgeon: Emelia Loron, MD;  Location: [facility];  Service: General;  Laterality: Right;  2 SEEDS RIGHT BREAST  GENERAL AND PEC BLOCK ANESHTESIA    Family History  Problem Relation Age of Onset  . Hypertension Mother   . Heart disease Father 18  . Bone cancer Brother   . Hypertension Daughter   . Cancer Maternal Aunt 80       breast and ovary  . Breast cancer Maternal Aunt   . Pancreatic cancer Maternal Aunt   . Lymphoma Maternal Aunt     SOCIAL HX:  reports that she quit smoking about 30 years ago. Her smoking use  included cigarettes. She has a 1.25 pack-year smoking history. She has never used smokeless tobacco. She reports current alcohol use. She reports that she does not use drugs.   Current Outpatient Medications:  .  anastrozole (ARIMIDEX) 1 MG tablet, TAKE 1 TABLET BY MOUTH  DAILY, Disp: 90 tablet, Rfl: 3 .  aspirin 81 MG tablet, Take 81 mg by mouth daily., Disp: , Rfl:  .  cyclobenzaprine (FLEXERIL) 10 MG tablet, TAKE 1 TABLET BY MOUTH 3  TIMES DAILY AS NEEDED FOR  MUSCLE SPASM(S), Disp: 90 tablet, Rfl: 1 .  glucose blood test strip, Use daily to test blood  sugar E11.9 one touch verio, Disp: 100 each, Rfl: 12 .  metFORMIN (GLUCOPHAGE-XR) 750 MG 24 hr tablet, TAKE 1 TABLET BY MOUTH  DAILY WITH BREAKFAST, Disp: 90 tablet, Rfl: 3 .  non-metallic deodorant (ALRA) MISC, Apply 1 application topically., Disp: , Rfl:  .  nystatin (MYCOSTATIN/NYSTOP) powder, Apply 1 application topically 2 (two) times daily. To rash until resolved., Disp: 45 g, Rfl: 3 .  simvastatin (ZOCOR) 40 MG tablet, TAKE 1 TABLET BY MOUTH  DAILY, Disp: 90 tablet, Rfl: 3 .  telmisartan-hydrochlorothiazide (MICARDIS HCT) 80-25 MG tablet, TAKE 1 TABLET BY MOUTH IN  THE EVENING, Disp: 90 tablet, Rfl: 3  EXAM:   General impression: alert, cooperative and articulate.  No signs of being in distress  Lungs: speech is fluent sentence length suggests that patient is not short of breath and not punctuated by cough, sneezing or sniffing. Marland Kitchen   Psych: affect normal.  speech is articulate and non pressured .  Denies suicidal thoughts    ASSESSMENT AND PLAN:  Discussed the following assessment and plan:  Elevated alkaline phosphatase level  Viral respiratory illness  Elevated alkaline phosphatase level Reviewed recent labs and imaging.  Sludge, cholelithiasis without ductal dilation,  Fatty liver,  And hepatic cyst all noted on [place].  Autoimmune/viral hepatitis  workup negative .  She has a referral with GI for further evaluation and decision making   Viral respiratory illness Symptoms have been steadily resolving ,  With COVID AND FLU TESTING Negative x 2 despite multiple sick family contacts.  No further workup required.     I discussed the assessment and treatment plan with the patient. The patient was provided an opportunity to ask questions and all were answered. The patient agreed with the plan and demonstrated an understanding of the instructions.   The patient was advised to call back or seek an in-person evaluation if the symptoms worsen or if the condition fails to improve as  anticipated.  I provided 23 minutes of non-face-to-face time during this encounter.   [name], MD

## 2021-01-31 ENCOUNTER — Ambulatory Visit: Payer: Medicare Other | Admitting: Hematology and Oncology

## 2021-02-01 ENCOUNTER — Telehealth: Payer: Self-pay | Admitting: Hematology and Oncology

## 2021-02-01 NOTE — Telephone Encounter (Signed)
Rescheduled from 2/9 per provider. Called and spoke with pt, confirmed 2/17 appt

## 2021-02-02 ENCOUNTER — Ambulatory Visit: Payer: Medicare Other | Admitting: Hematology and Oncology

## 2021-02-03 ENCOUNTER — Other Ambulatory Visit: Payer: Self-pay

## 2021-02-03 ENCOUNTER — Ambulatory Visit: Payer: Medicare Other | Admitting: Gastroenterology

## 2021-02-03 ENCOUNTER — Encounter: Payer: Self-pay | Admitting: Gastroenterology

## 2021-02-03 VITALS — BP 107/68 | HR 73 | Temp 97.0°F | Ht 62.2 in | Wt 195.0 lb

## 2021-02-03 DIAGNOSIS — R1319 Other dysphagia: Secondary | ICD-10-CM

## 2021-02-03 DIAGNOSIS — K219 Gastro-esophageal reflux disease without esophagitis: Secondary | ICD-10-CM | POA: Diagnosis not present

## 2021-02-03 DIAGNOSIS — K760 Fatty (change of) liver, not elsewhere classified: Secondary | ICD-10-CM | POA: Diagnosis not present

## 2021-02-03 DIAGNOSIS — R748 Abnormal levels of other serum enzymes: Secondary | ICD-10-CM | POA: Diagnosis not present

## 2021-02-03 NOTE — H&P (View-Only) (Signed)
  Gastroenterology Consultation  Referring Provider:     Tullo, Teresa L, MD Primary Care Physician:  Tullo, Teresa L, MD Primary Gastroenterologist:  Dr. Yaeli Hartung     Reason for Consultation:     Fatty liver and increased phosphatase        HPI:   Martha [name] is a 69 y.o. y/o female referred for consultation & management of Fatty liver and increased alkaline phosphatase by Dr. Tullo, Teresa L, MD.  This patient comes in today with a history breast cancer diabetes and GERD. The patient was found to have an elevated alkaline phosphatase which was fractionated and showed it to be predominantly from liver origin.  The patient was then sent for a right upper quadrant ultrasound that showed her ultrasound findings to be:  IMPRESSION: Cholelithiasis without cholecystitis. Hepatic steatosis.  Subcentimeter right hepatic cyst.  The ultrasound was done in December 2021.  The patient's liver enzymes besides alk phosphatase have been normal and its trend has shown:  Component     Latest Ref Rng & Units 08/13/2020 08/24/2020 12/06/2020 12/20/2020  Total Bilirubin     0.2 - 1.2 mg/dL 0.5  0.5   Alkaline Phosphatase     44 - 121 IU/L 139 (H)  131 (H) 131 (H)  AST     0 - 37 U/L 15  12   ALT     0 - 35 U/L 9  8     In July 2021 the patient's liver enzymes including alkaline phosphatase states had been normal.It was reported that the patient's last colonoscopy was in 2008.  The patient had a negative Cologuard recently.  She also states that she has been having problems with swallowing.  She states that she can is the worst and sometimes she has to chase it down with liquids.  There is a history of having an upper endoscopy with dilation in the past and she was put on Nexium but she states that she did not want to take Nexium because she heard bad things about it so therefore she stopped taking it.  She does take Tums intermittently for her heartburn.  Past Medical History:  Diagnosis Date  .  Arthritis   . 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 Cancer (HCC) 04/2017   right breast cancer  . Colon polyp 2008  . Diabetes mellitus without complication (HCC)   . Endometrial hyperplasia 2013  . GERD (gastroesophageal reflux disease)    OCC-NO MEDS  . Headache    H/O MIGRAINES  . Hypertension   . Papilloma 11/20/2016   right breast  . Personal history of radiation therapy    2018  . Status post dilation of esophageal narrowing 2008    Past Surgical History:  Procedure Laterality Date  . BREAST BIOPSY Right 11/02/2016   ATYPICAL PAPILLARY LESION,  . BREAST EXCISIONAL BIOPSY Right 11/20/2016   AHD  . BREAST LUMPECTOMY Right 11/20/2016   Procedure: BREAST LUMPECTOMY;  Surgeon: Seeplaputhur G Sankar, MD;  Location: [facility];  Service: General;  Laterality: Right;  . BREAST LUMPECTOMY Right 04/2017   lumpectomy 2018, excision 2017  . COLONOSCOPY  2008   in [place]  . DILATION AND CURETTAGE OF UTERUS    . RADIOACTIVE SEED GUIDED PARTIAL MASTECTOMY WITH AXILLARY SENTINEL LYMPH NODE BIOPSY Right 05/16/2017   Procedure: RIGHT RADIOACTIVE SEED GUIDED LUMPECTOMY  WITH  RIGHT BREAST SEED GUIDED EXCISIONAL BIOPSY AND RIGHT AXILLARY SENTINEL  NODE BIOPSY;  Surgeon: Wakefield, Matthew,   MD;  Location: [facility];  Service: General;  Laterality: Right;  2 SEEDS RIGHT BREAST  GENERAL AND PEC BLOCK ANESHTESIA    Prior to Admission medications   Medication Sig Start Date End Date Taking? Authorizing Provider  anastrozole (ARIMIDEX) 1 MG tablet TAKE 1 TABLET BY MOUTH  DAILY 12/22/20   Gudena, Vinay, MD  aspirin 81 MG tablet Take 81 mg by mouth daily.    [provider]  cyclobenzaprine (FLEXERIL) 10 MG tablet TAKE 1 TABLET BY MOUTH 3  TIMES DAILY AS NEEDED FOR  MUSCLE SPASM(S) 11/12/20   Tullo, Teresa L, MD  glucose blood test strip Use daily to test blood sugar E11.9 one touch verio 08/13/20   Tullo, Teresa L, MD  metFORMIN (GLUCOPHAGE-XR) 750 MG  24 hr tablet TAKE 1 TABLET BY MOUTH  DAILY WITH BREAKFAST 10/01/20   Tullo, Teresa L, MD  non-metallic deodorant (ALRA) MISC Apply 1 application topically.    [provider]  nystatin (MYCOSTATIN/NYSTOP) powder Apply 1 application topically 2 (two) times daily. To rash until resolved. 05/13/20   Tullo, Teresa L, MD  simvastatin (ZOCOR) 40 MG tablet TAKE 1 TABLET BY MOUTH  DAILY 07/30/20   Tullo, Teresa L, MD  telmisartan-hydrochlorothiazide (MICARDIS HCT) 80-25 MG tablet TAKE 1 TABLET BY MOUTH IN  THE EVENING 11/05/20   Tullo, Teresa L, MD    Family History  Problem Relation Age of Onset  . Hypertension Mother   . Heart disease Father 59  . Bone cancer Brother   . Hypertension Daughter   . Cancer Maternal Aunt 80       breast and ovary  . Breast cancer Maternal Aunt   . Pancreatic cancer Maternal Aunt   . Lymphoma Maternal Aunt      Social History   Tobacco Use  . Smoking status: Former Smoker    Packs/day: 0.25    Years: 5.00    Pack years: 1.25    Types: Cigarettes    Quit date: 07/24/1990    Years since quitting: 30.5  . Smokeless tobacco: Never Used  Substance Use Topics  . Alcohol use: Yes    Comment: Socially  . Drug use: No    Allergies as of 02/03/2021  . (No Known Allergies)    Review of Systems:    All systems reviewed and negative except where noted in HPI.   Physical Exam:  There were no vitals taken for this visit. No LMP recorded. Patient is postmenopausal. General:   Alert,  Well-developed, well-nourished, pleasant and cooperative in NAD Head:  Normocephalic and atraumatic. Eyes:  Sclera clear, no icterus.   Conjunctiva pink. Ears:  Normal auditory acuity. Neck:  Supple; no masses or thyromegaly. Lungs:  Respirations even and unlabored.  Clear throughout to auscultation.   No wheezes, crackles, or rhonchi. No acute distress. Heart:  Regular rate and rhythm; no murmurs, clicks, rubs, or gallops. Abdomen:  Normal bowel sounds.  No bruits.  Soft,  non-tender and non-distended without masses, hepatosplenomegaly or hernias noted.  No guarding or rebound tenderness.  Negative Carnett sign.   Rectal:  Deferred.  Pulses:  Normal pulses noted. Extremities:  No clubbing or edema.  No cyanosis. Neurologic:  Alert and oriented x3;  grossly normal neurologically. Skin:  Intact without significant lesions or rashes.  No jaundice. Lymph Nodes:  No significant cervical adenopathy. Psych:  Alert and cooperative. Normal mood and affect.  Imaging Studies: No results found.  Assessment and Plan:   Martha M   [name] is a 69 y.o. y/o female who comes in today with a history of fatty liver with increased alkaline phosphatase and also had a small cyst that was subcentimeter.  The patient has no elevation of her AST and ALT and has been told that weight loss could help with her fatty infiltration of her liver.  As for her elevated alkaline phosphatase her fractionation showed it to be from a liver source and her AMA was negative in addition to the alkaline phosphatase being less than 50% elevated above the upper limit of normal observation is recommended.  See diagram below.  The patient was found to have hepatitis B surface antibody negative and will have her blood checked for hepatitis a total.  If she is found to have susceptibility to hepatitis A in addition to her not being immune to hepatitis B she will need vaccinations for A and B or B alone depending on the results of her labs.  The patient will also be set up for an upper endoscopy due to her dysphagia and a history of esophageal stricture.  The patient has been explained the plan and agrees with it.      Reinaldo Helt, MD. FACG    Note: This dictation was prepared with Dragon dictation along with smaller phrase technology. Any transcriptional errors that result from this process are unintentional.   

## 2021-02-03 NOTE — Progress Notes (Signed)
Gastroenterology Consultation  Referring Provider:     [name], MD Primary Care Physician:  [name], MD Primary Gastroenterologist:  Dr. Allen Norris     Reason for Consultation:     Fatty liver and increased phosphatase        HPI:   ADALENA ABDULLA is a 70 y.o. y/o female referred for consultation & management of Fatty liver and increased alkaline phosphatase by Dr. Derrel Nip, Aris Everts, MD.  This patient comes in today with a history breast cancer diabetes and GERD. The patient was found to have an elevated alkaline phosphatase which was fractionated and showed it to be predominantly from liver origin.  The patient was then sent for a right upper quadrant ultrasound that showed her ultrasound findings to be:  IMPRESSION: Cholelithiasis without cholecystitis. Hepatic steatosis.  Subcentimeter right hepatic cyst.  The ultrasound was done in December 2021.  The patient's liver enzymes besides alk phosphatase have been normal and its trend has shown:  Component     Latest Ref Rng & Units 08/13/2020 08/24/2020 12/06/2020 12/20/2020  Total Bilirubin     0.2 - 1.2 mg/dL 0.5  0.5   Alkaline Phosphatase     44 - 121 IU/L 139 (H)  131 (H) 131 (H)  AST     0 - 37 U/L 15  12   ALT     0 - 35 U/L 9  8     In July 2021 the patient's liver enzymes including alkaline phosphatase states had been normal.It was reported that the patient's last colonoscopy was in 2008.  The patient had a negative Cologuard recently.  She also states that she has been having problems with swallowing.  She states that she can is the worst and sometimes she has to chase it down with liquids.  There is a history of having an upper endoscopy with dilation in the past and she was put on Nexium but she states that she did not want to take Nexium because she heard bad things about it so therefore she stopped taking it.  She does take Tums intermittently for her heartburn.  Past Medical History:  Diagnosis Date  .  Arthritis   . 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 Cancer ([place]) 04/2017   right breast cancer  . Colon polyp 2008  . Diabetes mellitus without complication ([place])   . Endometrial hyperplasia 2013  . GERD (gastroesophageal reflux disease)    OCC-NO MEDS  . Headache    H/O MIGRAINES  . Hypertension   . Papilloma 11/20/2016   right breast  . Personal history of radiation therapy    2018  . Status post dilation of esophageal narrowing 2008    Past Surgical History:  Procedure Laterality Date  . BREAST BIOPSY Right 11/02/2016   ATYPICAL PAPILLARY LESION,  . BREAST EXCISIONAL BIOPSY Right 11/20/2016   AHD  . BREAST LUMPECTOMY Right 11/20/2016   Procedure: BREAST LUMPECTOMY;  Surgeon: [name], MD;  Location: [facility];  Service: General;  Laterality: Right;  . BREAST LUMPECTOMY Right 04/2017   lumpectomy 2018, excision 2017  . COLONOSCOPY  2008   in [place]  . DILATION AND CURETTAGE OF UTERUS    . RADIOACTIVE SEED GUIDED PARTIAL MASTECTOMY WITH AXILLARY SENTINEL LYMPH NODE BIOPSY Right 05/16/2017   Procedure: RIGHT RADIOACTIVE SEED GUIDED LUMPECTOMY  WITH  RIGHT BREAST SEED GUIDED EXCISIONAL BIOPSY AND RIGHT AXILLARY SENTINEL  NODE BIOPSY;  Surgeon: [name],  MD;  Location: [place];  Service: General;  Laterality: Right;  2 SEEDS RIGHT BREAST  GENERAL AND PEC BLOCK ANESHTESIA    Prior to Admission medications   Medication Sig Start Date End Date Taking? Authorizing Provider  anastrozole (ARIMIDEX) 1 MG tablet TAKE 1 TABLET BY MOUTH  DAILY 12/22/20   [name], MD  aspirin 81 MG tablet Take 81 mg by mouth daily.    [provider]  cyclobenzaprine (FLEXERIL) 10 MG tablet TAKE 1 TABLET BY MOUTH 3  TIMES DAILY AS NEEDED FOR  MUSCLE SPASM(S) 11/12/20   [name], MD  glucose blood test strip Use daily to test blood sugar E11.9 one touch verio 08/13/20   [name], MD  metFORMIN (GLUCOPHAGE-XR) 750 MG  24 hr tablet TAKE 1 TABLET BY MOUTH  DAILY WITH BREAKFAST 10/01/20   [name], MD  non-metallic deodorant [name]) MISC Apply 1 application topically.    [provider]  nystatin (MYCOSTATIN/NYSTOP) powder Apply 1 application topically 2 (two) times daily. To rash until resolved. 05/13/20   [name], MD  simvastatin (ZOCOR) 40 MG tablet TAKE 1 TABLET BY MOUTH  DAILY 07/30/20   [name], MD  telmisartan-hydrochlorothiazide (MICARDIS HCT) 80-25 MG tablet TAKE 1 TABLET BY MOUTH IN  THE EVENING 11/05/20   [name], MD    Family History  Problem Relation Age of Onset  . Hypertension Mother   . Heart disease Father 30  . Bone cancer Brother   . Hypertension Daughter   . Cancer Maternal Aunt 80       breast and ovary  . Breast cancer Maternal Aunt   . Pancreatic cancer Maternal Aunt   . Lymphoma Maternal Aunt      Social History   Tobacco Use  . Smoking status: Former Smoker    Packs/day: 0.25    Years: 5.00    Pack years: 1.25    Types: Cigarettes    Quit date: 07/24/1990    Years since quitting: 30.5  . Smokeless tobacco: Never Used  Substance Use Topics  . Alcohol use: Yes    Comment: Socially  . Drug use: No    Allergies as of 02/03/2021  . (No Known Allergies)    Review of Systems:    All systems reviewed and negative except where noted in HPI.   Physical Exam:  There were no vitals taken for this visit. No LMP recorded. Patient is postmenopausal. General:   Alert,  Well-developed, well-nourished, pleasant and cooperative in NAD Head:  Normocephalic and atraumatic. Eyes:  Sclera clear, no icterus.   Conjunctiva pink. Ears:  Normal auditory acuity. Neck:  Supple; no masses or thyromegaly. Lungs:  Respirations even and unlabored.  Clear throughout to auscultation.   No wheezes, crackles, or rhonchi. No acute distress. Heart:  Regular rate and rhythm; no murmurs, clicks, rubs, or gallops. Abdomen:  Normal bowel sounds.  No bruits.  Soft,  non-tender and non-distended without masses, hepatosplenomegaly or hernias noted.  No guarding or rebound tenderness.  Negative Carnett sign.   Rectal:  Deferred.  Pulses:  Normal pulses noted. Extremities:  No clubbing or edema.  No cyanosis. Neurologic:  Alert and oriented x3;  grossly normal neurologically. Skin:  Intact without significant lesions or rashes.  No jaundice. Lymph Nodes:  No significant cervical adenopathy. Psych:  Alert and cooperative. Normal mood and affect.  Imaging Studies: No results found.  Assessment and Plan:   Saryah Loper  Letendre is a 70 y.o. y/o female who comes in today with a history of fatty liver with increased alkaline phosphatase and also had a small cyst that was subcentimeter.  The patient has no elevation of her AST and ALT and has been told that weight loss could help with her fatty infiltration of her liver.  As for her elevated alkaline phosphatase her fractionation showed it to be from a liver source and her AMA was negative in addition to the alkaline phosphatase being less than 50% elevated above the upper limit of normal observation is recommended.  See diagram below.  The patient was found to have hepatitis B surface antibody negative and will have her blood checked for hepatitis a total.  If she is found to have susceptibility to hepatitis A in addition to her not being immune to hepatitis B she will need vaccinations for A and B or B alone depending on the results of her labs.  The patient will also be set up for an upper endoscopy due to her dysphagia and a history of esophageal stricture.  The patient has been explained the plan and agrees with it.      [name], MD. Marval Regal    Note: This dictation was prepared with Dragon dictation along with smaller phrase technology. Any transcriptional errors that result from this process are unintentional.

## 2021-02-04 ENCOUNTER — Telehealth: Payer: Self-pay

## 2021-02-04 LAB — HEPATITIS A ANTIBODY, TOTAL: hep A Total Ab: NEGATIVE

## 2021-02-04 NOTE — Telephone Encounter (Signed)
-----   Message from [name], MD sent at 02/04/2021 12:12 PM EST ----- Patient needs hep A and B vaccines

## 2021-02-04 NOTE — Telephone Encounter (Signed)
Pt notified of results through my chart

## 2021-02-09 NOTE — Assessment & Plan Note (Signed)
Right breast mass 9:00 position 1 cm from nipple: 4 mm; small asymmetry in the upper outer quadrant measuring 1 cm 04/19/2017:Right breast stereotactic biopsy: IDC with DCIS, biopsy 9:00 position: [facility], ER 100%, PR 100% HER-2Neg 05/21/17:Right lumpectomy: IDC grade 1, microscopic focus, low-grade DCIS, 0/3 lymph nodes negative, Atypical lobular hyperplasia, ER 100%, PR 100%, HER-2 negative ratio 1.63, Ki-67 insufficient, T82mc, N0 stage IA  Adj XRT 06/18/17- 07/17/17 Current Treatment: Anastrozole 1 mg daily Anastrozole toxicities: 1.Hot flashes: Much improved 2.Myalgias: No longer an issue  Breast cancer surveillance: 1.Mammogram 09/29/2020: Benign breast density category B 2.Breast exam 02/10/2021: Benign, scar tissue is the same as previous years.  Breast lymphedema: Much better  RTC in 1 year

## 2021-02-09 NOTE — Progress Notes (Signed)
Patient Care Team: [name], MD as PCP - General (Internal Medicine) [name], MD (General Surgery) [name], MD as Consulting Physician (Hematology and Oncology) Delice Bison, Charlestine Massed, NP as Nurse Practitioner (Hematology and Oncology) Kyung Rudd, MD as Consulting Physician (Radiation Oncology) [name], MD as Consulting Physician (General Surgery)  DIAGNOSIS:    ICD-10-CM   1. Malignant neoplasm of upper-outer quadrant of right breast in female, estrogen receptor positive ([place])  C50.411    Z17.0     SUMMARY OF ONCOLOGIC HISTORY: Oncology History  Malignant neoplasm of upper-outer quadrant of right breast in female, estrogen receptor positive ([place])  04/09/2017 Mammogram   Right breast mass 9:00 position 1 cm from nipple: 4 mm; small asymmetry in the upper outer quadrant measuring 1 cm   04/20/2017 Initial Diagnosis   Right breast stereotactic biopsy: IDC with DCIS, biopsy 9:00 position: [facility], ER 100%, PR 100% HER-2 pending   05/16/2017 Surgery   Right lumpectomy: IDC grade 1, microscopic focus, low-grade DCIS, 0/3 lymph nodes negative, Atypical lobular hyperplasia, ER 100%, PR 100%, HER-2 negative ratio 1.63, Ki-67 insufficient, T7mc, N0 stage IA   06/18/2017 - 07/17/2017 Radiation Therapy   Adjuvant radiation therapy   09/2017 -  Anti-estrogen oral therapy   Anastrozole 127mdaily     CHIEF COMPLIANT: Follow-up of right breast cancer on anastrozole therapy  INTERVAL HISTORY: DoJOLIANA CLAFLINs a [age].o. with above-mentioned history of right breast cancer treated with lumpectomy,adjuvant radiation, and whois currently on anastrozole therapy. Mammogram on 09/29/20 showed no evidence of malignancy bilaterally. She presents to the clinic today for follow-up  ALLERGIES:  has No Known Allergies.  MEDICATIONS:  Current Outpatient Medications  Medication Sig Dispense Refill  . anastrozole (ARIMIDEX) 1 MG tablet TAKE 1 TABLET BY MOUTH  DAILY  90 tablet 3  . aspirin 81 MG tablet Take 81 mg by mouth daily.    . cyclobenzaprine (FLEXERIL) 10 MG tablet TAKE 1 TABLET BY MOUTH 3  TIMES DAILY AS NEEDED FOR  MUSCLE SPASM(S) 90 tablet 1  . glucose blood test strip Use daily to test blood sugar E11.9 one touch verio 100 each 12  . metFORMIN (GLUCOPHAGE-XR) 750 MG 24 hr tablet TAKE 1 TABLET BY MOUTH  DAILY WITH BREAKFAST 90 tablet 3  . non-metallic deodorant (ALRA) MISC Apply 1 application topically.    . nystatin (MYCOSTATIN/NYSTOP) powder Apply 1 application topically 2 (two) times daily. To rash until resolved. 45 g 3  . simvastatin (ZOCOR) 40 MG tablet TAKE 1 TABLET BY MOUTH  DAILY 90 tablet 3  . telmisartan-hydrochlorothiazide (MICARDIS HCT) 80-25 MG tablet TAKE 1 TABLET BY MOUTH IN  THE EVENING 90 tablet 3   No current facility-administered medications for this visit.    PHYSICAL EXAMINATION: ECOG PERFORMANCE STATUS: 1 - Symptomatic but completely ambulatory  Vitals:   02/10/21 1137  BP: (!) 154/55  Pulse: 92  Resp: 17  Temp: (!) 97.5 F (36.4 C)  SpO2: 100%   Filed Weights   02/10/21 1137  Weight: 192 lb (87.1 kg)    BREAST: No palpable masses or nodules in either right or left breasts. No palpable axillary supraclavicular or infraclavicular adenopathy no breast tenderness or nipple discharge. (exam performed in the presence of a chaperone)  LABORATORY DATA:  I have reviewed the data as listed CMP Latest Ref Rng & Units 12/20/2020 12/06/2020 08/24/2020  Glucose 70 - 99 mg/dL 120(H) 142(H) 129(H)  BUN 6 - 23 mg/dL 24(H) 22 20  Creatinine 0.40 - 1.20 mg/dL 1.11 1.21(H) 1.08  Sodium 135 - 145 mEq/L 138 137 138  Potassium 3.5 - 5.1 mEq/L 4.5 4.3 4.5  Chloride 96 - 112 mEq/L 102 101 101  CO2 19 - 32 mEq/L '28 31 29  ' Calcium 8.4 - 10.5 mg/dL 10.0 9.9 9.7  Total Protein 6.0 - 8.3 g/dL - 6.9 -  Total Bilirubin 0.2 - 1.2 mg/dL - 0.5 -  Alkaline Phos 44 - 121 IU/L 131(H) 131(H) -  AST 0 - 37 U/L - 12 -  ALT 0 - 35 U/L - 8 -     Lab Results  Component Value Date   WBC 8.8 07/20/2016   HGB 12.1 07/20/2016   HCT 36.9 07/20/2016   MCV 91.7 07/20/2016   PLT 323.0 07/20/2016   NEUTROABS 6.0 07/20/2016    ASSESSMENT & PLAN:  Malignant neoplasm of upper-outer quadrant of right breast in female, estrogen receptor positive (HCC) Right breast mass 9:00 position 1 cm from nipple: 4 mm; small asymmetry in the upper outer quadrant measuring 1 cm 04/19/2017:Right breast stereotactic biopsy: IDC with DCIS, biopsy 9:00 position: ALH, ER 100%, PR 100% HER-2Neg 05/21/17:Right lumpectomy: IDC grade 1, microscopic focus, low-grade DCIS, 0/3 lymph nodes negative, Atypical lobular hyperplasia, ER 100%, PR 100%, HER-2 negative ratio 1.63, Ki-67 insufficient, T66mc, N0 stage IA  Adj XRT 06/18/17- 07/17/17  Current Treatment: Anastrozole 1 mg daily Anastrozole toxicities: 1.Hot flashes: Much improved 2.Myalgias: No longer an issue  Breast cancer surveillance: 1.Mammogram 09/29/2020: Benign breast density category B 2.Breast exam 02/10/2021: Benign, scar tissue is the same as previous years.  Breast lymphedema: Much better  RTC in 1 year    No orders of the defined types were placed in this encounter.  The patient has a good understanding of the overall plan. she agrees with it. she will call with any problems that may develop before the next visit here.  Total time spent: 20 mins including face to face time and time spent for planning, charting and coordination of care  VRulon Eisenmenger MD, MPH 02/10/2021  I, MCloyde ReamsDorshimer, am acting as scribe for Dr. VNicholas Lose  I have reviewed the above documentation for accuracy and completeness, and I agree with the above.

## 2021-02-10 ENCOUNTER — Other Ambulatory Visit: Payer: Self-pay

## 2021-02-10 ENCOUNTER — Inpatient Hospital Stay: Payer: Medicare Other | Attending: Hematology and Oncology | Admitting: Hematology and Oncology

## 2021-02-10 ENCOUNTER — Telehealth: Payer: Self-pay | Admitting: Hematology and Oncology

## 2021-02-10 DIAGNOSIS — C50411 Malignant neoplasm of upper-outer quadrant of right female breast: Secondary | ICD-10-CM | POA: Diagnosis not present

## 2021-02-10 DIAGNOSIS — Z79811 Long term (current) use of aromatase inhibitors: Secondary | ICD-10-CM | POA: Insufficient documentation

## 2021-02-10 DIAGNOSIS — I890 Lymphedema, not elsewhere classified: Secondary | ICD-10-CM | POA: Insufficient documentation

## 2021-02-10 DIAGNOSIS — Z170 Estrogen receptor positive status [ER+]: Secondary | ICD-10-CM | POA: Insufficient documentation

## 2021-02-10 MED ORDER — VITAMIN C 1000 MG PO TABS: 1000.0000 mg | ORAL_TABLET | Freq: Every day | ORAL | Status: AC

## 2021-02-10 MED ORDER — FOLIC ACID 1 MG PO TABS: 1.0000 mg | ORAL_TABLET | Freq: Every day | ORAL | Status: DC

## 2021-02-10 NOTE — Telephone Encounter (Signed)
Scheduled appts per 2/17 los. Gave pt a print out of AVS.

## 2021-02-16 ENCOUNTER — Other Ambulatory Visit: Payer: Self-pay

## 2021-02-16 ENCOUNTER — Encounter: Payer: Self-pay | Admitting: Gastroenterology

## 2021-02-17 ENCOUNTER — Other Ambulatory Visit
Admission: RE | Admit: 2021-02-17 | Discharge: 2021-02-17 | Disposition: A | Discharge status: Home / Self Care | Payer: Medicare Other | Source: Ambulatory Visit | Attending: Gastroenterology | Admitting: Gastroenterology

## 2021-02-17 DIAGNOSIS — Z01812 Encounter for preprocedural laboratory examination: Secondary | ICD-10-CM | POA: Insufficient documentation

## 2021-02-17 DIAGNOSIS — Z20822 Contact with and (suspected) exposure to covid-19: Secondary | ICD-10-CM | POA: Diagnosis not present

## 2021-02-18 LAB — SARS CORONAVIRUS 2 (TAT 6-24 HRS): SARS Coronavirus 2: NEGATIVE

## 2021-02-18 NOTE — Discharge Instructions (Signed)

## 2021-02-21 ENCOUNTER — Encounter: Payer: Self-pay | Admitting: Gastroenterology

## 2021-02-21 ENCOUNTER — Ambulatory Visit: Payer: Medicare Other | Admitting: Anesthesiology

## 2021-02-21 ENCOUNTER — Ambulatory Visit
Admission: RE | Admit: 2021-02-21 | Discharge: 2021-02-21 | Disposition: A | Discharge status: Home / Self Care | Payer: Medicare Other | Attending: Gastroenterology | Admitting: Gastroenterology

## 2021-02-21 ENCOUNTER — Other Ambulatory Visit: Payer: Self-pay

## 2021-02-21 ENCOUNTER — Encounter
Admission: RE | Disposition: A | Discharge status: Home / Self Care | Payer: Self-pay | Source: Home / Self Care | Attending: Gastroenterology

## 2021-02-21 DIAGNOSIS — K760 Fatty (change of) liver, not elsewhere classified: Secondary | ICD-10-CM | POA: Insufficient documentation

## 2021-02-21 DIAGNOSIS — K219 Gastro-esophageal reflux disease without esophagitis: Secondary | ICD-10-CM | POA: Insufficient documentation

## 2021-02-21 DIAGNOSIS — Z7982 Long term (current) use of aspirin: Secondary | ICD-10-CM | POA: Insufficient documentation

## 2021-02-21 DIAGNOSIS — Z79899 Other long term (current) drug therapy: Secondary | ICD-10-CM | POA: Insufficient documentation

## 2021-02-21 DIAGNOSIS — E119 Type 2 diabetes mellitus without complications: Secondary | ICD-10-CM | POA: Diagnosis not present

## 2021-02-21 DIAGNOSIS — C50911 Malignant neoplasm of unspecified site of right female breast: Secondary | ICD-10-CM | POA: Insufficient documentation

## 2021-02-21 DIAGNOSIS — Z7984 Long term (current) use of oral hypoglycemic drugs: Secondary | ICD-10-CM | POA: Diagnosis not present

## 2021-02-21 DIAGNOSIS — K449 Diaphragmatic hernia without obstruction or gangrene: Secondary | ICD-10-CM | POA: Diagnosis not present

## 2021-02-21 DIAGNOSIS — Z87891 Personal history of nicotine dependence: Secondary | ICD-10-CM | POA: Insufficient documentation

## 2021-02-21 DIAGNOSIS — R1310 Dysphagia, unspecified: Secondary | ICD-10-CM

## 2021-02-21 DIAGNOSIS — K222 Esophageal obstruction: Secondary | ICD-10-CM | POA: Diagnosis not present

## 2021-02-21 DIAGNOSIS — Z79811 Long term (current) use of aromatase inhibitors: Secondary | ICD-10-CM | POA: Diagnosis not present

## 2021-02-21 DIAGNOSIS — Z923 Personal history of irradiation: Secondary | ICD-10-CM | POA: Insufficient documentation

## 2021-02-21 HISTORY — PX: ESOPHAGOGASTRODUODENOSCOPY (EGD) WITH PROPOFOL: SHX5813

## 2021-02-21 HISTORY — DX: Fatty (change of) liver, not elsewhere classified: K76.0

## 2021-02-21 LAB — GLUCOSE, CAPILLARY
Glucose-Capillary: 104 mg/dL — ABNORMAL HIGH (ref 70–99)
Glucose-Capillary: 98 mg/dL (ref 70–99)

## 2021-02-21 SURGERY — ESOPHAGOGASTRODUODENOSCOPY (EGD) WITH PROPOFOL
Anesthesia: General | Site: Mouth

## 2021-02-21 MED ORDER — LACTATED RINGERS IV SOLN: INTRAVENOUS | Status: DC

## 2021-02-21 MED ORDER — STERILE WATER FOR IRRIGATION IR SOLN: Status: DC | PRN

## 2021-02-21 MED ORDER — GLYCOPYRROLATE 0.2 MG/ML IJ SOLN
INTRAMUSCULAR | Status: DC | PRN
  Administered 2021-02-21: .2 mg via INTRAVENOUS

## 2021-02-21 MED ORDER — PROPOFOL 10 MG/ML IV BOLUS
INTRAVENOUS | Status: DC | PRN
  Administered 2021-02-21: 20 mg via INTRAVENOUS
  Administered 2021-02-21: 50 mg via INTRAVENOUS
  Administered 2021-02-21: 100 mg via INTRAVENOUS

## 2021-02-21 MED ORDER — LIDOCAINE HCL (CARDIAC) PF 100 MG/5ML IV SOSY
PREFILLED_SYRINGE | INTRAVENOUS | Status: DC | PRN
  Administered 2021-02-21: 30 mg via INTRAVENOUS

## 2021-02-21 SURGICAL SUPPLY — 10 items
BALLN DILATOR 15-18 8 (BALLOONS) ×2
BALLOON DILATOR 15-18 8 (BALLOONS) ×1 IMPLANT
BLOCK BITE 60FR ADLT L/F GRN (MISCELLANEOUS) ×2 IMPLANT
GOWN CVR UNV OPN BCK APRN NK (MISCELLANEOUS) ×2 IMPLANT
GOWN ISOL THUMB LOOP REG UNIV (MISCELLANEOUS) ×4
KIT PRC NS LF DISP ENDO (KITS) ×1 IMPLANT
KIT PROCEDURE OLYMPUS (KITS) ×2
MANIFOLD NEPTUNE II (INSTRUMENTS) ×2 IMPLANT
SYR INFLATION 60ML (SYRINGE) ×2 IMPLANT
WATER STERILE IRR 250ML POUR (IV SOLUTION) ×2 IMPLANT

## 2021-02-21 NOTE — Anesthesia Postprocedure Evaluation (Signed)
Anesthesia Post Note  Patient: Martha [name]  Procedure(s) Performed: ESOPHAGOGASTRODUODENOSCOPY (EGD) WITH DILATION  (N/A Mouth)     Anesthesia Post Evaluation No complications documented.  Shawnia Vizcarrondo [employer]

## 2021-02-21 NOTE — Anesthesia Procedure Notes (Signed)
Procedure Name: MAC Performed by: Georga Bora, CRNA Pre-anesthesia Checklist: Patient identified, Emergency Drugs available, Suction available, Patient being monitored and Timeout performed Patient Re-evaluated:Patient Re-evaluated prior to induction Oxygen Delivery Method: Nasal cannula Placement Confirmation: positive ETCO2 and breath sounds checked- equal and bilateral

## 2021-02-21 NOTE — Transfer of Care (Signed)
Immediate Anesthesia Transfer of Care Note  Patient: Martha [name]  Procedure(s) Performed: ESOPHAGOGASTRODUODENOSCOPY (EGD) WITH DILATION  (N/A Mouth)  Patient Location: PACU  Anesthesia Type: General  Level of Consciousness: awake, alert  and patient cooperative  Airway and Oxygen Therapy: Patient Spontanous Breathing and Patient connected to supplemental oxygen  Post-op Assessment: Post-op Vital signs reviewed, Patient's Cardiovascular Status Stable, Respiratory Function Stable, Patent Airway and No signs of Nausea or vomiting  Post-op Vital Signs: Reviewed and stable  Complications: No complications documented.

## 2021-02-21 NOTE — Op Note (Signed)
[facility] Gastroenterology Patient Name: Martha [name] Procedure Date: 02/21/2021 8:11 AM MRN: [id number] Account #: [id number] Date of Birth: [birth year] Admit Type: Outpatient Age: 70 Room: [facility] - Eat OR ROOM 01 Gender: Female Note Status: Finalized Procedure:             Upper GI endoscopy Indications:           Dysphagia Providers:             [name] MD, MD Referring MD:          Deborra Medina, MD (Referring MD) Medicines:             Propofol per Anesthesia Complications:         No immediate complications. Procedure:             Pre-Anesthesia Assessment:                        - Prior to the procedure, a History and Physical was                         performed, and patient medications and allergies were                         reviewed. The patient's tolerance of previous                         anesthesia was also reviewed. The risks and benefits                         of the procedure and the sedation options and risks                         were discussed with the patient. All questions were                         answered, and informed consent was obtained. Prior                         Anticoagulants: The patient has taken no previous                         anticoagulant or antiplatelet agents. ASA Grade                         Assessment: II - A patient with mild systemic disease.                         After reviewing the risks and benefits, the patient                         was deemed in satisfactory condition to undergo the                         procedure.                        After obtaining informed consent, the endoscope was  passed under direct vision. Throughout the procedure,                         the patient's blood pressure, pulse, and oxygen                         saturations were monitored continuously. The Endoscope                         was introduced through the mouth, and advanced to the                          second part of duodenum. The upper GI endoscopy was                         accomplished without difficulty. The patient tolerated                         the procedure well. Findings:      A medium-sized hiatal hernia was present.      One benign-appearing, intrinsic mild stenosis was found at the       gastroesophageal junction. The stenosis was traversed. A TTS dilator was       passed through the scope. Dilation with a 15-16.5-18 mm balloon dilator       was performed to 18 mm. The dilation site was examined following       endoscope reinsertion and showed complete resolution of luminal       narrowing.      A large amount of food (residue) was found in the gastric body.      The examined duodenum was normal. Impression:            - Medium-sized hiatal hernia.                        - Benign-appearing esophageal stenosis. Dilated.                        - A large amount of food (residue) in the stomach.                        - Normal examined duodenum.                        - No specimens collected. Recommendation:        - Discharge patient to home.                        - Resume previous diet.                        - Continue present medications.                        - Do a gastric emptying study at appointment to be                         scheduled. Procedure Code(s):     --- Professional ---  [phone], Esophagogastroduodenoscopy, flexible,                         transoral; with transendoscopic balloon dilation of                         esophagus (less than 30 mm diameter) Diagnosis Code(s):     --- Professional ---                        R13.10, Dysphagia, unspecified                        K22.2, Esophageal obstruction CPT copyright 2019 American Medical Association. All rights reserved. The codes documented in this report are preliminary and upon coder review may  be revised to meet current compliance requirements. [name]  MD, MD 02/21/2021 8:26:03 AM This report has been signed electronically. Number of Addenda: 0 Note Initiated On: 02/21/2021 8:11 AM Total Procedure Duration: 0 hours 4 minutes 5 seconds  Estimated Blood Loss:  Estimated blood loss: none.      [facility]

## 2021-02-21 NOTE — Anesthesia Preprocedure Evaluation (Signed)
Anesthesia Evaluation  Patient identified by MRN, date of birth, ID band Patient awake    Reviewed: Allergy & Precautions, NPO status , Patient's Chart, lab work & pertinent test results  Airway Mallampati: II  TM Distance: >3 FB Neck ROM: Full    Dental no notable dental hx.    Pulmonary former smoker,    Pulmonary exam normal        Cardiovascular hypertension, Normal cardiovascular exam     Neuro/Psych  Headaches, negative psych ROS   GI/Hepatic GERD  Controlled,Dysphagia   Endo/Other  diabetes, Type 2BMI 35  Renal/GU CRFRenal disease     Musculoskeletal  (+) Arthritis , Osteoarthritis,    Abdominal (+) + obese,   Peds  Hematology negative hematology ROS (+)   Anesthesia Other Findings H/o breast cancer  Reproductive/Obstetrics                             Anesthesia Physical Anesthesia Plan  ASA: III  Anesthesia Plan: General   Post-op Pain Management:    Induction: Intravenous  PONV Risk Score and Plan: 3 and Propofol infusion, TIVA and Treatment may vary due to age or medical condition  Airway Management Planned: Natural Airway  Additional Equipment: None  Intra-op Plan:   Post-operative Plan:   Informed Consent: I have reviewed the patients History and Physical, chart, labs and discussed the procedure including the risks, benefits and alternatives for the proposed anesthesia with the patient or authorized representative who has indicated his/her understanding and acceptance.     Dental advisory given  Plan Discussed with: CRNA  Anesthesia Plan Comments:         Anesthesia Quick Evaluation

## 2021-02-21 NOTE — Interval H&P Note (Signed)
Martha Lame, MD [address] [place], [place] [address] [phone] Fax : [phone]  Primary Care Physician:  Martha Mc, MD Primary Gastroenterologist:  Dr. Allen Norris  Pre-Procedure History & Physical: HPI:  Martha [name] is a 70 y.o. female is here for an endoscopy.   Past Medical History:  Diagnosis Date   Arthritis    Atypical ductal hyperplasia of right breast 11/20/2016   Cancer ([place]) 04/2017   right breast cancer   Colon polyp 2008   Diabetes mellitus without complication ([place])    Endometrial hyperplasia 2013   Fatty liver    GERD (gastroesophageal reflux disease)    OCC-NO MEDS   Headache    H/O MIGRAINES   Hypertension    Papilloma 11/20/2016   right breast   Personal history of radiation therapy    2018   Status post dilation of esophageal narrowing 2008    Past Surgical History:  Procedure Laterality Date   BREAST BIOPSY Right 11/02/2016   ATYPICAL PAPILLARY LESION,   BREAST EXCISIONAL BIOPSY Right 11/20/2016   AHD   BREAST LUMPECTOMY Right 11/20/2016   Procedure: BREAST LUMPECTOMY;  Surgeon: Martha Lye, MD;  Location: [facility] ORS;  Service: General;  Laterality: Right;   BREAST LUMPECTOMY Right 04/2017   lumpectomy 2018, excision 2017   COLONOSCOPY  2008   in [place] Right 05/16/2017   Procedure: RIGHT RADIOACTIVE SEED GUIDED LUMPECTOMY  WITH  RIGHT BREAST SEED GUIDED EXCISIONAL BIOPSY AND RIGHT AXILLARY SENTINEL  NODE BIOPSY;  Surgeon: Martha Bookbinder, MD;  Location: [place];  Service: General;  Laterality: Right;  2 [place]    Prior to Admission medications   Medication Sig Start Date End Date Taking? Authorizing Provider  anastrozole (ARIMIDEX) 1 MG tablet TAKE 1 TABLET BY MOUTH  DAILY 12/22/20  Yes [name], MD   Ascorbic Acid (VITAMIN C) 1000 MG tablet Take 1 tablet (1,000 mg total) by mouth daily. 02/10/21  Yes [name], MD  aspirin 81 MG tablet Take 81 mg by mouth daily.   Yes [provider]  cholecalciferol (VITAMIN D) 25 MCG (1000 UNIT) tablet Take 1,000 Units by mouth daily.   Yes [provider]  cyclobenzaprine (FLEXERIL) 10 MG tablet TAKE 1 TABLET BY MOUTH 3  TIMES DAILY AS NEEDED FOR  MUSCLE SPASM(S) 11/12/20  Yes Martha Mc, MD  folic acid (FOLVITE) 1 MG tablet Take 1 tablet (1 mg total) by mouth daily. 02/10/21  Yes [name], MD  glucose blood test strip Use daily to test blood sugar E11.9 one touch verio 08/13/20  Yes Martha Mc, MD  metFORMIN (GLUCOPHAGE-XR) 750 MG 24 hr tablet TAKE 1 TABLET BY MOUTH  DAILY WITH BREAKFAST 10/01/20  Yes Martha Mc, MD  non-metallic deodorant Martha [name]) MISC Apply 1 application topically.   Yes [provider]  nystatin (MYCOSTATIN/NYSTOP) powder Apply 1 application topically 2 (two) times daily. To rash until resolved. 05/13/20  Yes Martha Mc, MD  simvastatin (ZOCOR) 40 MG tablet TAKE 1 TABLET BY MOUTH  DAILY 07/30/20  Yes Martha Mc, MD  telmisartan-hydrochlorothiazide (MICARDIS HCT) 80-25 MG tablet TAKE 1 TABLET BY MOUTH IN  THE EVENING 11/05/20  Yes Martha Mc, MD    Allergies as of 02/03/2021   (No Known Allergies)  Family History  Problem Relation Age of Onset   Hypertension Mother    Heart disease Father 69   Bone cancer Brother    Hypertension Daughter    Cancer Maternal Aunt 61       breast and ovary   Breast cancer Maternal Aunt    Pancreatic cancer Maternal Aunt    Lymphoma Maternal Aunt     Social History   Socioeconomic History   Marital status: Married    Spouse name: Not on file   Number of children: Not on file   Years of education: Not on file   Highest education level: Not on file  Occupational History   Not on file  Tobacco Use   Smoking status: Former Smoker     Packs/day: 0.25    Years: 5.00    Pack years: 1.25    Types: Cigarettes    Quit date: 07/24/1990    Years since quitting: 30.6   Smokeless tobacco: Never Used  Vaping Use   Vaping Use: Never used  Substance and Sexual Activity   Alcohol use: Yes    Comment: Socially   Drug use: No   Sexual activity: Not Currently  Other Topics Concern   Not on file  Social History Narrative   Lives in [place]. From [place]. Son lives with pt. Dog in home.      Work - Martha [name], and Theme park manager, now retired.      Diet - regular      Exercise - no regular   Social Determinants of Health   Financial Resource Strain: Low Risk    Difficulty of Paying Living Expenses: Not hard at all  Food Insecurity: No Food Insecurity   Worried About Charity fundraiser in the Last Year: Never true   Ran Out of Food in the Last Year: Never true  Transportation Needs: No Transportation Needs   Lack of Transportation (Medical): No   Lack of Transportation (Non-Medical): No  Physical Activity: Sufficiently Active   Days of Exercise per Week: 4 days   Minutes of Exercise per Session: 40 min  Stress: No Stress Concern Present   Feeling of Stress : Not at all  Social Connections: Unknown   Frequency of Communication with Friends and Family: More than three times a week   Frequency of Social Gatherings with Friends and Family: More than three times a week   Attends Religious Services: Not on Electrical engineer or Organizations: Not on file   Attends Archivist Meetings: Not on file   Marital Status: Married  Human resources officer Violence: Not At Risk   Fear of Current or Ex-Partner: No   Emotionally Abused: No   Physically Abused: No   Sexually Abused: No    Review of Systems: See HPI, otherwise negative ROS  Physical Exam: BP (!) 135/59   Temp (!) 96.8 F (36 C) (Temporal)   Ht 5\' 2"  (1.575 m)   Wt 87.5 kg   SpO2 98%   BMI 35.30 kg/m  General:   Alert,  pleasant and cooperative in  NAD Head:  Normocephalic and atraumatic. Neck:  Supple; no masses or thyromegaly. Lungs:  Clear throughout to auscultation.    Heart:  Regular rate and rhythm. Abdomen:  Soft, nontender and nondistended. Normal bowel sounds, without guarding, and without rebound.   Neurologic:  Alert and  oriented x4;  grossly normal neurologically.  Impression/Plan: BEATRYCE COLOMBO is here for an endoscopy to be performed  for dysphagia  Risks, benefits, limitations, and alternatives regarding  endoscopy have been reviewed with the patient.  Questions have been answered.  All parties agreeable.   Martha Lame, MD  02/21/2021, 8:11 AM

## 2021-03-23 ENCOUNTER — Other Ambulatory Visit: Payer: Self-pay

## 2021-03-23 DIAGNOSIS — K3184 Gastroparesis: Secondary | ICD-10-CM

## 2021-04-04 DIAGNOSIS — H524 Presbyopia: Secondary | ICD-10-CM | POA: Diagnosis not present

## 2021-04-04 DIAGNOSIS — Z961 Presence of intraocular lens: Secondary | ICD-10-CM | POA: Diagnosis not present

## 2021-04-04 DIAGNOSIS — E089 Diabetes mellitus due to underlying condition without complications: Secondary | ICD-10-CM | POA: Diagnosis not present

## 2021-04-04 DIAGNOSIS — H04123 Dry eye syndrome of bilateral lacrimal glands: Secondary | ICD-10-CM | POA: Diagnosis not present

## 2021-04-04 DIAGNOSIS — E119 Type 2 diabetes mellitus without complications: Secondary | ICD-10-CM | POA: Diagnosis not present

## 2021-04-04 LAB — HM DIABETES EYE EXAM

## 2021-04-15 ENCOUNTER — Other Ambulatory Visit: Payer: Self-pay

## 2021-04-15 ENCOUNTER — Ambulatory Visit
Admission: RE | Admit: 2021-04-15 | Discharge: 2021-04-15 | Disposition: A | Discharge status: Home / Self Care | Payer: Medicare Other | Source: Ambulatory Visit | Attending: Gastroenterology | Admitting: Gastroenterology

## 2021-04-15 DIAGNOSIS — Z0389 Encounter for observation for other suspected diseases and conditions ruled out: Secondary | ICD-10-CM | POA: Diagnosis not present

## 2021-04-15 DIAGNOSIS — K3184 Gastroparesis: Secondary | ICD-10-CM | POA: Insufficient documentation

## 2021-04-15 MED ORDER — TECHNETIUM TC 99M SULFUR COLLOID
1.9380 | Freq: Once | INTRAVENOUS | Status: AC | PRN
  Administered 2021-04-15: 1.938 via ORAL

## 2021-04-19 ENCOUNTER — Telehealth: Payer: Self-pay

## 2021-04-19 NOTE — Telephone Encounter (Signed)
-----   Message from [name], MD sent at 04/17/2021  6:24 PM EDT ----- Let the patient know that her gastric emptying study was normal.

## 2021-04-19 NOTE — Telephone Encounter (Signed)
Pt notified of results through my chart

## 2021-04-25 ENCOUNTER — Ambulatory Visit: Payer: Medicare Other

## 2021-04-25 DIAGNOSIS — Z23 Encounter for immunization: Secondary | ICD-10-CM

## 2021-05-30 ENCOUNTER — Other Ambulatory Visit: Payer: Self-pay

## 2021-05-30 ENCOUNTER — Other Ambulatory Visit (INDEPENDENT_AMBULATORY_CARE_PROVIDER_SITE_OTHER): Payer: Medicare Other

## 2021-05-30 ENCOUNTER — Ambulatory Visit (INDEPENDENT_AMBULATORY_CARE_PROVIDER_SITE_OTHER): Payer: Medicare Other | Admitting: Gastroenterology

## 2021-05-30 DIAGNOSIS — Z23 Encounter for immunization: Secondary | ICD-10-CM | POA: Diagnosis not present

## 2021-05-30 NOTE — Progress Notes (Signed)
Pt here today for Twinrix #2. Pt tolerated vaccine well.

## 2021-06-15 ENCOUNTER — Ambulatory Visit: Payer: Medicare Other | Admitting: Internal Medicine

## 2021-07-10 ENCOUNTER — Other Ambulatory Visit: Payer: Self-pay | Admitting: Internal Medicine

## 2021-07-28 ENCOUNTER — Encounter: Payer: Self-pay | Admitting: Internal Medicine

## 2021-07-28 ENCOUNTER — Ambulatory Visit
Admission: RE | Admit: 2021-07-28 | Discharge: 2021-07-28 | Disposition: A | Discharge status: Home / Self Care | Payer: Medicare Other | Attending: Internal Medicine | Admitting: Internal Medicine

## 2021-07-28 ENCOUNTER — Ambulatory Visit
Admission: RE | Admit: 2021-07-28 | Discharge: 2021-07-28 | Disposition: A | Discharge status: Home / Self Care | Payer: Medicare Other | Source: Ambulatory Visit | Attending: Internal Medicine | Admitting: Internal Medicine

## 2021-07-28 ENCOUNTER — Ambulatory Visit (INDEPENDENT_AMBULATORY_CARE_PROVIDER_SITE_OTHER): Payer: Medicare Other | Admitting: Internal Medicine

## 2021-07-28 ENCOUNTER — Other Ambulatory Visit: Payer: Self-pay

## 2021-07-28 VITALS — BP 106/60 | HR 75 | Temp 96.2°F | Ht 62.0 in | Wt 190.6 lb

## 2021-07-28 DIAGNOSIS — M545 Low back pain, unspecified: Secondary | ICD-10-CM | POA: Diagnosis not present

## 2021-07-28 DIAGNOSIS — R1031 Right lower quadrant pain: Secondary | ICD-10-CM

## 2021-07-28 DIAGNOSIS — M47816 Spondylosis without myelopathy or radiculopathy, lumbar region: Secondary | ICD-10-CM

## 2021-07-28 DIAGNOSIS — M25551 Pain in right hip: Secondary | ICD-10-CM

## 2021-07-28 DIAGNOSIS — M1610 Unilateral primary osteoarthritis, unspecified hip: Secondary | ICD-10-CM | POA: Diagnosis not present

## 2021-07-28 NOTE — Progress Notes (Signed)
Chief Complaint  Patient presents with   Leg Pain    Right thigh   Acute  Right groin pain worse with bending  x 8 days lifting and bending working at [employer] was 10/10 last night 8/10 today tried heat, biofreeze, voltaren gel, 2 excedrin with some relief declines strong pain med for now Pain is slowing walking down  H/o breast cancer per pt in remission Review of Systems  Respiratory:  Negative for shortness of breath.   Cardiovascular:  Negative for chest pain.  Musculoskeletal:  Positive for joint pain. Negative for falls.  Past Medical History:  Diagnosis Date   Arthritis    Atypical ductal hyperplasia of right breast 11/20/2016   Cancer ([place]) 04/2017   right breast cancer   Colon polyp 2008   Diabetes mellitus without complication ([place])    Endometrial hyperplasia 2013   Fatty liver    GERD (gastroesophageal reflux disease)    OCC-NO MEDS   Headache    H/O MIGRAINES   Hypertension    Papilloma 11/20/2016   right breast   Personal history of radiation therapy    2018   Status post dilation of esophageal narrowing 2008   Past Surgical History:  Procedure Laterality Date   BREAST BIOPSY Right 11/02/2016   ATYPICAL PAPILLARY LESION,   BREAST EXCISIONAL BIOPSY Right 11/20/2016   AHD   BREAST LUMPECTOMY Right 11/20/2016   Procedure: BREAST LUMPECTOMY;  Surgeon: [name], MD;  Location: [facility] ORS;  Service: General;  Laterality: Right;   BREAST LUMPECTOMY Right 04/2017   lumpectomy 2018, excision 2017   COLONOSCOPY  2008   in [place]     ESOPHAGOGASTRODUODENOSCOPY (EGD) WITH PROPOFOL N/A 02/21/2021   Procedure: ESOPHAGOGASTRODUODENOSCOPY (EGD) WITH DILATION ;  Surgeon: [name], MD;  Location: [place];  Service: Endoscopy;  Laterality: N/A;  Diabetic - oral meds   RADIOACTIVE SEED GUIDED PARTIAL MASTECTOMY WITH AXILLARY SENTINEL LYMPH NODE BIOPSY Right 05/16/2017   Procedure: RIGHT RADIOACTIVE SEED GUIDED  LUMPECTOMY  WITH  RIGHT BREAST SEED GUIDED EXCISIONAL BIOPSY AND RIGHT AXILLARY SENTINEL  NODE BIOPSY;  Surgeon: [name], MD;  Location: [place];  Service: General;  Laterality: Right;  2 SEEDS RIGHT BREAST  GENERAL AND PEC BLOCK ANESHTESIA   Family History  Problem Relation Age of Onset   Hypertension Mother    Heart disease Father 76   Bone cancer Brother    Hypertension Daughter    Cancer Maternal Aunt 61       breast and ovary   Breast cancer Maternal Aunt    Pancreatic cancer Maternal Aunt    Lymphoma Maternal Aunt    Social History   Socioeconomic History   Marital status: Married    Spouse name: Not on file   Number of children: Not on file   Years of education: Not on file   Highest education level: Not on file  Occupational History   Not on file  Tobacco Use   Smoking status: Former    Packs/day: 0.25    Years: 5.00    Pack years: 1.25    Types: Cigarettes    Quit date: 07/24/1990    Years since quitting: 31.0   Smokeless tobacco: Never  Vaping Use   Vaping Use: Never used  Substance and Sexual Activity   Alcohol use: Yes    Comment: Socially   Drug use: No   Sexual activity: Not Currently  Other  Topics Concern   Not on file  Social History Narrative   Lives in [place]. From [place]. Son lives with pt. Dog in home.      Work - [employer], and Theme park manager, now retired.      Diet - regular      Exercise - no regular   Social Determinants of Health   Financial Resource Strain: Low Risk    Difficulty of Paying Living Expenses: Not hard at all  Food Insecurity: No Food Insecurity   Worried About Charity fundraiser in the Last Year: Never true   Ran Out of Food in the Last Year: Never true  Transportation Needs: No Transportation Needs   Lack of Transportation (Medical): No   Lack of Transportation (Non-Medical): No  Physical Activity: Sufficiently Active   Days of Exercise per Week: 4 days   Minutes of Exercise per Session: 40  min  Stress: No Stress Concern Present   Feeling of Stress : Not at all  Social Connections: Unknown   Frequency of Communication with Friends and Family: More than three times a week   Frequency of Social Gatherings with Friends and Family: More than three times a week   Attends Religious Services: Not on Electrical engineer or Organizations: Not on file   Attends Archivist Meetings: Not on file   Marital Status: Married  Human resources officer Violence: Not At Risk   Fear of Current or Ex-Partner: No   Emotionally Abused: No   Physically Abused: No   Sexually Abused: No   Current Meds  Medication Sig   anastrozole (ARIMIDEX) 1 MG tablet TAKE 1 TABLET BY MOUTH  DAILY   aspirin 81 MG tablet Take 81 mg by mouth daily.   cholecalciferol (VITAMIN D) 25 MCG (1000 UNIT) tablet Take 1,000 Units by mouth daily.   cyclobenzaprine (FLEXERIL) 10 MG tablet TAKE 1 TABLET BY MOUTH 3  TIMES DAILY AS NEEDED FOR  MUSCLE SPASM(S)   glucose blood test strip Use daily to test blood sugar E11.9 one touch verio   metFORMIN (GLUCOPHAGE-XR) 750 MG 24 hr tablet TAKE 1 TABLET BY MOUTH  DAILY WITH BREAKFAST   non-metallic deodorant (ALRA) MISC Apply 1 application topically.   simvastatin (ZOCOR) 40 MG tablet TAKE 1 TABLET BY MOUTH  DAILY   telmisartan-hydrochlorothiazide (MICARDIS HCT) 80-25 MG tablet TAKE 1 TABLET BY MOUTH IN  THE EVENING   No Known Allergies No results found for this or any previous visit (from the past 2160 hour(s)). Objective  Body mass index is 34.86 kg/m. Wt Readings from Last 3 Encounters:  07/28/21 190 lb 9.6 oz (86.5 kg)  02/21/21 193 lb (87.5 kg)  02/10/21 192 lb (87.1 kg)   Temp Readings from Last 3 Encounters:  07/28/21 (!) 96.2 F (35.7 C) (Temporal)  02/21/21 (!) 97.2 F (36.2 C)  02/10/21 (!) 97.5 F (36.4 C) (Temporal)   BP Readings from Last 3 Encounters:  07/28/21 106/60  02/21/21 (!) 119/54  02/10/21 (!) 154/55   Pulse Readings from Last 3  Encounters:  07/28/21 75  02/21/21 94  02/10/21 92    Physical Exam Vitals and nursing note reviewed.  Constitutional:      Appearance: Normal appearance. She is well-developed and well-groomed. She is obese.  HENT:     Head: Normocephalic and atraumatic.  Eyes:     Conjunctiva/sclera: Conjunctivae normal.     Pupils: Pupils are equal, round, and reactive to light.  Cardiovascular:  Rate and Rhythm: Normal rate and regular rhythm.     Heart sounds: Normal heart sounds.  Pulmonary:     Effort: Pulmonary effort is normal.     Breath sounds: Normal breath sounds.  Musculoskeletal:     Right hip: Tenderness present.  Skin:    General: Skin is warm and dry.  Neurological:     General: No focal deficit present.     Mental Status: She is alert and oriented to person, place, and time. Mental status is at baseline.     Gait: Gait normal.  Psychiatric:        Attention and Perception: Attention and perception normal.        Mood and Affect: Mood and affect normal.        Speech: Speech normal.        Behavior: Behavior normal. Behavior is cooperative.        Thought Content: Thought content normal.        Cognition and Memory: Cognition and memory normal.        Judgment: Judgment normal.   Assessment  Plan  Right groin pain -r/o arthritis vs h/o breast cancer consider bone mets?   Plan: DG HIP UNILAT WITH PELVIS MIN 4 VIEWS RIGHT, CANCELED: DG HIP UNILAT WITH PELVIS MIN 4 VIEWS RIGHT Right hip pain - Plan: DG HIP UNILAT WITH PELVIS MIN 4 VIEWS RIGHT, CANCELED: DG HIP UNILAT WITH PELVIS MIN 4 VIEWS RIGHT -refer to [facility] for now tx consider PT  -xray normal consider CT hip vs bone scan  Declines meds for mpain   Acute low back pain, unspecified back pain laterality, unspecified whether sciatica present - Plan: DG Lumbar Spine Complete, DG HIP UNILAT WITH PELVIS MIN 4 VIEWS RIGHT  Provider: Dr. Olivia Mackie McLean-Scocuzza-Internal Medicine

## 2021-07-28 NOTE — Patient Instructions (Addendum)
Lidocaine pain patches  Tylenol arthritis  Aspercream with liodocaine   Low Back Sprain or Strain Rehab Ask your health care provider which exercises are safe for you. Do exercises exactly as told by your health care provider and adjust them as directed. It is normal to feel mild stretching, pulling, tightness, or discomfort as you do these exercises. Stop right away if you feel sudden pain or your pain gets worse. Do not begin these exercises until told by your health care provider. Stretching and range-of-motion exercises These exercises warm up your muscles and joints and improve the movement and flexibility of your back. These exercises also help to relieve pain, numbness,and tingling. Lumbar rotation  Lie on your back on a firm surface and bend your knees. Straighten your arms out to your sides so each arm forms a 90-degree angle (right angle) with a side of your body. Slowly move (rotate) both of your knees to one side of your body until you feel a stretch in your lower back (lumbar). Try not to let your shoulders lift off the floor. Hold this position for __________ seconds. Tense your abdominal muscles and slowly move your knees back to the starting position. Repeat this exercise on the other side of your body. Repeat __________ times. Complete this exercise __________ times a day. Single knee to chest  Lie on your back on a firm surface with both legs straight. Bend one of your knees. Use your hands to move your knee up toward your chest until you feel a gentle stretch in your lower back and buttock. Hold your leg in this position by holding on to the front of your knee. Keep your other leg as straight as possible. Hold this position for __________ seconds. Slowly return to the starting position. Repeat with your other leg. Repeat __________ times. Complete this exercise __________ times a day. Prone extension on elbows  Lie on your abdomen on a firm surface (prone  position). Prop yourself up on your elbows. Use your arms to help lift your chest up until you feel a gentle stretch in your abdomen and your lower back. This will place some of your body weight on your elbows. If this is uncomfortable, try stacking pillows under your chest. Your hips should stay down, against the surface that you are lying on. Keep your hip and back muscles relaxed. Hold this position for __________ seconds. Slowly relax your upper body and return to the starting position. Repeat __________ times. Complete this exercise __________ times a day. Strengthening exercises These exercises build strength and endurance in your back. Endurance is theability to use your muscles for a long time, even after they get tired. Pelvic tilt This exercise strengthens the muscles that lie deep in the abdomen. Lie on your back on a firm surface. Bend your knees and keep your feet flat on the floor. Tense your abdominal muscles. Tip your pelvis up toward the ceiling and flatten your lower back into the floor. To help with this exercise, you may place a small towel under your lower back and try to push your back into the towel. Hold this position for __________ seconds. Let your muscles relax completely before you repeat this exercise. Repeat __________ times. Complete this exercise __________ times a day. Alternating arm and leg raises  Get on your hands and knees on a firm surface. If you are on a hard floor, you may want to use padding, such as an exercise mat, to cushion your knees. Line up  your arms and legs. Your hands should be directly below your shoulders, and your knees should be directly below your hips. Lift your left leg behind you. At the same time, raise your right arm and straighten it in front of you. Do not lift your leg higher than your hip. Do not lift your arm higher than your shoulder. Keep your abdominal and back muscles tight. Keep your hips facing the ground. Do not  arch your back. Keep your balance carefully, and do not hold your breath. Hold this position for __________ seconds. Slowly return to the starting position. Repeat with your right leg and your left arm. Repeat __________ times. Complete this exercise __________ times a day. Abdominal set with straight leg raise  Lie on your back on a firm surface. Bend one of your knees and keep your other leg straight. Tense your abdominal muscles and lift your straight leg up, 4-6 inches (10-15 cm) off the ground. Keep your abdominal muscles tight and hold this position for __________ seconds. Do not hold your breath. Do not arch your back. Keep it flat against the ground. Keep your abdominal muscles tense as you slowly lower your leg back to the starting position. Repeat with your other leg. Repeat __________ times. Complete this exercise __________ times a day. Single leg lower with bent knees Lie on your back on a firm surface. Tense your abdominal muscles and lift your feet off the floor, one foot at a time, so your knees and hips are bent in 90-degree angles (right angles). Your knees should be over your hips and your lower legs should be parallel to the floor. Keeping your abdominal muscles tense and your knee bent, slowly lower one of your legs so your toe touches the ground. Lift your leg back up to return to the starting position. Do not hold your breath. Do not let your back arch. Keep your back flat against the ground. Repeat with your other leg. Repeat __________ times. Complete this exercise __________ times a day. Posture and body mechanics Good posture and healthy body mechanics can help to relieve stress in your body's tissues and joints. Body mechanics refers to the movements and positions of your body while you do your daily activities. Posture is part of body mechanics. Good posture means: Your spine is in its natural S-curve position (neutral). Your shoulders are pulled back  slightly. Your head is not tipped forward. Follow these guidelines to improve your posture and body mechanics in youreveryday activities. Standing  When standing, keep your spine neutral and your feet about hip width apart. Keep a slight bend in your knees. Your ears, shoulders, and hips should line up. When you do a task in which you stand in one place for a long time, place one foot up on a stable object that is 2-4 inches (5-10 cm) high, such as a footstool. This helps keep your spine neutral.  Sitting  When sitting, keep your spine neutral and keep your feet flat on the floor. Use a footrest, if necessary, and keep your thighs parallel to the floor. Avoid rounding your shoulders, and avoid tilting your head forward. When working at a desk or a computer, keep your desk at a height where your hands are slightly lower than your elbows. Slide your chair under your desk so you are close enough to maintain good posture. When working at a computer, place your monitor at a height where you are looking straight ahead and you do not have to  tilt your head forward or downward to look at the screen.  Resting When lying down and resting, avoid positions that are most painful for you. If you have pain with activities such as sitting, bending, stooping, or squatting, lie in a position in which your body does not bend very much. For example, avoid curling up on your side with your arms and knees near your chest (fetal position). If you have pain with activities such as standing for a long time or reaching with your arms, lie with your spine in a neutral position and bend your knees slightly. Try the following positions: Lying on your side with a pillow between your knees. Lying on your back with a pillow under your knees. Lifting  When lifting objects, keep your feet at least shoulder width apart and tighten your abdominal muscles. Bend your knees and hips and keep your spine neutral. It is important to  lift using the strength of your legs, not your back. Do not lock your knees straight out. Always ask for help to lift heavy or awkward objects.  This information is not intended to replace advice given to you by your health care provider. Make sure you discuss any questions you have with your healthcare provider. Document Revised: 04/04/2019 Document Reviewed: 01/02/2019 Else[employer] Patient Education  [place].  Hip Exercises Ask your health care provider which exercises are safe for you. Do exercises exactly as told by your health care provider and adjust them as directed. It is normal to feel mild stretching, pulling, tightness, or discomfort as you do these exercises. Stop right away if you feel sudden pain or your pain gets worse. Do not begin these exercises until told by your health care provider. Stretching and range-of-motion exercises These exercises warm up your muscles and joints and improve the movement and flexibility of your hip. These exercises also help to relieve pain, numbness, and tingling. You may be asked to limit your range of motion if you had a hipreplacement. Talk to your health care provider about these restrictions. Hamstrings, supine  Lie on your back (supine position). Loop a belt or towel over the ball of your left / right foot. The ball of your foot is on the walking surface, right under your toes. Straighten your left / right knee and slowly pull on the belt or towel to raise your leg until you feel a gentle stretch behind your knee (hamstring). Do not let your knee bend while you do this. Keep your other leg flat on the floor. Hold this position for __________ seconds. Slowly return your leg to the starting position. Repeat __________ times. Complete this exercise __________ times a day. Hip rotation  Lie on your back on a firm surface. With your left / right hand, gently pull your left / right knee toward the shoulder that is on the same side of the  body. Stop when your knee is pointing toward the ceiling. Hold your left / right ankle with your other hand. Keeping your knee steady, gently pull your left / right ankle toward your other shoulder until you feel a stretch in your buttocks. Keep your hips and shoulders firmly planted while you do this stretch. Hold this position for __________ seconds. Repeat __________ times. Complete this exercise __________ times a day. Seated stretch This exercise is sometimes called hamstrings and adductors stretch. Sit on the floor with your legs stretched wide. Keep your knees straight during this exercise. Keeping your head and back in a  straight line, bend at your waist to reach for your left foot (position A). You should feel a stretch in your right inner thigh (adductors). Hold this position for __________ seconds. Then slowly return to the upright position. Keeping your head and back in a straight line, bend at your waist to reach forward (position B). You should feel a stretch behind both of your thighs and knees (hamstrings). Hold this position for __________ seconds. Then slowly return to the upright position. Keeping your head and back in a straight line, bend at your waist to reach for your right foot (position C). You should feel a stretch in your left inner thigh (adductors). Hold this position for __________ seconds. Then slowly return to the upright position. Repeat __________ times. Complete this exercise __________ times a day. Lunge This exercise stretches the muscles of the hip (hip flexors). Place your left / right knee on the floor and bend your other knee so that is directly over your ankle. You should be half-kneeling. Keep good posture with your head over your shoulders. Tighten your buttocks to point your tailbone downward. This will prevent your back from arching too much. You should feel a gentle stretch in the front of your left / right thigh and hip. If you do not feel a  stretch, slide your other foot forward slightly and then slowly lunge forward with your chest up until your knee once again lines up over your ankle. Make sure your tailbone continues to point downward. Hold this position for __________ seconds. Slowly return to the starting position. Repeat __________ times. Complete this exercise __________ times a day. Strengthening exercises These exercises build strength and endurance in your hip. Endurance is theability to use your muscles for a long time, even after they get tired. Bridge This exercise strengthens the muscles of your hip (hip extensors). Lie on your back on a firm surface with your knees bent and your feet flat on the floor. Tighten your buttocks muscles and lift your bottom off the floor until the trunk of your body and your hips are level with your thighs. Do not arch your back. You should feel the muscles working in your buttocks and the back of your thighs. If you do not feel these muscles, slide your feet 1-2 inches (2.5-5 cm) farther away from your buttocks. Hold this position for __________ seconds. Slowly lower your hips to the starting position. Let your muscles relax completely between repetitions. Repeat __________ times. Complete this exercise __________ times a day. Straight leg raises, side-lying This exercise strengthens the muscles that move the hip joint away from the center of the body (hip abductors). Lie on your side with your left / right leg in the top position. Lie so your head, shoulder, hip, and knee line up. You may bend your bottom knee slightly to help you balance. Roll your hips slightly forward, so your hips are stacked directly over each other and your left / right knee is facing forward. Leading with your heel, lift your top leg 4-6 inches (10-15 cm). You should feel the muscles in your top hip lifting. Do not let your foot drift forward. Do not let your knee roll toward the ceiling. Hold this position  for __________ seconds. Slowly return to the starting position. Let your muscles relax completely between repetitions. Repeat __________ times. Complete this exercise __________ times a day. Straight leg raises, side-lying This exercise strengthens the muscles that move the hip joint toward the center of the body (  hip adductors). Lie on your side with your left / right leg in the bottom position. Lie so your head, shoulder, hip, and knee line up. You may place your upper foot in front to help you balance. Roll your hips slightly forward, so your hips are stacked directly over each other and your left / right knee is facing forward. Tense the muscles in your inner thigh and lift your bottom leg 4-6 inches (10-15 cm). Hold this position for __________ seconds. Slowly return to the starting position. Let your muscles relax completely between repetitions. Repeat __________ times. Complete this exercise __________ times a day. Straight leg raises, supine This exercise strengthens the muscles in the front of your thigh (quadriceps). Lie on your back (supine position) with your left / right leg extended and your other knee bent. Tense the muscles in the front of your left / right thigh. You should see your kneecap slide up or see increased dimpling just above your knee. Keep these muscles tight as you raise your leg 4-6 inches (10-15 cm) off the floor. Do not let your knee bend. Hold this position for __________ seconds. Keep these muscles tense as you lower your leg. Relax the muscles slowly and completely between repetitions. Repeat __________ times. Complete this exercise __________ times a day. Hip abductors, standing This exercise strengthens the muscles that move the leg and hip joint away from the center of the body (hip abductors). Tie one end of a rubber exercise band or tubing to a secure surface, such as a chair, table, or pole. Loop the other end of the band or tubing around your left /  right ankle. Keeping your ankle with the band or tubing directly opposite the secured end, step away until there is tension in the tubing or band. Hold on to a chair, table, or pole as needed for balance. Lift your left / right leg out to your side. While you do this: Keep your back upright. Keep your shoulders over your hips. Keep your toes pointing forward. Make sure to use your hip muscles to slowly lift your leg. Do not tip your body or forcefully lift your leg. Hold this position for __________ seconds. Slowly return to the starting position. Repeat __________ times. Complete this exercise __________ times a day. Squats This exercise strengthens the muscles in the front of your thigh (quadriceps). Stand in a door frame so your feet and knees are in line with the frame. You may place your hands on the frame for balance. Slowly bend your knees and lower your hips like you are going to sit in a chair. Keep your lower legs in a straight-up-and-down position. Do not let your hips go lower than your knees. Do not bend your knees lower than told by your health care provider. If your hip pain increases, do not bend as low. Hold this position for ___________ seconds. Slowly push with your legs to return to standing. Do not use your hands to pull yourself to standing. Repeat __________ times. Complete this exercise __________ times a day. This information is not intended to replace advice given to you by your health care provider. Make sure you discuss any questions you have with your healthcare provider. Document Revised: 07/17/2019 Document Reviewed: 10/22/2018 Elsevier Patient Education  2022 [employer].

## 2021-07-28 NOTE — Addendum Note (Signed)
Addended by: Elpidio Galea T on: 07/28/2021 10:27 AM   Modules accepted: Orders

## 2021-07-29 ENCOUNTER — Ambulatory Visit: Payer: Medicare Other | Admitting: Internal Medicine

## 2021-07-30 ENCOUNTER — Encounter: Payer: Self-pay | Admitting: Internal Medicine

## 2021-08-01 DIAGNOSIS — M47816 Spondylosis without myelopathy or radiculopathy, lumbar region: Secondary | ICD-10-CM | POA: Insufficient documentation

## 2021-08-01 DIAGNOSIS — M1610 Unilateral primary osteoarthritis, unspecified hip: Secondary | ICD-10-CM | POA: Insufficient documentation

## 2021-08-01 MED ORDER — TRAMADOL HCL 50 MG PO TABS: 50.0000 mg | ORAL_TABLET | Freq: Two times a day (BID) | ORAL | 0 refills | Status: AC | PRN

## 2021-08-01 NOTE — Addendum Note (Signed)
Addended by: Orland Mustard on: 08/01/2021 02:00 PM   Modules accepted: Orders

## 2021-08-08 ENCOUNTER — Telehealth: Payer: Self-pay | Admitting: Internal Medicine

## 2021-08-08 DIAGNOSIS — M1610 Unilateral primary osteoarthritis, unspecified hip: Secondary | ICD-10-CM

## 2021-08-08 NOTE — Telephone Encounter (Signed)
Rejection Reason - Patient did not respond - We have contacted this patient 2 times, left 2 messages and mailed a letter. This patient has not contacted [place] back to schedule. Referral is being closed due to time sensitivity but pt has our info to call and schedule. We will still be happy to assist your office and the patient. Thank you!" Martha [name] said on Aug 08, 2021 9:18 AM  "Thank you for this referral. We have spoken with this patient and she stated she would call us back to schedule. We will keep you updated on the status." Martha [name] said on Jul 28, 2021 12:23 PM  Msg from [facility]

## 2021-08-22 DIAGNOSIS — M545 Low back pain, unspecified: Secondary | ICD-10-CM | POA: Diagnosis not present

## 2021-08-22 DIAGNOSIS — M67834 Other specified disorders of tendon, left wrist: Secondary | ICD-10-CM | POA: Diagnosis not present

## 2021-08-22 DIAGNOSIS — M25551 Pain in right hip: Secondary | ICD-10-CM | POA: Insufficient documentation

## 2021-08-23 ENCOUNTER — Telehealth: Payer: Self-pay | Admitting: Internal Medicine

## 2021-08-23 NOTE — Telephone Encounter (Signed)
Patient called in stating she received a call from our office but is unsure who called,no outstanding labs or messages.Please advise and call the patient with more information.

## 2021-08-24 NOTE — Telephone Encounter (Signed)
Spoke with pt to let her know that I am unsure of why she was called and don't see anything in the chart as to why she would have been called. Pt gave a verbal understanding.

## 2021-09-02 ENCOUNTER — Other Ambulatory Visit: Payer: Self-pay | Admitting: Internal Medicine

## 2021-09-12 DIAGNOSIS — M25551 Pain in right hip: Secondary | ICD-10-CM | POA: Diagnosis not present

## 2021-09-12 DIAGNOSIS — M5416 Radiculopathy, lumbar region: Secondary | ICD-10-CM | POA: Insufficient documentation

## 2021-09-12 DIAGNOSIS — M545 Low back pain, unspecified: Secondary | ICD-10-CM | POA: Diagnosis not present

## 2021-09-29 ENCOUNTER — Other Ambulatory Visit: Payer: Self-pay

## 2021-09-29 ENCOUNTER — Ambulatory Visit (INDEPENDENT_AMBULATORY_CARE_PROVIDER_SITE_OTHER): Payer: Self-pay | Admitting: Gastroenterology

## 2021-09-29 DIAGNOSIS — Z23 Encounter for immunization: Secondary | ICD-10-CM | POA: Diagnosis not present

## 2021-09-30 DIAGNOSIS — M545 Low back pain, unspecified: Secondary | ICD-10-CM | POA: Diagnosis not present

## 2021-09-30 DIAGNOSIS — M5416 Radiculopathy, lumbar region: Secondary | ICD-10-CM | POA: Diagnosis not present

## 2021-09-30 DIAGNOSIS — M25551 Pain in right hip: Secondary | ICD-10-CM | POA: Diagnosis not present

## 2021-10-03 ENCOUNTER — Other Ambulatory Visit (INDEPENDENT_AMBULATORY_CARE_PROVIDER_SITE_OTHER): Payer: Medicare Other

## 2021-10-03 DIAGNOSIS — Z23 Encounter for immunization: Secondary | ICD-10-CM | POA: Diagnosis not present

## 2021-10-05 ENCOUNTER — Ambulatory Visit: Payer: Medicare Other | Admitting: Internal Medicine

## 2021-10-07 ENCOUNTER — Other Ambulatory Visit: Payer: Self-pay | Admitting: Internal Medicine

## 2021-10-11 DIAGNOSIS — M25551 Pain in right hip: Secondary | ICD-10-CM | POA: Diagnosis not present

## 2021-10-11 DIAGNOSIS — M545 Low back pain, unspecified: Secondary | ICD-10-CM | POA: Diagnosis not present

## 2021-10-11 DIAGNOSIS — M5416 Radiculopathy, lumbar region: Secondary | ICD-10-CM | POA: Diagnosis not present

## 2021-11-02 ENCOUNTER — Encounter: Payer: Self-pay | Admitting: Internal Medicine

## 2021-11-02 ENCOUNTER — Other Ambulatory Visit: Payer: Self-pay

## 2021-11-02 ENCOUNTER — Ambulatory Visit (INDEPENDENT_AMBULATORY_CARE_PROVIDER_SITE_OTHER): Payer: Medicare Other

## 2021-11-02 ENCOUNTER — Ambulatory Visit (INDEPENDENT_AMBULATORY_CARE_PROVIDER_SITE_OTHER): Payer: Medicare Other | Admitting: Internal Medicine

## 2021-11-02 VITALS — Ht 62.0 in | Wt 190.0 lb

## 2021-11-02 VITALS — BP 130/72 | HR 68 | Temp 96.3°F | Ht 62.0 in | Wt 187.8 lb

## 2021-11-02 DIAGNOSIS — Z0000 Encounter for general adult medical examination without abnormal findings: Secondary | ICD-10-CM | POA: Diagnosis not present

## 2021-11-02 DIAGNOSIS — R5383 Other fatigue: Secondary | ICD-10-CM | POA: Insufficient documentation

## 2021-11-02 DIAGNOSIS — Z23 Encounter for immunization: Secondary | ICD-10-CM

## 2021-11-02 DIAGNOSIS — K760 Fatty (change of) liver, not elsewhere classified: Secondary | ICD-10-CM | POA: Diagnosis not present

## 2021-11-02 DIAGNOSIS — E7800 Pure hypercholesterolemia, unspecified: Secondary | ICD-10-CM

## 2021-11-02 DIAGNOSIS — N182 Chronic kidney disease, stage 2 (mild): Secondary | ICD-10-CM

## 2021-11-02 DIAGNOSIS — I10 Essential (primary) hypertension: Secondary | ICD-10-CM | POA: Diagnosis not present

## 2021-11-02 DIAGNOSIS — E1121 Type 2 diabetes mellitus with diabetic nephropathy: Secondary | ICD-10-CM | POA: Diagnosis not present

## 2021-11-02 DIAGNOSIS — E1122 Type 2 diabetes mellitus with diabetic chronic kidney disease: Secondary | ICD-10-CM

## 2021-11-02 NOTE — Progress Notes (Addendum)
Subjective:   Martha [name] is a 70 y.o. female who presents for Medicare Annual (Subsequent) preventive examination.  Review of Systems    No ROS.  Medicare Wellness Virtual Visit.  Visual/audio telehealth visit, UTA vital signs.   See social history for additional risk factors.   Cardiac Risk Factors include: advanced age (>35men, >72 women)     Objective:    Today's Vitals   11/02/21 0947  Weight: 190 lb (86.2 kg)  Height: 5\' 2"  (1.575 m)   Body mass index is 34.75 kg/m.  Advanced Directives 11/02/2021 02/21/2021 11/01/2020 10/30/2019 10/03/2018 02/06/2018 10/02/2017  Does Patient Have a Medical Advance Directive? No No Yes Yes No No No  Type of Advance Directive - Public librarian;Living will [place];Living will - - -  Does patient want to make changes to medical advance directive? - - No - Patient declined No - Patient declined - - Yes (MAU/Ambulatory/Procedural Areas - Information given)  Copy of [place] in Chart? - - No - copy requested No - copy requested - - -  Would patient like information on creating a medical advance directive? No - Patient declined Yes (MAU/Ambulatory/Procedural Areas - Information given) - - No - Patient declined No - Patient declined -    Current Medications (verified) Outpatient Encounter Medications as of 11/02/2021  Medication Sig   anastrozole (ARIMIDEX) 1 MG tablet TAKE 1 TABLET BY MOUTH  DAILY   aspirin 81 MG tablet Take 81 mg by mouth daily.   cholecalciferol (VITAMIN D) 25 MCG (1000 UNIT) tablet Take 1,000 Units by mouth daily.   cyclobenzaprine (FLEXERIL) 10 MG tablet TAKE 1 TABLET BY MOUTH 3  TIMES DAILY AS NEEDED FOR  MUSCLE SPASM(S)   glucose blood test strip Use daily to test blood sugar E11.9 one touch verio   metFORMIN (GLUCOPHAGE-XR) 750 MG 24 hr tablet TAKE 1 TABLET BY MOUTH  DAILY WITH BREAKFAST   non-metallic deodorant (ALRA) MISC Apply 1 application topically.    simvastatin (ZOCOR) 40 MG tablet TAKE 1 TABLET BY MOUTH  DAILY   telmisartan-hydrochlorothiazide (MICARDIS HCT) 80-25 MG tablet TAKE 1 TABLET BY MOUTH IN  THE EVENING   Ascorbic Acid (VITAMIN C) 1000 MG tablet Take 1 tablet (1,000 mg total) by mouth daily. (Patient not taking: Reported on 08/29/6212)   folic acid (FOLVITE) 1 MG tablet Take 1 tablet (1 mg total) by mouth daily. (Patient not taking: Reported on 07/28/2021)   nystatin (MYCOSTATIN/NYSTOP) powder Apply 1 application topically 2 (two) times daily. To rash until resolved. (Patient not taking: Reported on 07/28/2021)   No facility-administered encounter medications on file as of 11/02/2021.    Allergies (verified) Patient has no known allergies.   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   Cancer ([place]) 04/2017   right breast cancer   Colon polyp 2008   Diabetes mellitus without complication [facility])    Endometrial hyperplasia 2013   Fatty liver    GERD (gastroesophageal reflux disease)    OCC-NO MEDS   Headache    H/O MIGRAINES   Hypertension    Papilloma 11/20/2016   right breast   Personal history of radiation therapy    2018   Status post dilation of esophageal narrowing 2008   Past Surgical History:  Procedure Laterality Date   BREAST BIOPSY Right 11/02/2016   ATYPICAL PAPILLARY LESION,   BREAST EXCISIONAL BIOPSY  Right 11/20/2016   AHD   BREAST LUMPECTOMY Right 11/20/2016   Procedure: BREAST LUMPECTOMY;  Surgeon: [name], MD;  Location: [facility];  Service: General;  Laterality: Right;   BREAST LUMPECTOMY Right 04/2017   lumpectomy 2018, excision 2017   COLONOSCOPY  2008   in [place]     ESOPHAGOGASTRODUODENOSCOPY (EGD) WITH PROPOFOL N/A 02/21/2021   Procedure: ESOPHAGOGASTRODUODENOSCOPY (EGD) WITH DILATION ;  Surgeon: [name], MD;  Location: [place];  Service: Endoscopy;   Laterality: N/A;  Diabetic - oral meds   RADIOACTIVE SEED GUIDED PARTIAL MASTECTOMY WITH AXILLARY SENTINEL LYMPH NODE BIOPSY Right 05/16/2017   Procedure: RIGHT RADIOACTIVE SEED GUIDED LUMPECTOMY  WITH  RIGHT BREAST SEED GUIDED EXCISIONAL BIOPSY AND RIGHT AXILLARY SENTINEL  NODE BIOPSY;  Surgeon: [name], MD;  Location: [place];  Service: General;  Laterality: Right;  2 SEEDS RIGHT BREAST  GENERAL AND PEC BLOCK ANESHTESIA   Family History  Problem Relation Age of Onset   Hypertension Mother    Heart disease Father 87   Bone cancer Brother    Hypertension Daughter    Cancer Maternal Aunt 31       breast and ovary   Breast cancer Maternal Aunt    Pancreatic cancer Maternal Aunt    Lymphoma Maternal Aunt    Social History   Socioeconomic History   Marital status: Married    Spouse name: Not on file   Number of children: Not on file   Years of education: Not on file   Highest education level: Not on file  Occupational History   Not on file  Tobacco Use   Smoking status: Former    Packs/day: 0.25    Years: 5.00    Pack years: 1.25    Types: Cigarettes    Quit date: 07/24/1990    Years since quitting: 31.2   Smokeless tobacco: Never  Vaping Use   Vaping Use: Never used  Substance and Sexual Activity   Alcohol use: Yes    Comment: Socially   Drug use: No   Sexual activity: Not Currently  Other Topics Concern   Not on file  Social History Narrative   Lives in [place]. From [place]. Son lives with pt. Dog in home.      Work - [employer], and Theme park manager, now retired.      Diet - regular      Exercise - no regular   Social Determinants of Health   Financial Resource Strain: Low Risk    Difficulty of Paying Living Expenses: Not hard at all  Food Insecurity: No Food Insecurity   Worried About Charity fundraiser in the Last Year: Never true   Ran Out of Food in the Last Year: Never true  Transportation Needs: No Transportation Needs   Lack of  Transportation (Medical): No   Lack of Transportation (Non-Medical): No  Physical Activity: Insufficiently Active   Days of Exercise per Week: 3 days   Minutes of Exercise per Session: 40 min  Stress: No Stress Concern Present   Feeling of Stress : Not at all  Social Connections: Unknown   Frequency of Communication with Friends and Family: More than three times a week   Frequency of Social Gatherings with Friends and Family: More than three times a week   Attends Religious Services: Not on file   Active Member of Clubs or Organizations: Not on file   Attends [employer]  or Organization Meetings: Not on file   Marital Status: Married    Tobacco Counseling Counseling given: Not Answered   Clinical Intake:  Pre-visit preparation completed: Yes    Nutrition Risk Assessment: Does the patient have any non-healing wounds?  No  Has the patient had any unintentional weight loss or weight gain?  No   Diabetes: How often do you monitor your CBG's? Every other day.  Diabetes Management: Does the patient want to be seen by Chronic Care Management for management of their diabetes?  No  Would the patient like to be referred to a Nutritionist or for Diabetic Management?  No    Diabetes: Yes (Followed by pcp)  How often do you need to have someone help you when you read instructions, pamphlets, or other written materials from your doctor or pharmacy?: 1 - Never    Interpreter Needed?: No    Activities of Daily Living In your present state of health, do you have any difficulty performing the following activities: 11/02/2021 02/21/2021  Hearing? N N  Vision? N N  Difficulty concentrating or making decisions? N N  Walking or climbing stairs? N N  Dressing or bathing? N N  Doing errands, shopping? N -  Preparing Food and eating ? N -  Using the Toilet? N -  In the past six months, have you accidently leaked urine? N -  Do you have problems with loss of bowel control? N -  Managing your  Medications? N -  Managing your Finances? N -  Housekeeping or managing your Housekeeping? N -  Some recent data might be hidden    Patient Care Team: [name], MD as PCP - General (Internal Medicine) [name], MD (General Surgery) [name], MD as Consulting Physician (Hematology and Oncology) Delice Bison, Charlestine Massed, NP as Nurse Practitioner (Hematology and Oncology) Kyung Rudd, MD as Consulting Physician (Radiation Oncology) [name], MD as Consulting Physician (General Surgery)  Indicate any recent Medical Services you may have received from other than [facility] providers in the past year (date may be approximate).     Assessment:   This is a routine wellness examination for Bridgette.  Virtual Visit via Telephone Note  I connected with  FAWNE HUGHLEY on 11/02/21 at  9:45 AM EST by telephone and verified that I am speaking with the correct person using two identifiers.  Location: Patient: home Provider: office Persons participating in the virtual visit: patient/Nurse Health Advisor   I discussed the limitations, risks, security and privacy concerns of performing an evaluation and management service by telephone and the availability of in person appointments. The patient expressed understanding and agreed to proceed.  Interactive audio and video telecommunications were attempted between this nurse and patient, however failed, due to patient having technical difficulties OR patient did not have access to video capability.  We continued and completed visit with audio only.  Some vital signs may be absent or patient reported.   OBrien-Blaney, Helen Cuff L, LPN   Hearing/Vision screen Hearing Screening - Comments:: Patient is able to hear conversational tones without difficulty.  No issues reported. Vision Screening - Comments:: Followed by [facility] Wears corrective lenses No retinopathy reported  Cataract extraction, bilateral  They have  regular follow up with the ophthalmologist  Dietary issues and exercise activities discussed: Current Exercise Habits: Home exercise routine, Type of exercise: walking, Intensity: Mild Healthy diet Good water intake    Goals Addressed  This Visit's Progress     Patient Stated     Increase physical activity (pt-stated)        Begin water aerobics and silver sneaker program Weight loss goal 170lb      Other     Increase water intake   On track      Depression Screen PHQ 2/9 Scores 11/02/2021 11/01/2020 10/30/2019 07/17/2019 10/03/2018 10/02/2017 09/20/2016  PHQ - 2 Score 0 0 0 0 0 0 0  PHQ- 9 Score - - - - - 0 -    Fall Risk Fall Risk  11/02/2021 07/28/2021 12/29/2020 12/06/2020 11/01/2020  Falls in the past year? 0 0 0 0 0  Number falls in past yr: 0 0 - - 0  Injury with Fall? - 0 - - -  Risk for fall due to : - No Fall Risks - - -  Follow up Falls evaluation completed Falls evaluation completed Falls evaluation completed Falls evaluation completed Falls evaluation completed    [place]: Adequate lighting in your home to reduce risk of falls? Yes   ASSISTIVE DEVICES UTILIZED TO PREVENT FALLS: Use of a cane, walker or w/c? No   TIMED UP AND GO: Was the test performed? No . Virtual visit  Cognitive Function: MMSE - Mini Mental State Exam 10/03/2018 10/02/2017 09/20/2016  Orientation to time 5 5 5   Orientation to Place 5 5 5   Registration 3 3 3   Attention/ Calculation 5 5 5   Recall 3 3 3   Language- name 2 objects 2 2 2   Language- repeat 1 1 1   Language- follow 3 step command 3 3 3   Language- read & follow direction 1 1 1   Write a sentence 1 1 1   Copy design 1 1 1   Total score 30 30 30      6CIT Screen 11/01/2020 10/30/2019  What Year? 0 points 0 points  What month? 0 points 0 points  What time? 0 points 0 points  Count back from 20 0 points 0 points  Months in reverse 0 points 0 points  Repeat phrase 0 points 0 points   Total Score 0 0    Immunizations Immunization History  Administered Date(s) Administered   Fluad Quad(high Dose 65+) 09/11/2019, 10/06/2020, 09/19/2021   Hep A / Hep B 04/25/2021, 05/30/2021, 09/29/2021   Influenza Split 10/15/2014   Influenza, High Dose Seasonal PF 10/03/2018   Influenza,inj,Quad PF,6+ Mos 09/20/2016   Influenza-Unspecified 10/24/2012, 10/12/2013, 10/01/2017   PFIZER(Purple Top)SARS-COV-2 Vaccination 02/06/2020, 03/02/2020, 10/08/2020   Pfizer Covid-19 Vaccine Bivalent Booster 6yrs & up 09/19/2021   Pneumococcal Conjugate-13 06/21/2015, 08/25/2017   Pneumococcal Polysaccharide-23 10/24/2012   Tdap 07/24/2009, 10/10/2019, 10/31/2019   Pneumococcal vaccine status: Due, Education has been provided regarding the importance of this vaccine. Advised may receive this vaccine at local pharmacy or Health Dept. Aware to provide a copy of the vaccination record if obtained from local pharmacy or Health Dept. Verbalized acceptance and understanding.  Shingrix Completed?: No.    Education has been provided regarding the importance of this vaccine. Patient has been advised to call insurance company to determine out of pocket expense if they have not yet received this vaccine. Advised may also receive vaccine at local pharmacy or Health Dept. Verbalized acceptance and understanding.  Screening Tests Health Maintenance  Topic Date Due   Pneumonia Vaccine 8+ Years old (3 - PPSV23 if available, else PCV20) 08/25/2018   FOOT EXAM  10/12/2020   HEMOGLOBIN A1C  06/06/2021  Zoster Vaccines- Shingrix (1 of 2) 02/02/2022 (Originally 06/05/1970)   OPHTHALMOLOGY EXAM  04/04/2022   MAMMOGRAM  09/29/2022   Fecal DNA (Cologuard)  08/26/2023   TETANUS/TDAP  10/30/2029   INFLUENZA VACCINE  Completed   DEXA SCAN  Completed   COVID-19 Vaccine  Completed   HPV VACCINES  Aged Out    Health Maintenance Health Maintenance Due  Topic Date Due   Pneumonia Vaccine 75+ Years old (66 - PPSV23 if  available, else PCV20) 08/25/2018   FOOT EXAM  10/12/2020   HEMOGLOBIN A1C  06/06/2021   Lung Cancer Screening: (Low Dose CT Chest recommended if Age 30-80 years, 30 pack-year currently smoking OR have quit w/in 15years.) does not qualify.   Vision Screening: Recommended annual ophthalmology exams for early detection of glaucoma and other disorders of the eye.  Dental Screening: Recommended annual dental exams for proper oral hygiene  Foot exam- followed by pcp. Deferred.  A1C- labs followed by pcp.  PNA vaccine- due. Plans to receive in office today.   Community Resource Referral / Chronic Care Management: CRR required this visit?  No   CCM required this visit?  No      Plan:   Keep all routine maintenance appointments.   I have personally reviewed and noted the following in the patient's chart:   Medical and social history Use of alcohol, tobacco or illicit drugs  Current medications and supplements including opioid prescriptions. Not taking any opioid.  Functional ability and status Nutritional status Physical activity Advanced directives List of other physicians Hospitalizations, surgeries, and ER visits in previous 12 months Vitals Screenings to include cognitive, depression, and falls Referrals and appointments  In addition, I have reviewed and discussed with patient certain preventive protocols, quality metrics, and best practice recommendations. A written personalized care plan for preventive services as well as general preventive health recommendations were provided to patient.     OBrien-Blaney, Chelsei Mcchesney L, LPN   23/02/4355      I have reviewed the above information and agree with above.   Deborra Medina, MD

## 2021-11-02 NOTE — Patient Instructions (Addendum)
You can take 1350 mg of tylenol every night  . That is the maximal daily dose    I am  recommending that you consider adding  mounjaro   to help you lose weight  .  Theis medications are taken as a weekly subcutaneous injection. They are not  insulin.   They cause  your pancreas to increase its  own insulin secretion  And also slows down the emptying of your stomach,  So it decreases your appetite and helps you lose weight.   You can read about Mounjaro and obtain the $25 copay card on their website: Mounjaro.com  (THEY WILL ASK YOU IF YOU HAVE DIABETES IN ORDER TO GET THE COUPON)  Mounjaro is a GLP-1 receptor agonist.  It is a medication that is taken as a weekly subcutaneous injection. It is not insulin.  It  causes your pancreas to increase its  own insulin secretion  And also slows down the emptying of your stomach,  So it decreases your appetite and helps you lose weight. For people with diabetes,  the decreased appetite results in lower blood sugars.   The dose for the first 4 weekly doses is 2.5 mg .  You may have mild nausea on the first or second day but this should resolve.  If not  ,  stop the medication.   As long as you are losing weight,  you can continue the dose you are on .  Only increase the dose to  5.0 mg  after 4 weeks if your weight has plateaued.  Let me know when you need a refill and what dose you want filled

## 2021-11-02 NOTE — Assessment & Plan Note (Signed)
Glycemic control remains at goal with Metformin XR . Patient is up-to-date on eye exams and foot exam is normal today. Patient has microalbuminuria, is tolerating statin therapy for CAD risk reduction and ARB for renal protection and hypertension.    Discussed adding GLP  1 agonist for effect on weight  Lab Results  Component Value Date   HGBA1C 6.7 (H) 12/06/2020   Lab Results  Component Value Date   MICROALBUR 1.0 05/13/2020

## 2021-11-02 NOTE — Assessment & Plan Note (Signed)
Well controlled on current regimen. Renal function stable, no changes today. 

## 2021-11-02 NOTE — Progress Notes (Addendum)
Subjective:  Patient ID: Martha [name], female    DOB: August 26, 1951  Age: 70 y.o. MRN: [id number]  CC: The primary encounter diagnosis was Controlled type 2 diabetes mellitus with diabetic nephropathy, without long-term current use of insulin (Martha [name]). Diagnoses of Pure hypercholesterolemia, Need for pneumococcal vaccination, Essential hypertension, benign, Fatty liver, Caregiver with fatigue, and CKD stage 2 due to type 2 diabetes mellitus (Martha [name]) were also pertinent to this visit.  HPI [name] presents for follow up on multiple issues  Chief Complaint  Patient presents with   Follow-up    Follow up on diabetes, hypertension   This visit occurred during the SARS-CoV-2 public health emergency.  Safety protocols were in place, including screening questions prior to the visit, additional usage of staff PPE, and extensive cleaning of exam room while observing appropriate contact time as indicated for disinfecting solutions.   1) reveiwed recent referral to [facility] for evaluation of left leg pain that occurs  only at night when she Is supine .  sometimes  starts at the hip laterally , sometimes at the knee  ,  all the way down . Had Ortho eval and PT sessions x 4 appts which helped.     2) Fatty liver: suggested by ultrasound.  She was referred to dr Allen Norris .  Repeat workup was done to rule out other causes and she received vaccinations  for hepatitis A and B by Dr Allen Norris  . Discussed fatty liver  3) T2DM:  she feels generally well, is walking three times per week and checking blood sugars once daily at variable times.  BS have been under 130 fasting and < 150 post prandially.  Denies any recent hypoglyemic events.  Taking her medications as directed. Following a carbohydrate modified diet 6 days per week. Denies numbness, burning and tingling of extremities. Appetite is good.   reviewed her metformin concerns: ('does metformin cause death?"),  unable to lose signficant weight.  Discussed  use of GLP 1 agonists and screened for contraindications   4) Caregiver fatigue : Concerned about  her mother's cognitive decline Martha [name],)  has been developing paranoia.  No longer cooks , writes checks to [employer], feels she has to supervise mother  and husband I, has very little time to herself.  Advised her to consdier getting a  companion for mother through "[employer]'  and/or  enrolling mother in [employer].    Outpatient Medications Prior to Visit  Medication Sig Dispense Refill   anastrozole (ARIMIDEX) 1 MG tablet TAKE 1 TABLET BY MOUTH  DAILY 90 tablet 3   aspirin 81 MG tablet Take 81 mg by mouth daily.     cholecalciferol (VITAMIN D) 25 MCG (1000 UNIT) tablet Take 1,000 Units by mouth daily.     cyclobenzaprine (FLEXERIL) 10 MG tablet TAKE 1 TABLET BY MOUTH 3  TIMES DAILY AS NEEDED FOR  MUSCLE SPASM(S) 90 tablet 1   glucose blood test strip Use daily to test blood sugar E11.9 one touch verio 100 each 12   metFORMIN (GLUCOPHAGE-XR) 750 MG 24 hr tablet TAKE 1 TABLET BY MOUTH  DAILY WITH BREAKFAST 90 tablet 3   non-metallic deodorant (ALRA) MISC Apply 1 application topically.     telmisartan-hydrochlorothiazide (MICARDIS HCT) 80-25 MG tablet TAKE 1 TABLET BY MOUTH IN  THE EVENING 90 tablet 0   simvastatin (ZOCOR) 40 MG tablet TAKE 1 TABLET BY MOUTH  DAILY 90 tablet 3   Ascorbic Acid (VITAMIN C) 1000  MG tablet Take 1 tablet (1,000 mg total) by mouth daily. (Patient not taking: No sig reported)     folic acid (FOLVITE) 1 MG tablet Take 1 tablet (1 mg total) by mouth daily. (Patient not taking: No sig reported)     nystatin (MYCOSTATIN/NYSTOP) powder Apply 1 application topically 2 (two) times daily. To rash until resolved. (Patient not taking: No sig reported) 45 g 3   No facility-administered medications prior to visit.    Review of Systems;  Patient denies headache, fevers, malaise, unintentional weight loss, skin rash, eye pain, sinus congestion and  sinus pain, sore throat, dysphagia,  hemoptysis , cough, dyspnea, wheezing, chest pain, palpitations, orthopnea, edema, abdominal pain, nausea, melena, diarrhea, constipation, flank pain, dysuria, hematuria, urinary  Frequency, nocturia, numbness, tingling, seizures,  Focal weakness, Loss of consciousness,  Tremor, insomnia, depression, anxiety, and suicidal ideation.      Objective:  BP 130/72 (BP Location: Left Arm, Patient Position: Sitting, Cuff Size: Normal)   Pulse 68   Temp (!) 96.3 F (35.7 C) (Temporal)   Ht 5\' 2"  (1.575 m)   Wt 187 lb 12.8 oz (85.2 kg)   SpO2 99%   BMI 34.35 kg/m   BP Readings from Last 3 Encounters:  11/02/21 130/72  07/28/21 106/60  02/21/21 (!) 119/54    Wt Readings from Last 3 Encounters:  11/02/21 187 lb 12.8 oz (85.2 kg)  11/02/21 190 lb (86.2 kg)  07/28/21 190 lb 9.6 oz (86.5 kg)    General appearance: alert, cooperative and appears stated age Ears: normal TM's and external ear canals both ears Throat: lips, mucosa, and tongue normal; teeth and gums normal Neck: no adenopathy, no carotid bruit, supple, symmetrical, trachea midline and thyroid not enlarged, symmetric, no tenderness/mass/nodules Back: symmetric, no curvature. ROM normal. No CVA tenderness. Lungs: clear to auscultation bilaterally Heart: regular rate and rhythm, S1, S2 normal, no murmur, click, rub or gallop Abdomen: soft, non-tender; bowel sounds normal; no masses,  no organomegaly Pulses: 2+ and symmetric Skin: Skin color, texture, turgor normal. No rashes or lesions Lymph nodes: Cervical, supraclavicular, and axillary nodes normal.  Lab Results  Component Value Date   HGBA1C 6.2 (A) 11/03/2021   HGBA1C 6.7 (H) 12/06/2020   HGBA1C 6.9 (H) 08/13/2020    Lab Results  Component Value Date   CREATININE 1.12 11/02/2021   CREATININE 1.11 12/20/2020   CREATININE 1.21 (H) 12/06/2020    Lab Results  Component Value Date   WBC 8.8 07/20/2016   HGB 12.1 07/20/2016   HCT  36.9 07/20/2016   PLT 323.0 07/20/2016   GLUCOSE 95 11/02/2021   CHOL 195 11/02/2021   TRIG 98.0 11/02/2021   HDL 71.10 11/02/2021   LDLDIRECT 71.0 09/14/2017   LDLCALC 104 (H) 11/02/2021   ALT 8 11/02/2021   AST 14 11/02/2021   NA 140 11/02/2021   K 4.2 11/02/2021   CL 101 11/02/2021   CREATININE 1.12 11/02/2021   BUN 23 11/02/2021   CO2 30 11/02/2021   TSH 1.20 11/02/2021   HGBA1C 6.2 (A) 11/03/2021   MICROALBUR <0.7 11/02/2021    DG Lumbar Spine Complete  Result Date: 07/29/2021 CLINICAL DATA:  Low back pain for several days EXAM: LUMBAR SPINE - COMPLETE 4+ VIEW COMPARISON:  None. FINDINGS: Five lumbar type vertebral bodies are well visualized. Vertebral body height is well maintained. No anterolisthesis is noted. Mild osteophytic changes are seen as well as facet hypertrophic changes. No pars defects are noted. Scattered large and small bowel gas  is seen. No soft tissue abnormality is noted. IMPRESSION: Mild degenerative change without acute abnormality. Electronically Signed   By: Inez Catalina M.D.   On: 07/29/2021 20:13   DG Hip Unilat W OR W/O Pelvis 2-3 Views Right  Result Date: 07/29/2021 CLINICAL DATA:  Low back pain and right hip pain for several days, initial encounter EXAM: DG HIP (WITH OR WITHOUT PELVIS) 3V RIGHT COMPARISON:  None. FINDINGS: Pelvic ring is intact. Degenerative changes of the hip joints are noted bilaterally. No acute fracture or dislocation is seen. No soft tissue abnormality is noted. IMPRESSION: Degenerative change without acute abnormality. Electronically Signed   By: Inez Catalina M.D.   On: 07/29/2021 20:12    Assessment & Plan:   Problem List Items Addressed This Visit     Controlled type 2 diabetes mellitus with diabetic nephropathy, without long-term current use of insulin ([place]) - Primary     Glycemic control remains at goal with Metformin XR . Patient is up-to-date on eye exams and foot exam is normal today. Patient has microalbuminuria, is  tolerating statin therapy for CAD risk reduction and ARB for renal protection and hypertension.    Discussed adding GLP  1 agonist for effect on weight  Lab Results  Component Value Date   HGBA1C 6.7 (H) 12/06/2020   Lab Results  Component Value Date   MICROALBUR 1.0 05/13/2020         Relevant Medications   atorvastatin (LIPITOR) 20 MG tablet   Other Relevant Orders   Microalbumin / creatinine urine ratio (Completed)   Comprehensive metabolic panel (Completed)   Lipid panel (Completed)   POCT HgB A1C (Completed)   Hyperlipidemia    LDL has risen on 40 mg simvastatin.  Changing to atorvastatin.   . Lab Results  Component Value Date   CHOL 195 11/02/2021   HDL 71.10 11/02/2021   LDLCALC 104 (H) 11/02/2021   LDLDIRECT 71.0 09/14/2017   TRIG 98.0 11/02/2021   CHOLHDL 3 11/02/2021         Relevant Medications   atorvastatin (LIPITOR) 20 MG tablet   Other Relevant Orders   TSH (Completed)   Essential hypertension, benign    Well controlled on current regimen. Renal function stable, no changes today.       Relevant Medications   atorvastatin (LIPITOR) 20 MG tablet   CKD stage 2 due to type 2 diabetes mellitus ([facility])    GFR has been < 60 ml/min for the past year. Patient advised;  Nephrology referral offered.  Advised to restrict use of NSAIDs to prn  Not more than 2 times per week and to use tylenol        Relevant Medications   atorvastatin (LIPITOR) 20 MG tablet   Fatty liver    Presumed by ultrasound changes and serologies negative for autoimmune causes of hepatitis.  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   encouaraged her to find an aide for mother       Other Visit Diagnoses     Need for pneumococcal vaccination  Relevant Orders   Pneumococcal conjugate vaccine 20-valent (Prevnar 20) (Completed)       I have discontinued Daisa Stennis. Diamant's simvastatin. I am also having her start on atorvastatin. Additionally, I am having her maintain her aspirin, non-metallic deodorant, nystatin, glucose blood, cyclobenzaprine, anastrozole, vitamin C, folic acid, cholecalciferol, metFORMIN, and telmisartan-hydrochlorothiazide.  Meds ordered this encounter  Medications   atorvastatin (LIPITOR) 20 MG tablet    Sig: Take 1 tablet (20 mg total) by mouth daily.    Dispense:  90 tablet    Refill:  3     Medications Discontinued During This Encounter  Medication Reason   simvastatin (ZOCOR) 40 MG tablet     Follow-up: Return in about 6 months (around 05/02/2022) for follow up diabetes.   [name], MD

## 2021-11-02 NOTE — Assessment & Plan Note (Signed)
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  encouaraged her to find an aide for mother

## 2021-11-02 NOTE — Patient Instructions (Addendum)
Martha [name] , Thank you for taking time to come for your Medicare Wellness Visit. I appreciate your ongoing commitment to your health goals. Please review the following plan we discussed and let me know if I can assist you in the future.   These are the goals we discussed:  Goals       Patient Stated     Increase physical activity (pt-stated)      Begin water aerobics and silver sneaker program Weight loss goal 170lb      Other     Follow up with Primary Care Provider      As needed      Increase water intake        This is a list of the screening recommended for you and due dates:  Health Maintenance  Topic Date Due   Pneumonia Vaccine (3 - PPSV23 if available, else PCV20) 08/25/2018   Complete foot exam   10/12/2020   Hemoglobin A1C  06/06/2021   Zoster (Shingles) Vaccine (1 of 2) 02/02/2022*   Eye exam for diabetics  04/04/2022   Mammogram  09/29/2022   Cologuard (Stool DNA test)  08/26/2023   Tetanus Vaccine  10/30/2029   Flu Shot  Completed   DEXA scan (bone density measurement)  Completed   COVID-19 Vaccine  Completed   HPV Vaccine  Aged Out  *Topic was postponed. The date shown is not the original due date.    Advanced directives: not yet completed  Conditions/risks identified: none new  Follow up in one year for your annual wellness visit    Preventive Care 65 Years and Older, Female Preventive care refers to lifestyle choices and visits with your health care provider that can promote health and wellness. What does preventive care include? A yearly physical exam. This is also called an annual well check. Dental exams once or twice a year. Routine eye exams. Ask your health care provider how often you should have your eyes checked. Personal lifestyle choices, including: Daily care of your teeth and gums. Regular physical activity. Eating a healthy diet. Avoiding tobacco and drug use. Limiting alcohol use. Practicing safe sex. Taking low-dose aspirin  every day. Taking vitamin and mineral supplements as recommended by your health care provider. What happens during an annual well check? The services and screenings done by your health care provider during your annual well check will depend on your age, overall health, lifestyle risk factors, and family history of disease. Counseling  Your health care provider may ask you questions about your: Alcohol use. Tobacco use. Drug use. Emotional well-being. Home and relationship well-being. Sexual activity. Eating habits. History of falls. Memory and ability to understand (cognition). Work and work Statistician. Reproductive health. Screening  You may have the following tests or measurements: Height, weight, and BMI. Blood pressure. Lipid and cholesterol levels. These may be checked every 5 years, or more frequently if you are over 71 years old. Skin check. Lung cancer screening. You may have this screening every year starting at age 71 if you have a 30-pack-year history of smoking and currently smoke or have quit within the past 15 years. Fecal occult blood test (FOBT) of the stool. You may have this test every year starting at age 71. Flexible sigmoidoscopy or colonoscopy. You may have a sigmoidoscopy every 5 years or a colonoscopy every 10 years starting at age 71. Hepatitis C blood test. Hepatitis B blood test. Sexually transmitted disease (STD) testing. Diabetes screening. This is done by checking your  blood sugar (glucose) after you have not eaten for a while (fasting). You may have this done every 1-3 years. Bone density scan. This is done to screen for osteoporosis. You may have this done starting at age 71. Mammogram. This may be done every 1-2 years. Talk to your health care provider about how often you should have regular mammograms. Talk with your health care provider about your test results, treatment options, and if necessary, the need for more tests. Vaccines  Your health  care provider may recommend certain vaccines, such as: Influenza vaccine. This is recommended every year. Tetanus, diphtheria, and acellular pertussis (Tdap, Td) vaccine. You may need a Td booster every 10 years. Zoster vaccine. You may need this after age 71. Pneumococcal 13-valent conjugate (PCV13) vaccine. One dose is recommended after age 18. Pneumococcal polysaccharide (PPSV23) vaccine. One dose is recommended after age 71. Talk to your health care provider about which screenings and vaccines you need and how often you need them. This information is not intended to replace advice given to you by your health care provider. Make sure you discuss any questions you have with your health care provider. Document Released: 01/07/2016 Document Revised: 08/30/2016 Document Reviewed: 10/12/2015 Elsevier Interactive Patient Education  2017 [place] Prevention in the Home Falls can cause injuries. They can happen to people of all ages. There are many things you can do to make your home safe and to help prevent falls. What can I do on the outside of my home? Regularly fix the edges of walkways and driveways and fix any cracks. Remove anything that might make you trip as you walk through a door, such as a raised step or threshold. Trim any bushes or trees on the path to your home. Use bright outdoor lighting. Clear any walking paths of anything that might make someone trip, such as rocks or tools. Regularly check to see if handrails are loose or broken. Make sure that both sides of any steps have handrails. Any raised decks and porches should have guardrails on the edges. Have any leaves, snow, or ice cleared regularly. Use sand or salt on walking paths during winter. Clean up any spills in your garage right away. This includes oil or grease spills. What can I do in the bathroom? Use night lights. Install grab bars by the toilet and in the tub and shower. Do not use towel bars as grab  bars. Use non-skid mats or decals in the tub or shower. If you need to sit down in the shower, use a plastic, non-slip stool. Keep the floor dry. Clean up any water that spills on the floor as soon as it happens. Remove soap buildup in the tub or shower regularly. Attach bath mats securely with double-sided non-slip rug tape. Do not have throw rugs and other things on the floor that can make you trip. What can I do in the bedroom? Use night lights. Make sure that you have a light by your bed that is easy to reach. Do not use any sheets or blankets that are too big for your bed. They should not hang down onto the floor. Have a firm chair that has side arms. You can use this for support while you get dressed. Do not have throw rugs and other things on the floor that can make you trip. What can I do in the kitchen? Clean up any spills right away. Avoid walking on wet floors. Keep items that you use a lot in  easy-to-reach places. If you need to reach something above you, use a strong step stool that has a grab bar. Keep electrical cords out of the way. Do not use floor polish or wax that makes floors slippery. If you must use wax, use non-skid floor wax. Do not have throw rugs and other things on the floor that can make you trip. What can I do with my stairs? Do not leave any items on the stairs. Make sure that there are handrails on both sides of the stairs and use them. Fix handrails that are broken or loose. Make sure that handrails are as long as the stairways. Check any carpeting to make sure that it is firmly attached to the stairs. Fix any carpet that is loose or worn. Avoid having throw rugs at the top or bottom of the stairs. If you do have throw rugs, attach them to the floor with carpet tape. Make sure that you have a light switch at the top of the stairs and the bottom of the stairs. If you do not have them, ask someone to add them for you. What else can I do to help prevent  falls? Wear shoes that: Do not have high heels. Have rubber bottoms. Are comfortable and fit you well. Are closed at the toe. Do not wear sandals. If you use a stepladder: Make sure that it is fully opened. Do not climb a closed stepladder. Make sure that both sides of the stepladder are locked into place. Ask someone to hold it for you, if possible. Clearly mark and make sure that you can see: Any grab bars or handrails. First and last steps. Where the edge of each step is. Use tools that help you move around (mobility aids) if they are needed. These include: Canes. Walkers. Scooters. Crutches. Turn on the lights when you go into a dark area. Replace any light bulbs as soon as they burn out. Set up your furniture so you have a clear path. Avoid moving your furniture around. If any of your floors are uneven, fix them. If there are any pets around you, be aware of where they are. Review your medicines with your doctor. Some medicines can make you feel dizzy. This can increase your chance of falling. Ask your doctor what other things that you can do to help prevent falls. This information is not intended to replace advice given to you by your health care provider. Make sure you discuss any questions you have with your health care provider. Document Released: 10/07/2009 Document Revised: 05/18/2016 Document Reviewed: 01/15/2015 Elsevier Interactive Patient Education  2017 [employer].

## 2021-11-02 NOTE — Assessment & Plan Note (Signed)
Presumed by ultrasound changes and serologies negative for autoimmune causes of hepatitis.  Current liver enzymes are due and all modifiable risk factors including obesity, diabetes and hyperlipidemia have been addressed   Lab Results  Component Value Date   ALT 8 12/06/2020   AST 12 12/06/2020   ALKPHOS 131 (H) 12/20/2020   BILITOT 0.5 12/06/2020

## 2021-11-03 LAB — LIPID PANEL
Cholesterol: 195 mg/dL (ref 0–200)
HDL: 71.1 mg/dL (ref >39.00)
LDL Cholesterol: 104 mg/dL — ABNORMAL HIGH (ref 0–99)
NonHDL: 123.54
Total CHOL/HDL Ratio: 3
Triglycerides: 98 mg/dL (ref 0.0–149.0)
VLDL: 19.6 mg/dL (ref 0.0–40.0)

## 2021-11-03 LAB — COMPREHENSIVE METABOLIC PANEL
ALT: 8 U/L (ref 0–35)
AST: 14 U/L (ref 0–37)
Albumin: 4.5 g/dL (ref 3.5–5.2)
Alkaline Phosphatase: 105 U/L (ref 39–117)
BUN: 23 mg/dL (ref 6–23)
CO2: 30 mEq/L (ref 19–32)
Calcium: 10.3 mg/dL (ref 8.4–10.5)
Chloride: 101 mEq/L (ref 96–112)
Creatinine, Ser: 1.12 mg/dL (ref 0.40–1.20)
GFR: 49.51 mL/min — ABNORMAL LOW (ref >60.00)
Glucose, Bld: 95 mg/dL (ref 70–99)
Potassium: 4.2 mEq/L (ref 3.5–5.1)
Sodium: 140 mEq/L (ref 135–145)
Total Bilirubin: 0.4 mg/dL (ref 0.2–1.2)
Total Protein: 7.3 g/dL (ref 6.0–8.3)

## 2021-11-03 LAB — POCT GLYCOSYLATED HEMOGLOBIN (HGB A1C): Hemoglobin A1C: 6.2 % — AB (ref 4.0–5.6)

## 2021-11-03 LAB — MICROALBUMIN / CREATININE URINE RATIO
Creatinine,U: 148.3 mg/dL
Microalb Creat Ratio: 0.5 mg/g (ref 0.0–30.0)
Microalb, Ur: <0.7 mg/dL (ref 0.0–1.9)

## 2021-11-03 LAB — TSH: TSH: 1.2 u[IU]/mL (ref 0.35–5.50)

## 2021-11-06 MED ORDER — ATORVASTATIN CALCIUM 20 MG PO TABS: 20.0000 mg | ORAL_TABLET | Freq: Every day | ORAL | 3 refills | Status: DC

## 2021-11-06 NOTE — Addendum Note (Signed)
Addended by: [name] on: 11/06/2021 01:44 PM   Modules accepted: Orders

## 2021-11-06 NOTE — Assessment & Plan Note (Signed)
LDL has risen on 40 mg simvastatin.  Changing to atorvastatin.   . Lab Results  Component Value Date   CHOL 195 11/02/2021   HDL 71.10 11/02/2021   LDLCALC 104 (H) 11/02/2021   LDLDIRECT 71.0 09/14/2017   TRIG 98.0 11/02/2021   CHOLHDL 3 11/02/2021

## 2021-11-06 NOTE — Assessment & Plan Note (Signed)
GFR has been < 60 ml/min for the past year. Patient advised;  Nephrology referral offered.  Advised to restrict use of NSAIDs to prn  Not more than 2 times per week and to use tylenol

## 2021-11-26 ENCOUNTER — Other Ambulatory Visit: Payer: Self-pay | Admitting: Hematology and Oncology

## 2021-11-26 DIAGNOSIS — C50411 Malignant neoplasm of upper-outer quadrant of right female breast: Secondary | ICD-10-CM

## 2021-11-26 DIAGNOSIS — Z170 Estrogen receptor positive status [ER+]: Secondary | ICD-10-CM

## 2021-12-08 ENCOUNTER — Telehealth: Payer: Self-pay | Admitting: Internal Medicine

## 2021-12-08 NOTE — Telephone Encounter (Signed)
Pt was given new medication Lipitor. Pt called in stating she is having very bad leg pain and that it's been bothering her for two weeks. Pt declined appt and nurse line, pt would like for Janett Billow to give her a call back.

## 2021-12-08 NOTE — Telephone Encounter (Signed)
Spoke with pt and she stated that since she was changed from Simvastatin to Atorvastatin she has started having terrible muscle cramps that are waking her up during the night. Pt stated that she was going to stop the Atorvastatin for a couple of days and see if the muscle cramps went away. Pt wanted to know if she needed to go back on the Simvastatin or try a different cholesterol medication.

## 2021-12-09 NOTE — Telephone Encounter (Signed)
Pt is aware.  

## 2021-12-16 ENCOUNTER — Telehealth: Payer: Self-pay | Admitting: Internal Medicine

## 2021-12-16 NOTE — Telephone Encounter (Signed)
Patient  Called in to advise changing insurance , she will no longer have [place], In January will have new insurance which will be [employer]. Patient advised  medication currently takenTelmisa-hctz 80/25 mg [employer]  Cost $121.00  patient says too much, customer requesting different medication , Please call patient @ [phone]

## 2021-12-16 NOTE — Telephone Encounter (Signed)
Does she have a formulary for her new insurance?    I cannot tell ahead of time whether a medication is covered  so I will have to blindly pick one similar eg  , losartan hct    How much does she have left.

## 2021-12-20 ENCOUNTER — Other Ambulatory Visit: Payer: Self-pay

## 2021-12-20 MED ORDER — METFORMIN HCL ER 750 MG PO TB24: 750.0000 mg | ORAL_TABLET | Freq: Every day | ORAL | 3 refills | Status: DC

## 2021-12-20 MED ORDER — TRUE METRIX AIR GLUCOSE METER W/DEVICE KIT: PACK | 0 refills | Status: AC

## 2021-12-20 MED ORDER — TRUEPLUS LANCETS 33G MISC: 3 refills | Status: DC

## 2021-12-20 NOTE — Telephone Encounter (Signed)
Spoke with pt and she stated that she was going to try to get the formulary and send it to [place].

## 2021-12-27 ENCOUNTER — Other Ambulatory Visit: Payer: Self-pay

## 2021-12-27 DIAGNOSIS — C50411 Malignant neoplasm of upper-outer quadrant of right female breast: Secondary | ICD-10-CM

## 2021-12-27 MED ORDER — ANASTROZOLE 1 MG PO TABS: 1.0000 mg | ORAL_TABLET | Freq: Every day | ORAL | 0 refills | Status: DC

## 2022-02-11 NOTE — Progress Notes (Incomplete)
Patient Care Team: [name], MD as PCP - General (Internal Medicine) [name], MD (General Surgery) [name], MD as Consulting Physician (Hematology and Oncology) Delice Bison, Charlestine Massed, NP as Nurse Practitioner (Hematology and Oncology) Kyung Rudd, MD as Consulting Physician (Radiation Oncology) [name], MD as Consulting Physician (General Surgery)  DIAGNOSIS: No diagnosis found.  SUMMARY OF ONCOLOGIC HISTORY: Oncology History  Malignant neoplasm of upper-outer quadrant of right breast in female, estrogen receptor positive ([place])  04/09/2017 Mammogram   Right breast mass 9:00 position 1 cm from nipple: 4 mm; small asymmetry in the upper outer quadrant measuring 1 cm   04/20/2017 Initial Diagnosis   Right breast stereotactic biopsy: IDC with DCIS, biopsy 9:00 position: [facility], ER 100%, PR 100% HER-2 pending   05/16/2017 Surgery   Right lumpectomy: IDC grade 1, microscopic focus, low-grade DCIS, 0/3 lymph nodes negative, Atypical lobular hyperplasia, ER 100%, PR 100%, HER-2 negative ratio 1.63, Ki-67 insufficient, T76mc, N0 stage IA   06/18/2017 - 07/17/2017 Radiation Therapy   Adjuvant radiation therapy   09/2017 -  Anti-estrogen oral therapy   Anastrozole 156mdaily     CHIEF COMPLIANT: Follow-up of right breast cancer on anastrozole therapy  INTERVAL HISTORY: DoETOLA [name] a [age].o. with above-mentioned history of right breast cancer treated with lumpectomy, adjuvant radiation, and who is currently on anastrozole therapy. She presents to the clinic today for follow-up.  .   ALLERGIES:  has No Known Allergies.  MEDICATIONS:  Current Outpatient Medications  Medication Sig Dispense Refill   anastrozole (ARIMIDEX) 1 MG tablet Take 1 tablet (1 mg total) by mouth daily. 90 tablet 0   Ascorbic Acid (VITAMIN C) 1000 MG tablet Take 1 tablet (1,000 mg total) by mouth daily. (Patient not taking: No sig reported)     aspirin 81 MG tablet Take 81  mg by mouth daily.     atorvastatin (LIPITOR) 20 MG tablet Take 1 tablet (20 mg total) by mouth daily. 90 tablet 3   Blood Glucose Monitoring Suppl (TRUE METRIX AIR GLUCOSE METER) w/Device KIT Use to check blood sugars up to four times daily. 1 kit 0   cholecalciferol (VITAMIN D) 25 MCG (1000 UNIT) tablet Take 1,000 Units by mouth daily.     cyclobenzaprine (FLEXERIL) 10 MG tablet TAKE 1 TABLET BY MOUTH 3  TIMES DAILY AS NEEDED FOR  MUSCLE SPASM(S) 90 tablet 1   folic acid (FOLVITE) 1 MG tablet Take 1 tablet (1 mg total) by mouth daily. (Patient not taking: No sig reported)     glucose blood test strip Use daily to test blood sugar E11.9 one touch verio 100 each 12   metFORMIN (GLUCOPHAGE-XR) 750 MG 24 hr tablet Take 1 tablet (750 mg total) by mouth daily with breakfast. 90 tablet 3   non-metallic deodorant (ALRA) MISC Apply 1 application topically.     nystatin (MYCOSTATIN/NYSTOP) powder Apply 1 application topically 2 (two) times daily. To rash until resolved. (Patient not taking: No sig reported) 45 g 3   telmisartan-hydrochlorothiazide (MICARDIS HCT) 80-25 MG tablet TAKE 1 TABLET BY MOUTH IN  THE EVENING 90 tablet 0   TRUEplus Lancets 33G MISC Use to check blood sugars up to four times daily. 400 each 3   No current facility-administered medications for this visit.    PHYSICAL EXAMINATION: ECOG PERFORMANCE STATUS: {CHL ONC ECOG PS:[id number]}  There were no vitals filed for this visit. There were no vitals filed for this visit.  BREAST:*** No palpable  masses or nodules in either right or left breasts. No palpable axillary supraclavicular or infraclavicular adenopathy no breast tenderness or nipple discharge. (exam performed in the presence of a chaperone)  LABORATORY DATA:  I have reviewed the data as listed CMP Latest Ref Rng & Units 11/02/2021 12/20/2020 12/06/2020  Glucose 70 - 99 mg/dL 95 120(H) 142(H)  BUN 6 - 23 mg/dL 23 24(H) 22  Creatinine 0.40 - 1.20 mg/dL 1.12 1.11 1.21(H)   Sodium 135 - 145 mEq/L 140 138 137  Potassium 3.5 - 5.1 mEq/L 4.2 4.5 4.3  Chloride 96 - 112 mEq/L 101 102 101  CO2 19 - 32 mEq/L _0 Calcium 8.4 - 10.5 mg/dL 10.3 10.0 9.9  Total Protein 6.0 - 8.3 g/dL 7.3 - 6.9  Total Bilirubin 0.2 - 1.2 mg/dL 0.4 - 0.5  Alkaline Phos 39 - 117 U/L 105 131(H) 131(H)  AST 0 - 37 U/L 14 - 12  ALT 0 - 35 U/L 8 - 8    Lab Results  Component Value Date   WBC 8.8 07/20/2016   HGB 12.1 07/20/2016   HCT 36.9 07/20/2016   MCV 91.7 07/20/2016   PLT 323.0 07/20/2016   NEUTROABS 6.0 07/20/2016    ASSESSMENT & PLAN:  No problem-specific Assessment & Plan notes found for this encounter.    No orders of the defined types were placed in this encounter.  The patient has a good understanding of the overall plan. she agrees with it. she will call with any problems that may develop before the next visit here.  Total time spent: *** mins including face to face time and time spent for planning, charting and coordination of care  Rulon Eisenmenger, MD, MPH 02/11/2022  I, Thana Ates, am acting as scribe for Dr. [name].  {insert scribe attestation}

## 2022-02-13 ENCOUNTER — Inpatient Hospital Stay: Payer: Medicare HMO | Attending: Hematology and Oncology | Admitting: Hematology and Oncology

## 2022-02-13 NOTE — Assessment & Plan Note (Signed)
Right breast mass 9:00 position 1 cm from nipple: 4 mm; small asymmetry in the upper outer quadrant measuring 1 cm 04/19/2017:Right breast stereotactic biopsy: IDC with DCIS, biopsy 9:00 position: [facility], ER 100%, PR 100% HER-2Neg 05/21/17:Right lumpectomy: IDC grade 1, microscopic focus, low-grade DCIS, 0/3 lymph nodes negative, Atypical lobular hyperplasia, ER 100%, PR 100%, HER-2 negative ratio 1.63, Ki-67 insufficient, T50mc, N0 stage IA  Adj XRT 06/18/17- 07/17/17  Current Treatment: Anastrozole 1 mg daily Anastrozole toxicities: 1.Hot flashes: Much improved 2.Myalgias: No longer an issue  Breast cancer surveillance: 1.Mammogram10/05/2020: Benign breast density category B 2.Breast exam 02/13/2022: Benign, scar tissue is the same as previous years.  Breast lymphedema:Much better  RTC in 1 year

## 2022-02-17 ENCOUNTER — Other Ambulatory Visit: Payer: Self-pay | Admitting: General Surgery

## 2022-02-17 DIAGNOSIS — Z853 Personal history of malignant neoplasm of breast: Secondary | ICD-10-CM

## 2022-03-08 ENCOUNTER — Other Ambulatory Visit: Payer: Self-pay | Admitting: Hematology and Oncology

## 2022-03-08 DIAGNOSIS — C50411 Malignant neoplasm of upper-outer quadrant of right female breast: Secondary | ICD-10-CM

## 2022-03-17 ENCOUNTER — Other Ambulatory Visit: Payer: Self-pay

## 2022-03-17 ENCOUNTER — Ambulatory Visit (INDEPENDENT_AMBULATORY_CARE_PROVIDER_SITE_OTHER): Payer: Medicare HMO | Admitting: Internal Medicine

## 2022-03-17 ENCOUNTER — Encounter: Payer: Self-pay | Admitting: Internal Medicine

## 2022-03-17 VITALS — BP 130/68 | HR 72 | Temp 98.0°F | Ht 62.0 in | Wt 189.0 lb

## 2022-03-17 DIAGNOSIS — T466X5A Adverse effect of antihyperlipidemic and antiarteriosclerotic drugs, initial encounter: Secondary | ICD-10-CM

## 2022-03-17 DIAGNOSIS — M47816 Spondylosis without myelopathy or radiculopathy, lumbar region: Secondary | ICD-10-CM | POA: Diagnosis not present

## 2022-03-17 DIAGNOSIS — M545 Low back pain, unspecified: Secondary | ICD-10-CM

## 2022-03-17 DIAGNOSIS — M791 Myalgia, unspecified site: Secondary | ICD-10-CM | POA: Diagnosis not present

## 2022-03-17 DIAGNOSIS — I739 Peripheral vascular disease, unspecified: Secondary | ICD-10-CM

## 2022-03-17 DIAGNOSIS — Z170 Estrogen receptor positive status [ER+]: Secondary | ICD-10-CM

## 2022-03-17 DIAGNOSIS — E1121 Type 2 diabetes mellitus with diabetic nephropathy: Secondary | ICD-10-CM

## 2022-03-17 DIAGNOSIS — I10 Essential (primary) hypertension: Secondary | ICD-10-CM | POA: Diagnosis not present

## 2022-03-17 DIAGNOSIS — M1610 Unilateral primary osteoarthritis, unspecified hip: Secondary | ICD-10-CM

## 2022-03-17 DIAGNOSIS — E7800 Pure hypercholesterolemia, unspecified: Secondary | ICD-10-CM

## 2022-03-17 DIAGNOSIS — G8929 Other chronic pain: Secondary | ICD-10-CM

## 2022-03-17 DIAGNOSIS — E1122 Type 2 diabetes mellitus with diabetic chronic kidney disease: Secondary | ICD-10-CM

## 2022-03-17 DIAGNOSIS — C50411 Malignant neoplasm of upper-outer quadrant of right female breast: Secondary | ICD-10-CM

## 2022-03-17 DIAGNOSIS — R5383 Other fatigue: Secondary | ICD-10-CM | POA: Diagnosis not present

## 2022-03-17 DIAGNOSIS — E66812 Obesity, class 2: Secondary | ICD-10-CM

## 2022-03-17 DIAGNOSIS — K760 Fatty (change of) liver, not elsewhere classified: Secondary | ICD-10-CM

## 2022-03-17 DIAGNOSIS — N182 Chronic kidney disease, stage 2 (mild): Secondary | ICD-10-CM

## 2022-03-17 DIAGNOSIS — D649 Anemia, unspecified: Secondary | ICD-10-CM

## 2022-03-17 DIAGNOSIS — Z79811 Long term (current) use of aromatase inhibitors: Secondary | ICD-10-CM

## 2022-03-17 DIAGNOSIS — Z6836 Body mass index (BMI) 36.0-36.9, adult: Secondary | ICD-10-CM

## 2022-03-17 MED ORDER — TRIAMCINOLONE ACETONIDE 0.1 % EX CREA: TOPICAL_CREAM | Freq: Two times a day (BID) | CUTANEOUS | 1 refills | Status: DC

## 2022-03-17 MED ORDER — OZEMPIC (0.25 OR 0.5 MG/DOSE) 2 MG/1.5ML ~~LOC~~ SOPN: 0.2500 mg | PEN_INJECTOR | SUBCUTANEOUS | 2 refills | Status: DC

## 2022-03-17 NOTE — Patient Instructions (Signed)
I have refilled the triamcinolone for your rash ? ?I am recommending an injectable medication to help curb your appetite and control your  blood sugars.    It is called Ozempic.  It slows down your stomach emptying so you stay full longer . once you pick up the medication,  Schedule  an RN visit so we can show you  To  inject it   ?  ? ?ozempic is a medication that is taken as a weekly subcutaneous injection. It is not insulin.  It  causes your pancreas to increase its  own insulin secretion  And also slows down the emptying of your stomach,  So it decreases your appetite and helps you lose weight.  ? ?The dose for the first 4 weekly doses is 0.25 mg.  You may have mild nausea on the first or second day but this should resolve.  If not  ,  stop the medication.  ? ?As long as you are losing weight,  you can continue the dose you are on .  Only increase the dose to 0.5 mg after 4 weeks if your weight has plateaued.  Let me know when you need a refill and what dose you are taking.   ?

## 2022-03-17 NOTE — Progress Notes (Signed)
? ?Subjective:  ?Patient ID: Martha [name], female    DOB: 08-28-1951  Age: 71 y.o. MRN: [id number] ? ?CC: The primary encounter diagnosis was Controlled type 2 diabetes mellitus with diabetic nephropathy, without long-term current use of insulin ([place]). Diagnoses of Pure hypercholesterolemia, Essential hypertension, benign, Caregiver with fatigue, Myalgia due to statin, Peripheral vascular disease of lower extremity (HCC), Class 2 severe obesity due to excess calories with serious comorbidity and body mass index (BMI) of 36.0 to 36.9 in adult [facility]), Malignant neoplasm of upper-outer quadrant of right breast in female, estrogen receptor positive ([place]), Arthritis of lumbar spine, Chronic low back pain, unspecified back pain laterality, unspecified whether sciatica present, Use of anastrozole (Arimidex), Hip arthritis, Fatty liver, CKD stage 2 due to type 2 diabetes mellitus ([place]), and Anemia, unspecified type were also pertinent to this visit. ? ? ?This visit occurred during the SARS-CoV-2 public health emergency.  Safety protocols were in place, including screening questions prior to the visit, additional usage of staff PPE, and extensive cleaning of exam room while observing appropriate contact time as indicated for disinfecting solutions.   ? ?HPI ?[name] presents for follow up on multiple issues ?Chief Complaint  ?Patient presents with  ? Follow-up  ?  6 mo f/u - pt reports having a lot of pain in her knees and R hip. Also reports may have developed eczema ? Unsure. Areas of itchy, discolored skin on legs and back. Using Triamcinolone cream, helped.  ? ?1) T2DM: she has been tolerating metformin and telmisartan  but has stopped taking atorvastatin due to myalgias.  Checking random and fasting blood sugars every other week  and notes no readings > 150.   Patient has been unable to lose or maintain a healthy weight despite good effort dur to orthopedic issues. . Encouraged to increase exercise involvement  to include more intense aerobic activity for 30 minutes 5 days per week.  Screened for contraindications to use of  GLP 1 agonists for appetite suppression and she has none.  The risks and benefits of pharmacotherapy discussed . ? ?2) nonspecific skin rash :  currently resolved but periodically develops round puritic patches on torse and extremities that resolve completely with triamcinolone  ? ?3) right hip pain :  she has a history of lumbar radiculopathy and right hip pain that is aggravated by  working all day,  managed  with tylenol arthritis   has seen orthopedics and had PT completed in December 2022  also having  left knee pain medial side,  responds to topical to voltaren.  No history of falls or fractures.  Knee does not swell . Does not want to see orthopedics at this time  ? ?4) Insomnia:  caused by frequent hot flashes secondary to use of arimidex.  She is in Year 5 and optimistic about stopping therapy at her next oncology follow up  ? ?5) Hypertension: patient checks blood pressure twice weekly at home.  Readings have been for the most part < 140/80 at rest . Patient is following a reduce salt diet most days and is taking medications as prescribed (telmisartan/hct) ? ?Outpatient Medications Prior to Visit  ?Medication Sig Dispense Refill  ? anastrozole (ARIMIDEX) 1 MG tablet TAKE 1 TABLET EVERY DAY 90 tablet 0  ? Ascorbic Acid (VITAMIN C) 1000 MG tablet Take 1 tablet (1,000 mg total) by mouth daily.    ? aspirin 81 MG tablet Take 81 mg by mouth daily.    ? Blood Glucose  Monitoring Suppl (TRUE METRIX AIR GLUCOSE METER) w/Device KIT Use to check blood sugars up to four times daily. 1 kit 0  ? cholecalciferol (VITAMIN D) 25 MCG (1000 UNIT) tablet Take 1,000 Units by mouth daily.    ? glucose blood test strip Use daily to test blood sugar E11.9 one touch verio 100 each 12  ? metFORMIN (GLUCOPHAGE-XR) 750 MG 24 hr tablet Take 1 tablet (750 mg total) by mouth daily with breakfast. 90 tablet 3  ?  non-metallic deodorant (ALRA) MISC Apply 1 application topically.    ? nystatin (MYCOSTATIN/NYSTOP) powder Apply 1 application topically 2 (two) times daily. To rash until resolved. 45 g 3  ? telmisartan-hydrochlorothiazide (MICARDIS HCT) 80-25 MG tablet TAKE 1 TABLET BY MOUTH IN  THE EVENING 90 tablet 0  ? TRUEplus Lancets 33G MISC Use to check blood sugars up to four times daily. 400 each 3  ? atorvastatin (LIPITOR) 20 MG tablet Take 1 tablet (20 mg total) by mouth daily. (Patient not taking: Reported on 03/17/2022) 90 tablet 3  ? cyclobenzaprine (FLEXERIL) 10 MG tablet TAKE 1 TABLET BY MOUTH 3  TIMES DAILY AS NEEDED FOR  MUSCLE SPASM(S) (Patient not taking: Reported on 03/17/2022) 90 tablet 1  ? folic acid (FOLVITE) 1 MG tablet Take 1 tablet (1 mg total) by mouth daily. (Patient not taking: Reported on 07/28/2021)    ? ?No facility-administered medications prior to visit.  ? ? ?Review of Systems; ? ?Patient denies headache, fevers, malaise, unintentional weight loss, skin rash, eye pain, sinus congestion and sinus pain, sore throat, dysphagia,  hemoptysis , cough, dyspnea, wheezing, chest pain, palpitations, orthopnea, edema, abdominal pain, nausea, melena, diarrhea, constipation, flank pain, dysuria, hematuria, urinary  Frequency, nocturia, numbness, tingling, seizures,  Focal weakness, Loss of consciousness,  Tremor, insomnia, depression, anxiety, and suicidal ideation.   ? ? ? ?Objective:  ?BP 130/68 (BP Location: Left Arm, Patient Position: Sitting, Cuff Size: Large)   Pulse 72   Temp 98 ?F (36.7 ?C) (Oral)   Ht '5\' 2"'  (1.575 m)   Wt 189 lb (85.7 kg)   SpO2 98%   BMI 34.57 kg/m?  ? ?BP Readings from Last 3 Encounters:  ?03/17/22 130/68  ?11/02/21 130/72  ?07/28/21 106/60  ? ? ?Wt Readings from Last 3 Encounters:  ?03/17/22 189 lb (85.7 kg)  ?11/02/21 187 lb 12.8 oz (85.2 kg)  ?11/02/21 190 lb (86.2 kg)  ? ? ?General appearance: alert, cooperative and appears stated age ?Ears: normal TM's and external ear  canals both ears ?Throat: lips, mucosa, and tongue normal; teeth and gums normal ?Neck: no adenopathy, no carotid bruit, supple, symmetrical, trachea midline and thyroid not enlarged, symmetric, no tenderness/mass/nodules ?Back: symmetric, no curvature. ROM normal. No CVA tenderness. ?Lungs: clear to auscultation bilaterally ?Heart: regular rate and rhythm, S1, S2 normal, no murmur, click, rub or gallop ?Abdomen: soft, non-tender; bowel sounds normal; no masses,  no organomegaly ?Pulses: 2+ and symmetric ?Skin: Skin color, texture, turgor normal. No rashes or lesions ?Lymph nodes: Cervical, supraclavicular, and axillary nodes normal. ? ?Lab Results  ?Component Value Date  ? HGBA1C 6.2 (H) 03/17/2022  ? HGBA1C 6.2 (A) 11/03/2021  ? HGBA1C 6.7 (H) 12/06/2020  ? ? ?Lab Results  ?Component Value Date  ? CREATININE 1.06 (H) 03/17/2022  ? CREATININE 1.12 11/02/2021  ? CREATININE 1.11 12/20/2020  ? ? ?Lab Results  ?Component Value Date  ? WBC 7.4 03/17/2022  ? HGB 11.4 (L) 03/17/2022  ? HCT 34.4 (L) 03/17/2022  ? PLT  365 03/17/2022  ? GLUCOSE 86 03/17/2022  ? CHOL 210 (H) 03/17/2022  ? TRIG 116 03/17/2022  ? HDL 66 03/17/2022  ? LDLDIRECT 71.0 09/14/2017  ? LDLCALC 122 (H) 03/17/2022  ? ALT 8 03/17/2022  ? AST 13 03/17/2022  ? NA 140 03/17/2022  ? K 4.7 03/17/2022  ? CL 103 03/17/2022  ? CREATININE 1.06 (H) 03/17/2022  ? BUN 22 03/17/2022  ? CO2 27 03/17/2022  ? TSH 1.20 11/02/2021  ? HGBA1C 6.2 (H) 03/17/2022  ? MICROALBUR <0.7 11/02/2021  ? ? ?DG Lumbar Spine Complete ? ?Result Date: 07/29/2021 ?CLINICAL DATA:  Low back pain for several days EXAM: LUMBAR SPINE - COMPLETE 4+ VIEW COMPARISON:  None. FINDINGS: Five lumbar type vertebral bodies are well visualized. Vertebral body height is well maintained. No anterolisthesis is noted. Mild osteophytic changes are seen as well as facet hypertrophic changes. No pars defects are noted. Scattered large and small bowel gas is seen. No soft tissue abnormality is noted. IMPRESSION:  Mild degenerative change without acute abnormality. Electronically Signed   By: Inez Catalina M.D.   On: 07/29/2021 20:13  ? ?DG Hip Unilat W OR W/O Pelvis 2-3 Views Right ? ?Result Date: 07/29/2021 ?CLINICAL DATA:  Low back

## 2022-03-17 NOTE — Assessment & Plan Note (Signed)
Willing to retry atorvastatin 2 times per week  ?

## 2022-03-17 NOTE — Assessment & Plan Note (Signed)
No longer taking lipitor  Due to myalgias.  Willing to try it 2/week  ?

## 2022-03-18 ENCOUNTER — Encounter: Payer: Self-pay | Admitting: Internal Medicine

## 2022-03-18 DIAGNOSIS — D649 Anemia, unspecified: Secondary | ICD-10-CM | POA: Insufficient documentation

## 2022-03-18 DIAGNOSIS — D519 Vitamin B12 deficiency anemia, unspecified: Secondary | ICD-10-CM | POA: Insufficient documentation

## 2022-03-18 DIAGNOSIS — I739 Peripheral vascular disease, unspecified: Secondary | ICD-10-CM | POA: Insufficient documentation

## 2022-03-18 LAB — COMPREHENSIVE METABOLIC PANEL
AG Ratio: 1.5 (calc) (ref 1.0–2.5)
ALT: 8 U/L (ref 6–29)
AST: 13 U/L (ref 10–35)
Albumin: 4.1 g/dL (ref 3.6–5.1)
Alkaline phosphatase (APISO): 107 U/L (ref 37–153)
BUN/Creatinine Ratio: 21 (calc) (ref 6–22)
BUN: 22 mg/dL (ref 7–25)
CO2: 27 mmol/L (ref 20–32)
Calcium: 10.4 mg/dL (ref 8.6–10.4)
Chloride: 103 mmol/L (ref 98–110)
Creat: 1.06 mg/dL — ABNORMAL HIGH (ref 0.60–1.00)
Globulin: 2.8 g/dL (calc) (ref 1.9–3.7)
Glucose, Bld: 86 mg/dL (ref 65–99)
Potassium: 4.7 mmol/L (ref 3.5–5.3)
Sodium: 140 mmol/L (ref 135–146)
Total Bilirubin: 0.4 mg/dL (ref 0.2–1.2)
Total Protein: 6.9 g/dL (ref 6.1–8.1)

## 2022-03-18 LAB — CBC WITH DIFFERENTIAL/PLATELET
Absolute Monocytes: 525 cells/uL (ref 200–950)
Basophils Absolute: 30 cells/uL (ref 0–200)
Basophils Relative: 0.4 %
Eosinophils Absolute: 81 cells/uL (ref 15–500)
Eosinophils Relative: 1.1 %
HCT: 34.4 % — ABNORMAL LOW (ref 35.0–45.0)
Hemoglobin: 11.4 g/dL — ABNORMAL LOW (ref 11.7–15.5)
Lymphs Abs: 2634 cells/uL (ref 850–3900)
MCH: 30.8 pg (ref 27.0–33.0)
MCHC: 33.1 g/dL (ref 32.0–36.0)
MCV: 93 fL (ref 80.0–100.0)
MPV: 9.4 fL (ref 7.5–12.5)
Monocytes Relative: 7.1 %
Neutro Abs: 4129 cells/uL (ref 1500–7800)
Neutrophils Relative %: 55.8 %
Platelets: 365 10*3/uL (ref 140–400)
RBC: 3.7 10*6/uL — ABNORMAL LOW (ref 3.80–5.10)
RDW: 13.1 % (ref 11.0–15.0)
Total Lymphocyte: 35.6 %
WBC: 7.4 10*3/uL (ref 3.8–10.8)

## 2022-03-18 LAB — HEMOGLOBIN A1C
Hgb A1c MFr Bld: 6.2 % of total Hgb — ABNORMAL HIGH (ref <5.7)
Mean Plasma Glucose: 131 mg/dL
eAG (mmol/L): 7.3 mmol/L

## 2022-03-18 LAB — LIPID PANEL
Cholesterol: 210 mg/dL — ABNORMAL HIGH (ref <200)
HDL: 66 mg/dL (ref >=50)
LDL Cholesterol (Calc): 122 mg/dL (calc) — ABNORMAL HIGH
Non-HDL Cholesterol (Calc): 144 mg/dL (calc) — ABNORMAL HIGH (ref <130)
Total CHOL/HDL Ratio: 3.2 (calc) (ref <5.0)
Triglycerides: 116 mg/dL (ref <150)

## 2022-03-18 NOTE — Assessment & Plan Note (Signed)
Glycemic control remains excellent  with Metformin XR but she remains morbidly obese  . Patient has been unable to lose or maintain a healthy weight despite good effort. She is hindered from increasing  exercise involvement  Because of othopedic issues aggravated by her weight.  .  She was screened for contraindications to use of  GLP 1 agonists for appetite suppression and she has none.  The risks and benefits of pharmacotherapy discussed and she is requesting a trial of therapy .  rx written.     ? ?Lab Results  ?Component Value Date  ? HGBA1C 6.2 (H) 03/17/2022  ? ?Lab Results  ?Component Value Date  ? MICROALBUR <0.7 11/02/2021  ? ? ?

## 2022-03-18 NOTE — Assessment & Plan Note (Signed)
Her mother is now in an adult day program 2 days per week and fully enjoying the social contact,  Which has alleviated some of Dorrothy's fatigue and stress  ?

## 2022-03-18 NOTE — Assessment & Plan Note (Signed)
Presumed by ultrasound changes and serologies negative for autoimmune causes of hepatitis.  Current liver enzymes are due and all modifiable risk factors including obesity, diabetes and hyperlipidemia have been addressed .  encouarged to resume atorvastatin 2/week .  Weight loss encouraged with Trial of ozempic ,  Will stop metformin if ozempic is started  ? ?Lab Results  ?Component Value Date  ? ALT 8 03/17/2022  ? AST 13 03/17/2022  ? ALKPHOS 105 11/02/2021  ? BILITOT 0.4 03/17/2022  ? ? ?

## 2022-03-18 NOTE — Assessment & Plan Note (Addendum)
She has been treated by PT from Oct to Dec 2022  for lumbar radiculopathy with improved ROM but not reports persistent pain aggravated by work. . Plain films noted mild degenerative changes in 2022 ?

## 2022-03-18 NOTE — Assessment & Plan Note (Signed)
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

## 2022-03-18 NOTE — Assessment & Plan Note (Addendum)
New onset, normocytic. She will return for screening labs for iron,  B12,  Etc .  Will include screen for MM given new onset CKD ? ?Lab Results  ?Component Value Date  ? WBC 7.4 03/17/2022  ? HGB 11.4 (L) 03/17/2022  ? HCT 34.4 (L) 03/17/2022  ? MCV 93.0 03/17/2022  ? PLT 365 03/17/2022  ? ?No results found for: IRON, TIBC, FERRITIN  ?Lab Results  ?Component Value Date  ? SMOLMBEM75 522 05/07/2018  ? ? ?

## 2022-03-18 NOTE — Assessment & Plan Note (Signed)
GLP 1 agonist therapy trial offered.  ?

## 2022-03-18 NOTE — Assessment & Plan Note (Signed)
Patient was referred in August 2021 to [facility]  for evaluation of LE circulation due to claudication symptoms but declined the service  ?

## 2022-03-18 NOTE — Assessment & Plan Note (Signed)
GFR has been < 60 ml/min for the past year.Reminded  to restrict use of NSAIDs to prn  Not more than 2 times per week and to use tylenol  ? ?Lab Results  ?Component Value Date  ? CREATININE 1.06 (H) 03/17/2022  ? ? ?

## 2022-03-18 NOTE — Assessment & Plan Note (Signed)
Plain films done in 2022 note degenerative changes and osteophytes .  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  Plain films of hips done in 2022  note only mild degenerative changes/ ?

## 2022-03-20 ENCOUNTER — Other Ambulatory Visit (INDEPENDENT_AMBULATORY_CARE_PROVIDER_SITE_OTHER): Payer: Medicare HMO

## 2022-03-20 ENCOUNTER — Other Ambulatory Visit: Payer: Self-pay

## 2022-03-20 ENCOUNTER — Telehealth: Payer: Self-pay

## 2022-03-20 DIAGNOSIS — G8929 Other chronic pain: Secondary | ICD-10-CM | POA: Diagnosis not present

## 2022-03-20 DIAGNOSIS — M545 Low back pain, unspecified: Secondary | ICD-10-CM

## 2022-03-20 DIAGNOSIS — D649 Anemia, unspecified: Secondary | ICD-10-CM

## 2022-03-20 LAB — URINALYSIS, ROUTINE W REFLEX MICROSCOPIC
Bilirubin Urine: NEGATIVE
Hgb urine dipstick: NEGATIVE
Ketones, ur: NEGATIVE
Nitrite: NEGATIVE
RBC / HPF: NONE SEEN (ref 0–?)
Specific Gravity, Urine: 1.025 (ref 1.000–1.030)
Total Protein, Urine: NEGATIVE
Urine Glucose: NEGATIVE
Urobilinogen, UA: 1 (ref 0.0–1.0)
pH: 6 (ref 5.0–8.0)

## 2022-03-20 NOTE — Telephone Encounter (Signed)
error 

## 2022-03-21 ENCOUNTER — Ambulatory Visit (INDEPENDENT_AMBULATORY_CARE_PROVIDER_SITE_OTHER): Payer: Medicare HMO

## 2022-03-21 ENCOUNTER — Inpatient Hospital Stay: Payer: Medicare HMO | Admitting: Hematology and Oncology

## 2022-03-21 ENCOUNTER — Other Ambulatory Visit: Payer: Self-pay

## 2022-03-21 DIAGNOSIS — E1121 Type 2 diabetes mellitus with diabetic nephropathy: Secondary | ICD-10-CM | POA: Diagnosis not present

## 2022-03-21 NOTE — Progress Notes (Addendum)
Pt arrived for Ozempic teaching, video was shown to pt with step by step instructions in how to use pen accurately. After video was shown, demo pen was given to pt to practice. Pt was able to use pen successfully w/o any questions or concerns. She administered her first dose of Ozempic in office and was advised that her next dose is due next wk 03/28/22. Pt verbalized understanding.  ? ?We also went ahead and set up her glucose monitor to her phone which she will be able to track her numbers. She was also taught on how to use it successfully.  ? ?Pt was informed that if she has any further question or is not comfortable in giving her next dose of Ozempic on her own to call our office to schedule another NV to answers any questions she may have and reteach her on the use of Ozempic. ?

## 2022-03-22 ENCOUNTER — Encounter: Payer: Self-pay | Admitting: Internal Medicine

## 2022-03-22 LAB — URINE CULTURE
MICRO NUMBER:: 13183567
SPECIMEN QUALITY:: ADEQUATE

## 2022-03-22 LAB — IMMUNOFIXATION INTE

## 2022-03-24 ENCOUNTER — Ambulatory Visit
Admission: RE | Admit: 2022-03-24 | Discharge: 2022-03-24 | Disposition: A | Discharge status: Home / Self Care | Payer: Medicare HMO | Source: Ambulatory Visit | Attending: General Surgery | Admitting: General Surgery

## 2022-03-24 DIAGNOSIS — Z853 Personal history of malignant neoplasm of breast: Secondary | ICD-10-CM | POA: Diagnosis not present

## 2022-03-24 DIAGNOSIS — R922 Inconclusive mammogram: Secondary | ICD-10-CM | POA: Diagnosis not present

## 2022-03-27 ENCOUNTER — Telehealth: Payer: Self-pay | Admitting: Internal Medicine

## 2022-03-27 DIAGNOSIS — E1121 Type 2 diabetes mellitus with diabetic nephropathy: Secondary | ICD-10-CM

## 2022-03-27 MED ORDER — GLUCOSE BLOOD VI STRP: ORAL_STRIP | 12 refills | Status: DC

## 2022-03-27 NOTE — Telephone Encounter (Signed)
Test strips have been refilled. I do not see documentation in the chart that pt was supposed to stop the metformin since she started the Ozempic.  ?

## 2022-03-27 NOTE — Telephone Encounter (Signed)
Pt called in stating she needs Tru metrix test strips sent to mail order and to stop metformin because she is on ozempic ?

## 2022-03-27 NOTE — Telephone Encounter (Signed)
Spoke with pt and she stated that she has been off of the metformin for a week. She stated that her blood sugars have been staying around 111 112. Pt has a follow up with you in a couple of weeks and blood work on the 12th.  ?

## 2022-03-27 NOTE — Progress Notes (Signed)
? ?Patient Care Team: ?[name], MD as PCP - General (Internal Medicine) ?[name], MD (General Surgery) ?[name], MD as Consulting Physician (Hematology and Oncology) ?Gardenia Phlegm, NP as Nurse Practitioner (Hematology and Oncology) ?Kyung Rudd, MD as Consulting Physician (Radiation Oncology) ?[name], MD as Consulting Physician (General Surgery) ? ?DIAGNOSIS:  ?Encounter Diagnosis  ?Name Primary?  ? Malignant neoplasm of upper-outer quadrant of right breast in female, estrogen receptor positive ([place])   ? ? ?SUMMARY OF ONCOLOGIC HISTORY: ?Oncology History  ?Malignant neoplasm of upper-outer quadrant of right breast in female, estrogen receptor positive ([place])  ?04/09/2017 Mammogram  ? Right breast mass 9:00 position 1 cm from nipple: 4 mm; small asymmetry in the upper outer quadrant measuring 1 cm ?  ?04/20/2017 Initial Diagnosis  ? Right breast stereotactic biopsy: IDC with DCIS, biopsy 9:00 position: [facility], ER 100%, PR 100% HER-2 pending ?  ?05/16/2017 Surgery  ? Right lumpectomy: IDC grade 1, microscopic focus, low-grade DCIS, 0/3 lymph nodes negative, Atypical lobular hyperplasia, ER 100%, PR 100%, HER-2 negative ratio 1.63, Ki-67 insufficient, T82mc, N0 stage IA ?  ?06/18/2017 - 07/17/2017 Radiation Therapy  ? Adjuvant radiation therapy ?  ?09/2017 -  Anti-estrogen oral therapy  ? Anastrozole 177mdaily ?  ? ? ?CHIEF COMPLIANT:  Follow-up of right breast cancer on anastrozole therapy ? ?INTERVAL HISTORY: DoSALLEE HOGREFEs a [age].o. with above-mentioned history of right breast cancer treated with lumpectomy, adjuvant radiation, and who is currently on anastrozole therapy. She presents to the clinic today for follow-up. She tolerating the anastrozole just have some hot flashes. Complains of not sleeping well. ? ? ?ALLERGIES:  has No Known Allergies. ? ?MEDICATIONS:  ?Current Outpatient Medications  ?Medication Sig Dispense Refill  ? anastrozole (ARIMIDEX) 1 MG tablet  Take 1 tablet (1 mg total) by mouth daily. 90 tablet 1  ? Ascorbic Acid (VITAMIN C) 1000 MG tablet Take 1 tablet (1,000 mg total) by mouth daily.    ? aspirin 81 MG tablet Take 81 mg by mouth daily.    ? Blood Glucose Monitoring Suppl (TRUE METRIX AIR GLUCOSE METER) w/Device KIT Use to check blood sugars up to four times daily. 1 kit 0  ? cholecalciferol (VITAMIN D) 25 MCG (1000 UNIT) tablet Take 1,000 Units by mouth daily.    ? glucose blood test strip Use daily to test blood sugar E11.9 trumetrix test strips 100 each 12  ? non-metallic deodorant (ALRA) MISC Apply 1 application topically.    ? nystatin (MYCOSTATIN/NYSTOP) powder Apply 1 application topically 2 (two) times daily. To rash until resolved. 45 g 3  ? Semaglutide,0.25 or 0.5MG/DOS, (OZEMPIC, 0.25 OR 0.5 MG/DOSE,) 2 MG/1.5ML SOPN Inject 0.25 mg into the skin once a week. 1.5 mL 2  ? telmisartan-hydrochlorothiazide (MICARDIS HCT) 80-25 MG tablet TAKE 1 TABLET BY MOUTH IN  THE EVENING 90 tablet 0  ? triamcinolone cream (KENALOG) 0.1 % Apply topically 2 (two) times daily. For itchy rash 45 g 1  ? TRUEplus Lancets 33G MISC Use to check blood sugars up to four times daily. 400 each 3  ? ?No current facility-administered medications for this visit.  ? ? ?PHYSICAL EXAMINATION: ?ECOG PERFORMANCE STATUS: 1 - Symptomatic but completely ambulatory ? ?Vitals:  ? 03/28/22 1004  ?BP: (!) 126/53  ?Pulse: 84  ?Resp: 18  ?Temp: (!) 97.3 ?F (36.3 ?C)  ?SpO2: 99%  ? ?Filed Weights  ? 03/28/22 1004  ?Weight: 185 lb 1.6 oz (84 kg)  ? ? ?  BREAST: No palpable masses or nodules in either right or left breasts. No palpable axillary supraclavicular or infraclavicular adenopathy no breast tenderness or nipple discharge. (exam performed in the presence of a chaperone) ? ?LABORATORY DATA:  ?I have reviewed the data as listed ? ?  Latest Ref Rng & Units 03/17/2022  ?  4:38 PM 11/02/2021  ?  4:05 PM 12/20/2020  ?  9:18 AM  ?CMP  ?Glucose 65 - 99 mg/dL 86   95   120    ?BUN 7 - 25 mg/dL _0 ?Creatinine 0.60 - 1.00 mg/dL 1.06   1.12   1.11    ?Sodium 135 - 146 mmol/L 140   140   138    ?Potassium 3.5 - 5.3 mmol/L 4.7   4.2   4.5    ?Chloride 98 - 110 mmol/L 103   101   102    ?CO2 20 - 32 mmol/L _1 ?Calcium 8.6 - 10.4 mg/dL 10.4   10.3   10.0    ?Total Protein 6.1 - 8.1 g/dL 6.9   7.3     ?Total Bilirubin 0.2 - 1.2 mg/dL 0.4   0.4     ?Alkaline Phos 39 - 117 U/L  105   131    ?AST 10 - 35 U/L 13   14     ?ALT 6 - 29 U/L 8   8     ? ? ?Lab Results  ?Component Value Date  ? WBC 7.4 03/17/2022  ? HGB 11.4 (L) 03/17/2022  ? HCT 34.4 (L) 03/17/2022  ? MCV 93.0 03/17/2022  ? PLT 365 03/17/2022  ? NEUTROABS 4,129 03/17/2022  ? ? ?ASSESSMENT & PLAN:  ?Malignant neoplasm of upper-outer quadrant of right breast in female, estrogen receptor positive ([place]) ?Right breast mass 9:00 position 1 cm from nipple: 4 mm; small asymmetry in the upper outer quadrant measuring 1 cm ?04/19/2017: Right breast stereotactic biopsy: IDC with DCIS, biopsy 9:00 position: [facility], ER 100%, PR 100% HER-2 Neg ?05/21/17: Right lumpectomy: IDC grade 1, microscopic focus, low-grade DCIS, 0/3 lymph nodes negative, Atypical lobular hyperplasia, ER 100%, PR 100%, HER-2 negative ratio 1.63, Ki-67 insufficient, T85mc, N0 stage IA ?  ?Adj XRT 06/18/17- 07/17/17 ?  ?Current Treatment: Anastrozole 1 mg daily will be completed October 2023 ?Anastrozole toxicities: ?1.  Hot flashes: Mild to moderate ?2. Myalgias: No longer an issue ?3.  Decreased sleep ?  ?Breast cancer surveillance: ?1.  Mammogram 03/24/2022 benign breast density category B ?2.  Breast exam 03/28/2022: Benign, scar tissue is the same as previous years. ?  ?Breast lymphedema:  Much better ?  ?RTC in 1 year with long-term survivorship clinic ?  ? ? ? ?No orders of the defined types were placed in this encounter. ? ?The patient has a good understanding of the overall plan. she agrees with it. she will call with any problems that may develop before the next visit  here. ?Total time spent: 30 mins including face to face time and time spent for planning, charting and co-ordination of care ? ? VHarriette Ohara MD ?03/28/22 ? ? ? I DGardiner Coinsam scribing for Dr. GLindi Adie? ?I have reviewed the above documentation for accuracy and completeness, and I agree with the above. ?  ?

## 2022-03-28 ENCOUNTER — Other Ambulatory Visit: Payer: Self-pay | Admitting: Internal Medicine

## 2022-03-28 ENCOUNTER — Other Ambulatory Visit: Payer: Self-pay

## 2022-03-28 ENCOUNTER — Inpatient Hospital Stay: Payer: Medicare HMO | Attending: Hematology and Oncology | Admitting: Hematology and Oncology

## 2022-03-28 DIAGNOSIS — C50411 Malignant neoplasm of upper-outer quadrant of right female breast: Secondary | ICD-10-CM | POA: Insufficient documentation

## 2022-03-28 DIAGNOSIS — N951 Menopausal and female climacteric states: Secondary | ICD-10-CM | POA: Diagnosis not present

## 2022-03-28 DIAGNOSIS — Z170 Estrogen receptor positive status [ER+]: Secondary | ICD-10-CM | POA: Diagnosis not present

## 2022-03-28 DIAGNOSIS — Z923 Personal history of irradiation: Secondary | ICD-10-CM | POA: Diagnosis not present

## 2022-03-28 DIAGNOSIS — R944 Abnormal results of kidney function studies: Secondary | ICD-10-CM

## 2022-03-28 MED ORDER — ANASTROZOLE 1 MG PO TABS: 1.0000 mg | ORAL_TABLET | Freq: Every day | ORAL | 1 refills | Status: DC

## 2022-03-28 NOTE — Telephone Encounter (Signed)
Pt is aware that it is okay to stay off of the metformin and we will check labs at her next lab appt. Pt gave a verbal understanding.  ?

## 2022-03-28 NOTE — Assessment & Plan Note (Signed)
Right breast mass 9:00 position 1 cm from nipple: 4 mm; small asymmetry in the upper outer quadrant measuring 1 cm ?04/19/2017:?Right breast stereotactic biopsy: IDC with DCIS, biopsy 9:00 position: [facility], ER 100%, PR 100% HER-2?Neg ?05/21/17:?Right lumpectomy: IDC grade 1, microscopic focus, low-grade DCIS, 0/3 lymph nodes negative, Atypical lobular hyperplasia, ER 100%, PR 100%, HER-2 negative ratio 1.63, Ki-67 insufficient, T24mc, N0 stage IA ?? ?Adj XRT 06/18/17- 07/17/17 ?? ?Current Treatment: Anastrozole 1 mg daily ?Anastrozole toxicities: ?1.??Hot flashes: Much improved ?2.?Myalgias: No longer an issue ?? ?Breast cancer surveillance: ?1.??Mammogram?03/24/2022 benign breast density category B ?2.??Breast exam 03/28/2022: Benign, scar tissue is the same as previous years. ?? ?Breast lymphedema:??Much better ?? ?RTC in 1 year ?? ?

## 2022-04-05 ENCOUNTER — Other Ambulatory Visit (INDEPENDENT_AMBULATORY_CARE_PROVIDER_SITE_OTHER): Payer: Medicare HMO

## 2022-04-05 DIAGNOSIS — D649 Anemia, unspecified: Secondary | ICD-10-CM | POA: Diagnosis not present

## 2022-04-05 DIAGNOSIS — R944 Abnormal results of kidney function studies: Secondary | ICD-10-CM | POA: Diagnosis not present

## 2022-04-05 DIAGNOSIS — D519 Vitamin B12 deficiency anemia, unspecified: Secondary | ICD-10-CM

## 2022-04-05 LAB — IBC + FERRITIN
Ferritin: 71.5 ng/mL (ref 10.0–291.0)
Iron: 96 ug/dL (ref 42–145)
Saturation Ratios: 32.7 % (ref 20.0–50.0)
TIBC: 294 ug/dL (ref 250.0–450.0)
Transferrin: 210 mg/dL — ABNORMAL LOW (ref 212.0–360.0)

## 2022-04-05 LAB — COMPREHENSIVE METABOLIC PANEL
ALT: 7 U/L (ref 0–35)
AST: 12 U/L (ref 0–37)
Albumin: 4 g/dL (ref 3.5–5.2)
Alkaline Phosphatase: 98 U/L (ref 39–117)
BUN: 21 mg/dL (ref 6–23)
CO2: 30 mEq/L (ref 19–32)
Calcium: 9.8 mg/dL (ref 8.4–10.5)
Chloride: 103 mEq/L (ref 96–112)
Creatinine, Ser: 1.07 mg/dL (ref 0.40–1.20)
GFR: 52.14 mL/min — ABNORMAL LOW (ref >60.00)
Glucose, Bld: 95 mg/dL (ref 70–99)
Potassium: 4.4 mEq/L (ref 3.5–5.1)
Sodium: 139 mEq/L (ref 135–145)
Total Bilirubin: 0.4 mg/dL (ref 0.2–1.2)
Total Protein: 6.6 g/dL (ref 6.0–8.3)

## 2022-04-05 LAB — B12 AND FOLATE PANEL
Folate: 16.6 ng/mL (ref >5.9)
Vitamin B-12: 226 pg/mL (ref 211–911)

## 2022-04-06 ENCOUNTER — Telehealth: Payer: Self-pay

## 2022-04-06 NOTE — Telephone Encounter (Signed)
-----   Message from [name], MD sent at 04/06/2022 10:01 AM EDT ----- ?SO far the anemia workup indicates b12 deficiency.  Please schedule 4 weekly B12 injections and a lab visit to check intrinsic factor antibody ?

## 2022-04-06 NOTE — Telephone Encounter (Signed)
LMTCB. Pt has already seen her mychart message we will just need to get her scheduled for a nurse visit for b12 and lab appt same day.  ?

## 2022-04-06 NOTE — Assessment & Plan Note (Signed)
B12 Is low.  Injections recommended  ?

## 2022-04-06 NOTE — Addendum Note (Signed)
Addended by: [name] on: 04/06/2022 10:02 AM ? ? Modules accepted: Orders ? ?

## 2022-04-10 LAB — PROTEIN ELECTROPHORESIS, SERUM
Albumin ELP: 3.8 g/dL (ref 3.8–4.8)
Alpha 1: 0.3 g/dL (ref 0.2–0.3)
Alpha 2: 0.8 g/dL (ref 0.5–0.9)
Beta 2: 0.4 g/dL (ref 0.2–0.5)
Beta Globulin: 0.4 g/dL (ref 0.4–0.6)
Gamma Globulin: 1.1 g/dL (ref 0.8–1.7)
Total Protein: 6.7 g/dL (ref 6.1–8.1)

## 2022-04-11 ENCOUNTER — Ambulatory Visit (INDEPENDENT_AMBULATORY_CARE_PROVIDER_SITE_OTHER): Payer: Medicare HMO

## 2022-04-11 DIAGNOSIS — E538 Deficiency of other specified B group vitamins: Secondary | ICD-10-CM | POA: Diagnosis not present

## 2022-04-11 MED ORDER — CYANOCOBALAMIN 1000 MCG/ML IJ SOLN
1000.0000 ug | Freq: Once | INTRAMUSCULAR | Status: AC
  Administered 2022-04-11: 1000 ug via INTRAMUSCULAR

## 2022-04-11 NOTE — Progress Notes (Signed)
Patient presented for B 12 injection to left deltoid, patient voiced no concerns nor showed any signs of distress during injection. 

## 2022-04-12 DIAGNOSIS — C50411 Malignant neoplasm of upper-outer quadrant of right female breast: Secondary | ICD-10-CM | POA: Diagnosis not present

## 2022-04-18 ENCOUNTER — Encounter: Payer: Self-pay | Admitting: Internal Medicine

## 2022-04-18 ENCOUNTER — Ambulatory Visit (INDEPENDENT_AMBULATORY_CARE_PROVIDER_SITE_OTHER): Payer: Medicare HMO

## 2022-04-18 DIAGNOSIS — E538 Deficiency of other specified B group vitamins: Secondary | ICD-10-CM | POA: Diagnosis not present

## 2022-04-18 MED ORDER — CYANOCOBALAMIN 1000 MCG/ML IJ SOLN
1000.0000 ug | Freq: Once | INTRAMUSCULAR | Status: AC
  Administered 2022-04-18: 1000 ug via INTRAMUSCULAR

## 2022-04-18 NOTE — Progress Notes (Signed)
Patient presented for B 12 injection to right deltoid, patient voiced no concerns nor showed any signs of distress during injection. 

## 2022-04-25 ENCOUNTER — Ambulatory Visit (INDEPENDENT_AMBULATORY_CARE_PROVIDER_SITE_OTHER): Payer: Medicare HMO

## 2022-04-25 DIAGNOSIS — E538 Deficiency of other specified B group vitamins: Secondary | ICD-10-CM | POA: Diagnosis not present

## 2022-04-25 MED ORDER — CYANOCOBALAMIN 1000 MCG/ML IJ SOLN
1000.0000 ug | Freq: Once | INTRAMUSCULAR | Status: AC
  Administered 2022-04-25: 1000 ug via INTRAMUSCULAR

## 2022-04-25 NOTE — Progress Notes (Signed)
Pt arrived for 3/4 B12 injection, given in L deltoid. Pt tolerated injection well, showed no signs of distress nor voiced any concerns.  

## 2022-05-02 ENCOUNTER — Ambulatory Visit (INDEPENDENT_AMBULATORY_CARE_PROVIDER_SITE_OTHER): Payer: Medicare HMO

## 2022-05-02 ENCOUNTER — Ambulatory Visit: Payer: Medicare HMO

## 2022-05-02 DIAGNOSIS — D519 Vitamin B12 deficiency anemia, unspecified: Secondary | ICD-10-CM

## 2022-05-02 MED ORDER — CYANOCOBALAMIN 1000 MCG/ML IJ SOLN
1000.0000 ug | Freq: Once | INTRAMUSCULAR | Status: AC
  Administered 2022-05-02: 1000 ug via INTRAMUSCULAR

## 2022-05-02 NOTE — Progress Notes (Signed)
Patient presented for B 12 injection to left deltoid, patient voiced no concerns nor showed any signs of distress during injection. 

## 2022-05-05 ENCOUNTER — Encounter: Payer: Self-pay | Admitting: Internal Medicine

## 2022-05-05 ENCOUNTER — Ambulatory Visit (INDEPENDENT_AMBULATORY_CARE_PROVIDER_SITE_OTHER): Payer: Medicare HMO | Admitting: Internal Medicine

## 2022-05-05 DIAGNOSIS — E1121 Type 2 diabetes mellitus with diabetic nephropathy: Secondary | ICD-10-CM | POA: Diagnosis not present

## 2022-05-05 DIAGNOSIS — H6982 Other specified disorders of Eustachian tube, left ear: Secondary | ICD-10-CM | POA: Diagnosis not present

## 2022-05-05 MED ORDER — FLUTICASONE PROPIONATE 50 MCG/ACT NA SUSP: 2.0000 | Freq: Every day | NASAL | 2 refills | Status: DC

## 2022-05-05 MED ORDER — OZEMPIC (0.25 OR 0.5 MG/DOSE) 2 MG/3ML ~~LOC~~ SOPN: 0.5000 mg | PEN_INJECTOR | SUBCUTANEOUS | 2 refills | Status: DC

## 2022-05-05 NOTE — Patient Instructions (Signed)
1) Resume Flonase  for Eustachian tube dysfunction  which is what's causing your ear to feel plugged up ? ?You can also add afrin nasal spray for for few days if needed ( 5 max) ? ?2) Increase the Ozempic dose to 0.5 mg weekly.  Stay at that dose for at least 4 weeks.  Request an increase in dose after 4 weeks if you have not lost weight  ?

## 2022-05-05 NOTE — Progress Notes (Signed)
? ?Subjective:  ?Patient ID: Martha [name], female    DOB: 06-27-1951  Age: 71 y.o. MRN: [id number] ? ?CC: Diagnoses of Eustachian tube dysfunction, left and Controlled type 2 diabetes mellitus with diabetic nephropathy, without long-term current use of insulin ([place]) were pertinent to this visit. ? ? ?HPI ?ROSELL KHOURI presents for evaluation of left ear fullness and 2) follow up on obesity with diabetes  ?Chief Complaint  ?Patient presents with  ? Acute Visit  ?  Ear clogged  ? ?71 yr old female presents with feeling of fluid behind ear for several weeks . Denies pain , fevers, and recent sinus congestion.  Hearing feels muffled slightly form left ear . No risk factors : no swimming,  no flying. No foreign objects inserted into ear.  ? ?2) T2DM:   ?T2DM:  She  feels generally well,  But is not  exercising regularly or trying to lose weight. Checking  blood sugars less than once daily at variable times, usually only if she feels she may be having a hypoglycemic event. .  BS have been under 130 fasting and < 150 post prandially.  Denies any recent hypoglyemic events.  Taking   medications as directed. Following a carbohydrate modified diet 6 days per week. Denies numbness, burning and tingling of extremities. Appetite is good.   ? ?Outpatient Medications Prior to Visit  ?Medication Sig Dispense Refill  ? anastrozole (ARIMIDEX) 1 MG tablet Take 1 tablet (1 mg total) by mouth daily. 90 tablet 1  ? Ascorbic Acid (VITAMIN C) 1000 MG tablet Take 1 tablet (1,000 mg total) by mouth daily.    ? aspirin 81 MG tablet Take 81 mg by mouth daily.    ? Blood Glucose Monitoring Suppl (TRUE METRIX AIR GLUCOSE METER) w/Device KIT Use to check blood sugars up to four times daily. 1 kit 0  ? cholecalciferol (VITAMIN D) 25 MCG (1000 UNIT) tablet Take 1,000 Units by mouth daily.    ? glucose blood test strip Use daily to test blood sugar E11.9 trumetrix test strips 100 each 12  ? non-metallic deodorant (ALRA) MISC Apply 1 application  topically.    ? nystatin (MYCOSTATIN/NYSTOP) powder Apply 1 application topically 2 (two) times daily. To rash until resolved. 45 g 3  ? telmisartan-hydrochlorothiazide (MICARDIS HCT) 80-25 MG tablet TAKE 1 TABLET BY MOUTH IN  THE EVENING 90 tablet 0  ? triamcinolone cream (KENALOG) 0.1 % Apply topically 2 (two) times daily. For itchy rash 45 g 1  ? TRUEplus Lancets 33G MISC Use to check blood sugars up to four times daily. 400 each 3  ? OZEMPIC, 0.25 OR 0.5 MG/DOSE, 2 MG/3ML SOPN SMARTSIG:0.25 Milligram(s) SUB-Q Once a Week    ? Semaglutide,0.25 or 0.5MG/DOS, (OZEMPIC, 0.25 OR 0.5 MG/DOSE,) 2 MG/1.5ML SOPN Inject 0.25 mg into the skin once a week. (Patient not taking: Reported on 05/05/2022) 1.5 mL 2  ? ?No facility-administered medications prior to visit.  ? ? ?Review of Systems; ? ?Patient denies headache, fevers, malaise, unintentional weight loss, skin rash, eye pain, sinus congestion and sinus pain, sore throat, dysphagia,  hemoptysis , cough, dyspnea, wheezing, chest pain, palpitations, orthopnea, edema, abdominal pain, nausea, melena, diarrhea, constipation, flank pain, dysuria, hematuria, urinary  Frequency, nocturia, numbness, tingling, seizures,  Focal weakness, Loss of consciousness,  Tremor, insomnia, depression, anxiety, and suicidal ideation.   ? ? ? ?Objective:  ?BP 130/70 (BP Location: Left Arm, Patient Position: Sitting, Cuff Size: Large)   Pulse 93   Temp  97.9 ?F (36.6 ?C) (Oral)   Ht '5\' 2"'  (1.575 m)   Wt 191 lb (86.6 kg)   SpO2 99%   BMI 34.93 kg/m?  ? ?BP Readings from Last 3 Encounters:  ?05/05/22 130/70  ?03/28/22 (!) 126/53  ?03/17/22 130/68  ? ? ?Wt Readings from Last 3 Encounters:  ?05/05/22 191 lb (86.6 kg)  ?03/28/22 185 lb 1.6 oz (84 kg)  ?03/17/22 189 lb (85.7 kg)  ? ? ?General appearance: alert, cooperative and appears stated age ?Ears: bilateral scarred,  dull  TM's and external ear canals both ears ?Throat: lips, mucosa, and tongue normal; teeth and gums normal ?Neck: no  adenopathy, no carotid bruit, supple, symmetrical, trachea midline and thyroid not enlarged, symmetric, no tenderness/mass/nodules ?Back: symmetric, no curvature. ROM normal. No CVA tenderness. ?Lungs: clear to auscultation bilaterally ?Heart: regular rate and rhythm, S1, S2 normal, no murmur, click, rub or gallop ?Abdomen: soft, non-tender; bowel sounds normal; no masses,  no organomegaly ?Pulses: 2+ and symmetric ?Skin: Skin color, texture, turgor normal. No rashes or lesions ?Lymph nodes: Cervical, supraclavicular, and axillary nodes normal. ? ?Lab Results  ?Component Value Date  ? HGBA1C 6.2 (H) 03/17/2022  ? HGBA1C 6.2 (A) 11/03/2021  ? HGBA1C 6.7 (H) 12/06/2020  ? ? ?Lab Results  ?Component Value Date  ? CREATININE 1.07 04/05/2022  ? CREATININE 1.06 (H) 03/17/2022  ? CREATININE 1.12 11/02/2021  ? ? ?Lab Results  ?Component Value Date  ? WBC 7.4 03/17/2022  ? HGB 11.4 (L) 03/17/2022  ? HCT 34.4 (L) 03/17/2022  ? PLT 365 03/17/2022  ? GLUCOSE 95 04/05/2022  ? CHOL 210 (H) 03/17/2022  ? TRIG 116 03/17/2022  ? HDL 66 03/17/2022  ? LDLDIRECT 71.0 09/14/2017  ? LDLCALC 122 (H) 03/17/2022  ? ALT 7 04/05/2022  ? AST 12 04/05/2022  ? NA 139 04/05/2022  ? K 4.4 04/05/2022  ? CL 103 04/05/2022  ? CREATININE 1.07 04/05/2022  ? BUN 21 04/05/2022  ? CO2 30 04/05/2022  ? TSH 1.20 11/02/2021  ? HGBA1C 6.2 (H) 03/17/2022  ? MICROALBUR <0.7 11/02/2021  ? ? ?MM DIAG BREAST TOMO BILATERAL ? ?Result Date: 03/24/2022 ?CLINICAL DATA:  History of RIGHT breast cancer in 2018 status post lumpectomy and radiation therapy. EXAM: DIGITAL DIAGNOSTIC BILATERAL MAMMOGRAM WITH TOMOSYNTHESIS AND CAD TECHNIQUE: Bilateral digital diagnostic mammography and breast tomosynthesis was performed. The images were evaluated with computer-aided detection. COMPARISON:  Previous exam(s). ACR Breast Density Category b: There are scattered areas of fibroglandular density. FINDINGS: There are stable postsurgical changes within the RIGHT breast. There are no  new dominant masses, suspicious calcifications or secondary signs of malignancy within either breast. IMPRESSION: No evidence of malignancy within either breast. Stable postsurgical changes within the RIGHT breast. RECOMMENDATION: 1.  Screening mammogram in one year.(Code:[id number]-B-01Y) 2. Per protocol, as the patient is now 2 or more years status post lumpectomy, she may return to annual screening mammography in 1 year. However, given the history of breast cancer, the patient remains eligible for annual diagnostic mammography if preferred. I have discussed the findings and recommendations with the patient. If applicable, a reminder letter will be sent to the patient regarding the next appointment. BI-RADS CATEGORY  2: Benign. Electronically Signed   By: Franki Cabot M.D.   On: 03/24/2022 11:06  ? ? ?Assessment & Plan:  ? ?Problem List Items Addressed This Visit   ? ? Controlled type 2 diabetes mellitus with diabetic nephropathy, without long-term current use of insulin ([place])  ?  Glycemic control remains excellent  with Metformin XR but she remains morbidly obese  She has gained weight on the starting dose of Ozempic and has no side effects. Thus far.  Will increase Ozempic dose to 05 mg weekly for minimum of 4 weeks      ? ?Lab Results  ?Component Value Date  ? HGBA1C 6.2 (H) 03/17/2022  ? ?Lab Results  ?Component Value Date  ? MICROALBUR <0.7 11/02/2021  ? ? ?  ?  ? Relevant Medications  ? OZEMPIC, 0.25 OR 0.5 MG/DOSE, 2 MG/3ML SOPN  ? Eustachian tube dysfunction, left  ?  Trial of flonase and sudafed pe ? ?  ?  ? ? ?Follow-up: Return in about 3 months (around 08/05/2022) for follow up diabetes. ? ? ?[name], MD ?

## 2022-05-06 LAB — INTRINSIC FACTOR ANTIBODIES: Intrinsic Factor: POSITIVE — AB

## 2022-05-07 ENCOUNTER — Encounter: Payer: Self-pay | Admitting: Internal Medicine

## 2022-05-07 DIAGNOSIS — H6982 Other specified disorders of Eustachian tube, left ear: Secondary | ICD-10-CM | POA: Insufficient documentation

## 2022-05-07 DIAGNOSIS — H6992 Unspecified Eustachian tube disorder, left ear: Secondary | ICD-10-CM | POA: Insufficient documentation

## 2022-05-07 NOTE — Assessment & Plan Note (Signed)
Glycemic control remains excellent  with Metformin XR but she remains morbidly obese  She has gained weight on the starting dose of Ozempic and has no side effects. Thus far.  Will increase Ozempic dose to 05 mg weekly for minimum of 4 weeks      ? ?Lab Results  ?Component Value Date  ? HGBA1C 6.2 (H) 03/17/2022  ? ?Lab Results  ?Component Value Date  ? MICROALBUR <0.7 11/02/2021  ? ? ?

## 2022-05-07 NOTE — Assessment & Plan Note (Signed)
Trial of flonase and sudafed pe ?

## 2022-05-18 DIAGNOSIS — C50411 Malignant neoplasm of upper-outer quadrant of right female breast: Secondary | ICD-10-CM | POA: Diagnosis not present

## 2022-06-16 ENCOUNTER — Telehealth: Payer: Self-pay | Admitting: Internal Medicine

## 2022-06-16 DIAGNOSIS — E1121 Type 2 diabetes mellitus with diabetic nephropathy: Secondary | ICD-10-CM

## 2022-06-16 NOTE — Telephone Encounter (Signed)
Patient has a lab appt 06/19/2022, there are no orders in.

## 2022-06-19 ENCOUNTER — Other Ambulatory Visit (INDEPENDENT_AMBULATORY_CARE_PROVIDER_SITE_OTHER): Payer: Medicare HMO

## 2022-06-19 DIAGNOSIS — E1121 Type 2 diabetes mellitus with diabetic nephropathy: Secondary | ICD-10-CM

## 2022-06-19 LAB — LIPID PANEL
Cholesterol: 207 mg/dL — ABNORMAL HIGH (ref 0–200)
HDL: 64.5 mg/dL (ref >39.00)
LDL Cholesterol: 123 mg/dL — ABNORMAL HIGH (ref 0–99)
NonHDL: 142.28
Total CHOL/HDL Ratio: 3
Triglycerides: 94 mg/dL (ref 0.0–149.0)
VLDL: 18.8 mg/dL (ref 0.0–40.0)

## 2022-06-19 LAB — COMPREHENSIVE METABOLIC PANEL
ALT: 9 U/L (ref 0–35)
AST: 12 U/L (ref 0–37)
Albumin: 3.9 g/dL (ref 3.5–5.2)
Alkaline Phosphatase: 98 U/L (ref 39–117)
BUN: 22 mg/dL (ref 6–23)
CO2: 30 mEq/L (ref 19–32)
Calcium: 9.8 mg/dL (ref 8.4–10.5)
Chloride: 102 mEq/L (ref 96–112)
Creatinine, Ser: 1.13 mg/dL (ref 0.40–1.20)
GFR: 48.77 mL/min — ABNORMAL LOW (ref >60.00)
Glucose, Bld: 108 mg/dL — ABNORMAL HIGH (ref 70–99)
Potassium: 4.1 mEq/L (ref 3.5–5.1)
Sodium: 139 mEq/L (ref 135–145)
Total Bilirubin: 0.5 mg/dL (ref 0.2–1.2)
Total Protein: 6.5 g/dL (ref 6.0–8.3)

## 2022-06-19 LAB — HEMOGLOBIN A1C: Hgb A1c MFr Bld: 6.1 % (ref 4.6–6.5)

## 2022-06-20 ENCOUNTER — Encounter: Payer: Self-pay | Admitting: Internal Medicine

## 2022-07-05 ENCOUNTER — Other Ambulatory Visit: Payer: Self-pay

## 2022-07-05 MED ORDER — CYCLOBENZAPRINE HCL 10 MG PO TABS: ORAL_TABLET | ORAL | 1 refills | Status: DC

## 2022-07-14 ENCOUNTER — Other Ambulatory Visit: Payer: Self-pay | Admitting: Internal Medicine

## 2022-07-14 DIAGNOSIS — E1121 Type 2 diabetes mellitus with diabetic nephropathy: Secondary | ICD-10-CM

## 2022-07-31 ENCOUNTER — Other Ambulatory Visit: Payer: Self-pay | Admitting: Internal Medicine

## 2022-08-11 ENCOUNTER — Other Ambulatory Visit: Payer: Self-pay | Admitting: Internal Medicine

## 2022-08-11 DIAGNOSIS — H26493 Other secondary cataract, bilateral: Secondary | ICD-10-CM | POA: Diagnosis not present

## 2022-08-11 DIAGNOSIS — H43813 Vitreous degeneration, bilateral: Secondary | ICD-10-CM | POA: Diagnosis not present

## 2022-08-11 DIAGNOSIS — E119 Type 2 diabetes mellitus without complications: Secondary | ICD-10-CM | POA: Diagnosis not present

## 2022-08-11 DIAGNOSIS — M3501 Sicca syndrome with keratoconjunctivitis: Secondary | ICD-10-CM | POA: Diagnosis not present

## 2022-08-11 DIAGNOSIS — Z0100 Encounter for examination of eyes and vision without abnormal findings: Secondary | ICD-10-CM | POA: Diagnosis not present

## 2022-08-11 LAB — HM DIABETES EYE EXAM

## 2022-08-13 ENCOUNTER — Other Ambulatory Visit: Payer: Self-pay | Admitting: Internal Medicine

## 2022-08-14 ENCOUNTER — Telehealth: Payer: Self-pay | Admitting: Internal Medicine

## 2022-08-14 NOTE — Telephone Encounter (Signed)
Pt called stating her ozempic has reached its cap and they want to charge her 189 a month and she can not afford it and she is suppose to take her dose tomorrow. Pt want to know If she should start going back to metformin. Pt would like to be called.

## 2022-08-15 NOTE — Telephone Encounter (Signed)
Spoke with Cristie Hem at [facility] to get clarification  on what was meant by reaching her cap. Alex stated that with Medicare they only cover so much on this medication and then the pt has to start paying "some" out of pocket utnil she reaches a certain amount and then the insurance will take back over covering 100% again. Pt has reached her cap and the cost to her will now be $189 a month until she reaches her out of pocket amount, not sure what the is. Pt is wanting to know if she should go back to taking metformin.

## 2022-08-16 NOTE — Telephone Encounter (Signed)
Patient was in office and was going to ask Dr Derrel Nip when she goes back with mother.

## 2022-08-22 NOTE — Telephone Encounter (Signed)
Medication has been picked up by pt.  

## 2022-08-22 NOTE — Telephone Encounter (Signed)
Spoke with pt to let her know that we have sample for her to pick up. Pt stated that she would be by today to pick up.

## 2022-08-22 NOTE — Telephone Encounter (Signed)
Medication Samples have been provided to the patient.  Drug name: Ozempic       Strength: 0.5 mg        Qty: 1 box  LOT: [id number]  Exp.Date: 01/25/2024  Dosing instructions: Inject 0.5 mg into skin once weekly.  The patient has been instructed regarding the correct time, dose, and frequency of taking this medication, including desired effects and most common side effects.   Martha [name] 11:26 AM 08/22/2022

## 2022-08-30 ENCOUNTER — Ambulatory Visit (INDEPENDENT_AMBULATORY_CARE_PROVIDER_SITE_OTHER): Payer: Medicare HMO | Admitting: Internal Medicine

## 2022-08-30 ENCOUNTER — Encounter: Payer: Self-pay | Admitting: Internal Medicine

## 2022-08-30 VITALS — BP 118/60 | HR 90 | Temp 97.8°F | Ht 62.0 in | Wt 190.0 lb

## 2022-08-30 DIAGNOSIS — E1121 Type 2 diabetes mellitus with diabetic nephropathy: Secondary | ICD-10-CM

## 2022-08-30 DIAGNOSIS — Z6836 Body mass index (BMI) 36.0-36.9, adult: Secondary | ICD-10-CM | POA: Diagnosis not present

## 2022-08-30 DIAGNOSIS — E1122 Type 2 diabetes mellitus with diabetic chronic kidney disease: Secondary | ICD-10-CM

## 2022-08-30 DIAGNOSIS — T466X5A Adverse effect of antihyperlipidemic and antiarteriosclerotic drugs, initial encounter: Secondary | ICD-10-CM

## 2022-08-30 DIAGNOSIS — M791 Myalgia, unspecified site: Secondary | ICD-10-CM

## 2022-08-30 DIAGNOSIS — I10 Essential (primary) hypertension: Secondary | ICD-10-CM | POA: Diagnosis not present

## 2022-08-30 DIAGNOSIS — R21 Rash and other nonspecific skin eruption: Secondary | ICD-10-CM | POA: Diagnosis not present

## 2022-08-30 DIAGNOSIS — M25562 Pain in left knee: Secondary | ICD-10-CM | POA: Insufficient documentation

## 2022-08-30 DIAGNOSIS — N182 Chronic kidney disease, stage 2 (mild): Secondary | ICD-10-CM | POA: Diagnosis not present

## 2022-08-30 NOTE — Progress Notes (Addendum)
Subjective:  Patient ID: Martha [name], female    DOB: 05-05-1951  Age: 71 y.o. MRN: [id number]  CC: The primary encounter diagnosis was Left medial knee pain. Diagnoses of Rash and nonspecific skin eruption, CKD stage 2 due to type 2 diabetes mellitus ([place]), Class 2 severe obesity due to excess calories with serious comorbidity and body mass index (BMI) of 36.0 to 36.9 in adult [facility]), Controlled type 2 diabetes mellitus with diabetic nephropathy, without long-term current use of insulin ([place]), Essential hypertension, benign, and Myalgia due to statin were also pertinent to this visit.   HPI Martha [name] presents for  Chief Complaint  Patient presents with   Acute Visit    Left knee pain after standing at work for 6 hours   1) Type 2 DM with CKD :  she has been taking ozempic,  currently just started the dose of 0.5 mg ozempic, lipitor 3 times weekly and tolerating it, and   telmsartan .  Post prandial sugars 140 or less   She walks in the pool for exercise but not daily, no other exercise.  Has gained 5 lbs. Discussed role of exercise  reviewed diet.    2) left medial knee pain aggravated by prolonged standing at work.  By the end of the day,  the area is tender to palpation .  She has been using tylenol ,  heat.  .  Make a referral to [facility], saw the for hip pain   3) dry patches of skin on forearms,  shoulders,   knee   wants to see dermatology  4) mother's dementia getting worse  goes off topic.    5) Obesity:  reviewed diet and exercise regimen   Outpatient Medications Prior to Visit  Medication Sig Dispense Refill   anastrozole (ARIMIDEX) 1 MG tablet Take 1 tablet (1 mg total) by mouth daily. 90 tablet 1   Ascorbic Acid (VITAMIN C) 1000 MG tablet Take 1 tablet (1,000 mg total) by mouth daily.     aspirin 81 MG tablet Take 81 mg by mouth daily.     Blood Glucose Monitoring Suppl (TRUE METRIX AIR GLUCOSE METER) w/Device KIT Use to check blood sugars up to four times  daily. 1 kit 0   cholecalciferol (VITAMIN D) 25 MCG (1000 UNIT) tablet Take 1,000 Units by mouth daily.     cyclobenzaprine (FLEXERIL) 10 MG tablet TAKE 1 TABLET BY MOUTH 3 TIMES DAILY AS NEEDED FOR MUSCLE SPASM(S) 90 tablet 1   fluticasone (FLONASE) 50 MCG/ACT nasal spray SPRAY 2 SPRAYS INTO EACH NOSTRIL EVERY DAY 48 mL 0   nystatin (MYCOSTATIN/NYSTOP) powder Apply 1 application topically 2 (two) times daily. To rash until resolved. 45 g 3   OZEMPIC, 0.25 OR 0.5 MG/DOSE, 2 MG/3ML SOPN INJECT 0.5 MG INTO THE SKIN ONCE A WEEK. 3 mL 2   telmisartan-hydrochlorothiazide (MICARDIS HCT) 80-25 MG tablet TAKE 1 TABLET BY MOUTH IN  THE EVENING 90 tablet 0   triamcinolone cream (KENALOG) 0.1 % Apply topically 2 (two) times daily. For itchy rash 45 g 1   TRUE METRIX BLOOD GLUCOSE TEST test strip CHECK BLOOD SUGAR  UP  TO FOUR TIMES DAILY 100 strip 1   TRUEplus Lancets 33G MISC Use to check blood sugars up to four times daily. 400 each 3   non-metallic deodorant (ALRA) MISC Apply 1 application topically. (Patient not taking: Reported on 08/30/2022)     No facility-administered medications prior to visit.    Review  of Systems;  Patient denies headache, fevers, malaise, unintentional weight loss, skin rash, eye pain, sinus congestion and sinus pain, sore throat, dysphagia,  hemoptysis , cough, dyspnea, wheezing, chest pain, palpitations, orthopnea, edema, abdominal pain, nausea, melena, diarrhea, constipation, flank pain, dysuria, hematuria, urinary  Frequency, nocturia, numbness, tingling, seizures,  Focal weakness, Loss of consciousness,  Tremor, insomnia, depression, anxiety, and suicidal ideation.      Objective:  BP 118/60 (BP Location: Left Arm, Patient Position: Sitting, Cuff Size: Large)   Pulse 90   Temp 97.8 F (36.6 C) (Oral)   Ht '5\' 2"'  (1.575 m)   Wt 190 lb (86.2 kg)   SpO2 96%   BMI 34.75 kg/m   BP Readings from Last 3 Encounters:  08/30/22 118/60  05/05/22 130/70  03/28/22 (!) 126/53     Wt Readings from Last 3 Encounters:  08/30/22 190 lb (86.2 kg)  05/05/22 191 lb (86.6 kg)  03/28/22 185 lb 1.6 oz (84 kg)    General appearance: alert, cooperative and appears stated age Ears: normal TM's and external ear canals both ears Throat: lips, mucosa, and tongue normal; teeth and gums normal Neck: no adenopathy, no carotid bruit, supple, symmetrical, trachea midline and thyroid not enlarged, symmetric, no tenderness/mass/nodules Back: symmetric, no curvature. ROM normal. No CVA tenderness. Lungs: clear to auscultation bilaterally Heart: regular rate and rhythm, S1, S2 normal, no murmur, click, rub or gallop Abdomen: soft, non-tender; bowel sounds normal; no masses,  no organomegaly Pulses: 2+ and symmetric Skin: Skin color,, turgor normal.  Macular patchds of rough, dry skin scattered on legs and arms Lymph nodes: Cervical, supraclavicular, and axillary nodes normal.  Lab Results  Component Value Date   HGBA1C 6.1 06/19/2022   HGBA1C 6.2 (H) 03/17/2022   HGBA1C 6.2 (A) 11/03/2021    Lab Results  Component Value Date   CREATININE 1.13 06/19/2022   CREATININE 1.07 04/05/2022   CREATININE 1.06 (H) 03/17/2022    Lab Results  Component Value Date   WBC 7.4 03/17/2022   HGB 11.4 (L) 03/17/2022   HCT 34.4 (L) 03/17/2022   PLT 365 03/17/2022   GLUCOSE 108 (H) 06/19/2022   CHOL 207 (H) 06/19/2022   TRIG 94.0 06/19/2022   HDL 64.50 06/19/2022   LDLDIRECT 71.0 09/14/2017   LDLCALC 123 (H) 06/19/2022   ALT 9 06/19/2022   AST 12 06/19/2022   NA 139 06/19/2022   K 4.1 06/19/2022   CL 102 06/19/2022   CREATININE 1.13 06/19/2022   BUN 22 06/19/2022   CO2 30 06/19/2022   TSH 1.20 11/02/2021   HGBA1C 6.1 06/19/2022   MICROALBUR <0.7 11/02/2021    MM DIAG BREAST TOMO BILATERAL  Result Date: 03/24/2022 CLINICAL DATA:  History of RIGHT breast cancer in 2018 status post lumpectomy and radiation therapy. EXAM: DIGITAL DIAGNOSTIC BILATERAL MAMMOGRAM WITH TOMOSYNTHESIS  AND CAD TECHNIQUE: Bilateral digital diagnostic mammography and breast tomosynthesis was performed. The images were evaluated with computer-aided detection. COMPARISON:  Previous exam(s). ACR Breast Density Category b: There are scattered areas of fibroglandular density. FINDINGS: There are stable postsurgical changes within the RIGHT breast. There are no new dominant masses, suspicious calcifications or secondary signs of malignancy within either breast. IMPRESSION: No evidence of malignancy within either breast. Stable postsurgical changes within the RIGHT breast. RECOMMENDATION: 1.  Screening mammogram in one year.(Code:SM-B-01Y) 2. Per protocol, as the patient is now 2 or more years status post lumpectomy, she may return to annual screening mammography in 1 year. However, given the history of  breast cancer, the patient remains eligible for annual diagnostic mammography if preferred. I have discussed the findings and recommendations with the patient. If applicable, a reminder letter will be sent to the patient regarding the next appointment. BI-RADS CATEGORY  2: Benign. Electronically Signed   By: Franki Cabot M.D.   On: 03/24/2022 11:06    Assessment & Plan:   Problem List Items Addressed This Visit     CKD stage 2 due to type 2 diabetes mellitus ([place])    She is avoiding all NSAIDS orally,  But has been advised to increase water intake      Class 2 severe obesity due to excess calories with serious comorbidity and body mass index (BMI) of 36.0 to 36.9 in adult [facility])    She has gained 5 lbs since April despite April start of  ozempic due to lack of exercise       Controlled type 2 diabetes mellitus with diabetic nephropathy, without long-term current use of insulin ([facility])     Glycemic control remains excellent  with Metformin XRand Ozempic,  but she has not lost weight and has no side effects. Thus far.  Will increase Ozempic dose to 0. 5 mg weekly for minimum of 4 weeks       Lab Results   Component Value Date   HGBA1C 6.1 06/19/2022   Lab Results  Component Value Date   MICROALBUR <0.7 11/02/2021         Essential hypertension, benign    Well controlled on current regimen of telmisartan .  Renal function slightly low but stable, no changes today.      Left medial knee pain - Primary    Secondary to OA aggravated by prolonged standing and obesity. encouraged to lose weight and start exercising . Continue tylenol,  Trial of diclofenac gel,  Ortho consult       Relevant Orders   Ambulatory referral to Orthopedic Surgery   Myalgia due to statin    She is tolerating every other day lipitor      Rash and nonspecific skin eruption    She has multiple areas of dry itchy skin without pigment change, occurring on lateral thigh. knee, shoulder,  And forearms.  using topical triamcinolone with transient relief. No signs of ringworm or psoriasis or eczema.  referring  To dermatology.       Relevant Orders   Ambulatory referral to Dermatology    I spent a total of  30  minutes with this patient in a face to face visit on the date of this encounter reviewing the last office visit with me in May,   patient's diet and eating habits, home blood sugar readings  ,  most recent imaging study ,   and post visit ordering of testing and therapeutics.    Follow-up: No follow-ups on file.   [name], MD

## 2022-08-30 NOTE — Assessment & Plan Note (Signed)
She is avoiding all NSAIDS orally,  But has been advised to increase water intake

## 2022-08-30 NOTE — Assessment & Plan Note (Signed)
Glycemic control remains excellent  with Metformin XRand Ozempic,  but she has not lost weight and has no side effects. Thus far.  Will increase Ozempic dose to 0. 5 mg weekly for minimum of 4 weeks       Lab Results  Component Value Date   HGBA1C 6.1 06/19/2022   Lab Results  Component Value Date   MICROALBUR <0.7 11/02/2021

## 2022-08-30 NOTE — Assessment & Plan Note (Signed)
She has gained 5 lbs since April despite April start of  ozempic due to lack of exercise

## 2022-08-30 NOTE — Assessment & Plan Note (Signed)
Well controlled on current regimen of telmisartan .  Renal function slightly low but stable, no changes today.

## 2022-08-30 NOTE — Assessment & Plan Note (Signed)
She is tolerating every other day lipitor

## 2022-08-30 NOTE — Patient Instructions (Addendum)
Continue ozempic 0.5 mg weekly for the next month and send me  your weight on your scale in one month  Start exercising !     Referral to [facility] and [facility] are in progress

## 2022-08-30 NOTE — Assessment & Plan Note (Signed)
She has multiple areas of dry itchy skin without pigment change, occurring on lateral thigh. knee, shoulder,  And forearms.  using topical triamcinolone with transient relief. No signs of ringworm or psoriasis or eczema.  referring  To dermatology.

## 2022-08-30 NOTE — Assessment & Plan Note (Signed)
Secondary to OA aggravated by prolonged standing and obesity. encouraged to lose weight and start exercising . Continue tylenol,  Trial of diclofenac gel,  Ortho consult

## 2022-09-05 DIAGNOSIS — M25561 Pain in right knee: Secondary | ICD-10-CM | POA: Diagnosis not present

## 2022-09-05 DIAGNOSIS — M25562 Pain in left knee: Secondary | ICD-10-CM | POA: Diagnosis not present

## 2022-09-13 ENCOUNTER — Telehealth: Payer: Self-pay | Admitting: Internal Medicine

## 2022-09-13 NOTE — Telephone Encounter (Signed)
Pt called stating she was referred to a dermatologist but they can not see her until January

## 2022-09-13 NOTE — Telephone Encounter (Signed)
Does pt need to be seen more urgently? Is there another dermatologist we can refer her to?

## 2022-09-14 NOTE — Telephone Encounter (Signed)
LMTCB

## 2022-09-14 NOTE — Telephone Encounter (Signed)
Pt stated that the rash is spreading. Pt also stated that she is scheduled to see you again on Monday and will show you the rash again then.

## 2022-09-18 ENCOUNTER — Ambulatory Visit (INDEPENDENT_AMBULATORY_CARE_PROVIDER_SITE_OTHER): Payer: Medicare HMO | Admitting: Internal Medicine

## 2022-09-18 ENCOUNTER — Encounter: Payer: Self-pay | Admitting: Internal Medicine

## 2022-09-18 ENCOUNTER — Other Ambulatory Visit: Payer: Self-pay | Admitting: Internal Medicine

## 2022-09-18 VITALS — BP 122/68 | HR 75 | Temp 97.9°F | Ht 62.0 in | Wt 187.4 lb

## 2022-09-18 DIAGNOSIS — E1121 Type 2 diabetes mellitus with diabetic nephropathy: Secondary | ICD-10-CM

## 2022-09-18 DIAGNOSIS — I10 Essential (primary) hypertension: Secondary | ICD-10-CM | POA: Diagnosis not present

## 2022-09-18 DIAGNOSIS — R21 Rash and other nonspecific skin eruption: Secondary | ICD-10-CM

## 2022-09-18 DIAGNOSIS — E7800 Pure hypercholesterolemia, unspecified: Secondary | ICD-10-CM | POA: Diagnosis not present

## 2022-09-18 MED ORDER — RSVPREF3 VAC RECOMB ADJUVANTED 120 MCG/0.5ML IM SUSR
0.5000 mL | Freq: Once | INTRAMUSCULAR | 0 refills | Status: AC
Start: 2022-09-18 — End: 2022-09-18

## 2022-09-18 MED ORDER — ZOSTER VAC RECOMB ADJUVANTED 50 MCG/0.5ML IM SUSR: 0.5000 mL | Freq: Once | INTRAMUSCULAR | 0 refills | Status: AC

## 2022-09-18 NOTE — Patient Instructions (Addendum)
We will try to get your dermatology appointment moved up.  Stop all moisturizers but  Eucerin,  use it twice daily  all over legs and arms   You can return after Sept 26 for your labs   I do recommend the RSV vaccine for you, to protect you against "Respiratory Syncytial Virus"   .  It is now available at  [facility] and [facility]

## 2022-09-18 NOTE — Assessment & Plan Note (Signed)
Glycemic control remains excellent  with Metformin XR and Ozempic  0. 5 mg weekly    Lab Results  Component Value Date   HGBA1C 6.1 06/19/2022   Lab Results  Component Value Date   MICROALBUR <0.7 11/02/2021

## 2022-09-18 NOTE — Assessment & Plan Note (Signed)
The skin condition appears to be due to xerosis and lichenification. Advised to use Eucerin daily to twice daily  And follow up with dermatology

## 2022-09-18 NOTE — Progress Notes (Signed)
Subjective:  Patient ID: Martha [name], female    DOB: 20-Sep-1951  Age: 71 y.o. MRN: [id number]  CC: The primary encounter diagnosis was Essential hypertension, benign. Diagnoses of Controlled type 2 diabetes mellitus with diabetic nephropathy, without long-term current use of insulin ([place]), Pure hypercholesterolemia, and Rash and nonspecific skin eruption were also pertinent to this visit.   HPI Martha [name] presents for  Chief Complaint  Patient presents with   Follow-up    6 month follow up     1) persistent pruritic macular rash on legs and arms since early summer .  The rash has not improved with steroid cream and she has new areas on her thighs ,.   2) Obesity:;  losing weight with dietary changes:  she has cut out bread ,  eating eggwhich's.  Has changed to michelob ultra beer has one  daily at the most   3) tyoe 2 DM:  She  feels generally well,  But is not  exercising regularly or trying to lose weight. Checking  blood sugars less than once daily at variable times, usually only if she feels she may be having a hypoglycemic event. .  BS have been under 130 fasting and < 150 post prandially.  Denies any recent hypoglyemic events.  Taking   medications as directed. Following a carbohydrate modified diet 6 days per week. Denies numbness, burning and tingling of extremities. Appetite is good.    Lab Results  Component Value Date   HGBA1C 6.1 06/19/2022     Outpatient Medications Prior to Visit  Medication Sig Dispense Refill   anastrozole (ARIMIDEX) 1 MG tablet Take 1 tablet (1 mg total) by mouth daily. 90 tablet 1   Ascorbic Acid (VITAMIN C) 1000 MG tablet Take 1 tablet (1,000 mg total) by mouth daily.     aspirin 81 MG tablet Take 81 mg by mouth daily.     Blood Glucose Monitoring Suppl (TRUE METRIX AIR GLUCOSE METER) w/Device KIT Use to check blood sugars up to four times daily. 1 kit 0   cholecalciferol (VITAMIN D) 25 MCG (1000 UNIT) tablet Take 1,000 Units by mouth  daily.     cyclobenzaprine (FLEXERIL) 10 MG tablet TAKE 1 TABLET BY MOUTH 3 TIMES DAILY AS NEEDED FOR MUSCLE SPASM(S) 90 tablet 1   fluticasone (FLONASE) 50 MCG/ACT nasal spray SPRAY 2 SPRAYS INTO EACH NOSTRIL EVERY DAY 48 mL 0   nystatin (MYCOSTATIN/NYSTOP) powder Apply 1 application topically 2 (two) times daily. To rash until resolved. 45 g 3   OZEMPIC, 0.25 OR 0.5 MG/DOSE, 2 MG/3ML SOPN INJECT 0.5 MG INTO THE SKIN ONCE A WEEK. 3 mL 2   telmisartan-hydrochlorothiazide (MICARDIS HCT) 80-25 MG tablet TAKE 1 TABLET BY MOUTH IN  THE EVENING 90 tablet 0   triamcinolone cream (KENALOG) 0.1 % Apply topically 2 (two) times daily. For itchy rash 45 g 1   TRUEplus Lancets 33G MISC Use to check blood sugars up to four times daily. 400 each 3   TRUE METRIX BLOOD GLUCOSE TEST test strip CHECK BLOOD SUGAR  UP  TO FOUR TIMES DAILY 100 strip 1   No facility-administered medications prior to visit.    Review of Systems;  Patient denies headache, fevers, malaise, unintentional weight loss, skin rash, eye pain, sinus congestion and sinus pain, sore throat, dysphagia,  hemoptysis , cough, dyspnea, wheezing, chest pain, palpitations, orthopnea, edema, abdominal pain, nausea, melena, diarrhea, constipation, flank pain, dysuria, hematuria, urinary  Frequency, nocturia, numbness, tingling, seizures,  Focal weakness, Loss of consciousness,  Tremor, insomnia, depression, anxiety, and suicidal ideation.      Objective:  BP 122/68 (BP Location: Left Arm, Patient Position: Sitting, Cuff Size: Large)   Pulse 75   Temp 97.9 F (36.6 C) (Oral)   Ht _0  (1.575 m)   Wt 187 lb 6.4 oz (85 kg)   SpO2 97%   BMI 34.28 kg/m   BP Readings from Last 3 Encounters:  09/18/22 122/68  08/30/22 118/60  05/05/22 130/70    Wt Readings from Last 3 Encounters:  09/18/22 187 lb 6.4 oz (85 kg)  08/30/22 190 lb (86.2 kg)  05/05/22 191 lb (86.6 kg)    General appearance: alert, cooperative and appears stated age Ears:  normal TM's and external ear canals both ears Throat: lips, mucosa, and tongue normal; teeth and gums normal Neck: no adenopathy, no carotid bruit, supple, symmetrical, trachea midline and thyroid not enlarged, symmetric, no tenderness/mass/nodules Back: symmetric, no curvature. ROM normal. No CVA tenderness. Lungs: clear to auscultation bilaterally Heart: regular rate and rhythm, S1, S2 normal, no murmur, click, rub or gallop Abdomen: soft, non-tender; bowel sounds normal; no masses,  no organomegaly Pulses: 2+ and symmetric Skin: multiple macular patches of lichenified skin without pigment change or raised border  Lymph nodes: Cervical, supraclavicular, and axillary nodes normal. Neuro:  awake and interactive with normal mood and affect. Higher cortical functions are normal. Speech is clear without word-finding difficulty or dysarthria. Extraocular movements are intact. Visual fields of both eyes are grossly intact. Sensation to light touch is grossly intact bilaterally of upper and lower extremities. Motor examination shows 4+/5 symmetric hand grip and upper extremity and 5/5 lower extremity strength. There is no pronation or drift. Gait is non-ataxic   Lab Results  Component Value Date   HGBA1C 6.1 06/19/2022   HGBA1C 6.2 (H) 03/17/2022   HGBA1C 6.2 (A) 11/03/2021    Lab Results  Component Value Date   CREATININE 1.13 06/19/2022   CREATININE 1.07 04/05/2022   CREATININE 1.06 (H) 03/17/2022    Lab Results  Component Value Date   WBC 7.4 03/17/2022   HGB 11.4 (L) 03/17/2022   HCT 34.4 (L) 03/17/2022   PLT 365 03/17/2022   GLUCOSE 108 (H) 06/19/2022   CHOL 207 (H) 06/19/2022   TRIG 94.0 06/19/2022   HDL 64.50 06/19/2022   LDLDIRECT 71.0 09/14/2017   LDLCALC 123 (H) 06/19/2022   ALT 9 06/19/2022   AST 12 06/19/2022   NA 139 06/19/2022   K 4.1 06/19/2022   CL 102 06/19/2022   CREATININE 1.13 06/19/2022   BUN 22 06/19/2022   CO2 30 06/19/2022   TSH 1.20 11/02/2021    HGBA1C 6.1 06/19/2022   MICROALBUR <0.7 11/02/2021    MM DIAG BREAST TOMO BILATERAL  Result Date: 03/24/2022 CLINICAL DATA:  History of RIGHT breast cancer in 2018 status post lumpectomy and radiation therapy. EXAM: DIGITAL DIAGNOSTIC BILATERAL MAMMOGRAM WITH TOMOSYNTHESIS AND CAD TECHNIQUE: Bilateral digital diagnostic mammography and breast tomosynthesis was performed. The images were evaluated with computer-aided detection. COMPARISON:  Previous exam(s). ACR Breast Density Category b: There are scattered areas of fibroglandular density. FINDINGS: There are stable postsurgical changes within the RIGHT breast. There are no new dominant masses, suspicious calcifications or secondary signs of malignancy within either breast. IMPRESSION: No evidence of malignancy within either breast. Stable postsurgical changes within the RIGHT breast. RECOMMENDATION: 1.  Screening mammogram in one year.(Code:SM-B-01Y) 2. Per protocol, as the patient is now 2 or more  years status post lumpectomy, she may return to annual screening mammography in 1 year. However, given the history of breast cancer, the patient remains eligible for annual diagnostic mammography if preferred. I have discussed the findings and recommendations with the patient. If applicable, a reminder letter will be sent to the patient regarding the next appointment. BI-RADS CATEGORY  2: Benign. Electronically Signed   By: Franki Cabot M.D.   On: 03/24/2022 11:06    Assessment & Plan:   Problem List Items Addressed This Visit     Rash and nonspecific skin eruption    The skin condition appears to be due to xerosis and lichenification. Advised to use Eucerin daily to twice daily  And follow up with dermatology      Hyperlipidemia   Relevant Orders   Lipid panel   Direct LDL   Essential hypertension, benign - Primary   Relevant Orders   Comprehensive metabolic panel   Microalbumin / creatinine urine ratio   Controlled type 2 diabetes mellitus  with diabetic nephropathy, without long-term current use of insulin ([facility])     Glycemic control remains excellent  with Metformin XR and Ozempic  0. 5 mg weekly    Lab Results  Component Value Date   HGBA1C 6.1 06/19/2022   Lab Results  Component Value Date   MICROALBUR <0.7 11/02/2021         Relevant Orders   Hemoglobin A1c   Comprehensive metabolic panel   Microalbumin / creatinine urine ratio    I spent a total of  30  minutes with this patient in a face to face visit on the date of this encounter reviewing the last office visit with me several weeks ago, most recent visit with cardiology ,  patient's diet and exercise habits, home blood pressure /blood sugar readings,  and post visit ordering of testing and therapeutics.    Follow-up: Return in about 6 months (around 03/19/2023).   [name], MD

## 2022-09-20 ENCOUNTER — Other Ambulatory Visit (INDEPENDENT_AMBULATORY_CARE_PROVIDER_SITE_OTHER): Payer: Medicare HMO

## 2022-09-20 DIAGNOSIS — E7800 Pure hypercholesterolemia, unspecified: Secondary | ICD-10-CM | POA: Diagnosis not present

## 2022-09-20 DIAGNOSIS — E1121 Type 2 diabetes mellitus with diabetic nephropathy: Secondary | ICD-10-CM | POA: Diagnosis not present

## 2022-09-20 DIAGNOSIS — I10 Essential (primary) hypertension: Secondary | ICD-10-CM

## 2022-09-20 LAB — COMPREHENSIVE METABOLIC PANEL
ALT: 9 U/L (ref 0–35)
AST: 12 U/L (ref 0–37)
Albumin: 4 g/dL (ref 3.5–5.2)
Alkaline Phosphatase: 104 U/L (ref 39–117)
BUN: 25 mg/dL — ABNORMAL HIGH (ref 6–23)
CO2: 32 mEq/L (ref 19–32)
Calcium: 9.9 mg/dL (ref 8.4–10.5)
Chloride: 102 mEq/L (ref 96–112)
Creatinine, Ser: 1.16 mg/dL (ref 0.40–1.20)
GFR: 47.17 mL/min — ABNORMAL LOW (ref >60.00)
Glucose, Bld: 112 mg/dL — ABNORMAL HIGH (ref 70–99)
Potassium: 4.8 mEq/L (ref 3.5–5.1)
Sodium: 138 mEq/L (ref 135–145)
Total Bilirubin: 0.4 mg/dL (ref 0.2–1.2)
Total Protein: 6.7 g/dL (ref 6.0–8.3)

## 2022-09-20 LAB — LIPID PANEL
Cholesterol: 153 mg/dL (ref 0–200)
HDL: 56 mg/dL (ref >39.00)
LDL Cholesterol: 83 mg/dL (ref 0–99)
NonHDL: 96.57
Total CHOL/HDL Ratio: 3
Triglycerides: 69 mg/dL (ref 0.0–149.0)
VLDL: 13.8 mg/dL (ref 0.0–40.0)

## 2022-09-20 LAB — MICROALBUMIN / CREATININE URINE RATIO
Creatinine,U: 136.1 mg/dL
Microalb Creat Ratio: 1.1 mg/g (ref 0.0–30.0)
Microalb, Ur: 1.5 mg/dL (ref 0.0–1.9)

## 2022-09-20 LAB — LDL CHOLESTEROL, DIRECT: Direct LDL: 79 mg/dL

## 2022-09-20 LAB — HEMOGLOBIN A1C: Hgb A1c MFr Bld: 6.2 % (ref 4.6–6.5)

## 2022-09-21 ENCOUNTER — Telehealth: Payer: Self-pay | Admitting: Internal Medicine

## 2022-09-21 ENCOUNTER — Encounter: Payer: Self-pay | Admitting: Internal Medicine

## 2022-09-21 NOTE — Telephone Encounter (Signed)
Pt called stating she would like to ask the cma an important question

## 2022-09-21 NOTE — Telephone Encounter (Signed)
Pt is in the donut hole and unable to afford her Ozempic at this time. Per Dr. Derrel Nip the last time we gave her a sample Dr. Derrel Nip stated that it was okay to give her some until she gets out of the donut hole. Pt is aware that a sample has been set aside for her and that she is to continue the 0.5 mg dose.   Medication Samples have been provided to the patient.  Drug name: Ozempic       Strength: 0.5 mg         Qty: 1 box  LOT: [id number]  Exp.Date: 12/25/2023  Dosing instructions: Inject 0.5 mg into skin once weekly.   The patient has been instructed regarding the correct time, dose, and frequency of taking this medication, including desired effects and most common side effects.   [place] 5:02 PM 09/21/2022

## 2022-09-22 NOTE — Telephone Encounter (Signed)
Disregard patient had already spoke with [facility] & sample has been provided.

## 2022-09-22 NOTE — Telephone Encounter (Signed)
Pt picked up sample today at 12:07p.

## 2022-10-04 ENCOUNTER — Telehealth: Payer: Self-pay | Admitting: Internal Medicine

## 2022-10-04 MED ORDER — TELMISARTAN-HCTZ 80-25 MG PO TABS: 1.0000 | ORAL_TABLET | Freq: Every evening | ORAL | 0 refills | Status: DC

## 2022-10-04 NOTE — Telephone Encounter (Signed)
Patient called [facility] to inform them she was completely out of her telmisartan-hydrochlorothiazide (MICARDIS HCT) 80-25 MG tablet. She would like [facility] medication sent [facility]  [id number] [place] [address]:  [phone] Fax:  [phone]

## 2022-10-05 MED ORDER — TELMISARTAN-HCTZ 80-25 MG PO TABS: 1.0000 | ORAL_TABLET | Freq: Every evening | ORAL | 1 refills | Status: DC

## 2022-10-05 NOTE — Telephone Encounter (Signed)
Patient has not had her medication for 2 days . Has requested her hydrochlorothiazide to be called into [facility] [place].

## 2022-10-05 NOTE — Telephone Encounter (Signed)
Spoke with pt to let her know that [facility] [place].

## 2022-10-05 NOTE — Addendum Note (Signed)
Addended by: Adair Laundry on: 10/05/2022 01:31 PM   Modules accepted: Orders

## 2022-10-09 ENCOUNTER — Telehealth: Payer: Self-pay

## 2022-10-09 MED ORDER — CYCLOBENZAPRINE HCL 10 MG PO TABS: 10.0000 mg | ORAL_TABLET | Freq: Three times a day (TID) | ORAL | 1 refills | Status: DC | PRN

## 2022-10-09 NOTE — Telephone Encounter (Signed)
Medication has been sent to [facility].

## 2022-10-09 NOTE — Telephone Encounter (Signed)
Seth Bake called from [facility] regarding the prescription request for cyclobenzaprine (FLEXERIL) 10 MG tablet.  Seth Bake states she needs a new prescription request to be sent to [facility] for this medication.  Seth Bake states we can fax the prescription to [phone] or call it in to [phone].

## 2022-10-23 ENCOUNTER — Encounter (INDEPENDENT_AMBULATORY_CARE_PROVIDER_SITE_OTHER): Payer: Self-pay

## 2022-10-27 DIAGNOSIS — C50411 Malignant neoplasm of upper-outer quadrant of right female breast: Secondary | ICD-10-CM | POA: Diagnosis not present

## 2022-11-01 ENCOUNTER — Encounter: Payer: Self-pay | Admitting: Internal Medicine

## 2022-11-01 DIAGNOSIS — E1121 Type 2 diabetes mellitus with diabetic nephropathy: Secondary | ICD-10-CM

## 2022-11-02 ENCOUNTER — Telehealth: Payer: Self-pay

## 2022-11-02 NOTE — Telephone Encounter (Signed)
Pt picked up 1 box of Ozempic 0.25/0.'5mg'$ 

## 2022-11-06 ENCOUNTER — Other Ambulatory Visit: Payer: Self-pay | Admitting: Internal Medicine

## 2022-11-06 ENCOUNTER — Ambulatory Visit
Admission: RE | Admit: 2022-11-06 | Discharge: 2022-11-06 | Disposition: A | Discharge status: Home / Self Care | Payer: No Typology Code available for payment source | Source: Ambulatory Visit | Attending: Internal Medicine | Admitting: Internal Medicine

## 2022-11-06 ENCOUNTER — Ambulatory Visit (INDEPENDENT_AMBULATORY_CARE_PROVIDER_SITE_OTHER): Payer: Medicare HMO

## 2022-11-06 ENCOUNTER — Ambulatory Visit
Admission: RE | Admit: 2022-11-06 | Discharge: 2022-11-06 | Disposition: A | Discharge status: Home / Self Care | Payer: No Typology Code available for payment source | Attending: Internal Medicine | Admitting: Internal Medicine

## 2022-11-06 VITALS — Ht 62.0 in | Wt 187.0 lb

## 2022-11-06 DIAGNOSIS — M8580 Other specified disorders of bone density and structure, unspecified site: Secondary | ICD-10-CM | POA: Diagnosis not present

## 2022-11-06 DIAGNOSIS — Z0000 Encounter for general adult medical examination without abnormal findings: Secondary | ICD-10-CM | POA: Diagnosis not present

## 2022-11-06 DIAGNOSIS — S63501A Unspecified sprain of right wrist, initial encounter: Secondary | ICD-10-CM | POA: Diagnosis not present

## 2022-11-06 DIAGNOSIS — M25531 Pain in right wrist: Secondary | ICD-10-CM | POA: Insufficient documentation

## 2022-11-06 NOTE — Patient Instructions (Addendum)
Martha [name] , Thank you for taking time to come for your Medicare Wellness Visit. I appreciate your ongoing commitment to your health goals. Please review the following plan we discussed and let me know if I can assist you in the future.   These are the goals we discussed:  Goals       Patient Stated     Increase physical activity (pt-stated)      Use the treadmill  Begin water aerobics and silver sneaker program Weight loss goal 170lb      Other     Follow up with Primary Care Provider      As needed      Increase water intake        This is a list of the screening recommended for you and due dates:  Health Maintenance  Topic Date Due   Complete foot exam   11/02/2022   COVID-19 Vaccine (6 - Pfizer risk series) 11/22/2022*   Zoster (Shingles) Vaccine (1 of 2) 02/06/2023*   Hemoglobin A1C  03/21/2023   Eye exam for diabetics  08/12/2023   Cologuard (Stool DNA test)  08/26/2023   Yearly kidney function blood test for diabetes  09/21/2023   Yearly kidney health urinalysis for diabetes  09/21/2023   Medicare Annual Wellness Visit  11/07/2023   Mammogram  03/24/2024   Tetanus Vaccine  10/30/2029   Pneumonia Vaccine  Completed   Flu Shot  Completed   DEXA scan (bone density measurement)  Completed   HPV Vaccine  Aged Out   Colon Cancer Screening  Discontinued  *Topic was postponed. The date shown is not the original due date.    Advanced directives:   Conditions/risks identified: none new  Next appointment: Follow up in one year for your annual wellness visit    Preventive Care 65 Years and Older, Female Preventive care refers to lifestyle choices and visits with your health care provider that can promote health and wellness. What does preventive care include? A yearly physical exam. This is also called an annual well check. Dental exams once or twice a year. Routine eye exams. Ask your health care provider how often you should have your eyes checked. Personal  lifestyle choices, including: Daily care of your teeth and gums. Regular physical activity. Eating a healthy diet. Avoiding tobacco and drug use. Limiting alcohol use. Practicing safe sex. Taking low-dose aspirin every day. Taking vitamin and mineral supplements as recommended by your health care provider. What happens during an annual well check? The services and screenings done by your health care provider during your annual well check will depend on your age, overall health, lifestyle risk factors, and family history of disease. Counseling  Your health care provider may ask you questions about your: Alcohol use. Tobacco use. Drug use. Emotional well-being. Home and relationship well-being. Sexual activity. Eating habits. History of falls. Memory and ability to understand (cognition). Work and work Statistician. Reproductive health. Screening  You may have the following tests or measurements: Height, weight, and BMI. Blood pressure. Lipid and cholesterol levels. These may be checked every 5 years, or more frequently if you are over 60 years old. Skin check. Lung cancer screening. You may have this screening every year starting at age 57 if you have a 30-pack-year history of smoking and currently smoke or have quit within the past 15 years. Fecal occult blood test (FOBT) of the stool. You may have this test every year starting at age 58. Flexible sigmoidoscopy or colonoscopy. You  may have a sigmoidoscopy every 5 years or a colonoscopy every 10 years starting at age 61. Hepatitis C blood test. Hepatitis B blood test. Sexually transmitted disease (STD) testing. Diabetes screening. This is done by checking your blood sugar (glucose) after you have not eaten for a while (fasting). You may have this done every 1-3 years. Bone density scan. This is done to screen for osteoporosis. You may have this done starting at age 49. Mammogram. This may be done every 1-2 years. Talk to your  health care provider about how often you should have regular mammograms. Talk with your health care provider about your test results, treatment options, and if necessary, the need for more tests. Vaccines  Your health care provider may recommend certain vaccines, such as: Influenza vaccine. This is recommended every year. Tetanus, diphtheria, and acellular pertussis (Tdap, Td) vaccine. You may need a Td booster every 10 years. Zoster vaccine. You may need this after age 45. Pneumococcal 13-valent conjugate (PCV13) vaccine. One dose is recommended after age 23. Pneumococcal polysaccharide (PPSV23) vaccine. One dose is recommended after age 48. Talk to your health care provider about which screenings and vaccines you need and how often you need them. This information is not intended to replace advice given to you by your health care provider. Make sure you discuss any questions you have with your health care provider. Document Released: 01/07/2016 Document Revised: 08/30/2016 Document Reviewed: 10/12/2015 Elsevier Interactive Patient Education  2017 [place] Prevention in the Home Falls can cause injuries. They can happen to people of all ages. There are many things you can do to make your home safe and to help prevent falls. What can I do on the outside of my home? Regularly fix the edges of walkways and driveways and fix any cracks. Remove anything that might make you trip as you walk through a door, such as a raised step or threshold. Trim any bushes or trees on the path to your home. Use bright outdoor lighting. Clear any walking paths of anything that might make someone trip, such as rocks or tools. Regularly check to see if handrails are loose or broken. Make sure that both sides of any steps have handrails. Any raised decks and porches should have guardrails on the edges. Have any leaves, snow, or ice cleared regularly. Use sand or salt on walking paths during winter. Clean  up any spills in your garage right away. This includes oil or grease spills. What can I do in the bathroom? Use night lights. Install grab bars by the toilet and in the tub and shower. Do not use towel bars as grab bars. Use non-skid mats or decals in the tub or shower. If you need to sit down in the shower, use a plastic, non-slip stool. Keep the floor dry. Clean up any water that spills on the floor as soon as it happens. Remove soap buildup in the tub or shower regularly. Attach bath mats securely with double-sided non-slip rug tape. Do not have throw rugs and other things on the floor that can make you trip. What can I do in the bedroom? Use night lights. Make sure that you have a light by your bed that is easy to reach. Do not use any sheets or blankets that are too big for your bed. They should not hang down onto the floor. Have a firm chair that has side arms. You can use this for support while you get dressed. Do not have throw  rugs and other things on the floor that can make you trip. What can I do in the kitchen? Clean up any spills right away. Avoid walking on wet floors. Keep items that you use a lot in easy-to-reach places. If you need to reach something above you, use a strong step stool that has a grab bar. Keep electrical cords out of the way. Do not use floor polish or wax that makes floors slippery. If you must use wax, use non-skid floor wax. Do not have throw rugs and other things on the floor that can make you trip. What can I do with my stairs? Do not leave any items on the stairs. Make sure that there are handrails on both sides of the stairs and use them. Fix handrails that are broken or loose. Make sure that handrails are as long as the stairways. Check any carpeting to make sure that it is firmly attached to the stairs. Fix any carpet that is loose or worn. Avoid having throw rugs at the top or bottom of the stairs. If you do have throw rugs, attach them to the  floor with carpet tape. Make sure that you have a light switch at the top of the stairs and the bottom of the stairs. If you do not have them, ask someone to add them for you. What else can I do to help prevent falls? Wear shoes that: Do not have high heels. Have rubber bottoms. Are comfortable and fit you well. Are closed at the toe. Do not wear sandals. If you use a stepladder: Make sure that it is fully opened. Do not climb a closed stepladder. Make sure that both sides of the stepladder are locked into place. Ask someone to hold it for you, if possible. Clearly mark and make sure that you can see: Any grab bars or handrails. First and last steps. Where the edge of each step is. Use tools that help you move around (mobility aids) if they are needed. These include: Canes. Walkers. Scooters. Crutches. Turn on the lights when you go into a dark area. Replace any light bulbs as soon as they burn out. Set up your furniture so you have a clear path. Avoid moving your furniture around. If any of your floors are uneven, fix them. If there are any pets around you, be aware of where they are. Review your medicines with your doctor. Some medicines can make you feel dizzy. This can increase your chance of falling. Ask your doctor what other things that you can do to help prevent falls. This information is not intended to replace advice given to you by your health care provider. Make sure you discuss any questions you have with your health care provider. Document Released: 10/07/2009 Document Revised: 05/18/2016 Document Reviewed: 01/15/2015 Elsevier Interactive Patient Education  2017 [employer].

## 2022-11-06 NOTE — Progress Notes (Signed)
Subjective:   TAZIA ILLESCAS is a 71 y.o. female who presents for Medicare Annual (Subsequent) preventive examination.  Review of Systems    No ROS.  Medicare Wellness Virtual Visit.  Visual/audio telehealth visit, UTA vital signs.   See social history for additional risk factors.   Cardiac Risk Factors include: advanced age (>22mn, >>53women)     Objective:    Today's Vitals   11/06/22 1535  Weight: 187 lb (84.8 kg)  Height: _0  (1.575 m)   Body mass index is 34.2 kg/m.     11/06/2022    3:27 PM 11/02/2021   10:16 AM 02/21/2021    7:52 AM 11/01/2020    9:55 AM 10/30/2019    9:45 AM 10/03/2018   10:06 AM 02/06/2018    4:12 PM  Advanced Directives  Does Patient Have a Medical Advance Directive? No No No Yes Yes No No  Type of ATheatre stage managerof ANazarethLiving will HPowellLiving will    Does patient want to make changes to medical advance directive?    No - Patient declined No - Patient declined    Copy of [place]?    No - copy requested No - copy requested    Would patient like information on creating a medical advance directive? No - Patient declined No - Patient declined Yes (MAU/Ambulatory/Procedural Areas - Information given)   No - Patient declined No - Patient declined    Current Medications (verified) Outpatient Encounter Medications as of 11/06/2022  Medication Sig   anastrozole (ARIMIDEX) 1 MG tablet Take 1 tablet (1 mg total) by mouth daily.   Ascorbic Acid (VITAMIN C) 1000 MG tablet Take 1 tablet (1,000 mg total) by mouth daily.   aspirin 81 MG tablet Take 81 mg by mouth daily.   Blood Glucose Monitoring Suppl (TRUE METRIX AIR GLUCOSE METER) w/Device KIT Use to check blood sugars up to four times daily.   cholecalciferol (VITAMIN D) 25 MCG (1000 UNIT) tablet Take 1,000 Units by mouth daily.   cyclobenzaprine (FLEXERIL) 10 MG tablet Take 1 tablet (10 mg total) by mouth 3 (three) times  daily as needed for muscle spasms.   fluticasone (FLONASE) 50 MCG/ACT nasal spray SPRAY 2 SPRAYS INTO EACH NOSTRIL EVERY DAY   nystatin (MYCOSTATIN/NYSTOP) powder Apply 1 application topically 2 (two) times daily. To rash until resolved.   OZEMPIC, 0.25 OR 0.5 MG/DOSE, 2 MG/3ML SOPN INJECT 0.5 MG INTO THE SKIN ONCE A WEEK.   telmisartan-hydrochlorothiazide (MICARDIS HCT) 80-25 MG tablet Take 1 tablet by mouth every evening.   triamcinolone cream (KENALOG) 0.1 % Apply topically 2 (two) times daily. For itchy rash   TRUE METRIX BLOOD GLUCOSE TEST test strip CHECK BLOOD SUGAR  UP  TO FOUR TIMES DAILY   TRUEplus Lancets 33G MISC Use to check blood sugars up to four times daily.   No facility-administered encounter medications on file as of 11/06/2022.    Allergies (verified) Patient has no known allergies.   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   [place]   right breast cancer   Colon polyp 2008   Diabetes mellitus without complication ([place]    Endometrial hyperplasia 2013   Fatty liver    GERD (gastroesophageal reflux disease)    OCC-NO MEDS   Headache    H/O MIGRAINES   Hypertension  Papilloma 11/20/2016   right breast   Personal history of radiation therapy    2018   Status post dilation of esophageal narrowing 2008   Past Surgical History:  Procedure Laterality Date   BREAST BIOPSY Right 11/02/2016   ATYPICAL PAPILLARY LESION,   BREAST EXCISIONAL BIOPSY Right 11/20/2016   AHD   BREAST LUMPECTOMY Right 11/20/2016   Procedure: BREAST LUMPECTOMY;  Surgeon: [name], MD;  Location: [facility];  Service: General;  Laterality: Right;   BREAST LUMPECTOMY Right 04/2017   lumpectomy 2018, excision 2017   COLONOSCOPY  2008   in [place]     ESOPHAGOGASTRODUODENOSCOPY (EGD) WITH PROPOFOL N/A 02/21/2021   Procedure:  ESOPHAGOGASTRODUODENOSCOPY (EGD) WITH DILATION ;  Surgeon: [name], MD;  Location: [facility];  Service: Endoscopy;  Laterality: N/A;  Diabetic - oral meds   RADIOACTIVE SEED GUIDED PARTIAL MASTECTOMY WITH AXILLARY SENTINEL LYMPH NODE BIOPSY Right 05/16/2017   Procedure: RIGHT RADIOACTIVE SEED GUIDED LUMPECTOMY  WITH  RIGHT BREAST SEED GUIDED EXCISIONAL BIOPSY AND RIGHT AXILLARY SENTINEL  NODE BIOPSY;  Surgeon: [name], MD;  Location: [facility];  Service: General;  Laterality: Right;  2 SEEDS RIGHT BREAST  GENERAL AND PEC BLOCK ANESHTESIA   Family History  Problem Relation Age of Onset   Hypertension Mother    Heart disease Father 63   Bone cancer Brother    Hypertension Daughter    Cancer Maternal Aunt 68       breast and ovary   Breast cancer Maternal Aunt    Pancreatic cancer Maternal Aunt    Lymphoma Maternal Aunt    Social History   Socioeconomic History   Marital status: Married    Spouse name: Not on file   Number of children: Not on file   Years of education: Not on file   Highest education level: Not on file  Occupational History   Not on file  Tobacco Use   Smoking status: Former    Packs/day: 0.25    Years: 5.00    Total pack years: 1.25    Types: Cigarettes    Quit date: 07/24/1990    Years since quitting: 32.3   Smokeless tobacco: Never  Vaping Use   Vaping Use: Never used  Substance and Sexual Activity   Alcohol use: Yes    Comment: Socially   Drug use: No   Sexual activity: Not Currently  Other Topics Concern   Not on file  Social History Narrative   Lives in [place]. From [place]. Son lives with pt. Dog in home.      Work - [employer], and Theme park manager, now retired.      Diet - regular      Exercise - no regular   Social Determinants of Health   Financial Resource Strain: Low Risk  (11/06/2022)   Overall Financial Resource Strain (CARDIA)    Difficulty of Paying Living Expenses: Not hard at all  Food Insecurity:  No Food Insecurity (11/06/2022)   Hunger Vital Sign    Worried About Running Out of Food in the Last Year: Never true    Ran Out of Food in the Last Year: Never true  Transportation Needs: No Transportation Needs (11/06/2022)   PRAPARE - Hydrologist (Medical): No    Lack of Transportation (Non-Medical): No  Physical Activity: Insufficiently Active (11/06/2022)   Exercise Vital Sign    Days of Exercise per Week:  3 days    Minutes of Exercise per Session: 40 min  Stress: No Stress Concern Present (11/06/2022)   [place]    Feeling of Stress : Not at all  Social Connections: Unknown (11/06/2022)   Social Connection and Isolation Panel [NHANES]    Frequency of Communication with Friends and Family: More than three times a week    Frequency of Social Gatherings with Friends and Family: More than three times a week    Attends Religious Services: Not on Advertising copywriter or Organizations: Not on file    Attends Archivist Meetings: Not on file    Marital Status: Married    Tobacco Counseling Counseling given: Not Answered   Clinical Intake:  Pre-visit preparation completed: Yes        Diabetes: Yes (Followed by PCP)  How often do you need to have someone help you when you read instructions, pamphlets, or other written materials from your doctor or pharmacy?: 1 - Never   Interpreter Needed?: No      Activities of Daily Living    11/06/2022    3:29 PM  In your present state of health, do you have any difficulty performing the following activities:  Hearing? 0  Vision? 0  Difficulty concentrating or making decisions? 0  Walking or climbing stairs? 0  Dressing or bathing? 0  Doing errands, shopping? 0  Preparing Food and eating ? N  Using the Toilet? N  In the past six months, have you accidently leaked urine? N  Do you have problems with loss of  bowel control? N  Managing your Medications? N  Managing your Finances? N  Housekeeping or managing your Housekeeping? N    Patient Care Team: [name], MD as PCP - General (Internal Medicine) [name], MD (General Surgery) [name], MD as Consulting Physician (Hematology and Oncology) Delice Bison, Charlestine Massed, NP as Nurse Practitioner (Hematology and Oncology) Kyung Rudd, MD as Consulting Physician (Radiation Oncology) [name], MD as Consulting Physician (General Surgery)  Indicate any recent Medical Services you may have received from other than [facility] providers in the past year (date may be approximate).     Assessment:   This is a routine wellness examination for [place].  I connected with  CHARLEI RAMSARAN on 11/06/22 by a audio enabled telemedicine application and verified that I am speaking with the correct person using two identifiers.  Patient Location: Home  Provider Location: Office/Clinic  I discussed the limitations of evaluation and management by telemedicine. The patient expressed understanding and agreed to proceed.   Hearing/Vision screen Hearing Screening - Comments:: Patient is able to hear conversational tones without difficulty.  No issues reported.   Vision Screening - Comments:: Follow[facility] by [facility] Wears corrective lenses No retinopathy reported Cataract extraction, bilateral They have regular follow up with the ophthalmologist    Dietary issues and exercise activities discussed: Current Exercise Habits: Home exercise routine, Type of exercise: walking, Time (Minutes): 40, Frequency (Times/Week): 3, Weekly Exercise (Minutes/Week): 120, Intensity: Mild   Goals Addressed               This Visit's Progress     Patient Stated     Increase physical activity (pt-stated)        Use the treadmill  Begin water aerobics and silver sneaker program Weight loss goal 170lb  Depression Screen    11/06/2022    3:27 PM 09/18/2022   10:04 AM 08/30/2022    2:42 PM 05/05/2022    4:34 PM 03/17/2022    3:58 PM 11/02/2021    3:20 PM 11/02/2021    9:52 AM  PHQ 2/9 Scores  PHQ - 2 Score 0 0 0 0 0 0 0    Fall Risk    11/06/2022    3:29 PM 09/18/2022   10:04 AM 08/30/2022    2:42 PM 05/05/2022    4:34 PM 03/17/2022    3:58 PM  [place] in the past year? 0 0 0 0 0  Number falls in past yr: 0      Injury with Fall? 0      Risk for fall due to : No Fall Risks No Fall Risks No Fall Risks No Fall Risks   Follow up _0     FALL RISK PREVENTION PERTAINING TO THE HOME: Home free of loose throw rugs in walkways, pet beds, electrical cords, etc? Yes  Adequate lighting in your home to reduce risk of falls? Yes   ASSISTIVE DEVICES UTILIZED TO PREVENT FALLS: Life alert? No  Use of a cane, walker or w/c? No   TIMED UP AND GO: Was the test performed? No .   Cognitive Function:    10/03/2018   10:08 AM 10/02/2017    2:44 PM 09/20/2016    8:40 AM  MMSE - Mini Mental State Exam  Orientation to time _1 Orientation to Place _2 Registration _3 Attention/ Calculation _4 Recall _5 Language- name 2 objects _6 Language- repeat _7 Language- follow 3 step command _8 Language- read & follow direction _9 Write a sentence _10 Copy design _11 Total score _12 11/06/2022    3:32 PM 11/01/2020   10:01 AM 10/30/2019    9:49 AM  6CIT Screen  What Year? 0 points 0 points 0 points  What month? 0 points 0 points 0 points  What time? 0 points 0 points 0 points  Count back from 20 0 points 0 points 0 points  Months in reverse 0 points 0 points 0 points  Repeat phrase 0 points 0 points 0 points  Total Score 0 points 0 points 0 points    Immunizations Immunization History  Administered  Date(s) Administered   COVID-19, mRNA, vaccine(Comirnaty)12 years and older 09/30/2022   Fluad Quad(high Dose 65+) 09/11/2019, 10/06/2020, 09/19/2021, 09/12/2022   Hep A / Hep B 04/25/2021, 05/30/2021, 09/29/2021   Influenza Split 10/15/2014   Influenza, High Dose Seasonal PF 10/03/2018   Influenza,inj,Quad PF,6+ Mos 09/20/2016   Influenza-Unspecified 10/24/2012, 10/12/2013, 10/01/2017   PFIZER(Purple Top)SARS-COV-2 Vaccination 02/06/2020, 03/02/2020, 10/08/2020   PNEUMOCOCCAL CONJUGATE-20 11/02/2021   Pfizer Covid-19 Vaccine Bivalent Booster 107yr & up 09/19/2021, 05/17/2022   Pneumococcal Conjugate-13 06/21/2015, 08/25/2017   Pneumococcal Polysaccharide-23 10/24/2012   Tdap 07/24/2009, 10/10/2019, 10/31/2019   Screening Tests Health Maintenance  Topic Date Due   FOOT EXAM  11/02/2022   COVID-19 Vaccine (6 - Pfizer risk series) 11/22/2022 (Originally 07/12/2022)   Zoster Vaccines- Shingrix (1 of 2) 02/06/2023 (Originally 06/05/1970)   HEMOGLOBIN A1C  03/21/2023   OPHTHALMOLOGY  EXAM  08/12/2023   Fecal DNA (Cologuard)  08/26/2023   Diabetic kidney evaluation - GFR measurement  09/21/2023   Diabetic kidney evaluation - Urine ACR  09/21/2023   Medicare Annual Wellness (AWV)  11/07/2023   MAMMOGRAM  03/24/2024   TETANUS/TDAP  10/30/2029   Pneumonia Vaccine 33+ Years old  Completed   INFLUENZA VACCINE  Completed   DEXA SCAN  Completed   HPV VACCINES  Aged Out   COLONOSCOPY (Pts 45-45yr Insurance coverage will need to be confirmed)  Discontinued    Health Maintenance Health Maintenance Due  Topic Date Due   FOOT EXAM  11/02/2022   Lung Cancer Screening: (Low Dose CT Chest recommended if Age 72-80years, 30 pack-year currently smoking OR have quit w/in 15years.) does not qualify.   Vision Screening: Recommended annual ophthalmology exams for early detection of glaucoma and other disorders of the eye.  Dental Screening: Recommended annual dental exams for proper oral  hygiene.  Community Resource Referral / Chronic Care Management: CRR required this visit?  No   CCM required this visit?  No      Plan:     I have personally reviewed and noted the following in the patient's chart:   Medical and social history Use of alcohol, tobacco or illicit drugs  Current medications and supplements including opioid prescriptions. Patient is not currently taking opioid prescriptions. Functional ability and status Nutritional status Physical activity Advanced directives List of other physicians Hospitalizations, surgeries, and ER visits in previous 12 months Vitals Screenings to include cognitive, depression, and falls Referrals and appointments  In addition, I have reviewed and discussed with patient certain preventive protocols, quality metrics, and best practice recommendations. A written personalized care plan for preventive services as well as general preventive health recommendations were provided to patient.     DLeta Jungling LPN   [id number]/[id number]

## 2022-12-07 MED ORDER — TELMISARTAN-HCTZ 80-25 MG PO TABS: 1.0000 | ORAL_TABLET | Freq: Every evening | ORAL | 1 refills | Status: DC

## 2022-12-07 MED ORDER — OZEMPIC (0.25 OR 0.5 MG/DOSE) 2 MG/3ML ~~LOC~~ SOPN: 0.5000 mg | PEN_INJECTOR | SUBCUTANEOUS | 2 refills | Status: DC

## 2022-12-07 MED ORDER — TRUEPLUS LANCETS 33G MISC: 3 refills | Status: AC

## 2022-12-07 MED ORDER — CYCLOBENZAPRINE HCL 10 MG PO TABS: 10.0000 mg | ORAL_TABLET | Freq: Three times a day (TID) | ORAL | 1 refills | Status: AC | PRN

## 2022-12-07 MED ORDER — TRUE METRIX BLOOD GLUCOSE TEST VI STRP: ORAL_STRIP | 1 refills | Status: AC

## 2022-12-11 NOTE — Telephone Encounter (Signed)
LMTCB

## 2022-12-11 NOTE — Telephone Encounter (Signed)
Pt returning call

## 2022-12-12 NOTE — Telephone Encounter (Signed)
Medication Samples have been provided to the patient.  Drug name: Ozempic       Strength: 0.5 mg        Qty: 2 boxes  LOT: [id number]  Exp.Date: 06/23/2024  Dosing instructions: Inject 0.5 mg into skin once weekly.   The patient has been instructed regarding the correct time, dose, and frequency of taking this medication, including desired effects and most common side effects.   Hassen Bruun 9:13 AM 12/12/2022

## 2022-12-12 NOTE — Telephone Encounter (Signed)
Spoke with pt to let her know that we can give her a sample to get her through to the new year since she is in the donut hole. Also scheduled pt's mother for an appt tomorrow.

## 2022-12-13 ENCOUNTER — Ambulatory Visit: Payer: Medicare HMO | Admitting: Internal Medicine

## 2022-12-14 NOTE — Telephone Encounter (Signed)
Pt has picked up medication.  

## 2023-01-02 DIAGNOSIS — L2089 Other atopic dermatitis: Secondary | ICD-10-CM | POA: Diagnosis not present

## 2023-01-02 DIAGNOSIS — L821 Other seborrheic keratosis: Secondary | ICD-10-CM | POA: Diagnosis not present

## 2023-01-20 ENCOUNTER — Other Ambulatory Visit: Payer: Self-pay | Admitting: Family

## 2023-02-13 ENCOUNTER — Encounter: Payer: Self-pay | Admitting: Internal Medicine

## 2023-02-15 ENCOUNTER — Other Ambulatory Visit: Payer: Self-pay | Admitting: Internal Medicine

## 2023-02-23 ENCOUNTER — Other Ambulatory Visit: Payer: Self-pay | Admitting: Hematology and Oncology

## 2023-02-23 DIAGNOSIS — Z1231 Encounter for screening mammogram for malignant neoplasm of breast: Secondary | ICD-10-CM

## 2023-02-23 DIAGNOSIS — R928 Other abnormal and inconclusive findings on diagnostic imaging of breast: Secondary | ICD-10-CM

## 2023-02-26 NOTE — Telephone Encounter (Signed)
Is it okay to give pt another sample ot Ozempic?

## 2023-02-27 ENCOUNTER — Other Ambulatory Visit: Payer: Self-pay | Admitting: Hematology and Oncology

## 2023-02-27 DIAGNOSIS — Z853 Personal history of malignant neoplasm of breast: Secondary | ICD-10-CM

## 2023-02-27 DIAGNOSIS — N644 Mastodynia: Secondary | ICD-10-CM

## 2023-02-28 ENCOUNTER — Encounter: Payer: Self-pay | Admitting: Hematology and Oncology

## 2023-02-28 NOTE — Telephone Encounter (Signed)
Pt has picked up rx 

## 2023-03-06 NOTE — Progress Notes (Signed)
Patient Care Team: [name], MD as PCP - General (Internal Medicine) [name], MD (General Surgery) [name], MD as Consulting Physician (Hematology and Oncology) Delice Bison, Charlestine Massed, NP as Nurse Practitioner (Hematology and Oncology) Kyung Rudd, MD as Consulting Physician (Radiation Oncology) [name], MD as Consulting Physician (General Surgery)  DIAGNOSIS: No diagnosis found.  SUMMARY OF ONCOLOGIC HISTORY: Oncology History  Malignant neoplasm of upper-outer quadrant of right breast in female, estrogen receptor positive ([place])  04/09/2017 Mammogram   Right breast mass 9:00 position 1 cm from nipple: 4 mm; small asymmetry in the upper outer quadrant measuring 1 cm   04/20/2017 Initial Diagnosis   Right breast stereotactic biopsy: IDC with DCIS, biopsy 9:00 position: [facility], ER 100%, PR 100% HER-2 pending   05/16/2017 Surgery   Right lumpectomy: IDC grade 1, microscopic focus, low-grade DCIS, 0/3 lymph nodes negative, Atypical lobular hyperplasia, ER 100%, PR 100%, HER-2 negative ratio 1.63, Ki-67 insufficient, T16mic, N0 stage IA   06/18/2017 - 07/17/2017 Radiation Therapy   Adjuvant radiation therapy   09/2017 -  Anti-estrogen oral therapy   Anastrozole 1mg  daily     CHIEF COMPLIANT: Follow-up of right breast cancer on anastrozole therapy   INTERVAL HISTORY: Martha [name] is a 72 y.o. with above-mentioned history of right breast cancer treated with lumpectomy, adjuvant radiation, and who is currently on anastrozole therapy. She presents to the clinic for a follow-up.    ALLERGIES:  has No Known Allergies.  MEDICATIONS:  Current Outpatient Medications  Medication Sig Dispense Refill   Ascorbic Acid (VITAMIN C) 1000 MG tablet Take 1 tablet (1,000 mg total) by mouth daily.     aspirin 81 MG tablet Take 81 mg by mouth daily.     Blood Glucose Monitoring Suppl (TRUE METRIX AIR GLUCOSE METER) w/Device KIT Use to check blood sugars up to  four times daily. 1 kit 0   cholecalciferol (VITAMIN D) 25 MCG (1000 UNIT) tablet Take 1,000 Units by mouth daily.     cyclobenzaprine (FLEXERIL) 10 MG tablet Take 1 tablet (10 mg total) by mouth 3 (three) times daily as needed for muscle spasms. 90 tablet 1   fluticasone (FLONASE) 50 MCG/ACT nasal spray SPRAY 2 SPRAYS INTO EACH NOSTRIL EVERY DAY 48 mL 0   glucose blood (TRUE METRIX BLOOD GLUCOSE TEST) test strip CHECK BLOOD SUGAR  UP  TO FOUR TIMES DAILY 300 strip 1   nystatin (MYCOSTATIN/NYSTOP) powder Apply 1 application topically 2 (two) times daily. To rash until resolved. 45 g 3   OZEMPIC, 0.25 OR 0.5 MG/DOSE, 2 MG/3ML SOPN Inject 0.5 mg into the skin once a week. 3 mL 2   telmisartan-hydrochlorothiazide (MICARDIS HCT) 80-25 MG tablet Take 1 tablet by mouth every evening. 90 tablet 1   triamcinolone cream (KENALOG) 0.1 % Apply topically 2 (two) times daily. For itchy rash 45 g 1   TRUEplus Lancets 33G MISC Use to check blood sugars up to four times daily. 400 each 3   No current facility-administered medications for this visit.    PHYSICAL EXAMINATION: ECOG PERFORMANCE STATUS: {CHL ONC ECOG PS:[phone]}  There were no vitals filed for this visit. There were no vitals filed for this visit.  BREAST:*** No palpable masses or nodules in either right or left breasts. No palpable axillary supraclavicular or infraclavicular adenopathy no breast tenderness or nipple discharge. (exam performed in the presence of a chaperone)  LABORATORY DATA:  I have reviewed the data as listed    Latest Ref  Rng & Units 09/20/2022    9:50 AM 06/19/2022    9:00 AM 04/05/2022    9:35 AM  CMP  Glucose 70 - 99 mg/dL 112  108  95   BUN 6 - 23 mg/dL 25  22  21    Creatinine 0.40 - 1.20 mg/dL 1.16  1.13  1.07   Sodium 135 - 145 mEq/L 138  139  139   Potassium 3.5 - 5.1 mEq/L 4.8  4.1  4.4   Chloride 96 - 112 mEq/L 102  102  103   CO2 19 - 32 mEq/L 32  30  30   Calcium 8.4 - 10.5 mg/dL 9.9  9.8  9.8   Total  Protein 6.0 - 8.3 g/dL 6.7  6.5  6.6    6.7   Total Bilirubin 0.2 - 1.2 mg/dL 0.4  0.5  0.4   Alkaline Phos 39 - 117 U/L 104  98  98   AST 0 - 37 U/L 12  12  12    ALT 0 - 35 U/L 9  9  7      Lab Results  Component Value Date   WBC 7.4 03/17/2022   HGB 11.4 (L) 03/17/2022   HCT 34.4 (L) 03/17/2022   MCV 93.0 03/17/2022   PLT 365 03/17/2022   NEUTROABS 4,129 03/17/2022    ASSESSMENT & PLAN:  No problem-specific Assessment & Plan notes found for this encounter.    No orders of the defined types were placed in this encounter.  The patient has a good understanding of the overall plan. she agrees with it. she will call with any problems that may develop before the next visit here. Total time spent: 30 mins including face to face time and time spent for planning, charting and co-ordination of care   Suzzette Righter, [place] 03/06/23    I Gardiner Coins am acting as a Education administrator for [employer]  ***

## 2023-03-14 ENCOUNTER — Telehealth: Payer: Self-pay | Admitting: Adult Health

## 2023-03-14 NOTE — Telephone Encounter (Signed)
Rescheduled appointment per provider PAL. Patient is aware of the changes made to her upcoming appointment. 

## 2023-03-15 ENCOUNTER — Inpatient Hospital Stay: Payer: Medicare HMO | Attending: Hematology and Oncology | Admitting: Hematology and Oncology

## 2023-03-15 ENCOUNTER — Other Ambulatory Visit: Payer: Self-pay

## 2023-03-15 VITALS — BP 133/63 | HR 80 | Temp 97.6°F | Resp 18 | Ht 62.0 in | Wt 188.9 lb

## 2023-03-15 DIAGNOSIS — C50411 Malignant neoplasm of upper-outer quadrant of right female breast: Secondary | ICD-10-CM | POA: Diagnosis not present

## 2023-03-15 DIAGNOSIS — Z79811 Long term (current) use of aromatase inhibitors: Secondary | ICD-10-CM | POA: Insufficient documentation

## 2023-03-15 DIAGNOSIS — Z170 Estrogen receptor positive status [ER+]: Secondary | ICD-10-CM

## 2023-03-15 DIAGNOSIS — Z923 Personal history of irradiation: Secondary | ICD-10-CM | POA: Diagnosis not present

## 2023-03-15 DIAGNOSIS — Z79899 Other long term (current) drug therapy: Secondary | ICD-10-CM | POA: Diagnosis not present

## 2023-03-15 NOTE — Assessment & Plan Note (Signed)
Right breast mass 9:00 position 1 cm from nipple: 4 mm; small asymmetry in the upper outer quadrant measuring 1 cm 04/19/2017: Right breast stereotactic biopsy: IDC with DCIS, biopsy 9:00 position: [facility], ER 100%, PR 100% HER-2 Neg 05/21/17: Right lumpectomy: IDC grade 1, microscopic focus, low-grade DCIS, 0/3 lymph nodes negative, Atypical lobular hyperplasia, ER 100%, PR 100%, HER-2 negative ratio 1.63, Ki-67 insufficient, T37mic, N0 stage IA   Adj XRT 06/18/17- 07/17/17   Current Treatment: Anastrozole 1 mg daily will be completed October 2023 Anastrozole toxicities: 1.  Hot flashes: Mild to moderate 2. Myalgias: No longer an issue 3.  Decreased sleep   Breast cancer surveillance: 1.  Mammogram scheduled for 03/26/2023 2.  Breast exam 03/15/2023: Benign, scar tissue is the same as previous years.   Breast lymphedema:  Much better   RTC in 1 year with long-term survivorship clinic

## 2023-03-19 ENCOUNTER — Ambulatory Visit (INDEPENDENT_AMBULATORY_CARE_PROVIDER_SITE_OTHER): Payer: Medicare HMO | Admitting: Internal Medicine

## 2023-03-19 ENCOUNTER — Encounter: Payer: Self-pay | Admitting: Internal Medicine

## 2023-03-19 VITALS — BP 116/68 | HR 75 | Temp 97.4°F | Ht 62.0 in | Wt 187.2 lb

## 2023-03-19 DIAGNOSIS — C50411 Malignant neoplasm of upper-outer quadrant of right female breast: Secondary | ICD-10-CM | POA: Diagnosis not present

## 2023-03-19 DIAGNOSIS — D649 Anemia, unspecified: Secondary | ICD-10-CM

## 2023-03-19 DIAGNOSIS — Z170 Estrogen receptor positive status [ER+]: Secondary | ICD-10-CM

## 2023-03-19 DIAGNOSIS — E7800 Pure hypercholesterolemia, unspecified: Secondary | ICD-10-CM

## 2023-03-19 DIAGNOSIS — Z6836 Body mass index (BMI) 36.0-36.9, adult: Secondary | ICD-10-CM

## 2023-03-19 DIAGNOSIS — I10 Essential (primary) hypertension: Secondary | ICD-10-CM | POA: Diagnosis not present

## 2023-03-19 DIAGNOSIS — E1122 Type 2 diabetes mellitus with diabetic chronic kidney disease: Secondary | ICD-10-CM | POA: Diagnosis not present

## 2023-03-19 DIAGNOSIS — R5383 Other fatigue: Secondary | ICD-10-CM

## 2023-03-19 DIAGNOSIS — E1121 Type 2 diabetes mellitus with diabetic nephropathy: Secondary | ICD-10-CM | POA: Diagnosis not present

## 2023-03-19 DIAGNOSIS — N182 Chronic kidney disease, stage 2 (mild): Secondary | ICD-10-CM

## 2023-03-19 LAB — COMPREHENSIVE METABOLIC PANEL
ALT: 7 U/L (ref 0–35)
AST: 13 U/L (ref 0–37)
Albumin: 4.1 g/dL (ref 3.5–5.2)
Alkaline Phosphatase: 113 U/L (ref 39–117)
BUN: 25 mg/dL — ABNORMAL HIGH (ref 6–23)
CO2: 30 mEq/L (ref 19–32)
Calcium: 9.7 mg/dL (ref 8.4–10.5)
Chloride: 102 mEq/L (ref 96–112)
Creatinine, Ser: 1.15 mg/dL (ref 0.40–1.20)
GFR: 47.5 mL/min — ABNORMAL LOW (ref >60.00)
Glucose, Bld: 103 mg/dL — ABNORMAL HIGH (ref 70–99)
Potassium: 4.6 mEq/L (ref 3.5–5.1)
Sodium: 138 mEq/L (ref 135–145)
Total Bilirubin: 0.4 mg/dL (ref 0.2–1.2)
Total Protein: 6.8 g/dL (ref 6.0–8.3)

## 2023-03-19 LAB — CBC WITH DIFFERENTIAL/PLATELET
Basophils Absolute: 0.1 10*3/uL (ref 0.0–0.1)
Basophils Relative: 0.9 % (ref 0.0–3.0)
Eosinophils Absolute: 0.1 10*3/uL (ref 0.0–0.7)
Eosinophils Relative: 1.5 % (ref 0.0–5.0)
HCT: 37 % (ref 36.0–46.0)
Hemoglobin: 12.2 g/dL (ref 12.0–15.0)
Lymphocytes Relative: 31.8 % (ref 12.0–46.0)
Lymphs Abs: 1.9 10*3/uL (ref 0.7–4.0)
MCHC: 33.1 g/dL (ref 30.0–36.0)
MCV: 93.8 fl (ref 78.0–100.0)
Monocytes Absolute: 0.5 10*3/uL (ref 0.1–1.0)
Monocytes Relative: 8.1 % (ref 3.0–12.0)
Neutro Abs: 3.4 10*3/uL (ref 1.4–7.7)
Neutrophils Relative %: 57.7 % (ref 43.0–77.0)
Platelets: 380 10*3/uL (ref 150.0–400.0)
RBC: 3.94 Mil/uL (ref 3.87–5.11)
RDW: 14 % (ref 11.5–15.5)
WBC: 5.9 10*3/uL (ref 4.0–10.5)

## 2023-03-19 LAB — LIPID PANEL
Cholesterol: 179 mg/dL (ref 0–200)
HDL: 59.7 mg/dL (ref >39.00)
LDL Cholesterol: 107 mg/dL — ABNORMAL HIGH (ref 0–99)
NonHDL: 119.17
Total CHOL/HDL Ratio: 3
Triglycerides: 63 mg/dL (ref 0.0–149.0)
VLDL: 12.6 mg/dL (ref 0.0–40.0)

## 2023-03-19 LAB — LDL CHOLESTEROL, DIRECT: Direct LDL: 97 mg/dL

## 2023-03-19 LAB — HEMOGLOBIN A1C: Hgb A1c MFr Bld: 6.2 % (ref 4.6–6.5)

## 2023-03-19 MED ORDER — LOSARTAN POTASSIUM-HCTZ 100-25 MG PO TABS: 1.0000 | ORAL_TABLET | Freq: Every day | ORAL | 1 refills | Status: DC

## 2023-03-19 MED ORDER — ATORVASTATIN CALCIUM 20 MG PO TABS: 20.0000 mg | ORAL_TABLET | ORAL | 3 refills | Status: DC

## 2023-03-19 MED ORDER — SEMAGLUTIDE (1 MG/DOSE) 4 MG/3ML ~~LOC~~ SOPN: 1.0000 mg | PEN_INJECTOR | SUBCUTANEOUS | 2 refills | Status: DC

## 2023-03-19 NOTE — Patient Instructions (Addendum)
I HAVE Increased the Ozempic dose to 1 mg weekly and sent it to [place].   You might want to try using Relaxium for insomnia  (available on [place] ) . It contains:  Melatonin 5 mg  Chamomile 25 mg Passionflower extract 75 mg GABA 100 mg Ashwaganda extract 125 mg Magnesium citrate, glycinate, oxide (100 mg)  L tryptophan 500 mg Valerest (proprietary  ingredient ; probably valeria root extract)   Goal BP is 130/80 or less but not below 110/70    There are 4 categories of laxatives.  They can be combined,  But some should not be used daily .  Bulk  forming laxatives   Ok to use daily  Citrucel, benefiber, metamucil, Fibercon, )  Stool softener (docusate ; there's only one) :  ok to use daily   Stimulant laxatives (Ex Lax,  Correctol,  Senna,  Dulcolax)  :  avoid on a daily basis, not more than 2/week   Cathartic laxatives ( MOM,  Mag citrate, miralax , Lactulose ) : not more than 2 /week    Ok to take plain  magnesium in capsule form   With or without the stool softener daily
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
## 2023-03-20 ENCOUNTER — Telehealth: Payer: Self-pay | Admitting: Hematology and Oncology

## 2023-03-20 LAB — TSH: TSH: 1.1 u[IU]/mL (ref 0.35–5.50)

## 2023-03-20 NOTE — Telephone Encounter (Signed)
Scheduled appointment per 3/20 los. Patient is aware of the made appointment. ?

## 2023-03-20 NOTE — Assessment & Plan Note (Signed)
Right breast mass 9:00 position 1 cm from nipple: 4 mm; small asymmetry in the upper outer quadrant measuring 1 cm 04/19/2017: Right breast stereotactic biopsy: IDC with DCIS, biopsy 9:00 position: [facility], ER 100%, PR 100% HER-2 Neg 05/21/17: Right lumpectomy: IDC grade 1, microscopic focus, low-grade DCIS, 0/3 lymph nodes negative, Atypical lobular hyperplasia, ER 100%, PR 100%, HER-2 negative ratio 1.63, Ki-67 insufficient, T38mic, N0 stage IA.  Adj XRT 06/18/17- 07/17/17  She has completed 5 years of  Anastrozole 1 mg daily  as of  October 2023  I have reviewed her oncology notes .  Her  next  mammogram scheduled for 03/26/2023

## 2023-03-20 NOTE — Assessment & Plan Note (Signed)
Well controlled on current regimen. Renal function stable, no changes today. 

## 2023-03-20 NOTE — Assessment & Plan Note (Signed)
Her mother is now in an adult day program 3 days per week and fully enjoying the social contact,  but has becomes financially incompetent  and paranoid. which has added to [place]'s strain / counselling given

## 2023-03-20 NOTE — Assessment & Plan Note (Signed)
Currently well-controlled on current medications . Patient is reminded to schedule an annual eye exam and foot exam is normal today. Patient has no microalbuminuria. Patient is tolerating statin therapy for CAD risk reduction and on ACE/ARB for renal protection and hypertension  She is avoiding all NSAIDS orally,  But has been advised to increase water intake and continue ozempic at 1 mg dose.   Lab Results  Component Value Date   MICROALBUR 1.5 09/20/2022   MICROALBUR <0.7 11/02/2021      Lab Results  Component Value Date   HGBA1C 6.2 03/19/2023

## 2023-03-20 NOTE — Assessment & Plan Note (Signed)
No significant weight loss yet.  Increase ozempic to 1 mg weekly .  Encouraged to start exercising

## 2023-03-21 ENCOUNTER — Encounter: Payer: Self-pay | Admitting: Internal Medicine

## 2023-03-26 ENCOUNTER — Ambulatory Visit: Admission: RE | Admit: 2023-03-26 | Payer: Medicare HMO | Source: Ambulatory Visit

## 2023-03-26 ENCOUNTER — Ambulatory Visit
Admission: RE | Admit: 2023-03-26 | Discharge: 2023-03-26 | Disposition: A | Discharge status: Home / Self Care | Payer: Medicare HMO | Source: Ambulatory Visit | Attending: Hematology and Oncology | Admitting: Hematology and Oncology

## 2023-03-26 DIAGNOSIS — Z853 Personal history of malignant neoplasm of breast: Secondary | ICD-10-CM

## 2023-03-26 DIAGNOSIS — N644 Mastodynia: Secondary | ICD-10-CM

## 2023-03-26 DIAGNOSIS — R928 Other abnormal and inconclusive findings on diagnostic imaging of breast: Secondary | ICD-10-CM | POA: Diagnosis not present

## 2023-03-29 ENCOUNTER — Encounter: Payer: Medicare HMO | Admitting: Adult Health

## 2023-04-05 ENCOUNTER — Encounter: Payer: Medicare HMO | Admitting: Adult Health

## 2023-04-16 ENCOUNTER — Encounter: Payer: Self-pay | Admitting: Internal Medicine

## 2023-04-17 NOTE — Telephone Encounter (Signed)
Pt would like to know who you would recommend for a colonoscopy?

## 2023-06-04 DIAGNOSIS — C50411 Malignant neoplasm of upper-outer quadrant of right female breast: Secondary | ICD-10-CM | POA: Diagnosis not present

## 2023-06-11 ENCOUNTER — Other Ambulatory Visit: Payer: Self-pay

## 2023-06-12 NOTE — Progress Notes (Deleted)
Celso Amy, PA-C [address]  [address]  [place], [place] [address]  Main: [phone]  Fax: [phone]   Primary Care Physician: Sherlene Shams, MD  Primary Gastroenterologist:  Dr. Midge Minium   No chief complaint on file.   HPI: Martha [name] is a 72 y.o. female  Patient last saw Dr. Servando Snare 01/2021 in our office to follow-up with elevated alkaline phosphatase, and fatty liver disease.  RUQ ultrasound showed gallstones and hepatic steatosis.  Subcentimeter right hepatic cyst.  No evidence of cholecystitis.  Liver transaminases were normal.  She was given Twinrix hepatitis A and B vaccines.  Had a negative Cologuard test 08/2020 (Repeat in 3 years).  History of GERD and dysphagia.  Was on Nexium in the past.  History of breast cancer in 2018.  Colonoscopy in [place] in 2008.  Most recent lab 03/19/2023 showed normal alkaline phosphatase 113, AST 13, ALT 7, total bilirubin 0.4.  Normal hemoglobin 12.2, platelets 380, normal CBC.  Normal TSH.  Gastric emptying study 03/2021 was normal.  EGD 01/2021 by Dr. Servando Snare - Medium hiatal hernia, distal esophageal stricture at GE GE junction dilated to 18 mm with balloon dilator.  Large amount of food in the stomach.  Normal duodenum.  No biopsies.   Current Outpatient Medications  Medication Sig Dispense Refill   Ascorbic Acid (VITAMIN C) 1000 MG tablet Take 1 tablet (1,000 mg total) by mouth daily.     aspirin 81 MG tablet Take 81 mg by mouth daily.     atorvastatin (LIPITOR) 20 MG tablet Take 1 tablet (20 mg total) by mouth every other day. 45 tablet 3   Blood Glucose Monitoring Suppl (TRUE METRIX AIR GLUCOSE METER) w/Device KIT Use to check blood sugars up to four times daily. 1 kit 0   cholecalciferol (VITAMIN D) 25 MCG (1000 UNIT) tablet Take 1,000 Units by mouth daily.     cyclobenzaprine (FLEXERIL) 10 MG tablet Take 1 tablet (10 mg total) by mouth 3 (three) times daily as needed for muscle spasms. 90 tablet 1   fluticasone  (FLONASE) 50 MCG/ACT nasal spray SPRAY 2 SPRAYS INTO EACH NOSTRIL EVERY DAY 48 mL 0   glucose blood (TRUE METRIX BLOOD GLUCOSE TEST) test strip CHECK BLOOD SUGAR  UP  TO FOUR TIMES DAILY 300 strip 1   losartan-hydrochlorothiazide (HYZAAR) 100-25 MG tablet Take 1 tablet by mouth daily. 90 tablet 1   Semaglutide, 1 MG/DOSE, 4 MG/3ML SOPN Inject 1 mg into the skin once a week. 3 mL 2   telmisartan-hydrochlorothiazide (MICARDIS HCT) 80-25 MG tablet Take by mouth.     triamcinolone cream (KENALOG) 0.1 % Apply topically 2 (two) times daily. For itchy rash 45 g 1   TRUEplus Lancets 33G MISC Use to check blood sugars up to four times daily. 400 each 3   No current facility-administered medications for this visit.    Allergies as of 06/13/2023   (No Known Allergies)    Past Medical History:  Diagnosis Date   Arthritis    Atypical ductal hyperplasia of right breast 11/20/2016   Cancer (HCC) 04/2017   right breast cancer   Colon polyp 2008   Diabetes mellitus without complication [facility])    Endometrial hyperplasia 2013   Fatty liver    GERD (gastroesophageal reflux disease)    OCC-NO MEDS   Headache    H/O MIGRAINES   Hypertension    Papilloma 11/20/2016   right breast   Personal history of radiation  therapy    2018   Status post dilation of esophageal narrowing 2008    Past Surgical History:  Procedure Laterality Date   BREAST BIOPSY Right 11/02/2016   ATYPICAL PAPILLARY LESION,   BREAST EXCISIONAL BIOPSY Right 11/20/2016   AHD   BREAST LUMPECTOMY Right 11/20/2016   Procedure: BREAST LUMPECTOMY;  Surgeon: Kieth Brightly, MD;  Location: [facility] ORS;  Service: General;  Laterality: Right;   BREAST LUMPECTOMY Right 04/2017   lumpectomy 2018, excision 2017   COLONOSCOPY  2008   in [place]   DILATION AND CURETTAGE OF UTERUS     ESOPHAGOGASTRODUODENOSCOPY (EGD) WITH PROPOFOL N/A 02/21/2021   Procedure: ESOPHAGOGASTRODUODENOSCOPY (EGD) WITH DILATION ;  Surgeon: Midge Minium, MD;   Location: [facility];  Service: Endoscopy;  Laterality: N/A;  Diabetic - oral meds   RADIOACTIVE SEED GUIDED PARTIAL MASTECTOMY WITH AXILLARY SENTINEL LYMPH NODE BIOPSY Right 05/16/2017   Procedure: RIGHT RADIOACTIVE SEED GUIDED LUMPECTOMY  WITH  RIGHT BREAST SEED GUIDED EXCISIONAL BIOPSY AND RIGHT AXILLARY SENTINEL  NODE BIOPSY;  Surgeon: Emelia Loron, MD;  Location: [facility];  Service: General;  Laterality: Right;  2 SEEDS RIGHT BREAST  GENERAL AND PEC BLOCK ANESHTESIA    Review of Systems:    All systems reviewed and negative except where noted in HPI.   Physical Examination:   There were no vitals taken for this visit.  General: Well-nourished, well-developed in no acute distress.  Eyes: No icterus. Conjunctivae pink. Mouth: Oropharyngeal mucosa moist and pink , no lesions erythema or exudate. Lungs: Clear to auscultation bilaterally. Non-labored. Heart: Regular rate and rhythm, no murmurs rubs or gallops.  Abdomen: Bowel sounds are normal; Abdomen is Soft; No hepatosplenomegaly, masses or hernias;  No Abdominal Tenderness; No guarding or rebound tenderness. Extremities: No lower extremity edema. No clubbing or deformities. Neuro: Alert and oriented x 3.  Grossly intact. Skin: Warm and dry, no jaundice.   Psych: Alert and cooperative, normal mood and affect.   Imaging Studies: No results found.  Assessment and Plan:   Martha [name] is a 72 y.o. y/o female   1.  Fatty liver disease - Recent hepatic panel labs normal  Recommend a low-fat diet, regular exercise, and weight loss. Patient education handout about fatty liver disease was given.  2.  Cholelithiasis - Asymptomatic  Notify us if she develops RUQ pain, nausea, vomiting  Surgical referral if it becomes asymptomatic  Continue low-fat diet  3.  Colon cancer screening - Negative Cologuard 08/2020.   Repeat Cologuard every 3 years. Patient declined colonoscopy  4.  Medium hiatal hernia  / GERD  Continue treatment for GERD  5.  History of esophageal stricture dilated with EGD 01/2021    Celso Amy, PA-C  Follow up in ***  BP check ***

## 2023-06-13 ENCOUNTER — Ambulatory Visit: Payer: Medicare HMO | Admitting: Physician Assistant

## 2023-06-13 ENCOUNTER — Encounter: Payer: Self-pay | Admitting: Physician Assistant

## 2023-06-14 DIAGNOSIS — C50411 Malignant neoplasm of upper-outer quadrant of right female breast: Secondary | ICD-10-CM | POA: Diagnosis not present

## 2023-06-20 ENCOUNTER — Ambulatory Visit: Payer: Medicare HMO | Admitting: Internal Medicine

## 2023-06-27 ENCOUNTER — Encounter: Payer: Self-pay | Admitting: Internal Medicine

## 2023-06-27 DIAGNOSIS — I10 Essential (primary) hypertension: Secondary | ICD-10-CM

## 2023-06-27 DIAGNOSIS — E1121 Type 2 diabetes mellitus with diabetic nephropathy: Secondary | ICD-10-CM

## 2023-06-27 DIAGNOSIS — E7800 Pure hypercholesterolemia, unspecified: Secondary | ICD-10-CM

## 2023-07-11 ENCOUNTER — Other Ambulatory Visit: Payer: Self-pay | Admitting: Internal Medicine

## 2023-07-30 ENCOUNTER — Other Ambulatory Visit (INDEPENDENT_AMBULATORY_CARE_PROVIDER_SITE_OTHER): Payer: Medicare HMO

## 2023-07-30 DIAGNOSIS — I10 Essential (primary) hypertension: Secondary | ICD-10-CM | POA: Diagnosis not present

## 2023-07-30 DIAGNOSIS — E1121 Type 2 diabetes mellitus with diabetic nephropathy: Secondary | ICD-10-CM | POA: Diagnosis not present

## 2023-07-30 DIAGNOSIS — E7800 Pure hypercholesterolemia, unspecified: Secondary | ICD-10-CM

## 2023-07-30 LAB — LDL CHOLESTEROL, DIRECT: Direct LDL: 69 mg/dL

## 2023-07-30 LAB — LIPID PANEL
Cholesterol: 141 mg/dL (ref 0–200)
HDL: 55.5 mg/dL (ref >39.00)
LDL Cholesterol: 73 mg/dL (ref 0–99)
NonHDL: 85.35
Total CHOL/HDL Ratio: 3
Triglycerides: 62 mg/dL (ref 0.0–149.0)
VLDL: 12.4 mg/dL (ref 0.0–40.0)

## 2023-07-30 LAB — MICROALBUMIN / CREATININE URINE RATIO
Creatinine,U: 118.6 mg/dL
Microalb Creat Ratio: 1.2 mg/g (ref 0.0–30.0)
Microalb, Ur: 1.4 mg/dL (ref 0.0–1.9)

## 2023-07-30 LAB — HEMOGLOBIN A1C: Hgb A1c MFr Bld: 6 % (ref 4.6–6.5)

## 2023-08-16 DIAGNOSIS — Z961 Presence of intraocular lens: Secondary | ICD-10-CM | POA: Diagnosis not present

## 2023-08-16 DIAGNOSIS — H43813 Vitreous degeneration, bilateral: Secondary | ICD-10-CM | POA: Diagnosis not present

## 2023-08-16 DIAGNOSIS — H26492 Other secondary cataract, left eye: Secondary | ICD-10-CM | POA: Diagnosis not present

## 2023-08-16 DIAGNOSIS — E119 Type 2 diabetes mellitus without complications: Secondary | ICD-10-CM | POA: Diagnosis not present

## 2023-08-16 LAB — HM DIABETES EYE EXAM

## 2023-08-20 DIAGNOSIS — M545 Low back pain, unspecified: Secondary | ICD-10-CM | POA: Diagnosis not present

## 2023-09-02 ENCOUNTER — Encounter: Payer: Self-pay | Admitting: Internal Medicine

## 2023-09-18 ENCOUNTER — Other Ambulatory Visit: Payer: Self-pay | Admitting: Internal Medicine

## 2023-09-25 ENCOUNTER — Encounter: Payer: Self-pay | Admitting: Internal Medicine

## 2023-09-25 ENCOUNTER — Ambulatory Visit (INDEPENDENT_AMBULATORY_CARE_PROVIDER_SITE_OTHER): Payer: Medicare HMO | Admitting: Internal Medicine

## 2023-09-25 VITALS — BP 116/60 | HR 90 | Temp 98.2°F | Ht 62.0 in | Wt 184.6 lb

## 2023-09-25 DIAGNOSIS — I10 Essential (primary) hypertension: Secondary | ICD-10-CM

## 2023-09-25 DIAGNOSIS — M7661 Achilles tendinitis, right leg: Secondary | ICD-10-CM | POA: Diagnosis not present

## 2023-09-25 DIAGNOSIS — Z7985 Long-term (current) use of injectable non-insulin antidiabetic drugs: Secondary | ICD-10-CM

## 2023-09-25 DIAGNOSIS — E1121 Type 2 diabetes mellitus with diabetic nephropathy: Secondary | ICD-10-CM | POA: Diagnosis not present

## 2023-09-25 DIAGNOSIS — E1122 Type 2 diabetes mellitus with diabetic chronic kidney disease: Secondary | ICD-10-CM | POA: Diagnosis not present

## 2023-09-25 DIAGNOSIS — N183 Chronic kidney disease, stage 3 unspecified: Secondary | ICD-10-CM

## 2023-09-25 DIAGNOSIS — E7800 Pure hypercholesterolemia, unspecified: Secondary | ICD-10-CM | POA: Diagnosis not present

## 2023-09-25 DIAGNOSIS — Z6836 Body mass index (BMI) 36.0-36.9, adult: Secondary | ICD-10-CM | POA: Diagnosis not present

## 2023-09-25 DIAGNOSIS — E66812 Obesity, class 2: Secondary | ICD-10-CM

## 2023-09-25 DIAGNOSIS — Z1211 Encounter for screening for malignant neoplasm of colon: Secondary | ICD-10-CM

## 2023-09-25 MED ORDER — OZEMPIC (2 MG/DOSE) 8 MG/3ML ~~LOC~~ SOPN: 2.0000 mg | PEN_INJECTOR | SUBCUTANEOUS | 1 refills | Status: DC

## 2023-09-25 NOTE — Patient Instructions (Signed)
Referral to [facility] for achilles tendonopathy is in progress    You can add Voltaren gel if needed,  and continue to use the Tylenol Lidocaine cream  I  Increased the dose of Ozempic to 2 mg weekly    Return for labs in February

## 2023-09-25 NOTE — Assessment & Plan Note (Signed)
Currently well-controlled on current medications . Patient is reminded to schedule an annual eye exam and foot exam is normal today. Patient has no microalbuminuria. Patient is tolerating statin therapy for CAD risk reduction and on ACE/ARB for renal protection and hypertension  She is avoiding all NSAIDS orally,  But has been advised to increase water intake and continue ozempic at  2 mg dose.   Lab Results  Component Value Date   MICROALBUR 1.4 07/30/2023   MICROALBUR 1.5 09/20/2022      Lab Results  Component Value Date   HGBA1C 6.0 07/30/2023

## 2023-09-25 NOTE — Assessment & Plan Note (Signed)
No significant weight loss yet.  Increase ozempic to 2 mg weekly .  Encouraged to  increase  exercising

## 2023-09-25 NOTE — Assessment & Plan Note (Signed)
Continue ozempic at incresed dose of  2 mg

## 2023-09-25 NOTE — Progress Notes (Signed)
Subjective:  Patient ID: Martha [name], female    DOB: [birth year]  Age: 72 y.o. MRN: [id number]  CC: The primary encounter diagnosis was Colon cancer screening. Diagnoses of Essential hypertension, benign, Controlled type 2 diabetes mellitus with diabetic nephropathy, without long-term current use of insulin (HCC), Pure hypercholesterolemia, Insertional tendinopathy of right Achilles tendon, Class 2 severe obesity due to excess calories with serious comorbidity and body mass index (BMI) of 36.0 to 36.9 in adult [facility]), and CKD stage 3 due to type 2 diabetes mellitus (HCC) were also pertinent to this visit.   HPI Martha [name] presents for  Chief Complaint  Patient presents with   Medical Management of Chronic Issues    Discuss Ozempic   1) Type 2 DM/obesity.HTN:   She  feels generally well,  is  exercising  regularly AND trying to lose weight. Checking  blood sugars l aily and fasting readings re 104 to 109  and < 150 post prandially.  Denies any recent hypoglyemic events.  Taking OZEMPIC 1 MG WEEKLY AND WANTS TO INCREASE HER DOSE. She is following a carbohydrate modified diet 6 days per week. Denies numbness, burning and tingling of extremities. Appetite is good.  She is tolerating atorvastatin  and losartan and her home  BP readings have been < 130/80   2)  ACHILLES TENDON PAIN LEFT ANKLE .  Started one week ago. Improved with rest   so far so good.  WORKS AT [employer] and wears sketchers to work but standing on cement floor  and  by end of day  her knees and hips and ankles are aching.   D[facility]ed [facility]    Outpatient Medications Prior to Visit  Medication Sig Dispense Refill   Ascorbic Acid (VITAMIN C) 1000 MG tablet Take 1 tablet (1,000 mg total) by mouth daily.     aspirin 81 MG tablet Take 81 mg by mouth daily.     atorvastatin (LIPITOR) 20 MG tablet Take 1 tablet (20 mg total) by mouth every other day. 45 tablet 3   Blood Glucose Monitoring Suppl (TRUE METRIX AIR  GLUCOSE METER) w/Device KIT Use to check blood sugars up to four times daily. 1 kit 0   cholecalciferol (VITAMIN D) 25 MCG (1000 UNIT) tablet Take 1,000 Units by mouth daily.     cyclobenzaprine (FLEXERIL) 10 MG tablet Take 1 tablet (10 mg total) by mouth 3 (three) times daily as needed for muscle spasms. 90 tablet 1   fluticasone (FLONASE) 50 MCG/ACT nasal spray SPRAY 2 SPRAYS INTO EACH NOSTRIL EVERY DAY 48 mL 0   glucose blood (TRUE METRIX BLOOD GLUCOSE TEST) test strip CHECK BLOOD SUGAR  UP  TO FOUR TIMES DAILY 300 strip 1   losartan-hydrochlorothiazide (HYZAAR) 100-25 MG tablet TAKE 1 TABLET EVERY DAY 90 tablet 3   telmisartan-hydrochlorothiazide (MICARDIS HCT) 80-25 MG tablet Take by mouth.     triamcinolone cream (KENALOG) 0.1 % Apply topically 2 (two) times daily. For itchy rash 45 g 1   TRUEplus Lancets 33G MISC Use to check blood sugars up to four times daily. 400 each 3   Semaglutide, 1 MG/DOSE, (OZEMPIC, 1 MG/DOSE,) 4 MG/3ML SOPN INJECT 1 MG INTO THE SKIN ONCE A WEEK. 9 mL 3   No facility-administered medications prior to visit.    Review of Systems;  Patient denies headache, fevers, malaise, unintentional weight loss, skin rash, eye pain, sinus congestion and sinus pain, sore throat, dysphagia,  hemoptysis , cough, dyspnea, wheezing, chest  pain, palpitations, orthopnea, edema, abdominal pain, nausea, melena, diarrhea, constipation, flank pain, dysuria, hematuria, urinary  Frequency, nocturia, numbness, tingling, seizures,  Focal weakness, Loss of consciousness,  Tremor, insomnia, depression, anxiety, and suicidal ideation.      Objective:  BP 116/60   Pulse 90   Temp 98.2 F (36.8 C) (Oral)   Ht 5\' 2"  (1.575 m)   Wt 184 lb 9.6 oz (83.7 kg)   SpO2 99%   BMI 33.76 kg/m   BP Readings from Last 3 Encounters:  09/25/23 116/60  03/19/23 116/68  03/15/23 133/63    Wt Readings from Last 3 Encounters:  09/25/23 184 lb 9.6 oz (83.7 kg)  03/19/23 187 lb 3.2 oz (84.9 kg)   03/15/23 188 lb 14.4 oz (85.7 kg)    Physical Exam Vitals reviewed.  Constitutional:      General: She is not in acute distress.    Appearance: Normal appearance. She is normal weight. She is not ill-appearing, toxic-appearing or diaphoretic.  HENT:     Head: Normocephalic.  Eyes:     General: No scleral icterus.       Right eye: No discharge.        Left eye: No discharge.     Conjunctiva/sclera: Conjunctivae normal.  Cardiovascular:     Rate and Rhythm: Normal rate and regular rhythm.     Heart sounds: Normal heart sounds.  Pulmonary:     Effort: Pulmonary effort is normal. No respiratory distress.     Breath sounds: Normal breath sounds.  Musculoskeletal:        General: Normal range of motion.  Skin:    General: Skin is warm and dry.  Neurological:     General: No focal deficit present.     Mental Status: She is alert and oriented to person, place, and time. Mental status is at baseline.  Psychiatric:        Mood and Affect: Mood normal.        Behavior: Behavior normal.        Thought Content: Thought content normal.        Judgment: Judgment normal.    Lab Results  Component Value Date   HGBA1C 6.0 07/30/2023   HGBA1C 6.2 03/19/2023   HGBA1C 6.2 09/20/2022    Lab Results  Component Value Date   CREATININE 1.15 03/19/2023   CREATININE 1.16 09/20/2022   CREATININE 1.13 06/19/2022    Lab Results  Component Value Date   WBC 5.9 03/19/2023   HGB 12.2 03/19/2023   HCT 37.0 03/19/2023   PLT 380.0 03/19/2023   GLUCOSE 103 (H) 03/19/2023   CHOL 141 07/30/2023   TRIG 62.0 07/30/2023   HDL 55.50 07/30/2023   LDLDIRECT 69.0 07/30/2023   LDLCALC 73 07/30/2023   ALT 7 03/19/2023   AST 13 03/19/2023   NA 138 03/19/2023   K 4.6 03/19/2023   CL 102 03/19/2023   CREATININE 1.15 03/19/2023   BUN 25 (H) 03/19/2023   CO2 30 03/19/2023   TSH 1.10 03/19/2023   HGBA1C 6.0 07/30/2023   MICROALBUR 1.4 07/30/2023    MM 3D DIAGNOSTIC MAMMOGRAM BILATERAL  BREAST  Result Date: 03/26/2023 CLINICAL DATA:  72 year old female with diffuse RIGHT breast pain. History of RIGHT breast cancer and lumpectomy in 2018. EXAM: DIGITAL DIAGNOSTIC BILATERAL MAMMOGRAM WITH TOMOSYNTHESIS TECHNIQUE: Bilateral digital diagnostic mammography and breast tomosynthesis was performed. COMPARISON:  Previous exam(s). ACR Breast Density Category a: The breasts are almost entirely fatty. FINDINGS: Full field views of both  breasts demonstrate surgical changes within the UPPER RIGHT breast and RIGHT axilla. No suspicious mass, nonsurgical distortion or worrisome calcifications are identified. IMPRESSION: No evidence of breast malignancy. RECOMMENDATION: Consider clinical follow-up as indicated. Any further workup should be based on clinical grounds. Bilateral screening mammogram in 1 year. I have discussed the findings and recommendations with the patient. If applicable, a reminder letter will be sent to the patient regarding the next appointment. BI-RADS CATEGORY  2: Benign. Electronically Signed   By: Harmon Pier M.D.   On: 03/26/2023 13:38   Assessment & Plan:  .Colon cancer screening -     Cologuard  Essential hypertension, benign -     Comprehensive metabolic panel; Future  Controlled type 2 diabetes mellitus with diabetic nephropathy, without long-term current use of insulin (HCC) Assessment & Plan: Continue ozempic at incresed dose of  2 mg   Orders: -     Comprehensive metabolic panel; Future -     Hemoglobin A1c; Future  Pure hypercholesterolemia -     Lipid panel; Future -     LDL cholesterol, direct; Future  Insertional tendinopathy of right Achilles tendon -     Ambulatory referral to Podiatry  Class 2 severe obesity due to excess calories with serious comorbidity and body mass index (BMI) of 36.0 to 36.9 in adult [facility]) Assessment & Plan: No significant weight loss yet.  Increase ozempic to 2 mg weekly .  Encouraged to  increase  exercising    CKD stage 3  due to type 2 diabetes mellitus (HCC) Assessment & Plan: Currently well-controlled on current medications . Patient is reminded to schedule an annual eye exam and foot exam is normal today. Patient has no microalbuminuria. Patient is tolerating statin therapy for CAD risk reduction and on ACE/ARB for renal protection and hypertension  She is avoiding all NSAIDS orally,  But has been advised to increase water intake and continue ozempic at  2 mg dose.   Lab Results  Component Value Date   MICROALBUR 1.4 07/30/2023   MICROALBUR 1.5 09/20/2022      Lab Results  Component Value Date   HGBA1C 6.0 07/30/2023      Other orders -     Ozempic (2 MG/DOSE); Inject 2 mg into the skin once a week.  Dispense: 9 mL; Refill: 1     I provided 30 minutes of face-to-face time during this encounter reviewing patient's last visit with me, patient's  most recent visit with cardiology,  nephrology,  and neurology,  recent surgical and non surgical procedures, previous  labs and imaging studies, counseling on currently addressed issues,  and post visit ordering to diagnostics and therapeutics .   Follow-up: Return in about 6 months (around 03/25/2024) for follow up diabetes.   Sherlene Shams, MD

## 2023-10-04 DIAGNOSIS — Z1211 Encounter for screening for malignant neoplasm of colon: Secondary | ICD-10-CM | POA: Diagnosis not present

## 2023-10-05 ENCOUNTER — Telehealth: Payer: Self-pay

## 2023-10-05 NOTE — Telephone Encounter (Signed)
LMTCB. Need to let pt know that we have a patient assistance application here at the office tht she will need to come by and sign so we can fax back to [employer].

## 2023-10-05 NOTE — Telephone Encounter (Signed)
noted 

## 2023-10-05 NOTE — Telephone Encounter (Signed)
Patient just returned call. I read her the message. She said she will be here today after 3 to sign the form.

## 2023-10-09 ENCOUNTER — Ambulatory Visit: Payer: Medicare HMO | Admitting: Podiatry

## 2023-10-09 DIAGNOSIS — L603 Nail dystrophy: Secondary | ICD-10-CM

## 2023-10-09 DIAGNOSIS — M7662 Achilles tendinitis, left leg: Secondary | ICD-10-CM

## 2023-10-09 NOTE — Progress Notes (Signed)
Subjective:  Patient ID: Martha [name], female    DOB: [birth year],  MRN: [id number]  No chief complaint on file.   72 y.o. female presents with the above complaint.  Patient presents with left Achilles tendinitis painful to touch is progressive gotten worse worse with ambulation worse with pressure she states that has been going for quite some time.  She stands a lot at [employer].  She is a diabetic.  She also has complaint of nail fungus to the right side.  She wanted discuss treatment options for that.  She does not want to take anything orally or do laser.   Review of Systems: Negative except as noted in the HPI. Denies N/V/F/Ch.  Past Medical History:  Diagnosis Date   Arthritis    Atypical ductal hyperplasia of right breast 11/20/2016   Cancer (HCC) 04/2017   right breast cancer   Colon polyp 2008   Diabetes mellitus without complication [facility])    Endometrial hyperplasia 2013   Fatty liver    GERD (gastroesophageal reflux disease)    OCC-NO MEDS   Headache    H/O MIGRAINES   Hypertension    Papilloma 11/20/2016   right breast   Personal history of radiation therapy    2018   Status post dilation of esophageal narrowing 2008    Current Outpatient Medications:    Ascorbic Acid (VITAMIN C) 1000 MG tablet, Take 1 tablet (1,000 mg total) by mouth daily., Disp: , Rfl:    aspirin 81 MG tablet, Take 81 mg by mouth daily., Disp: , Rfl:    atorvastatin (LIPITOR) 20 MG tablet, Take 1 tablet (20 mg total) by mouth every other day., Disp: 45 tablet, Rfl: 3   Blood Glucose Monitoring Suppl (TRUE METRIX AIR GLUCOSE METER) w/Device KIT, Use to check blood sugars up to four times daily., Disp: 1 kit, Rfl: 0   cholecalciferol (VITAMIN D) 25 MCG (1000 UNIT) tablet, Take 1,000 Units by mouth daily., Disp: , Rfl:    cyclobenzaprine (FLEXERIL) 10 MG tablet, Take 1 tablet (10 mg total) by mouth 3 (three) times daily as needed for muscle spasms., Disp: 90 tablet, Rfl: 1   fluticasone  (FLONASE) 50 MCG/ACT nasal spray, SPRAY 2 SPRAYS INTO EACH NOSTRIL EVERY DAY, Disp: 48 mL, Rfl: 0   glucose blood (TRUE METRIX BLOOD GLUCOSE TEST) test strip, CHECK BLOOD SUGAR  UP  TO FOUR TIMES DAILY, Disp: 300 strip, Rfl: 1   losartan-hydrochlorothiazide (HYZAAR) 100-25 MG tablet, TAKE 1 TABLET EVERY DAY, Disp: 90 tablet, Rfl: 3   Semaglutide, 2 MG/DOSE, (OZEMPIC, 2 MG/DOSE,) 8 MG/3ML SOPN, Inject 2 mg into the skin once a week., Disp: 9 mL, Rfl: 1   telmisartan-hydrochlorothiazide (MICARDIS HCT) 80-25 MG tablet, Take by mouth., Disp: , Rfl:    triamcinolone cream (KENALOG) 0.1 %, Apply topically 2 (two) times daily. For itchy rash, Disp: 45 g, Rfl: 1   TRUEplus Lancets 33G MISC, Use to check blood sugars up to four times daily., Disp: 400 each, Rfl: 3  Social History   Tobacco Use  Smoking Status Former   Current packs/day: 0.00   Average packs/day: 0.3 packs/day for 5.0 years (1.3 ttl pk-yrs)   Types: Cigarettes   Start date: 07/24/1985   Quit date: 07/24/1990   Years since quitting: 33.2  Smokeless Tobacco Never    No Known Allergies Objective:  There were no vitals filed for this visit. There is no height or weight on file to calculate BMI. Constitutional Well developed.  Well nourished.  Vascular Dorsalis pedis pulses palpable bilaterally. Posterior tibial pulses palpable bilaterally. Capillary refill normal to all digits.  No cyanosis or clubbing noted. Pedal hair growth normal.  Neurologic Normal speech. Oriented to person, place, and time. Epicritic sensation to light touch grossly present bilaterally.  Dermatologic Right hallux nail dystrophy with thickening onychodystrophy mycotic toenails x 1 No open wounds. No skin lesions.  Orthopedic: Pain on palpation to the left Achilles tendon insertion no positive Haglund's deformity noted positive Silfverskiold test noted with gastrocnemius equinus.  Pain with dorsiflexion of the ankle no pain with plantarflexion of the ankle    Radiographs: None Assessment:   1. Left Achilles tendinitis   2. Nail dystrophy    Plan:  Patient was evaluated and treated and all questions answered.  Left Achilles tendinitis -I explained the patient the etiology of tendinitis and worse treatment options were discussed given the amount of pain that she is having she will benefit from cam boot immobilization to allow the soft tissue structure to heal appropriately.  She states understanding would like to proceed with cam boot immobilization Cam boot was dispensed.  Right hallux nail dystrophy -Educated the patient on the etiology of onychomycosis and various treatment options associated with improving the fungal load.  I explained to the patient that there is 3 treatment options available to treat the onychomycosis including topical, p.o., laser treatment.  Patient elected undergo topical medication with Penlac.  Penlac was sent to the pharmacy of asked her to apply twice a day for 6 to 8 months she states that her son will do so   No follow-ups on file.

## 2023-10-10 LAB — COLOGUARD: COLOGUARD: NEGATIVE

## 2023-10-24 ENCOUNTER — Telehealth: Payer: Self-pay

## 2023-10-24 NOTE — Telephone Encounter (Signed)
Received pt's patient assistance medication in the office today and pt is here to pick it up.   Ozempic 2 mg: 3 boxes

## 2023-10-30 ENCOUNTER — Telehealth: Payer: Self-pay | Admitting: Internal Medicine

## 2023-10-30 NOTE — Telephone Encounter (Signed)
Copied from CRM [id number]. Topic: Medicare AWV >> Oct 30, 2023 11:11 AM Payton Doughty wrote: Reason for CRM: Called LVM 10/30/2023 to schedule Annual Wellness Visit  Verlee Rossetti; Care Guide Ambulatory Clinical Support [place] [facility] Direct Dial: [phone]

## 2023-11-06 ENCOUNTER — Encounter: Payer: Self-pay | Admitting: Podiatry

## 2023-11-06 ENCOUNTER — Ambulatory Visit: Payer: Medicare HMO | Admitting: Podiatry

## 2023-11-06 VITALS — Ht 62.0 in | Wt 184.6 lb

## 2023-11-06 DIAGNOSIS — M7662 Achilles tendinitis, left leg: Secondary | ICD-10-CM

## 2023-11-06 DIAGNOSIS — M62462 Contracture of muscle, left lower leg: Secondary | ICD-10-CM

## 2023-11-06 DIAGNOSIS — L603 Nail dystrophy: Secondary | ICD-10-CM | POA: Diagnosis not present

## 2023-11-06 NOTE — Progress Notes (Signed)
Subjective:  Patient ID: Martha [name], female    DOB: 13-Apr-1951,  MRN: [id number]  Chief Complaint  Patient presents with   Foot Pain    F/u on left foot pain, pt states foot feels a lot better some pain sometimes after being on her feet for a while.    72 y.o. female presents with the above complaint.  Patient presents with left Achilles tendinitis.  She states she is doing a lot better cam boot immobilization helped.  She would like to discuss next treatment plan.  She is very come out of the boot   Review of Systems: Negative except as noted in the HPI. Denies N/V/F/Ch.  Past Medical History:  Diagnosis Date   Arthritis    Atypical ductal hyperplasia of right breast 11/20/2016   Cancer (HCC) 04/2017   right breast cancer   Colon polyp 2008   Diabetes mellitus without complication [facility])    Endometrial hyperplasia 2013   Fatty liver    GERD (gastroesophageal reflux disease)    OCC-NO MEDS   Headache    H/O MIGRAINES   Hypertension    Papilloma 11/20/2016   right breast   Personal history of radiation therapy    2018   Status post dilation of esophageal narrowing 2008    Current Outpatient Medications:    Ascorbic Acid (VITAMIN C) 1000 MG tablet, Take 1 tablet (1,000 mg total) by mouth daily., Disp: , Rfl:    aspirin 81 MG tablet, Take 81 mg by mouth daily., Disp: , Rfl:    atorvastatin (LIPITOR) 20 MG tablet, Take 1 tablet (20 mg total) by mouth every other day., Disp: 45 tablet, Rfl: 3   Blood Glucose Monitoring Suppl (TRUE METRIX AIR GLUCOSE METER) w/Device KIT, Use to check blood sugars up to four times daily., Disp: 1 kit, Rfl: 0   cholecalciferol (VITAMIN D) 25 MCG (1000 UNIT) tablet, Take 1,000 Units by mouth daily., Disp: , Rfl:    cyclobenzaprine (FLEXERIL) 10 MG tablet, Take 1 tablet (10 mg total) by mouth 3 (three) times daily as needed for muscle spasms., Disp: 90 tablet, Rfl: 1   fluticasone (FLONASE) 50 MCG/ACT nasal spray, SPRAY 2 SPRAYS INTO EACH  NOSTRIL EVERY DAY, Disp: 48 mL, Rfl: 0   glucose blood (TRUE METRIX BLOOD GLUCOSE TEST) test strip, CHECK BLOOD SUGAR  UP  TO FOUR TIMES DAILY, Disp: 300 strip, Rfl: 1   losartan-hydrochlorothiazide (HYZAAR) 100-25 MG tablet, TAKE 1 TABLET EVERY DAY, Disp: 90 tablet, Rfl: 3   Semaglutide, 2 MG/DOSE, (OZEMPIC, 2 MG/DOSE,) 8 MG/3ML SOPN, Inject 2 mg into the skin once a week., Disp: 9 mL, Rfl: 1   triamcinolone cream (KENALOG) 0.1 %, Apply topically 2 (two) times daily. For itchy rash, Disp: 45 g, Rfl: 1   TRUEplus Lancets 33G MISC, Use to check blood sugars up to four times daily., Disp: 400 each, Rfl: 3   telmisartan-hydrochlorothiazide (MICARDIS HCT) 80-25 MG tablet, Take by mouth., Disp: , Rfl:   Social History   Tobacco Use  Smoking Status Former   Current packs/day: 0.00   Average packs/day: 0.3 packs/day for 5.0 years (1.3 ttl pk-yrs)   Types: Cigarettes   Start date: 07/24/1985   Quit date: 07/24/1990   Years since quitting: 33.3  Smokeless Tobacco Never    No Known Allergies Objective:  There were no vitals filed for this visit. Body mass index is 33.76 kg/m. Constitutional Well developed. Well nourished.  Vascular Dorsalis pedis pulses palpable bilaterally. Posterior tibial  pulses palpable bilaterally. Capillary refill normal to all digits.  No cyanosis or clubbing noted. Pedal hair growth normal.  Neurologic Normal speech. Oriented to person, place, and time. Epicritic sensation to light touch grossly present bilaterally.  Dermatologic Right hallux nail dystrophy with thickening onychodystrophy mycotic toenails x 1 No open wounds. No skin lesions.  Orthopedic: Pain on palpation to the left Achilles tendon insertion no positive Haglund's deformity noted positive Silfverskiold test noted with gastrocnemius equinus.  Pain with dorsiflexion of the ankle no pain with plantarflexion of the ankle   Radiographs: None Assessment:   1. Left Achilles tendinitis   2.  Gastrocnemius equinus, left     Plan:  Patient was evaluated and treated and all questions answered.  Left Achilles tendinitis with underlying pain gastrocnemius equinus -Clinically her pain improved considerably.  She still has some residual pain therefore I believe she would benefit from a steroid injection of decrease inflammatory component surgical pain.  Patient agrees with plan like to proceed with steroid injection. -A steroid injection was performed at left ankle Kager's fat pad using 1% plain Lidocaine and 10 mg of Kenalog. This was well tolerated. -Sugar modification discussed -0 continue stretching   Right hallux nail dystrophy -Educated the patient on the etiology of onychomycosis and various treatment options associated with improving the fungal load.  I explained to the patient that there is 3 treatment options available to treat the onychomycosis including topical, p.o., laser treatment.  Patient elected undergo topical medication with Penlac.  Penlac was sent to the pharmacy of asked her to apply twice a day for 6 to 8 months she states that her son will do so   No follow-ups on file.

## 2023-11-21 ENCOUNTER — Telehealth: Payer: Self-pay | Admitting: Internal Medicine

## 2023-11-21 NOTE — Telephone Encounter (Signed)
Left message to call the office to reschedule her 02/26/24 and 03/25/24 appointment.

## 2024-01-03 DIAGNOSIS — H11131 Conjunctival pigmentations, right eye: Secondary | ICD-10-CM | POA: Diagnosis not present

## 2024-01-21 ENCOUNTER — Ambulatory Visit (INDEPENDENT_AMBULATORY_CARE_PROVIDER_SITE_OTHER): Payer: Medicare HMO | Admitting: *Deleted

## 2024-01-21 VITALS — Ht 62.0 in | Wt 182.0 lb

## 2024-01-21 DIAGNOSIS — Z0000 Encounter for general adult medical examination without abnormal findings: Secondary | ICD-10-CM | POA: Diagnosis not present

## 2024-01-21 NOTE — Progress Notes (Signed)
Subjective:   Martha [name] is a 73 y.o. female who presents for Medicare Annual (Subsequent) preventive examination.  Visit Complete: Virtual I connected with  Martha [name] on 01/21/24 by a audio enabled telemedicine application and verified that I am speaking with the correct person using two identifiers. [name] ,This patient declined Interactive audio and video telecommunications. Therefore the visit was completed with audio only.     Patient Location: Other:  at work  Provider Location: Office/Clinic  I discussed the limitations of evaluation and management by telemedicine. The patient expressed understanding and agreed to proceed.  Vital Signs: Because this visit was a virtual/telehealth visit, some criteria may be missing or patient reported. Any vitals not documented were not able to be obtained and vitals that have been documented are patient reported.  Cardiac Risk Factors include: advanced age (>13men, >64 women);diabetes mellitus;dyslipidemia;hypertension;obesity (BMI >30kg/m2)     Objective:    Today's Vitals   01/21/24 1526  Weight: 182 lb (82.6 kg)  Height: 5\' 2"  (1.575 m)   Body mass index is 33.29 kg/m.     01/21/2024    3:37 PM 11/06/2022    3:27 PM 11/02/2021   10:16 AM 02/21/2021    7:52 AM 11/01/2020    9:55 AM 10/30/2019    9:45 AM 10/03/2018   10:06 AM  Advanced Directives  Does Patient Have a Medical Advance Directive? No No No No Yes Yes No  Type of Agricultural consultant;Living will Healthcare Power of Aten;Living will   Does patient want to make changes to medical advance directive?     No - Patient declined No - Patient declined   Copy of Healthcare Power of Attorney in Chart?     No - copy requested No - copy requested   Would patient like information on creating a medical advance directive? No - Patient declined No - Patient declined No - Patient declined Yes (MAU/Ambulatory/Procedural Areas - Information  given)   No - Patient declined    Current Medications (verified) Outpatient Encounter Medications as of 01/21/2024  Medication Sig   Ascorbic Acid (VITAMIN C) 1000 MG tablet Take 1 tablet (1,000 mg total) by mouth daily. (Patient not taking: Reported on 01/21/2024)   aspirin 81 MG tablet Take 81 mg by mouth daily.   atorvastatin (LIPITOR) 20 MG tablet Take 1 tablet (20 mg total) by mouth every other day.   Blood Glucose Monitoring Suppl (TRUE METRIX AIR GLUCOSE METER) w/Device KIT Use to check blood sugars up to four times daily.   cholecalciferol (VITAMIN D) 25 MCG (1000 UNIT) tablet Take 1,000 Units by mouth daily.   cyclobenzaprine (FLEXERIL) 10 MG tablet Take 1 tablet (10 mg total) by mouth 3 (three) times daily as needed for muscle spasms.   fluticasone (FLONASE) 50 MCG/ACT nasal spray SPRAY 2 SPRAYS INTO EACH NOSTRIL EVERY DAY   glucose blood (TRUE METRIX BLOOD GLUCOSE TEST) test strip CHECK BLOOD SUGAR  UP  TO FOUR TIMES DAILY   losartan-hydrochlorothiazide (HYZAAR) 100-25 MG tablet TAKE 1 TABLET EVERY DAY   Semaglutide, 2 MG/DOSE, (OZEMPIC, 2 MG/DOSE,) 8 MG/3ML SOPN Inject 2 mg into the skin once a week.   triamcinolone cream (KENALOG) 0.1 % Apply topically 2 (two) times daily. For itchy rash   TRUEplus Lancets 33G MISC Use to check blood sugars up to four times daily.   [DISCONTINUED] telmisartan-hydrochlorothiazide (MICARDIS HCT) 80-25 MG tablet Take by mouth. (Patient not taking: Reported  on 01/21/2024)   No facility-administered encounter medications on file as of 01/21/2024.    Allergies (verified) Patient has no known allergies.   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   Cancer (HCC) 04/2017   right breast cancer   Colon polyp 2008   Diabetes mellitus without complication (HCC)    Endometrial hyperplasia 2013   Fatty liver    GERD (gastroesophageal reflux disease)    OCC-NO MEDS   Headache     H/O MIGRAINES   Hypertension    Papilloma 11/20/2016   right breast   Personal history of radiation therapy    2018   Status post dilation of esophageal narrowing 2008   Past Surgical History:  Procedure Laterality Date   BREAST BIOPSY Right 11/02/2016   ATYPICAL PAPILLARY LESION,   BREAST EXCISIONAL BIOPSY Right 11/20/2016   AHD   BREAST LUMPECTOMY Right 11/20/2016   Procedure: BREAST LUMPECTOMY;  Surgeon: Kieth Brightly, MD;  Location: [facility] ORS;  Service: General;  Laterality: Right;   BREAST LUMPECTOMY Right 04/2017   lumpectomy 2018, excision 2017   COLONOSCOPY  2008   in [place]   DILATION AND CURETTAGE OF UTERUS     ESOPHAGOGASTRODUODENOSCOPY (EGD) WITH PROPOFOL N/A 02/21/2021   Procedure: ESOPHAGOGASTRODUODENOSCOPY (EGD) WITH DILATION ;  Surgeon: Midge Minium, MD;  Location: [facility];  Service: Endoscopy;  Laterality: N/A;  Diabetic - oral meds   RADIOACTIVE SEED GUIDED PARTIAL MASTECTOMY WITH AXILLARY SENTINEL LYMPH NODE BIOPSY Right 05/16/2017   Procedure: RIGHT RADIOACTIVE SEED GUIDED LUMPECTOMY  WITH  RIGHT BREAST SEED GUIDED EXCISIONAL BIOPSY AND RIGHT AXILLARY SENTINEL  NODE BIOPSY;  Surgeon: Emelia Loron, MD;  Location:  SURGERY CENTER;  Service: General;  Laterality: Right;  2 SEEDS RIGHT BREAST  GENERAL AND PEC BLOCK ANESHTESIA   Family History  Problem Relation Age of Onset   Hypertension Mother    Heart disease Father 103   Bone cancer Brother    Hypertension Daughter    Cancer Maternal Aunt 9       breast and ovary   Breast cancer Maternal Aunt    Pancreatic cancer Maternal Aunt    Lymphoma Maternal Aunt    Social History   Socioeconomic History   Marital status: Married    Spouse name: Not on file   Number of children: Not on file   Years of education: Not on file   Highest education level: Not on file  Occupational History   Not on file  Tobacco Use   Smoking status: Former    Current packs/day: 0.00     Average packs/day: 0.3 packs/day for 5.0 years (1.3 ttl pk-yrs)    Types: Cigarettes    Start date: 07/24/1985    Quit date: 07/24/1990    Years since quitting: 33.5   Smokeless tobacco: Never  Vaping Use   Vaping status: Never Used  Substance and Sexual Activity   Alcohol use: Yes    Comment: Socially   Drug use: No   Sexual activity: Not Currently  Other Topics Concern   Not on file  Social History Narrative   Lives in [place]. From [place]. Son lives with pt. Dog in home.      Work - [employer], and Interior and spatial designer, now retired.      Diet - regular      Exercise - no regular   Social Drivers of Corporate investment banker  Strain: Low Risk  (01/21/2024)   Overall Financial Resource Strain (CARDIA)    Difficulty of Paying Living Expenses: Not hard at all  Food Insecurity: No Food Insecurity (01/21/2024)   Hunger Vital Sign    Worried About Running Out of Food in the Last Year: Never true    Ran Out of Food in the Last Year: Never true  Transportation Needs: No Transportation Needs (01/21/2024)   PRAPARE - Administrator, Civil Service (Medical): No    Lack of Transportation (Non-Medical): No  Physical Activity: Inactive (01/21/2024)   Exercise Vital Sign    Days of Exercise per Week: 0 days    Minutes of Exercise per Session: 0 min  Stress: No Stress Concern Present (01/21/2024)   [employer] of Occupational Health - Occupational Stress Questionnaire    Feeling of Stress : Not at all  Social Connections: Socially Integrated (01/21/2024)   Social Connection and Isolation Panel [NHANES]    Frequency of Communication with Friends and Family: More than three times a week    Frequency of Social Gatherings with Friends and Family: More than three times a week    Attends Religious Services: More than 4 times per year    Active Member of [employer] or Organizations: Yes    Attends Engineer, structural: More than 4 times per year    Marital Status: Married    Tobacco  Counseling Counseling given: Not Answered   Clinical Intake:  Pre-visit preparation completed: Yes  Pain : No/denies pain     BMI - recorded: 33.29 Nutritional Status: BMI > 30  Obese Nutritional Risks: None Diabetes: Yes CBG done?: No Did pt. bring in CBG monitor from home?: No  How often do you need to have someone help you when you read instructions, pamphlets, or other written materials from your doctor or pharmacy?: 1 - Never  Interpreter Needed?: No  Information entered by :: R. Vlada Uriostegui LPN   Activities of Daily Living    01/21/2024    3:28 PM  In your present state of health, do you have any difficulty performing the following activities:  Hearing? 0  Vision? 0  Comment glasses  Difficulty concentrating or making decisions? 0  Walking or climbing stairs? 0  Dressing or bathing? 0  Doing errands, shopping? 0  Preparing Food and eating ? N  Using the Toilet? N  In the past six months, have you accidently leaked urine? N  Do you have problems with loss of bowel control? N  Managing your Medications? N  Managing your Finances? N  Housekeeping or managing your Housekeeping? N    Patient Care Team: Sherlene Shams, MD as PCP - General (Internal Medicine) Kieth Brightly, MD (General Surgery) Serena Croissant, MD as Consulting Physician (Hematology and Oncology) Axel Filler, Larna Daughters, NP as Nurse Practitioner (Hematology and Oncology) Reana Puffer, MD as Consulting Physician (Radiation Oncology) Emelia Loron, MD as Consulting Physician (General Surgery)  Indicate any recent Medical Services you may have received from other than [facility] providers in the past year (date may be approximate).     Assessment:   This is a routine wellness examination for Illana.  Hearing/Vision screen Hearing Screening - Comments:: No issues Vision Screening - Comments:: glasses   Goals Addressed             This Visit's Progress    Patient Stated       Wants  to start an exercise program  Depression Screen    01/21/2024    3:34 PM 03/19/2023   10:04 AM 11/06/2022    3:27 PM 09/18/2022   10:04 AM 08/30/2022    2:42 PM 05/05/2022    4:34 PM 03/17/2022    3:58 PM  PHQ 2/9 Scores  PHQ - 2 Score 0 0 0 0 0 0 0  PHQ- 9 Score 0          Fall Risk    01/21/2024    3:29 PM 03/19/2023   10:04 AM 11/06/2022    3:29 PM 09/18/2022   10:04 AM 08/30/2022    2:42 PM  Fall Risk   Falls in the past year? 1 0 0 0 0  Number falls in past yr: 0 0 0    Injury with Fall? 1 0 0    Comment bruises      Risk for fall due to : History of fall(s);Impaired balance/gait No Fall Risks No Fall Risks No Fall Risks No Fall Risks  Follow up Falls evaluation completed;Falls prevention discussed Falls evaluation completed Falls evaluation completed Falls evaluation completed Falls evaluation completed    MEDICARE RISK AT HOME: Medicare Risk at Home Any stairs in or around the home?: Yes If so, are there any without handrails?: No Home free of loose throw rugs in walkways, pet beds, electrical cords, etc?: Yes Adequate lighting in your home to reduce risk of falls?: Yes Life alert?: No Use of a cane, walker or w/c?: No Grab bars in the bathroom?: No Shower chair or bench in shower?: No Elevated toilet seat or a handicapped toilet?: Yes      Cognitive Function:    10/03/2018   10:08 AM 10/02/2017    2:44 PM 09/20/2016    8:40 AM  MMSE - Mini Mental State Exam  Orientation to time 5 5 5   Orientation to Place 5 5 5   Registration 3 3 3   Attention/ Calculation 5 5 5   Recall 3 3 3   Language- name 2 objects 2 2 2   Language- repeat 1 1 1   Language- follow 3 step command 3 3 3   Language- read & follow direction 1 1 1   Write a sentence 1 1 1   Copy design 1 1 1   Total score 30 30 30         01/21/2024    3:37 PM 11/06/2022    3:32 PM 11/01/2020   10:01 AM 10/30/2019    9:49 AM  6CIT Screen  What Year? 0 points 0 points 0 points 0 points  What month? 0  points 0 points 0 points 0 points  What time? 0 points 0 points 0 points 0 points  Count back from 20 0 points 0 points 0 points 0 points  Months in reverse 0 points 0 points 0 points 0 points  Repeat phrase 0 points 0 points 0 points 0 points  Total Score 0 points 0 points 0 points 0 points    Immunizations Immunization History  Administered Date(s) Administered   Fluad Quad(high Dose 65+) 09/11/2019, 10/06/2020, 09/19/2021, 09/12/2022   Hep A / Hep B 04/25/2021, 05/30/2021, 09/29/2021   Influenza Split 10/15/2014   Influenza, High Dose Seasonal PF 10/03/2018   Influenza,inj,Quad PF,6+ Mos 09/20/2016   Influenza-Unspecified 10/24/2012, 10/12/2013, 10/01/2017   PFIZER(Purple Top)SARS-COV-2 Vaccination 02/06/2020, 03/02/2020, 10/08/2020   PNEUMOCOCCAL CONJUGATE-20 11/02/2021   Pfizer Covid-19 Vaccine Bivalent Booster 7yrs & up 09/19/2021, 05/17/2022   Pfizer(Comirnaty)Fall Seasonal Vaccine 12 years and older 09/30/2022   Pneumococcal Conjugate-13  06/21/2015, 08/25/2017   Pneumococcal Polysaccharide-23 10/24/2012   Respiratory Syncytial Virus Vaccine,Recomb Aduvanted(Arexvy) 09/20/2023   Tdap 07/24/2009, 10/10/2019, 10/31/2019    TDAP status: Up to date  Flu Vaccine status: Up to date  Pneumococcal vaccine status: Up to date  Covid-19 vaccine status: Completed vaccines  Qualifies for Shingles Vaccine? Yes   Zostavax completed NO Shingrix Completed?: No.    Education has been provided regarding the importance of this vaccine. Patient has been advised to call insurance company to determine out of pocket expense if they have not yet received this vaccine. Advised may also receive vaccine at local pharmacy or Health Dept. Verbalized acceptance and understanding.  Screening Tests Health Maintenance  Topic Date Due   Zoster Vaccines- Shingrix (1 of 2) Never done   COVID-19 Vaccine (7 - 2024-25 season) 08/26/2023   Medicare Annual Wellness (AWV)  11/07/2023   HEMOGLOBIN A1C   01/30/2024   Diabetic kidney evaluation - eGFR measurement  03/18/2024   Diabetic kidney evaluation - Urine ACR  07/29/2024   OPHTHALMOLOGY EXAM  08/15/2024   FOOT EXAM  09/24/2024   MAMMOGRAM  03/25/2025   Fecal DNA (Cologuard)  10/03/2026   DTaP/Tdap/Td (4 - Td or Tdap) 10/30/2029   Pneumonia Vaccine 13+ Years old  Completed   INFLUENZA VACCINE  Completed   DEXA SCAN  Completed   HPV VACCINES  Aged Out   Colonoscopy  Discontinued    Health Maintenance  Health Maintenance Due  Topic Date Due   Zoster Vaccines- Shingrix (1 of 2) Never done   COVID-19 Vaccine (7 - 2024-25 season) 08/26/2023   Medicare Annual Wellness (AWV)  11/07/2023    Colorectal cancer screening: Type of screening: Cologuard. Completed 09/2023. Repeat every 3 years   Mammogram status: Completed 03/2023. Repeat every year  Bone Density status: Completed 07/2015. Results reflect: Bone density results: NORMAL. Repeat every 2 years. Will discuss with Dr. Darrick Huntsman at next visit  Lung Cancer Screening: (Low Dose CT Chest recommended if Age 53-80 years, 20 pack-year currently smoking OR have quit w/in 15years.) does not qualify.     Additional Screening:  Hepatitis C Screening: does qualify; Completed 12/20/20  Vision Screening: Recommended annual ophthalmology exams for early detection of glaucoma and other disorders of the eye. Is the patient up to date with their annual eye exam?  Yes  Who is the provider or what is the name of the office in which the patient attends annual eye exams? [facility] If pt is not established with a provider, would they like to be referred to a provider to establish care? No .   Dental Screening: Recommended annual dental exams for proper oral hygiene  Diabetic Foot Exam: Diabetic Foot Exam: Completed 09/2023  Community Resource Referral / Chronic Care Management: CRR required this visit?  No   CCM required this visit?  No     Plan:     I have personally reviewed and  noted the following in the patient's chart:   Medical and social history Use of alcohol, tobacco or illicit drugs  Current medications and supplements including opioid prescriptions. Patient is not currently taking opioid prescriptions. Functional ability and status Nutritional status Physical activity Advanced directives List of other physicians Hospitalizations, surgeries, and ER visits in previous 12 months Vitals Screenings to include cognitive, depression, and falls Referrals and appointments  In addition, I have reviewed and discussed with patient certain preventive protocols, quality metrics, and best practice recommendations. A written personalized care plan for preventive services  as well as general preventive health recommendations were provided to patient.     Sydell Axon, LPN   [id number]   After Visit Summary: (MyChart) Due to this being a telephonic visit, the after visit summary with patients personalized plan was offered to patient via MyChart   Nurse Notes: None

## 2024-01-21 NOTE — Patient Instructions (Signed)
Martha [name] , Thank you for taking time to come for your Medicare Wellness Visit. I appreciate your ongoing commitment to your health goals. Please review the following plan we discussed and let me know if I can assist you in the future.   Referrals/Orders/Follow-Ups/Clinician Recommendations: Your vaccines have been updated. Consider updating your shingles vaccines.  This is a list of the screening recommended for you and due dates:  Health Maintenance  Topic Date Due   Zoster (Shingles) Vaccine (1 of 2) Never done   COVID-19 Vaccine (8 - 2024-25 season) 11/22/2023   Hemoglobin A1C  01/30/2024   Yearly kidney function blood test for diabetes  03/18/2024   Yearly kidney health urinalysis for diabetes  07/29/2024   Eye exam for diabetics  08/15/2024   Complete foot exam   09/24/2024   Medicare Annual Wellness Visit  01/20/2025   Mammogram  03/25/2025   Cologuard (Stool DNA test)  10/03/2026   DTaP/Tdap/Td vaccine (4 - Td or Tdap) 10/30/2029   Pneumonia Vaccine  Completed   Flu Shot  Completed   DEXA scan (bone density measurement)  Completed   HPV Vaccine  Aged Out   Colon Cancer Screening  Discontinued    Advanced directives: (Declined) Advance directive discussed with you today. Even though you declined this today, please call our office should you change your mind, and we can give you the proper paperwork for you to fill out.  Next Medicare Annual Wellness Visit scheduled for next year: Yes 01/26/25 #@ 3:00

## 2024-01-23 ENCOUNTER — Other Ambulatory Visit (INDEPENDENT_AMBULATORY_CARE_PROVIDER_SITE_OTHER): Payer: Medicare HMO

## 2024-01-23 DIAGNOSIS — I10 Essential (primary) hypertension: Secondary | ICD-10-CM

## 2024-01-23 DIAGNOSIS — E7800 Pure hypercholesterolemia, unspecified: Secondary | ICD-10-CM

## 2024-01-23 DIAGNOSIS — E1121 Type 2 diabetes mellitus with diabetic nephropathy: Secondary | ICD-10-CM

## 2024-01-23 LAB — HEMOGLOBIN A1C: Hgb A1c MFr Bld: 6.1 % (ref 4.6–6.5)

## 2024-01-23 NOTE — Addendum Note (Signed)
Addended by: Warden Fillers on: 01/23/2024 09:52 AM   Modules accepted: Orders

## 2024-01-26 ENCOUNTER — Encounter: Payer: Self-pay | Admitting: Internal Medicine

## 2024-01-29 ENCOUNTER — Ambulatory Visit: Payer: Medicare HMO | Admitting: Internal Medicine

## 2024-02-05 ENCOUNTER — Encounter: Payer: Self-pay | Admitting: Internal Medicine

## 2024-02-07 NOTE — Telephone Encounter (Signed)
Please see the telephone encounter sent in the pt's mother's chart.

## 2024-02-18 ENCOUNTER — Other Ambulatory Visit (INDEPENDENT_AMBULATORY_CARE_PROVIDER_SITE_OTHER): Payer: Medicare HMO

## 2024-02-18 ENCOUNTER — Encounter: Payer: Self-pay | Admitting: Internal Medicine

## 2024-02-18 DIAGNOSIS — I10 Essential (primary) hypertension: Secondary | ICD-10-CM | POA: Diagnosis not present

## 2024-02-18 DIAGNOSIS — E7800 Pure hypercholesterolemia, unspecified: Secondary | ICD-10-CM | POA: Diagnosis not present

## 2024-02-18 LAB — LDL CHOLESTEROL, DIRECT: Direct LDL: 77 mg/dL

## 2024-02-18 LAB — COMPREHENSIVE METABOLIC PANEL
ALT: 7 U/L (ref 0–35)
AST: 13 U/L (ref 0–37)
Albumin: 4.1 g/dL (ref 3.5–5.2)
Alkaline Phosphatase: 89 U/L (ref 39–117)
BUN: 19 mg/dL (ref 6–23)
CO2: 30 meq/L (ref 19–32)
Calcium: 9.4 mg/dL (ref 8.4–10.5)
Chloride: 104 meq/L (ref 96–112)
Creatinine, Ser: 0.96 mg/dL (ref 0.40–1.20)
GFR: 58.61 mL/min — ABNORMAL LOW (ref >60.00)
Glucose, Bld: 110 mg/dL — ABNORMAL HIGH (ref 70–99)
Potassium: 4.3 meq/L (ref 3.5–5.1)
Sodium: 141 meq/L (ref 135–145)
Total Bilirubin: 0.5 mg/dL (ref 0.2–1.2)
Total Protein: 7.1 g/dL (ref 6.0–8.3)

## 2024-02-18 LAB — LIPID PANEL
Cholesterol: 150 mg/dL (ref 0–200)
HDL: 66.4 mg/dL (ref >39.00)
LDL Cholesterol: 73 mg/dL (ref 0–99)
NonHDL: 83.15
Total CHOL/HDL Ratio: 2
Triglycerides: 52 mg/dL (ref 0.0–149.0)
VLDL: 10.4 mg/dL (ref 0.0–40.0)

## 2024-02-19 ENCOUNTER — Encounter: Payer: Self-pay | Admitting: Internal Medicine

## 2024-02-22 ENCOUNTER — Other Ambulatory Visit: Payer: Self-pay | Admitting: Internal Medicine

## 2024-02-22 MED ORDER — OZEMPIC (2 MG/DOSE) 8 MG/3ML ~~LOC~~ SOPN: 2.0000 mg | PEN_INJECTOR | SUBCUTANEOUS | 0 refills | Status: AC

## 2024-02-22 MED ORDER — OZEMPIC (2 MG/DOSE) 8 MG/3ML ~~LOC~~ SOPN: 2.0000 mg | PEN_INJECTOR | SUBCUTANEOUS | 0 refills | Status: DC

## 2024-02-22 NOTE — Telephone Encounter (Signed)
 Copied from CRM [id number]. Topic: Clinical - Medication Question >> Feb 22, 2024 11:53 AM [name] wrote: Reason for CRM: Patient is calling in regarding her Ozeimpi, states a one month supply was supposed to be called in to [facility] while she's waiting for other order. Please reach out to patient for some clarity, thanks.  [name] [phone]

## 2024-02-22 NOTE — Telephone Encounter (Signed)
 Pt is on med assistant is there any update with her application?

## 2024-02-22 NOTE — Addendum Note (Signed)
 Addended by: Benedict Needy on: 02/22/2024 03:57 PM   Modules accepted: Orders

## 2024-02-26 ENCOUNTER — Ambulatory Visit: Payer: Medicare HMO | Admitting: Internal Medicine

## 2024-03-10 ENCOUNTER — Other Ambulatory Visit: Payer: Self-pay | Admitting: Adult Health

## 2024-03-10 ENCOUNTER — Other Ambulatory Visit: Payer: Self-pay | Admitting: *Deleted

## 2024-03-10 DIAGNOSIS — C50411 Malignant neoplasm of upper-outer quadrant of right female breast: Secondary | ICD-10-CM

## 2024-03-13 ENCOUNTER — Telehealth: Payer: Self-pay

## 2024-03-13 NOTE — Telephone Encounter (Signed)
 Spoke with pt to let her know that we have received her patient assistance medication at the office and it is ready for pick up. Pt stated that she would pick it up today.    Ozempic 2 mg: 4 boxes

## 2024-03-13 NOTE — Telephone Encounter (Signed)
 Patient has picked up medication

## 2024-03-13 NOTE — Telephone Encounter (Signed)
 Error

## 2024-03-14 ENCOUNTER — Encounter: Payer: Medicare HMO | Admitting: Adult Health

## 2024-03-18 ENCOUNTER — Other Ambulatory Visit: Payer: Self-pay | Admitting: Internal Medicine

## 2024-03-18 NOTE — Telephone Encounter (Signed)
 Pt gets rx through patient assistance program. Is it okay to refuse refill request.

## 2024-03-21 ENCOUNTER — Encounter: Payer: Self-pay | Admitting: Internal Medicine

## 2024-03-21 ENCOUNTER — Ambulatory Visit
Admission: RE | Admit: 2024-03-21 | Discharge: 2024-03-21 | Disposition: A | Discharge status: Home / Self Care | Source: Ambulatory Visit | Attending: Adult Health | Admitting: Adult Health

## 2024-03-21 ENCOUNTER — Other Ambulatory Visit: Payer: Self-pay | Admitting: Adult Health

## 2024-03-21 DIAGNOSIS — C50411 Malignant neoplasm of upper-outer quadrant of right female breast: Secondary | ICD-10-CM

## 2024-03-21 DIAGNOSIS — M79621 Pain in right upper arm: Secondary | ICD-10-CM | POA: Diagnosis not present

## 2024-03-21 DIAGNOSIS — R921 Mammographic calcification found on diagnostic imaging of breast: Secondary | ICD-10-CM | POA: Diagnosis not present

## 2024-03-24 ENCOUNTER — Other Ambulatory Visit: Payer: Self-pay | Admitting: Adult Health

## 2024-03-24 DIAGNOSIS — R921 Mammographic calcification found on diagnostic imaging of breast: Secondary | ICD-10-CM

## 2024-03-24 NOTE — Telephone Encounter (Signed)
 The referral has been placed. Someone should reach out to you later this week. If you do not please let us know so we can check on the referral.

## 2024-03-25 ENCOUNTER — Ambulatory Visit: Payer: Medicare HMO | Admitting: Internal Medicine

## 2024-03-31 ENCOUNTER — Inpatient Hospital Stay: Attending: Adult Health | Admitting: Adult Health

## 2024-03-31 ENCOUNTER — Ambulatory Visit
Admission: RE | Admit: 2024-03-31 | Discharge: 2024-03-31 | Disposition: A | Discharge status: Home / Self Care | Source: Ambulatory Visit | Attending: Adult Health | Admitting: Adult Health

## 2024-03-31 DIAGNOSIS — Z1732 Human epidermal growth factor receptor 2 negative status: Secondary | ICD-10-CM | POA: Insufficient documentation

## 2024-03-31 DIAGNOSIS — R92321 Mammographic fibroglandular density, right breast: Secondary | ICD-10-CM | POA: Diagnosis not present

## 2024-03-31 DIAGNOSIS — N6031 Fibrosclerosis of right breast: Secondary | ICD-10-CM | POA: Diagnosis not present

## 2024-03-31 DIAGNOSIS — R921 Mammographic calcification found on diagnostic imaging of breast: Secondary | ICD-10-CM

## 2024-03-31 DIAGNOSIS — Z923 Personal history of irradiation: Secondary | ICD-10-CM | POA: Insufficient documentation

## 2024-03-31 DIAGNOSIS — Z79811 Long term (current) use of aromatase inhibitors: Secondary | ICD-10-CM | POA: Insufficient documentation

## 2024-03-31 DIAGNOSIS — R920 Mammographic microcalcification found on diagnostic imaging of breast: Secondary | ICD-10-CM | POA: Diagnosis not present

## 2024-03-31 DIAGNOSIS — Z87891 Personal history of nicotine dependence: Secondary | ICD-10-CM | POA: Insufficient documentation

## 2024-03-31 DIAGNOSIS — C50411 Malignant neoplasm of upper-outer quadrant of right female breast: Secondary | ICD-10-CM | POA: Insufficient documentation

## 2024-03-31 DIAGNOSIS — Z170 Estrogen receptor positive status [ER+]: Secondary | ICD-10-CM | POA: Insufficient documentation

## 2024-03-31 DIAGNOSIS — Z1721 Progesterone receptor positive status: Secondary | ICD-10-CM | POA: Insufficient documentation

## 2024-03-31 HISTORY — PX: BREAST BIOPSY: SHX20

## 2024-04-01 LAB — SURGICAL PATHOLOGY

## 2024-04-02 ENCOUNTER — Ambulatory Visit: Payer: Medicare HMO | Admitting: Internal Medicine

## 2024-04-02 ENCOUNTER — Other Ambulatory Visit: Payer: Self-pay | Admitting: Internal Medicine

## 2024-04-07 ENCOUNTER — Inpatient Hospital Stay (HOSPITAL_BASED_OUTPATIENT_CLINIC_OR_DEPARTMENT_OTHER): Admitting: Adult Health

## 2024-04-07 ENCOUNTER — Inpatient Hospital Stay: Admitting: Adult Health

## 2024-04-07 ENCOUNTER — Encounter: Payer: Self-pay | Admitting: Adult Health

## 2024-04-07 VITALS — BP 137/54 | HR 77 | Temp 98.4°F | Resp 18 | Ht 62.0 in | Wt 185.5 lb

## 2024-04-07 DIAGNOSIS — Z1732 Human epidermal growth factor receptor 2 negative status: Secondary | ICD-10-CM | POA: Diagnosis not present

## 2024-04-07 DIAGNOSIS — C50411 Malignant neoplasm of upper-outer quadrant of right female breast: Secondary | ICD-10-CM

## 2024-04-07 DIAGNOSIS — Z923 Personal history of irradiation: Secondary | ICD-10-CM | POA: Diagnosis not present

## 2024-04-07 DIAGNOSIS — Z170 Estrogen receptor positive status [ER+]: Secondary | ICD-10-CM | POA: Diagnosis not present

## 2024-04-07 DIAGNOSIS — Z79811 Long term (current) use of aromatase inhibitors: Secondary | ICD-10-CM | POA: Diagnosis not present

## 2024-04-07 DIAGNOSIS — Z1721 Progesterone receptor positive status: Secondary | ICD-10-CM | POA: Diagnosis not present

## 2024-04-07 DIAGNOSIS — Z87891 Personal history of nicotine dependence: Secondary | ICD-10-CM | POA: Diagnosis not present

## 2024-04-07 NOTE — Progress Notes (Deleted)
 [facility] Cancer Follow up:    Martha Shams, MD [address] [place] [address]   DIAGNOSIS: Cancer Staging  Malignant neoplasm of upper-outer quadrant of right breast in female, estrogen receptor positive (HCC) Staging form: Breast, AJCC 8th Edition - Pathologic: Stage IA (pT91mi, pN0, cM0, G1, ER+, PR+, HER2-) - Unsigned Histologic grading system: 3 grade system    SUMMARY OF ONCOLOGIC HISTORY: Oncology History  Malignant neoplasm of upper-outer quadrant of right breast in female, estrogen receptor positive (HCC)  04/09/2017 Mammogram   Right breast mass 9:00 position 1 cm from nipple: 4 mm; small asymmetry in the upper outer quadrant measuring 1 cm   04/20/2017 Initial Diagnosis   Right breast stereotactic biopsy: IDC with DCIS, biopsy 9:00 position: [facility], ER 100%, PR 100% HER-2 pending   05/16/2017 Surgery   Right lumpectomy: IDC grade 1, microscopic focus, low-grade DCIS, 0/3 lymph nodes negative, Atypical lobular hyperplasia, ER 100%, PR 100%, HER-2 negative ratio 1.63, Ki-67 insufficient, T61mic, N0 stage IA   06/18/2017 - 07/17/2017 Radiation Therapy   Adjuvant radiation therapy   09/2017 -  Anti-estrogen oral therapy   Anastrozole 1mg  daily     CURRENT THERAPY: observation  INTERVAL HISTORY:  Martha [name] 73 y.o. female returns for    Patient Active Problem List   Diagnosis Date Noted   Left medial knee pain 08/30/2022   Eustachian tube dysfunction, left 05/07/2022   Peripheral vascular disease of lower extremity (HCC) 03/18/2022   B12 deficiency anemia 03/18/2022   Caregiver with fatigue 11/02/2021   Lumbar radiculopathy 09/12/2021   Pain in joint of right hip 08/22/2021   Arthritis of lumbar spine 08/01/2021   Hip arthritis 08/01/2021   Fatty liver 12/21/2020   Hepatic cyst 12/21/2020   Sludge in gallbladder 12/21/2020   Elevated alkaline phosphatase level 05/15/2020   Visit for preventive health examination  11/01/2019   CKD stage 3 due to type 2 diabetes mellitus (HCC) 07/20/2019   Use of anastrozole (Arimidex) 01/03/2018   Rash and nonspecific skin eruption 06/16/2017   Malignant neoplasm of upper-outer quadrant of right breast in female, estrogen receptor positive (HCC) 04/26/2017   Low back pain 01/29/2017   Allergic rhinitis 04/12/2015   Controlled type 2 diabetes mellitus with diabetic nephropathy, without long-term current use of insulin (HCC) 07/24/2013   Hyperlipidemia 07/24/2013   Essential hypertension, benign 07/24/2013   Status post dilation of esophageal narrowing 07/24/2013   Myalgia due to statin 07/24/2013   Class 2 severe obesity due to excess calories with serious comorbidity and body mass index (BMI) of 36.0 to 36.9 in adult (HCC) 07/24/2013    has no known allergies.  MEDICAL HISTORY: Past Medical History:  Diagnosis Date   Arthritis    Atypical ductal hyperplasia of right breast 11/20/2016   Cancer (HCC) 04/2017   right breast cancer   Colon polyp 2008   Diabetes mellitus without complication [facility])    Endometrial hyperplasia 2013   Fatty liver    GERD (gastroesophageal reflux disease)    OCC-NO MEDS   Headache    H/O MIGRAINES   Hypertension    Papilloma 11/20/2016   right breast   Personal history of radiation therapy    2018   Status post dilation of esophageal narrowing 2008    SURGICAL HISTORY: Past Surgical History:  Procedure Laterality Date   BREAST BIOPSY Right 11/02/2016   ATYPICAL PAPILLARY LESION,   BREAST BIOPSY Right 03/31/2024   MM RT BREAST  BX W LOC DEV 1ST LESION IMAGE BX SPEC STEREO GUIDE 03/31/2024 [facility]   BREAST EXCISIONAL BIOPSY Right 11/20/2016   AHD   BREAST LUMPECTOMY Right 11/20/2016   Procedure: BREAST LUMPECTOMY;  Surgeon: Kieth Brightly, MD;  Location: [facility] ORS;  Service: General;  Laterality: Right;   BREAST LUMPECTOMY Right 04/2017   lumpectomy 2018, excision 2017   COLONOSCOPY  2008   in [place]    DILATION AND CURETTAGE OF UTERUS     ESOPHAGOGASTRODUODENOSCOPY (EGD) WITH PROPOFOL N/A 02/21/2021   Procedure: ESOPHAGOGASTRODUODENOSCOPY (EGD) WITH DILATION ;  Surgeon: Midge Minium, MD;  Location: [facility];  Service: Endoscopy;  Laterality: N/A;  Diabetic - oral meds   RADIOACTIVE SEED GUIDED PARTIAL MASTECTOMY WITH AXILLARY SENTINEL LYMPH NODE BIOPSY Right 05/16/2017   Procedure: RIGHT RADIOACTIVE SEED GUIDED LUMPECTOMY  WITH  RIGHT BREAST SEED GUIDED EXCISIONAL BIOPSY AND RIGHT AXILLARY SENTINEL  NODE BIOPSY;  Surgeon: Emelia Loron, MD;  Location: [facility];  Service: General;  Laterality: Right;  2 SEEDS RIGHT BREAST  GENERAL AND PEC BLOCK ANESHTESIA    SOCIAL HISTORY: Social History   Socioeconomic History   Marital status: Married    Spouse name: Not on file   Number of children: Not on file   Years of education: Not on file   Highest education level: Not on file  Occupational History   Not on file  Tobacco Use   Smoking status: Former    Current packs/day: 0.00    Average packs/day: 0.3 packs/day for 5.0 years (1.3 ttl pk-yrs)    Types: Cigarettes    Start date: 07/24/1985    Quit date: 07/24/1990    Years since quitting: 33.7   Smokeless tobacco: Never  Vaping Use   Vaping status: Never Used  Substance and Sexual Activity   Alcohol use: Yes    Comment: Socially   Drug use: No   Sexual activity: Not Currently  Other Topics Concern   Not on file  Social History Narrative   Lives in [place]. From [place]. Son lives with pt. Dog in home.      Work - [employer], and Interior and spatial designer, now retired.      Diet - regular      Exercise - no regular   Social Drivers of Health   Financial Resource Strain: Low Risk  (01/21/2024)   Overall Financial Resource Strain (CARDIA)    Difficulty of Paying Living Expenses: Not hard at all  Food Insecurity: No Food Insecurity (01/21/2024)   Hunger Vital Sign    Worried About Running Out of Food in the Last Year:  Never true    Ran Out of Food in the Last Year: Never true  Transportation Needs: No Transportation Needs (01/21/2024)   PRAPARE - Administrator, Civil Service (Medical): No    Lack of Transportation (Non-Medical): No  Physical Activity: Inactive (01/21/2024)   Exercise Vital Sign    Days of Exercise per Week: 0 days    Minutes of Exercise per Session: 0 min  Stress: No Stress Concern Present (01/21/2024)   [employer] of Occupational Health - Occupational Stress Questionnaire    Feeling of Stress : Not at all  Social Connections: Socially Integrated (01/21/2024)   Social Connection and Isolation Panel [NHANES]    Frequency of Communication with Friends and Family: More than three times a week    Frequency of Social Gatherings with Friends and Family: More than three times a week  Attends Religious Services: More than 4 times per year    Active Member of Clubs or Organizations: Yes    Attends Banker Meetings: More than 4 times per year    Marital Status: Married  Catering manager Violence: Not At Risk (01/21/2024)   Humiliation, Afraid, Rape, and Kick questionnaire    Fear of Current or Ex-Partner: No    Emotionally Abused: No    Physically Abused: No    Sexually Abused: No    FAMILY HISTORY: Family History  Problem Relation Age of Onset   Hypertension Mother    Heart disease Father 67   Bone cancer Brother    Hypertension Daughter    Cancer Maternal Aunt 23       breast and ovary   Breast cancer Maternal Aunt    Pancreatic cancer Maternal Aunt    Lymphoma Maternal Aunt     Review of Systems - Oncology    PHYSICAL EXAMINATION    There were no vitals filed for this visit.  Physical Exam  LABORATORY DATA:  CBC    Component Value Date/Time   WBC 5.9 03/19/2023 1040   RBC 3.94 03/19/2023 1040   HGB 12.2 03/19/2023 1040   HCT 37.0 03/19/2023 1040   PLT 380.0 03/19/2023 1040   MCV 93.8 03/19/2023 1040   MCH 30.8 03/17/2022 1638    MCHC 33.1 03/19/2023 1040   RDW 14.0 03/19/2023 1040   LYMPHSABS 1.9 03/19/2023 1040   MONOABS 0.5 03/19/2023 1040   EOSABS 0.1 03/19/2023 1040   BASOSABS 0.1 03/19/2023 1040    CMP     Component Value Date/Time   NA 141 02/18/2024 0838   K 4.3 02/18/2024 0838   CL 104 02/18/2024 0838   CO2 30 02/18/2024 0838   GLUCOSE 110 (H) 02/18/2024 0838   BUN 19 02/18/2024 0838   CREATININE 0.96 02/18/2024 0838   CREATININE 1.06 (H) 03/17/2022 1638   CALCIUM 9.4 02/18/2024 0838   PROT 7.1 02/18/2024 0838   ALBUMIN 4.1 02/18/2024 0838   AST 13 02/18/2024 0838   ALT 7 02/18/2024 0838   ALKPHOS 89 02/18/2024 0838   BILITOT 0.5 02/18/2024 0838   GFRNONAA 53 (L) 05/14/2017 0928   GFRAA >60 05/14/2017 0928     ASSESSMENT and THERAPY PLAN:   No problem-specific Assessment & Plan notes found for this encounter.     All questions were answered. The patient knows to call the clinic with any problems, questions or concerns. We can certainly see the patient much sooner if necessary.  Total encounter time:*** minutes*in face-to-face visit time, chart review, lab review, care coordination, order entry, and documentation of the encounter time.    Alwin Baars, NP 04/07/24 1:04 PM Medical Oncology and Hematology [facility] [address] [place], [place] [address] Tel. [phone]    Fax. [phone]  *Total Encounter Time as defined by the Centers for Medicare and Medicaid Services includes, in addition to the face-to-face time of a patient visit (documented in the note above) non-face-to-face time: obtaining and reviewing outside history, ordering and reviewing medications, tests or procedures, care coordination (communications with other health care professionals or caregivers) and documentation in the medical record.

## 2024-04-07 NOTE — Assessment & Plan Note (Signed)
 Martha [name] is a 73 year old woman with history of right sided breast invasive ductal carcinoma, ER/PR positive, HER2 negative, diagnosed in May 2028 status postlumpectomy, adjuvant radiation, and 5 years of anastrozole therapy that she completed in October 2023.  History of stage Ia breast cancer: She has no clinical or radiographic sign of breast cancer recurrence.  She will continue on observation alone.  She will repeat mammogram annually. Health maintenance: We reviewed healthy diet and exercise.  Her goal this year is to increase steps daily.  We discussed strategies that can help her achieve this goal. I recommended she continue follow-up with her primary care provider regularly for her preventative health care.  We will see Martha [name] back in a year for continued long-term follow-up.  She knows to call for any questions or concerns that may arise between now and then as we are happy to see her much sooner if needed.

## 2024-04-07 NOTE — Progress Notes (Signed)
 This encounter was created in error - please disregard.

## 2024-04-07 NOTE — Progress Notes (Signed)
 [facility] Cancer Follow up:    Martha Shams, MD [address] [place] [address]   DIAGNOSIS:  Cancer Staging  Malignant neoplasm of upper-outer quadrant of right breast in female, estrogen receptor positive (HCC) Staging form: Breast, AJCC 8th Edition - Pathologic: Stage IA (pT79mi, pN0, cM0, G1, ER+, PR+, HER2-) - Unsigned Histologic grading system: 3 grade system    SUMMARY OF ONCOLOGIC HISTORY: Oncology History  Malignant neoplasm of upper-outer quadrant of right breast in female, estrogen receptor positive (HCC)  04/09/2017 Mammogram   Right breast mass 9:00 position 1 cm from nipple: 4 mm; small asymmetry in the upper outer quadrant measuring 1 cm   04/20/2017 Initial Diagnosis   Right breast stereotactic biopsy: IDC with DCIS, biopsy 9:00 position: [facility], ER 100%, PR 100% HER-2 pending   05/16/2017 Surgery   Right lumpectomy: IDC grade 1, microscopic focus, low-grade DCIS, 0/3 lymph nodes negative, Atypical lobular hyperplasia, ER 100%, PR 100%, HER-2 negative ratio 1.63, Ki-67 insufficient, T62mic, N0 stage IA   06/18/2017 - 07/17/2017 Radiation Therapy   Adjuvant radiation therapy   09/2017 -  Anti-estrogen oral therapy   Anastrozole 1mg  daily     CURRENT THERAPY: Observation  INTERVAL HISTORY:  Martha [name] 73 y.o. female returns for follow-up and evaluation of her history of breast cancer.  She has completed anastrozole therapy and continues on observation alone.  He underwent a bilateral breast diagnostic mammogram on March 21, 2024 that determined a biopsy was needed at the right breast.  Biopsy completed April 01, 2024 demonstrated dense stromal fibrosis with focal giant cells and calcifications in the upper outer quadrant of the right breast.  This was found to be concordant.  She tells me that after this care she is doing quite well and relieved to not have recurrent breast cancer.  She tells me that her goal this year is to  exercise more.  She has gotten her blood sugar under better control with her primary care provider and plans to get more steps in every day.   Patient Active Problem List   Diagnosis Date Noted   Left medial knee pain 08/30/2022   Eustachian tube dysfunction, left 05/07/2022   Peripheral vascular disease of lower extremity (HCC) 03/18/2022   B12 deficiency anemia 03/18/2022   Caregiver with fatigue 11/02/2021   Lumbar radiculopathy 09/12/2021   Pain in joint of right hip 08/22/2021   Arthritis of lumbar spine 08/01/2021   Hip arthritis 08/01/2021   Fatty liver 12/21/2020   Hepatic cyst 12/21/2020   Sludge in gallbladder 12/21/2020   Elevated alkaline phosphatase level 05/15/2020   Visit for preventive health examination 11/01/2019   CKD stage 3 due to type 2 diabetes mellitus (HCC) 07/20/2019   Use of anastrozole (Arimidex) 01/03/2018   Rash and nonspecific skin eruption 06/16/2017   Malignant neoplasm of upper-outer quadrant of right breast in female, estrogen receptor positive (HCC) 04/26/2017   Low back pain 01/29/2017   Allergic rhinitis 04/12/2015   Controlled type 2 diabetes mellitus with diabetic nephropathy, without long-term current use of insulin (HCC) 07/24/2013   Hyperlipidemia 07/24/2013   Essential hypertension, benign 07/24/2013   Status post dilation of esophageal narrowing 07/24/2013   Myalgia due to statin 07/24/2013   Class 2 severe obesity due to excess calories with serious comorbidity and body mass index (BMI) of 36.0 to 36.9 in adult (HCC) 07/24/2013    has no known allergies.  MEDICAL HISTORY: Past Medical History:  Diagnosis  Date   Arthritis    Atypical ductal hyperplasia of right breast 11/20/2016   Cancer (HCC) 04/2017   right breast cancer   Colon polyp 2008   Diabetes mellitus without complication (HCC)    Endometrial hyperplasia 2013   Fatty liver    GERD (gastroesophageal reflux disease)    OCC-NO MEDS   Headache    H/O MIGRAINES    Hypertension    Papilloma 11/20/2016   right breast   Personal history of radiation therapy    2018   Status post dilation of esophageal narrowing 2008    SURGICAL HISTORY: Past Surgical History:  Procedure Laterality Date   BREAST BIOPSY Right 11/02/2016   ATYPICAL PAPILLARY LESION,   BREAST BIOPSY Right 03/31/2024   MM RT BREAST BX W LOC DEV 1ST LESION IMAGE BX SPEC STEREO GUIDE 03/31/2024 [facility]MO[facility]   BREAST EXCISIONAL BIOPSY Right 11/20/2016   AHD   BREAST LUMPECTOMY Right 11/20/2016   Procedure: BREAST LUMPECTOMY;  Surgeon: Kieth Brightly, MD;  Location: [facility];  Service: General;  Laterality: Right;   BREAST LUMPECTOMY Right 04/2017   lumpectomy 2018, excision 2017   COLONOSCOPY  2008   in [place]   DILATION AND CURETTAGE OF UTERUS     ESOPHAGOGASTRODUODENOSCOPY (EGD) WITH PROPOFOL N/A 02/21/2021   Procedure: ESOPHAGOGASTRODUODENOSCOPY (EGD) WITH DILATION ;  Surgeon: Midge Minium, MD;  Location: [facility];  Service: Endoscopy;  Laterality: N/A;  Diabetic - oral meds   RADIOACTIVE SEED GUIDED PARTIAL MASTECTOMY WITH AXILLARY SENTINEL LYMPH NODE BIOPSY Right 05/16/2017   Procedure: RIGHT RADIOACTIVE SEED GUIDED LUMPECTOMY  WITH  RIGHT BREAST SEED GUIDED EXCISIONAL BIOPSY AND RIGHT AXILLARY SENTINEL  NODE BIOPSY;  Surgeon: Emelia Loron, MD;  Location: [facility];  Service: General;  Laterality: Right;  2 SEEDS RIGHT BREAST  GENERAL AND PEC BLOCK ANESHTESIA    SOCIAL HISTORY: Social History   Socioeconomic History   Marital status: Married    Spouse name: Not on file   Number of children: Not on file   Years of education: Not on file   Highest education level: Not on file  Occupational History   Not on file  Tobacco Use   Smoking status: Former    Current packs/day: 0.00    Average packs/day: 0.3 packs/day for 5.0 years (1.3 ttl pk-yrs)    Types: Cigarettes    Start date: 07/24/1985    Quit date: 07/24/1990    Years since  quitting: 33.7   Smokeless tobacco: Never  Vaping Use   Vaping status: Never Used  Substance and Sexual Activity   Alcohol use: Yes    Comment: Socially   Drug use: No   Sexual activity: Not Currently  Other Topics Concern   Not on file  Social History Narrative   Lives in [place]. From [place]. Son lives with pt. Dog in home.      Work - [employer], and Interior and spatial designer, now retired.      Diet - regular      Exercise - no regular   Social Drivers of Health   Financial Resource Strain: Low Risk  (01/21/2024)   Overall Financial Resource Strain (CARDIA)    Difficulty of Paying Living Expenses: Not hard at all  Food Insecurity: No Food Insecurity (01/21/2024)   Hunger Vital Sign    Worried About Running Out of Food in the Last Year: Never true    Ran Out of Food in the Last Year: Never true  Transportation Needs:  No Transportation Needs (01/21/2024)   PRAPARE - Administrator, Civil Service (Medical): No    Lack of Transportation (Non-Medical): No  Physical Activity: Inactive (01/21/2024)   Exercise Vital Sign    Days of Exercise per Week: 0 days    Minutes of Exercise per Session: 0 min  Stress: No Stress Concern Present (01/21/2024)   [employer] of Occupational Health - Occupational Stress Questionnaire    Feeling of Stress : Not at all  Social Connections: Socially Integrated (01/21/2024)   Social Connection and Isolation Panel [NHANES]    Frequency of Communication with Friends and Family: More than three times a week    Frequency of Social Gatherings with Friends and Family: More than three times a week    Attends Religious Services: More than 4 times per year    Active Member of [employer] or Organizations: Yes    Attends Engineer, structural: More than 4 times per year    Marital Status: Married  Catering manager Violence: Not At Risk (01/21/2024)   Humiliation, Afraid, Rape, and Kick questionnaire    Fear of Current or Ex-Partner: No    Emotionally Abused:  No    Physically Abused: No    Sexually Abused: No    FAMILY HISTORY: Family History  Problem Relation Age of Onset   Hypertension Mother    Heart disease Father 91   Bone cancer Brother    Hypertension Daughter    Cancer Maternal Aunt 28       breast and ovary   Breast cancer Maternal Aunt    Pancreatic cancer Maternal Aunt    Lymphoma Maternal Aunt     Review of Systems  Constitutional:  Negative for appetite change, chills, fatigue, fever and unexpected weight change.  HENT:   Negative for hearing loss, lump/mass and trouble swallowing.   Eyes:  Negative for eye problems and icterus.  Respiratory:  Negative for chest tightness, cough and shortness of breath.   Cardiovascular:  Negative for chest pain, leg swelling and palpitations.  Gastrointestinal:  Negative for abdominal distention, abdominal pain, constipation, diarrhea, nausea and vomiting.  Endocrine: Negative for hot flashes.  Genitourinary:  Negative for difficulty urinating.   Musculoskeletal:  Negative for arthralgias.  Skin:  Negative for itching and rash.  Neurological:  Negative for dizziness, extremity weakness, headaches and numbness.  Hematological:  Negative for adenopathy. Does not bruise/bleed easily.  Psychiatric/Behavioral:  Negative for depression. The patient is not nervous/anxious.       PHYSICAL EXAMINATION    There were no vitals filed for this visit.  Physical Exam Constitutional:      General: She is not in acute distress.    Appearance: Normal appearance. She is not toxic-appearing.  HENT:     Head: Normocephalic and atraumatic.     Mouth/Throat:     Mouth: Mucous membranes are moist.     Pharynx: Oropharynx is clear. No oropharyngeal exudate or posterior oropharyngeal erythema.  Eyes:     General: No scleral icterus. Cardiovascular:     Rate and Rhythm: Normal rate and regular rhythm.     Pulses: Normal pulses.     Heart sounds: Normal heart sounds.  Pulmonary:     Effort:  Pulmonary effort is normal.     Breath sounds: Normal breath sounds.  Chest:     Comments: Right breast status postlumpectomy and radiation no sign of local recurrence left breast is benign. Abdominal:     General: Abdomen is  flat. Bowel sounds are normal. There is no distension.     Palpations: Abdomen is soft.     Tenderness: There is no abdominal tenderness.  Musculoskeletal:        General: No swelling.     Cervical back: Neck supple.  Lymphadenopathy:     Cervical: No cervical adenopathy.     Upper Body:     Right upper body: No supraclavicular or axillary adenopathy.     Left upper body: No supraclavicular or axillary adenopathy.  Skin:    General: Skin is warm and dry.     Findings: No rash.  Neurological:     General: No focal deficit present.     Mental Status: She is alert.  Psychiatric:        Mood and Affect: Mood normal.        Behavior: Behavior normal.       ASSESSMENT and THERAPY PLAN:   Malignant neoplasm of upper-outer quadrant of right breast in female, estrogen receptor positive (HCC) Martha [name] is a 73 year old woman with history of right sided breast invasive ductal carcinoma, ER/PR positive, HER2 negative, diagnosed in May 2028 status postlumpectomy, adjuvant radiation, and 5 years of anastrozole therapy that she completed in October 2023.  History of stage Ia breast cancer: She has no clinical or radiographic sign of breast cancer recurrence.  She will continue on observation alone.  She will repeat mammogram annually. Health maintenance: We reviewed healthy diet and exercise.  Her goal this year is to increase steps daily.  We discussed strategies that can help her achieve this goal. I recommended she continue follow-up with her primary care provider regularly for her preventative health care.  We will see Martha [name] back in a year for continued long-term follow-up.  She knows to call for any questions or concerns that may arise between now and then as we are  happy to see her much sooner if needed.     All questions were answered. The patient knows to call the clinic with any problems, questions or concerns. We can certainly see the patient much sooner if necessary.  Total encounter time:20 minutes*in face-to-face visit time, chart review, lab review, care coordination, order entry, and documentation of the encounter time.    Alwin Baars, NP 04/07/24 1:29 PM Medical Oncology and Hematology [facility] [address] [place], [place] [address] Tel. [phone]    Fax. [phone]  *Total Encounter Time as defined by the Centers for Medicare and Medicaid Services includes, in addition to the face-to-face time of a patient visit (documented in the note above) non-face-to-face time: obtaining and reviewing outside history, ordering and reviewing medications, tests or procedures, care coordination (communications with other health care professionals or caregivers) and documentation in the medical record.

## 2024-04-16 ENCOUNTER — Other Ambulatory Visit: Payer: Self-pay | Admitting: Internal Medicine

## 2024-05-06 DIAGNOSIS — L2089 Other atopic dermatitis: Secondary | ICD-10-CM | POA: Diagnosis not present

## 2024-06-16 ENCOUNTER — Encounter: Payer: Self-pay | Admitting: Internal Medicine

## 2024-06-16 NOTE — Telephone Encounter (Signed)
 Spoke with pt and scheduled her for a sooner appt and then scheduled her mother for an appt as well.

## 2024-06-24 ENCOUNTER — Telehealth: Payer: Self-pay

## 2024-06-24 NOTE — Telephone Encounter (Signed)
 Spoke with pt to let her know that we have received her patient assistance medication and it is ready for pick up.   Ozempic  2 mg: 4 boxes

## 2024-07-01 NOTE — Telephone Encounter (Signed)
Pt has picked up medication.  

## 2024-07-17 ENCOUNTER — Encounter: Payer: Self-pay | Admitting: Internal Medicine

## 2024-07-17 ENCOUNTER — Ambulatory Visit: Admitting: Internal Medicine

## 2024-07-17 VITALS — BP 102/70 | HR 82 | Temp 98.7°F | Ht 62.0 in | Wt 185.2 lb

## 2024-07-17 DIAGNOSIS — E66812 Obesity, class 2: Secondary | ICD-10-CM | POA: Diagnosis not present

## 2024-07-17 DIAGNOSIS — E1121 Type 2 diabetes mellitus with diabetic nephropathy: Secondary | ICD-10-CM

## 2024-07-17 DIAGNOSIS — I739 Peripheral vascular disease, unspecified: Secondary | ICD-10-CM

## 2024-07-17 DIAGNOSIS — R748 Abnormal levels of other serum enzymes: Secondary | ICD-10-CM | POA: Diagnosis not present

## 2024-07-17 DIAGNOSIS — E7800 Pure hypercholesterolemia, unspecified: Secondary | ICD-10-CM

## 2024-07-17 DIAGNOSIS — R5383 Other fatigue: Secondary | ICD-10-CM

## 2024-07-17 DIAGNOSIS — D649 Anemia, unspecified: Secondary | ICD-10-CM | POA: Diagnosis not present

## 2024-07-17 DIAGNOSIS — Z6836 Body mass index (BMI) 36.0-36.9, adult: Secondary | ICD-10-CM

## 2024-07-17 DIAGNOSIS — I10 Essential (primary) hypertension: Secondary | ICD-10-CM | POA: Diagnosis not present

## 2024-07-17 DIAGNOSIS — C50411 Malignant neoplasm of upper-outer quadrant of right female breast: Secondary | ICD-10-CM

## 2024-07-17 DIAGNOSIS — Z170 Estrogen receptor positive status [ER+]: Secondary | ICD-10-CM

## 2024-07-17 LAB — COMPREHENSIVE METABOLIC PANEL WITH GFR
ALT: 9 U/L (ref 0–35)
AST: 15 U/L (ref 0–37)
Albumin: 4.2 g/dL (ref 3.5–5.2)
Alkaline Phosphatase: 98 U/L (ref 39–117)
BUN: 20 mg/dL (ref 6–23)
CO2: 32 meq/L (ref 19–32)
Calcium: 9.6 mg/dL (ref 8.4–10.5)
Chloride: 100 meq/L (ref 96–112)
Creatinine, Ser: 0.94 mg/dL (ref 0.40–1.20)
GFR: 59.94 mL/min — ABNORMAL LOW (ref >60.00)
Glucose, Bld: 88 mg/dL (ref 70–99)
Potassium: 4.4 meq/L (ref 3.5–5.1)
Sodium: 138 meq/L (ref 135–145)
Total Bilirubin: 0.5 mg/dL (ref 0.2–1.2)
Total Protein: 7 g/dL (ref 6.0–8.3)

## 2024-07-17 LAB — CBC WITH DIFFERENTIAL/PLATELET
Basophils Absolute: 0 K/uL (ref 0.0–0.1)
Basophils Relative: 0.6 % (ref 0.0–3.0)
Eosinophils Absolute: 0.1 K/uL (ref 0.0–0.7)
Eosinophils Relative: 1.2 % (ref 0.0–5.0)
HCT: 38.2 % (ref 36.0–46.0)
Hemoglobin: 12.6 g/dL (ref 12.0–15.0)
Lymphocytes Relative: 37 % (ref 12.0–46.0)
Lymphs Abs: 2.3 K/uL (ref 0.7–4.0)
MCHC: 32.9 g/dL (ref 30.0–36.0)
MCV: 92.9 fl (ref 78.0–100.0)
Monocytes Absolute: 0.5 K/uL (ref 0.1–1.0)
Monocytes Relative: 8.1 % (ref 3.0–12.0)
Neutro Abs: 3.2 K/uL (ref 1.4–7.7)
Neutrophils Relative %: 53.1 % (ref 43.0–77.0)
Platelets: 339 K/uL (ref 150.0–400.0)
RBC: 4.11 Mil/uL (ref 3.87–5.11)
RDW: 13.9 % (ref 11.5–15.5)
WBC: 6.1 K/uL (ref 4.0–10.5)

## 2024-07-17 LAB — MICROALBUMIN / CREATININE URINE RATIO
Creatinine,U: 131.7 mg/dL
Microalb Creat Ratio: 44.4 mg/g — ABNORMAL HIGH (ref 0.0–30.0)
Microalb, Ur: 5.8 mg/dL — ABNORMAL HIGH (ref 0.0–1.9)

## 2024-07-17 LAB — TSH: TSH: 1.25 u[IU]/mL (ref 0.35–5.50)

## 2024-07-17 LAB — LIPID PANEL
Cholesterol: 166 mg/dL (ref 0–200)
HDL: 66.8 mg/dL (ref >39.00)
LDL Cholesterol: 83 mg/dL (ref 0–99)
NonHDL: 98.91
Total CHOL/HDL Ratio: 2
Triglycerides: 81 mg/dL (ref 0.0–149.0)
VLDL: 16.2 mg/dL (ref 0.0–40.0)

## 2024-07-17 LAB — LDL CHOLESTEROL, DIRECT: Direct LDL: 76 mg/dL

## 2024-07-17 LAB — HEMOGLOBIN A1C: Hgb A1c MFr Bld: 6.1 % (ref 4.6–6.5)

## 2024-07-17 MED ORDER — NYSTATIN 100000 UNIT/GM EX POWD: 1.0000 | Freq: Two times a day (BID) | CUTANEOUS | 3 refills | Status: AC

## 2024-07-17 MED ORDER — TIRZEPATIDE 10 MG/0.5ML ~~LOC~~ SOAJ: 10.0000 mg | SUBCUTANEOUS | 2 refills | Status: AC

## 2024-07-17 MED ORDER — TRIAMCINOLONE ACETONIDE 0.1 % EX CREA: TOPICAL_CREAM | Freq: Two times a day (BID) | CUTANEOUS | 1 refills | Status: DC

## 2024-07-17 MED ORDER — LUBIPROSTONE 8 MCG PO CAPS: 8.0000 ug | ORAL_CAPSULE | Freq: Two times a day (BID) | ORAL | 2 refills | Status: DC

## 2024-07-17 NOTE — Patient Instructions (Addendum)
 For the constipation:  Linzess and amitiza  are both available for IBS/constipation and chronic constipation.  The dose is lower for IBS  Linzess does not appear to be covered by your insurance,  but Amitiza  is.  Start with a trial of amitiza  at 8 mcg daily . You can  increase the dose  to 2 capsules daily if needed.  You should know if the dose will work after 3 days.  The maximum dose is 24 mcg twice daily , so let me know what dose works and I will refill at that dose    I am offering a Trial of mounjaro  instead of ozempic  , it it's affordable.  I Sent it to pharmacy.  YOUR starting dose will be the equivalen of the ozempmic dose of 2 mgf=  Take as you were doing the ozempic  and let us  know if we should cancel the ozempic    You need to exercise for 30 minutes daily  to burn calories/ .    Exercise: Anything that gets your heart beating faster counts! This means activities as simple as walking in the water ,  treading water ,  or water  aerobics . You can split up the 150 minutes however you want. For example, you could take a brisk, thirty minute walk five days a week. I also recommend that on two or more days a week that you do muscle-strengthening activities that work all major muscle groups.   Some insurance companies cover a program called Silver Sneakers. Silver Sneakers includes a free memberships to gyms, exercise classes and fitness groups. You can check your eligibility at [email or web address].      Healthy Choice low carb power bowl  entrees and  Steamer entrees are are great low carb entrees that microwave in 5 minutes

## 2024-07-17 NOTE — Assessment & Plan Note (Signed)
Patient was referred in August 2021 to [facility]  for evaluation of LE circulation due to claudication symptoms but declined the service  ?

## 2024-07-17 NOTE — Assessment & Plan Note (Addendum)
 Ozempic  start in 2023 :  wt loss has been unimpressive : 9 lbs with regain of 3  in the last several months .  Switching to Mounjaro  at starting dose of 10 mg weekly .  Strongly advised to exercise.

## 2024-07-17 NOTE — Assessment & Plan Note (Signed)
 Her  diabetes remains well controlled on max dose of  ozempic  , but her weight loss has been minimal..  encourage to  change her eating habits and start swimming or participating in some other tolerated exercise

## 2024-07-17 NOTE — Assessment & Plan Note (Signed)
 No recurrence by recent biopsy.  Continue annual screening by oncology for now

## 2024-07-17 NOTE — Assessment & Plan Note (Signed)
Well controlled on current regimen of losartan/hct . Renal function stable, no changes today.

## 2024-07-17 NOTE — Progress Notes (Signed)
 Subjective:  Patient ID: Martha [name], female    DOB: 11/26/1951  Age: 73 y.o. MRN: [id number]  CC: The primary encounter diagnosis was Class 2 severe obesity due to excess calories with serious comorbidity and body mass index (BMI) of 36.0 to 36.9 in adult [facility]). Diagnoses of Essential hypertension, benign, Pure hypercholesterolemia, Controlled type 2 diabetes mellitus with diabetic nephropathy, without long-term current use of insulin (HCC), Other fatigue, Anemia, unspecified type, Elevated alkaline phosphatase level, Malignant neoplasm of upper-outer quadrant of right breast in female, estrogen receptor positive (HCC), and Peripheral vascular disease of lower extremity (HCC) were also pertinent to this visit.   HPI Martha [name] presents for  Chief Complaint  Patient presents with   Medical Management of Chronic Issues    Pt here for F/U   1) TYPE 2 dm/OBESITY/HYPERTENSION:  has been taking OZEMPIC  2 MG WEEKLY .  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  Not exercising because of heel pain that was addressed by podiatry with a painful injection   2) constipation : averaging 2 BMs per week.  Using miralax daily in coffee, has stopped using it due to lack of effectiveness.   Tried sister's Linzess which worked.    3) left heel pain . Diagnosed with Achilles tendonitis by [facility].  Did not tolerate injection in heel. Still has a soft nodule on achilles tendon, tender to palpation   4)    Outpatient Medications Prior to Visit  Medication Sig Dispense Refill   Ascorbic Acid (VITAMIN C ) 1000 MG tablet Take 1 tablet (1,000 mg total) by mouth daily.     aspirin 81 MG tablet Take 81 mg by mouth daily.     atorvastatin  (LIPITOR) 20 MG tablet TAKE 1 TABLET (20 MG TOTAL) BY MOUTH EVERY OTHER DAY. 45 tablet 0   Blood Glucose Monitoring Suppl (TRUE METRIX AIR GLUCOSE METER) w/Device KIT Use to check blood sugars up to four times daily. 1 kit 0   cholecalciferol  (VITAMIN D ) 25 MCG (1000 UNIT) tablet Take 1,000 Units by mouth daily.     cyclobenzaprine  (FLEXERIL ) 10 MG tablet Take 1 tablet (10 mg total) by mouth 3 (three) times daily as needed for muscle spasms. 90 tablet 1   fluticasone  (FLONASE ) 50 MCG/ACT nasal spray SPRAY 2 SPRAYS INTO EACH NOSTRIL EVERY DAY 48 mL 0   glucose blood (TRUE METRIX BLOOD GLUCOSE TEST) test strip CHECK BLOOD SUGAR  UP  TO FOUR TIMES DAILY 300 strip 1   losartan -hydrochlorothiazide  (HYZAAR) 100-25 MG tablet TAKE 1 TABLET EVERY DAY 90 tablet 3   Semaglutide , 2 MG/DOSE, (OZEMPIC , 2 MG/DOSE,) 8 MG/3ML SOPN Inject 2 mg into the skin once a week. 3 mL 0   TRUEplus Lancets 33G MISC Use to check blood sugars up to four times daily. 400 each 3   triamcinolone  cream (KENALOG ) 0.1 % Apply topically 2 (two) times daily. For itchy rash 45 g 1   No facility-administered medications prior to visit.    Review of Systems;  Patient denies headache, fevers, malaise, unintentional weight loss, skin rash, eye pain, sinus congestion and sinus pain, sore throat, dysphagia,  hemoptysis , cough, dyspnea, wheezing, chest pain, palpitations, orthopnea, edema, abdominal pain, nausea, melena, diarrhea, constipation, flank pain, dysuria, hematuria, urinary  Frequency, nocturia, numbness, tingling, seizures,  Focal weakness, Loss of consciousness,  Tremor, insomnia, depression, anxiety, and suicidal ideation.      Objective:  BP 102/70 (BP Location:  Left Arm, Patient Position: Sitting, Cuff Size: Normal)   Pulse 82   Temp 98.7 F (37.1 C) (Oral)   Ht 5' 2 (1.575 m)   Wt 185 lb 3.2 oz (84 kg)   SpO2 99%   BMI 33.87 kg/m   BP Readings from Last 3 Encounters:  07/17/24 102/70  04/07/24 (!) 137/54  09/25/23 116/60    Wt Readings from Last 3 Encounters:  07/17/24 185 lb 3.2 oz (84 kg)  04/07/24 185 lb 8 oz (84.1 kg)  01/21/24 182 lb (82.6 kg)    Physical Exam Vitals reviewed.  Constitutional:      General: Martha [name] is not in acute  distress.    Appearance: Normal appearance. Martha [name] is obese. Martha [name] is not ill-appearing, toxic-appearing or diaphoretic.  HENT:     Head: Normocephalic.  Eyes:     General: No scleral icterus.       Right eye: No discharge.        Left eye: No discharge.     Conjunctiva/sclera: Conjunctivae normal.  Cardiovascular:     Rate and Rhythm: Normal rate and regular rhythm.     Heart sounds: Normal heart sounds.  Pulmonary:     Effort: Pulmonary effort is normal. No respiratory distress.     Breath sounds: Normal breath sounds.  Musculoskeletal:        General: Normal range of motion.  Skin:    General: Skin is warm and dry.  Neurological:     General: No focal deficit present.     Mental Status: Martha [name] is alert and oriented to person, place, and time. Mental status is at baseline.  Psychiatric:        Mood and Affect: Mood normal.        Behavior: Behavior normal.        Thought Content: Thought content normal.        Judgment: Judgment normal.     Lab Results  Component Value Date   HGBA1C 6.1 07/17/2024   HGBA1C 6.1 01/23/2024   HGBA1C 6.0 07/30/2023    Lab Results  Component Value Date   CREATININE 0.94 07/17/2024   CREATININE 0.96 02/18/2024   CREATININE 1.15 03/19/2023    Lab Results  Component Value Date   WBC 6.1 07/17/2024   HGB 12.6 07/17/2024   HCT 38.2 07/17/2024   PLT 339.0 07/17/2024   GLUCOSE 88 07/17/2024   CHOL 166 07/17/2024   TRIG 81.0 07/17/2024   HDL 66.80 07/17/2024   LDLDIRECT 76.0 07/17/2024   LDLCALC 83 07/17/2024   ALT 9 07/17/2024   AST 15 07/17/2024   NA 138 07/17/2024   K 4.4 07/17/2024   CL 100 07/17/2024   CREATININE 0.94 07/17/2024   BUN 20 07/17/2024   CO2 32 07/17/2024   TSH 1.25 07/17/2024   HGBA1C 6.1 07/17/2024   MICROALBUR 5.8 (H) 07/17/2024    MM RT BREAST BX W LOC DEV 1ST LESION IMAGE BX SPEC STEREO GUIDE Addendum Date: 04/01/2024 ADDENDUM REPORT: 04/01/2024 11:38 ADDENDUM: 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 This was found to be concordant by Dr. Toribio Agreste. Pathology results were discussed with the patient by telephone. The patient reported doing well after the biopsy with tenderness at the site. Post biopsy instructions and care were reviewed and questions were answered. The patient was encouraged to call [facility] for any additional concerns. My direct phone  number was provided. The patient was instructed to return for annual screening mammography. Pathology results reported by Hendricks Benders, RN on 04/01/2024. Electronically Signed   By: Toribio Agreste M.D.   On: 04/01/2024 11:38   Result Date: 04/01/2024 CLINICAL DATA:  Patient presents for stereotactic core needle biopsy of indeterminate microcalcifications over the prior lumpectomy site over the anterior right upper outer quadrant. EXAM: RIGHT BREAST STEREOTACTIC CORE NEEDLE BIOPSY COMPARISON:  Previous exam(s). FINDINGS: The patient and I discussed the procedure of stereotactic-guided biopsy including benefits and alternatives. We discussed the high likelihood of a successful procedure. We discussed the risks of the procedure including infection, bleeding, tissue injury, clip migration, and inadequate sampling. Informed written consent was given. The usual time out protocol was performed immediately prior to the procedure. Using sterile technique and 1% Lidocaine  as local anesthetic, under stereotactic guidance, a 9 gauge vacuum assisted device was used to perform core needle biopsy of the targeted microcalcifications over the anterior right upper outer quadrant at the prior lumpectomy site using a superior to inferior approach. Specimen radiograph was performed showing a few of the targeted microcalcifications. Specimens with calcifications are identified for pathology. Lesion quadrant: Right upper outer quadrant. At the conclusion of the  procedure, an X shaped tissue marker clip was deployed into the biopsy cavity. Follow-up 2-view mammogram was performed and dictated separately. IMPRESSION: Stereotactic-guided biopsy of indeterminate right breast microcalcifications. No apparent complications. Electronically Signed: By: Toribio Agreste M.D. On: 03/31/2024 11:02   MM CLIP PLACEMENT RIGHT Result Date: 03/31/2024 CLINICAL DATA:  Patient is post stereotactic core needle biopsy of the group of microcalcifications over the lumpectomy site of the anterior right upper outer quadrant. EXAM: 3D DIAGNOSTIC RIGHT MAMMOGRAM POST STEREOTACTIC BIOPSY COMPARISON:  Previous exam(s). ACR Breast Density Category b: There are scattered areas of fibroglandular density. FINDINGS: 3D Mammographic images were obtained following stereotactic guided biopsy of the targeted microcalcifications over the anterior right upper outer lumpectomy site. The biopsy marking clip is in expected position at the site of biopsy. IMPRESSION: Appropriate positioning of the X shaped biopsy marking clip at the site of biopsy in the anterior right upper outer quadrant. Final Assessment: Post Procedure Mammograms for Marker Placement Electronically Signed   By: Toribio Agreste M.D.   On: 03/31/2024 11:06    Assessment & Plan:  .Class 2 severe obesity due to excess calories with serious comorbidity and body mass index (BMI) of 36.0 to 36.9 in adult [facility]) Assessment & Plan:  Ozempic  start in 2023 :  wt loss has been unimpressive : 9 lbs with regain of 3  in the last several months .  Switching to Mounjaro  at starting dose of 10 mg weekly .  Strongly advised to exercise.    Essential hypertension, benign Assessment & Plan: Well controlled on current regimen of losartan /hct. Renal function stable, no changes today.   Orders: -     Comprehensive metabolic panel with GFR  Pure hypercholesterolemia -     LDL cholesterol, direct -     Lipid panel  Controlled type 2 diabetes mellitus  with diabetic nephropathy, without long-term current use of insulin (HCC) Assessment & Plan: Her  diabetes remains well controlled on max dose of  ozempic  , but her weight loss has been minimal..  encourage to  change her eating habits and start swimming or participating in some other tolerated exercise   Orders: -     Hemoglobin A1c -     Microalbumin / creatinine urine ratio  Other fatigue -     TSH  Anemia, unspecified type -     CBC with Differential/Platelet  Elevated alkaline phosphatase level  Malignant neoplasm of upper-outer quadrant of right breast in female, estrogen receptor positive (HCC) Assessment & Plan: No recurrence by recent biopsy.  Continue annual screening by oncology for now    Peripheral vascular disease of lower extremity [facility]) Assessment & Plan: Patient was referred in August 2021 to [facility]  for evaluation of LE circulation due to claudication symptoms but declined the service    Other orders -     Lubiprostone ; Take 1 capsule (8 mcg total) by mouth 2 (two) times daily with a meal.  Dispense: 60 capsule; Refill: 2 -     Tirzepatide ; Inject 10 mg into the skin once a week.  Dispense: 2 mL; Refill: 2 -     Triamcinolone  Acetonide; Apply topically 2 (two) times daily. For itchy rash  Dispense: 45 g; Refill: 1 -     Nystatin ; Apply 1 Application topically 2 (two) times daily. To rash until resolved.  Dispense: 45 g; Refill: 3     I spent 34 minutes on the day of this face to face encounter reviewing patient's  most recent visit with oncology, podiatry ,  prior relevant surgical and non surgical procedures, recent  labs and imaging studies, counseling on weight management,  reviewing the assessment and plan with patient, and post visit ordering and reviewing of  diagnostics and therapeutics with patient  .   Follow-up: Return in about 6 months (around 01/17/2025).   Verneita LITTIE Kettering, MD

## 2024-07-20 ENCOUNTER — Ambulatory Visit: Payer: Self-pay | Admitting: Internal Medicine

## 2024-07-21 ENCOUNTER — Other Ambulatory Visit (HOSPITAL_COMMUNITY): Payer: Self-pay

## 2024-07-21 ENCOUNTER — Telehealth: Payer: Self-pay

## 2024-07-21 NOTE — Telephone Encounter (Signed)
 Pharmacy Patient Advocate Encounter  Received notification from [employer] that Prior Authorization for [facility] has been APPROVED from 12/26/23 to 12/24/24  PA #/Case ID/Reference #: [id number]

## 2024-07-21 NOTE — Telephone Encounter (Signed)
 Pharmacy Patient Advocate Encounter   Received notification from CoverMyMeds that prior authorization for Nyamyc  100000 UNIT/GM powder is required/requested.   Insurance verification completed.   The patient is insured through [employer] .   Per test claim: PA required; PA submitted to above mentioned insurance via CoverMyMeds Key/confirmation #/EOC [id number] Status is pending

## 2024-07-22 ENCOUNTER — Other Ambulatory Visit (HOSPITAL_COMMUNITY): Payer: Self-pay

## 2024-07-28 ENCOUNTER — Ambulatory Visit: Admitting: Internal Medicine

## 2024-08-19 ENCOUNTER — Other Ambulatory Visit: Payer: Self-pay | Admitting: Internal Medicine

## 2024-09-02 ENCOUNTER — Encounter: Payer: Self-pay | Admitting: Internal Medicine

## 2024-09-03 MED ORDER — LUBIPROSTONE 24 MCG PO CAPS: 24.0000 ug | ORAL_CAPSULE | Freq: Two times a day (BID) | ORAL | 3 refills | Status: AC

## 2024-09-07 ENCOUNTER — Other Ambulatory Visit: Payer: Self-pay | Admitting: Internal Medicine

## 2024-09-22 DIAGNOSIS — H26491 Other secondary cataract, right eye: Secondary | ICD-10-CM | POA: Diagnosis not present

## 2024-09-22 DIAGNOSIS — H35372 Puckering of macula, left eye: Secondary | ICD-10-CM | POA: Diagnosis not present

## 2024-09-22 DIAGNOSIS — E119 Type 2 diabetes mellitus without complications: Secondary | ICD-10-CM | POA: Diagnosis not present

## 2024-09-22 DIAGNOSIS — H11131 Conjunctival pigmentations, right eye: Secondary | ICD-10-CM | POA: Diagnosis not present

## 2024-09-22 LAB — OPHTHALMOLOGY REPORT-SCANNED

## 2024-09-29 ENCOUNTER — Telehealth: Payer: Self-pay

## 2024-09-29 NOTE — Telephone Encounter (Signed)
 Its not a virtual visit she is coming in she just schedule the appt through her mychart.

## 2024-09-29 NOTE — Telephone Encounter (Signed)
 fyi

## 2024-09-29 NOTE — Telephone Encounter (Signed)
 Patient scheduled an appointment with Dr. Verneita Kettering via MyChart for 10/02/2024 with the following comment:  Cyst on hand, issue with left side some days ago/Patient wants to see Dr. Kettering only

## 2024-10-02 ENCOUNTER — Ambulatory Visit (INDEPENDENT_AMBULATORY_CARE_PROVIDER_SITE_OTHER): Admitting: Internal Medicine

## 2024-10-02 ENCOUNTER — Encounter: Payer: Self-pay | Admitting: Internal Medicine

## 2024-10-02 VITALS — BP 132/66 | HR 75 | Ht 62.0 in | Wt 185.4 lb

## 2024-10-02 DIAGNOSIS — Z87898 Personal history of other specified conditions: Secondary | ICD-10-CM

## 2024-10-02 DIAGNOSIS — K5730 Diverticulosis of large intestine without perforation or abscess without bleeding: Secondary | ICD-10-CM

## 2024-10-02 DIAGNOSIS — M67432 Ganglion, left wrist: Secondary | ICD-10-CM | POA: Diagnosis not present

## 2024-10-02 NOTE — Progress Notes (Signed)
 Subjective:  Patient ID: Martha [name], female    DOB: 1951/09/12  Age: 73 y.o. MRN: [id number]  CC: The primary encounter diagnosis was Ganglion cyst of dorsum of left wrist. Diagnoses of History of abdominal pain and Diverticulosis of colon without diverticulitis were also pertinent to this visit.   HPI Martha [name] presents for  Chief Complaint  Patient presents with   Cyst    Cyst on left hand   Abdominal Pain    On Sunday after eating pt laid down for a little bit when she went to get up she had this sharp pain in her left lower abdomen. Pt thought maybe it was from taking the Amitiza  so she stopped taking it. Pt stated that the pain lasted for 3 days. She used a heating pad which help with the pain some. She went to a liquid diet until last night. Last night she had shrimp and string beans. So instead of using the Amitiza  she has started putting miralax in her coffee in the morning, it has helped some but not    1) CYST;  PAINLESS  BASE OF LEFT MCP JOINT   2) LLQ PAIN : TENDER TO PRESSURE STARTED TWO SUNDAYS AGO. RESOLVED AFTER 3 DAYS.  STOPPED AMITIZA  AFTER 6 DAYS .   PAIN HAS NOT RETURN  LAST DOSE OF AMITIZA  WAS SATURDAY NIGHT  . NO FEVER OR NAUSEA.  HAD BEEN HAVING  ONCE DAILY BLOWOUTS SINCE STARTING AMITIZA   AT LAST 4 WEEKS AGO.   PAIN  WAS CONSTANT AND SEVERE/SHARP .  STARTED AFTER A BIG MEAL.  NO BLOOD IN BM OR BLADDER   HISTORY OF DIVERTICULOSIS BY PRIOR COLONOSCOPY >  12 YEARS AGO ,  with no historr of diverticulitis     Outpatient Medications Prior to Visit  Medication Sig Dispense Refill   Ascorbic Acid (VITAMIN C ) 1000 MG tablet Take 1 tablet (1,000 mg total) by mouth daily.     aspirin 81 MG tablet Take 81 mg by mouth daily.     atorvastatin  (LIPITOR) 20 MG tablet TAKE 1 TABLET EVERY OTHER DAY 45 tablet 1   Blood Glucose Monitoring Suppl (TRUE METRIX AIR GLUCOSE METER) w/Device KIT Use to check blood sugars up to four times daily. 1 kit 0   cholecalciferol  (VITAMIN D ) 25 MCG (1000 UNIT) tablet Take 1,000 Units by mouth daily.     cyclobenzaprine  (FLEXERIL ) 10 MG tablet Take 1 tablet (10 mg total) by mouth 3 (three) times daily as needed for muscle spasms. 90 tablet 1   fluticasone  (FLONASE ) 50 MCG/ACT nasal spray SPRAY 2 SPRAYS INTO EACH NOSTRIL EVERY DAY 48 mL 0   glucose blood (TRUE METRIX BLOOD GLUCOSE TEST) test strip CHECK BLOOD SUGAR  UP  TO FOUR TIMES DAILY 300 strip 1   losartan -hydrochlorothiazide  (HYZAAR) 100-25 MG tablet TAKE 1 TABLET EVERY DAY 90 tablet 1   nystatin  (MYCOSTATIN /NYSTOP ) powder Apply 1 Application topically 2 (two) times daily. To rash until resolved. 45 g 3   Semaglutide , 2 MG/DOSE, (OZEMPIC , 2 MG/DOSE,) 8 MG/3ML SOPN Inject 2 mg into the skin once a week. 3 mL 0   triamcinolone  cream (KENALOG ) 0.1 % Apply topically 2 (two) times daily. For itchy rash 45 g 1   TRUEplus Lancets 33G MISC Use to check blood sugars up to four times daily. 400 each 3   lubiprostone  (AMITIZA ) 24 MCG capsule Take 1 capsule (24 mcg total) by mouth 2 (two) times daily with a meal. (Patient not  taking: Reported on 10/02/2024) 60 capsule 3   tirzepatide  (MOUNJARO ) 10 MG/0.5ML Pen Inject 10 mg into the skin once a week. (Patient not taking: Reported on 10/02/2024) 2 mL 2   No facility-administered medications prior to visit.    Review of Systems;  Patient denies headache, fevers, malaise, unintentional weight loss, skin rash, eye pain, sinus congestion and sinus pain, sore throat, dysphagia,  hemoptysis , cough, dyspnea, wheezing, chest pain, palpitations, orthopnea, edema, abdominal pain, nausea, melena, diarrhea, constipation, flank pain, dysuria, hematuria, urinary  Frequency, nocturia, numbness, tingling, seizures,  Focal weakness, Loss of consciousness,  Tremor, insomnia, depression, anxiety, and suicidal ideation.      Objective:  BP 132/66   Pulse 75   Ht 5' 2 (1.575 m)   Wt 185 lb 6.4 oz (84.1 kg)   SpO2 97%   BMI 33.91 kg/m   BP  Readings from Last 3 Encounters:  10/02/24 132/66  07/17/24 102/70  04/07/24 (!) 137/54    Wt Readings from Last 3 Encounters:  10/02/24 185 lb 6.4 oz (84.1 kg)  07/17/24 185 lb 3.2 oz (84 kg)  04/07/24 185 lb 8 oz (84.1 kg)    Physical Exam Vitals reviewed.  Constitutional:      General: She is not in acute distress.    Appearance: Normal appearance. She is normal weight. She is not ill-appearing, toxic-appearing or diaphoretic.  HENT:     Head: Normocephalic.  Eyes:     General: No scleral icterus.       Right eye: No discharge.        Left eye: No discharge.     Conjunctiva/sclera: Conjunctivae normal.  Cardiovascular:     Rate and Rhythm: Normal rate and regular rhythm.     Heart sounds: Normal heart sounds.  Pulmonary:     Effort: Pulmonary effort is normal. No respiratory distress.     Breath sounds: Normal breath sounds.  Abdominal:     General: Bowel sounds are normal. There is no distension.     Tenderness: There is no abdominal tenderness.  Musculoskeletal:        General: Normal range of motion.  Skin:    General: Skin is warm and dry.  Neurological:     General: No focal deficit present.     Mental Status: She is alert and oriented to person, place, and time. Mental status is at baseline.  Psychiatric:        Mood and Affect: Mood normal.        Behavior: Behavior normal.        Thought Content: Thought content normal.        Judgment: Judgment normal.     Lab Results  Component Value Date   HGBA1C 6.1 07/17/2024   HGBA1C 6.1 01/23/2024   HGBA1C 6.0 07/30/2023    Lab Results  Component Value Date   CREATININE 0.94 07/17/2024   CREATININE 0.96 02/18/2024   CREATININE 1.15 03/19/2023    Lab Results  Component Value Date   WBC 6.1 07/17/2024   HGB 12.6 07/17/2024   HCT 38.2 07/17/2024   PLT 339.0 07/17/2024   GLUCOSE 88 07/17/2024   CHOL 166 07/17/2024   TRIG 81.0 07/17/2024   HDL 66.80 07/17/2024   LDLDIRECT 76.0 07/17/2024   LDLCALC  83 07/17/2024   ALT 9 07/17/2024   AST 15 07/17/2024   NA 138 07/17/2024   K 4.4 07/17/2024   CL 100 07/17/2024   CREATININE 0.94 07/17/2024   BUN 20  07/17/2024   CO2 32 07/17/2024   TSH 1.25 07/17/2024   HGBA1C 6.1 07/17/2024   MICROALBUR 5.8 (H) 07/17/2024     Assessment & Plan:  .Ganglion cyst of dorsum of left wrist Assessment & Plan: Management deferred as it is not painful or  interfering with function of ha nd   History of abdominal pain Assessment & Plan: Resolved , may have been diverticulitis,  but resolved spontaneously .  Advised to notify Y me of next occurrence so  an abd CT can be ordered . Reviewed laxative choices to manage constipation   Diverticulosis of colon without diverticulitis Assessment & Plan: Encouraged to follow a high fiber diet and use bulk fomring laxatives and colace to maintain regular movements      Follow-up: No follow-ups on file.   Verneita LITTIE Kettering, MD

## 2024-10-02 NOTE — Patient Instructions (Addendum)
 A high fiber diet is still considered the best diet for managing diverticulosis   TRY THE FOLLOWING COMBINATIONS to manage your constipation:   BENEFIBER  PLUS  COLACE  OR   MIRALAX PLUS COLACE    CALL ME THE NEXT TIME YOU HAVE THIS PAIN   Leave the cyst alone.  It's a ganglionic cyst and they can enlarge or shrink.  Try icing it

## 2024-10-03 DIAGNOSIS — H26491 Other secondary cataract, right eye: Secondary | ICD-10-CM | POA: Diagnosis not present

## 2024-10-03 NOTE — Telephone Encounter (Signed)
 Copied from CRM [id number]. Topic: General - Other >> Oct 03, 2024  2:11 PM Martha [name] wrote: Reason for CRM: patient stated she was returning a call [phone]

## 2024-10-03 NOTE — Telephone Encounter (Addendum)
 I do not see where anyone called her. I'll send her a mychart message

## 2024-10-04 DIAGNOSIS — K5730 Diverticulosis of large intestine without perforation or abscess without bleeding: Secondary | ICD-10-CM | POA: Insufficient documentation

## 2024-10-04 DIAGNOSIS — M67432 Ganglion, left wrist: Secondary | ICD-10-CM | POA: Insufficient documentation

## 2024-10-04 DIAGNOSIS — Z87898 Personal history of other specified conditions: Secondary | ICD-10-CM | POA: Insufficient documentation

## 2024-10-04 NOTE — Assessment & Plan Note (Signed)
 Encouraged to follow a high fiber diet and use bulk fomring laxatives and colace to maintain regular movements

## 2024-10-04 NOTE — Assessment & Plan Note (Addendum)
 Resolved , may have been diverticulitis,  but resolved spontaneously .  Advised to notify Y me of next occurrence so  an abd CT can be ordered . Reviewed laxative choices to manage constipation

## 2024-10-04 NOTE — Assessment & Plan Note (Signed)
 Management deferred as it is not painful or  interfering with function of ha nd

## 2024-10-10 ENCOUNTER — Telehealth: Payer: Self-pay

## 2024-10-10 NOTE — Telephone Encounter (Signed)
 noted

## 2024-10-10 NOTE — Telephone Encounter (Unsigned)
 Copied from CRM [id number]. Topic: Clinical - Medication Question >> Oct 10, 2024  5:27 PM Dedra B wrote: Reason for CRM: Pt returning call for [facility] regarding ozempic . Relayed message verbatim. Pt will pick up Monday.

## 2024-10-10 NOTE — Telephone Encounter (Signed)
 LMTCB. Need to let pt know that we have received her pt assistance medication and it is ready for pick up.   Ozempic  2 mg: 3 boxes

## 2024-10-13 NOTE — Telephone Encounter (Signed)
 Meds handed to pt on today after ID verification

## 2024-10-18 ENCOUNTER — Encounter: Payer: Self-pay | Admitting: Internal Medicine

## 2024-10-24 DIAGNOSIS — H26491 Other secondary cataract, right eye: Secondary | ICD-10-CM | POA: Diagnosis not present

## 2024-10-28 ENCOUNTER — Ambulatory Visit: Admitting: Internal Medicine

## 2024-11-04 DIAGNOSIS — M1990 Unspecified osteoarthritis, unspecified site: Secondary | ICD-10-CM | POA: Diagnosis not present

## 2024-11-04 DIAGNOSIS — I129 Hypertensive chronic kidney disease with stage 1 through stage 4 chronic kidney disease, or unspecified chronic kidney disease: Secondary | ICD-10-CM | POA: Diagnosis not present

## 2024-11-04 DIAGNOSIS — N189 Chronic kidney disease, unspecified: Secondary | ICD-10-CM | POA: Diagnosis not present

## 2024-11-04 DIAGNOSIS — Z853 Personal history of malignant neoplasm of breast: Secondary | ICD-10-CM | POA: Diagnosis not present

## 2024-11-04 DIAGNOSIS — Z7982 Long term (current) use of aspirin: Secondary | ICD-10-CM | POA: Diagnosis not present

## 2024-11-04 DIAGNOSIS — E1122 Type 2 diabetes mellitus with diabetic chronic kidney disease: Secondary | ICD-10-CM | POA: Diagnosis not present

## 2024-11-04 DIAGNOSIS — E785 Hyperlipidemia, unspecified: Secondary | ICD-10-CM | POA: Diagnosis not present

## 2024-11-04 DIAGNOSIS — M3500 Sicca syndrome, unspecified: Secondary | ICD-10-CM | POA: Diagnosis not present

## 2024-11-04 DIAGNOSIS — Z833 Family history of diabetes mellitus: Secondary | ICD-10-CM | POA: Diagnosis not present

## 2024-11-04 DIAGNOSIS — E1151 Type 2 diabetes mellitus with diabetic peripheral angiopathy without gangrene: Secondary | ICD-10-CM | POA: Diagnosis not present

## 2024-11-04 DIAGNOSIS — Z8249 Family history of ischemic heart disease and other diseases of the circulatory system: Secondary | ICD-10-CM | POA: Diagnosis not present

## 2024-12-29 DIAGNOSIS — I10 Essential (primary) hypertension: Secondary | ICD-10-CM

## 2024-12-29 DIAGNOSIS — E1121 Type 2 diabetes mellitus with diabetic nephropathy: Secondary | ICD-10-CM

## 2024-12-29 DIAGNOSIS — R5383 Other fatigue: Secondary | ICD-10-CM

## 2024-12-29 DIAGNOSIS — E7800 Pure hypercholesterolemia, unspecified: Secondary | ICD-10-CM

## 2025-01-02 ENCOUNTER — Other Ambulatory Visit

## 2025-01-05 ENCOUNTER — Other Ambulatory Visit: Payer: Self-pay | Admitting: Internal Medicine

## 2025-01-15 ENCOUNTER — Telehealth: Payer: Self-pay

## 2025-01-15 NOTE — Telephone Encounter (Signed)
 Copied from CRM #[id number]. Topic: General - Other >> Jan 15, 2025  9:31 AM Antony RAMAN wrote: Reason for CRM: requesting harlene the doctors nurse to call patient back about medical records  [phone]

## 2025-01-16 NOTE — Telephone Encounter (Signed)
 Left message for patient to give our office a call back to discuss her concerns about medical records. Gather more information in regards to what she needs for medical records.  OK for [id number] to gather more information in regards to patients concerns about medical records. If gathered, please notify the office.

## 2025-01-16 NOTE — Telephone Encounter (Signed)
 Copied from CRM #[id number]. Topic: General - Other >> Jan 15, 2025  9:31 AM Antony RAMAN wrote: Reason for CRM: requesting harlene the doctors nurse to call patient back about medical records  [phone]

## 2025-01-16 NOTE — Telephone Encounter (Signed)
 Copied from CRM #[id number]. Topic: General - Other >> Jan 15, 2025  9:31 AM Antony RAMAN wrote: Reason for CRM: requesting harlene the doctors nurse to call patient back about medical records  [phone]

## 2025-01-20 NOTE — Telephone Encounter (Signed)
 Pt was not inquiring about medical records for herself. She was calling to check on the letter that she requested for her mother.

## 2025-01-26 ENCOUNTER — Ambulatory Visit: Payer: Medicare HMO

## 2025-04-07 ENCOUNTER — Inpatient Hospital Stay

## 2025-04-07 ENCOUNTER — Encounter: Admitting: Adult Health

## 2025-04-13 ENCOUNTER — Ambulatory Visit
